# Patient Record
Sex: Male | Born: 1955
Health system: Southern US, Community
[De-identification: ages and names within clinical notes are randomized; demographics above are authoritative.]

## PROBLEM LIST (undated history)

## (undated) DIAGNOSIS — I10 Essential (primary) hypertension: Secondary | ICD-10-CM

## (undated) DIAGNOSIS — E785 Hyperlipidemia, unspecified: Secondary | ICD-10-CM

## (undated) DIAGNOSIS — I255 Ischemic cardiomyopathy: Secondary | ICD-10-CM

## (undated) DIAGNOSIS — J42 Unspecified chronic bronchitis: Secondary | ICD-10-CM

## (undated) DIAGNOSIS — G4733 Obstructive sleep apnea (adult) (pediatric): Secondary | ICD-10-CM

## (undated) DIAGNOSIS — I2510 Atherosclerotic heart disease of native coronary artery without angina pectoris: Secondary | ICD-10-CM

## (undated) DIAGNOSIS — G2581 Restless legs syndrome: Secondary | ICD-10-CM

## (undated) DIAGNOSIS — E119 Type 2 diabetes mellitus without complications: Secondary | ICD-10-CM

## (undated) DIAGNOSIS — I509 Heart failure, unspecified: Secondary | ICD-10-CM

## (undated) HISTORY — PX: TONSILLECTOMY AND ADENOIDECTOMY: SUR1326

## (undated) HISTORY — DX: Atherosclerotic heart disease of native coronary artery without angina pectoris: I25.10

## (undated) HISTORY — PX: OTHER SURGICAL HISTORY: SHX169

## (undated) HISTORY — DX: Obstructive sleep apnea (adult) (pediatric): G47.33

## (undated) HISTORY — PX: CYSTECTOMY: SUR359

## (undated) HISTORY — DX: Hyperlipidemia, unspecified: E78.5

## (undated) HISTORY — DX: Unspecified chronic bronchitis: J42

## (undated) HISTORY — DX: Ischemic cardiomyopathy: I25.5

## (undated) HISTORY — DX: Restless legs syndrome: G25.81

---

## 2005-11-14 ENCOUNTER — Inpatient Hospital Stay (HOSPITAL_COMMUNITY): Admission: EM | Admit: 2005-11-14 | Discharge: 2005-11-18 | Payer: Self-pay | Admitting: Emergency Medicine

## 2007-08-22 ENCOUNTER — Emergency Department (HOSPITAL_COMMUNITY): Admission: EM | Admit: 2007-08-22 | Discharge: 2007-08-22 | Payer: Self-pay | Admitting: Emergency Medicine

## 2011-01-03 NOTE — Cardiovascular Report (Signed)
NAMEWALFRED, BETTENDORF NO.:  [id number]   MEDICAL RECORD NO.:  [id number]          PATIENT TYPE:  INP   LOCATION:  2905                         FACILITY:  [facility]   PHYSICIAN:  Eduardo Osier. Sharyn Lull, M.D. DATE OF BIRTH:  11/04/1955   DATE OF PROCEDURE:  11/14/2005  DATE OF DISCHARGE:                              CARDIAC CATHETERIZATION   PROCEDURES:  1.  Left cardiac cath with selective left and right coronary angiography,      LV graphy via right groin using Judkins technique.  2.  Attempted PTCA to 100% occluded RCA.   INDICATIONS FOR PROCEDURE:  Mr. Costlow is a 55 year old white male with past  medical history significant for tobacco abuse, positive strong family  history of coronary artery disease.  He came to the ER complaining of  retrosternal chest tightness radiating to the throat grade 7/10 associated  with mild shortness of breath.  EKG done in the ER showed normal sinus  rhythm with ST-T wave abnormality in inferolateral leads and was noted to  have elevated CPK-MB and troponin I consistent with acute non-Q-wave  myocardial infarction.  Denies recent such episodes of chest pain but states  had similar chest pain in 2004 but did not seek any medical attention.  Due  to typical anginal chest pain, elevated CPK-MB and troponin I and ST-T wave  changes suggestive of non-Q-wave myocardial infarction, discussed with the  patient and his wife regarding emergency left cath, possible PTCA stenting;  its risks and benefits i.e. death, MI, stroke, need for emergency CABG, risk  of restenosis, local vascular complications, etc. and consented for the  procedure well.   PROCEDURE:  After obtaining informed consent, the patient was brought to the  cath lab and was placed on fluoroscopy table.  The right groin was prepped  and draped in usual fashion.  2% Xylocaine was used for local anesthesia in  the right groin.  With the help of thin-wall needle, a 7-French arterial  and  6-French venous sheaths were placed.  Both the sheaths were aspirated and  flushed.  Next, 6-French left Judkins catheter was advanced over the wire  under fluoroscopic guidance up to the ascending aorta.  Wire was pulled out,  the catheter was aspirated and connected to the manifold.  Catheter was  further advanced and engaged into left coronary ostium.  Multiple views of  the left system were taken.  Next, the catheter was disengaged and was  pulled out over the wire and was replaced with 6-French right Judkins  catheter which was advanced over the wire under fluoroscopic guidance up to  the ascending aorta.  Wire was pulled out, the catheter was aspirated and  connected to the manifold.  Catheter was further advanced and engaged into  right coronary ostium.  Multiple views of the right system were taken.  Next, the catheter was disengaged and was pulled out over the wire and was  replaced with 6-French pigtail catheter which was advanced over the wire  under fluoroscopic guidance to the ascending aorta.  Wire was pulled out,  the catheter was aspirated  and connected to the manifold.  Catheter was  further advanced across aortic valve into the LV.  LV pressures were  recorded.  Next, LV gram was done in 30 degree RAO position.  Post  angiographic pressures were recorded from LV and then pullback pressures  were recorded from the aorta.  There was no gradient across the aortic  valve.  The pigtail catheter was pulled out over the wire.  Sheaths were  aspirated and flushed.   FINDINGS:  LV showed inferobasal wall hypokinesia, EF of 50% approximately.  Left main was patent.  LAD has 20-30% proximal and 20-25% mid-sequential  stenosis.  Vessel is ectatic.  Diagonal one is small which is patent.  Left  circumflex has 40-50% proximal stenosis with large ectatic midportion.  OM-1  is very small.  OM-2 is large which is ectatic.  The proximal portion has 15-  20% proximal stenosis.  OM-3  is small which is patent.  RCA is 100% occluded  proximally which is filling by collaterals from the left system.  Distal RCA  also appears to be ectatic and filling from collaterals from RCA and from  septal branches from the LAD.   INTERVENTIONAL PROCEDURE:  Attempted to cross the totally occluded RCA using  multiple wires, i.e. 0.014 Hi-Torque floppy the choice PT, BMW support wire  and Crossit wire without success.  RCA lesion appears to be chronically  occluded and appears to be hard and fibrotic.  The patient received weight-  based heparin, Integrilin and Plavix during the procedure.  The patient  tolerated the procedure well.  There were no complications.  The patient was  transferred to recovery room in stable condition.           ______________________________  Eduardo Osier Sharyn Lull, M.D.     MNH/MEDQ  D:  11/14/2005  T:  11/16/2005  Job:  [id number]   cc:   Catheter Lab

## 2011-01-03 NOTE — Discharge Summary (Signed)
[name], [name] NO.:  [id number]   MEDICAL RECORD NO.:  [id number]          PATIENT TYPE:  INP   LOCATION:  2017                         FACILITY:  [facility]   PHYSICIAN:  Eduardo Osier. Sharyn Lull, M.D. DATE OF BIRTH:  01-Apr-1956   DATE OF ADMISSION:  11/14/2005  DATE OF DISCHARGE:  11/18/2005                                 DISCHARGE SUMMARY   ADMITTING DIAGNOSES:  1.  Acute non-Q-wave myocardial infarction.  2.  Mild congestive heart failure.  3.  Tobacco abuse.  4.  Elevated blood sugar, rule out diabetes mellitus.  5.  Positive family history of coronary artery disease.   DISCHARGE DIAGNOSES:  1.  Status post non-Q-wave myocardial infarction.  2.  Compensated congestive heart failure.  3.  Non-insulin-dependent diabetes mellitus, controlled by diet.  4.  Hypercholesterolemia.  5.  History ofinferior wall myocardial infarction.  6.  Resolving bronchitis.  7.  Tobacco abuse.  8.  Positive family history of coronary artery disease.   DISCHARGE HOME MEDICATIONS:  1.  Enteric-coated aspirin 325 mg 1 tablet daily.  2.  Plavix 75 mg 1 tablet daily with food.  3.  Toprol-XL 25 mg 1 tablet daily.  4.  Altace 2.5 mg 1 capsule daily.  5.  Lipitor 20 mg 1 tablet daily.  6.  Nitrostat 0.4 mg sublingual, use as directed.  7.  Avelox 400 mg 1 tablet daily for 5 more days.  8.  Chantix 0.5 mg 1 tablet twice daily for 4 days and then 1 mg twice      daily.   DIET:  Low-salt, low-cholesterol.  Patient has been advised to avoid  sweets__________  instructions have been given.  Follow up with me in 1  week. The patient will be referred to cardiac rehab as outpatient.   CONDITION AT DISCHARGE:  Stable.   BRIEF HISTORY AND HOSPITAL COURSE:  Mr. Blick is a 55 year old white male  with past medical history significant for tobacco abuse, strong family  history of coronary artery disease.  He came to the emergency room  complaining of retrosternal chest tightness radiating  to his throat, grade  7/10, associated with mild shortness of breath.  EKG done in the ER showed  normal sinus rhythm with ST-T wave changes in inferolateral leads and was  noted to have elevated CPK-MB and troponin I, consistent with acute non-Q-  wave MI.  Denies any recent such episodes of chest pain, but states he had  similar chest pain in 2004, but did not seek any medical attention.  Also  complains of cough with pleuritic chest pain.   PAST MEDICAL HISTORY:  As above.   PAST SURGICAL HISTORY:  He has spinal cyst resection and jaw cyst resection  approximately 12 years ago and gum surgery in the past; had testicular  surgery in the past and right foot surgery in the past.   ALLERGIES:  HE IS ALLERGIC TO CODEINE.   SOCIAL HISTORY:  He is married, has 1 child.  Smokes 1 pack per day for 30  years.  No history of alcohol abuse.  He  works as a Emergency planning/management officer.   FAMILY HISTORY:  Father is alive. He has coronary artery disease with  coronary artery bypass grafting.  One brother died of massive MI at the age  of 33.  Mother is alive; she has cancer.   PHYSICAL EXAMINATION:  GENERAL:  On exam, alert, awake, oriented x3 in no  acute distress.  VITAL SIGNS:  Blood pressure is 135/77, pulse was 99.  EYES:  Conjunctivae is pink.  NECK:  Supple.  No JVD.  LUNGS:  He has decreased breath sounds at the bases with occasional rales.  CARDIOVASCULAR EXAM:  S1 and S2 was normal.  There was a soft systolic  murmur and S3 gallop.  There was no pericardial or pleuritic rub.  EXTREMITIES:  There is no clubbing, cyanosis, or edema.   LABS:  His point-of-care CPK was 31.8.  CPK-MB was 31.8; repeat was 25.5.  Troponin I was 5.84 and 4.72.  His CK by lab was 326, MB of 29.9, relative  index 9.2.  Second CK was 306, MB of 26.1, relative index 8.5.  Third CK was  248, MB of 20.2, relative index 8.1.  Fourth CK was 257, MB 15.9, relative  index 6.2.  On April 1, CK is 108, MB 5.2, relative index 4.8.   Troponin I  was 8.12, 7.35, 8.44.  Today, troponin I is 1.87.  CK today is 53.  His  sodium was 140, potassium 3.9, chloride 109, bicarb 22, glucose of 118, BUN  6, creatinine 0.9.  Today his glucose is 97, BUN 9, creatinine 0.8,  potassium 3.7.  H&H is 14.7, hematocrit 42, white count is 12.5.   BRIEF HOSPITAL COURSE:  Patient was admitted, taken to the cath lab and  underwent left cardiac cath, selective left and right coronary angiography  and attempted PTCA to 100% occluded RCA which was felt to be chronically  occluded.  Patient tolerated the procedure well.  There were no  complications.  Patient did not have any further episodes of chest pain  during the hospital stay.  His groin is stable with no evidence of hematoma  or bruit.  Patient has been placed on cardiac rehab__________  patient has  been ambulating in the hallway without any problems.  Tobacco smoking  cessation consultation was obtained also, and the patient was started on  Chantix, which he has been tolerating well, and he will be discharged home  on the above medications and will be followed up in my office in 1 week.           ______________________________  Eduardo Osier. Sharyn Lull, M.D.     MNH/MEDQ  D:  11/18/2005  T:  11/19/2005  Job:  [id number]

## 2011-04-19 HISTORY — PX: CORONARY STENT PLACEMENT: SHX1402

## 2011-05-08 ENCOUNTER — Encounter: Payer: Self-pay | Admitting: Cardiology

## 2011-05-08 LAB — DIFFERENTIAL
Eosinophils Absolute: 0
Eosinophils Relative: 0
Lymphocytes Relative: 8 — ABNORMAL LOW
Lymphs Abs: 0.9
Neutrophils Relative %: 87 — ABNORMAL HIGH

## 2011-05-08 LAB — COMPREHENSIVE METABOLIC PANEL
ALT: 39
AST: 44 — ABNORMAL HIGH
Alkaline Phosphatase: 57
CO2: 25
Creatinine, Ser: 1.01
GFR calc Af Amer: >60
GFR calc non Af Amer: >60
Sodium: 134 — ABNORMAL LOW

## 2011-05-08 LAB — CBC
Hemoglobin: 16
MCHC: 34.2

## 2011-05-08 LAB — POCT CARDIAC MARKERS: CKMB, poc: <1 — ABNORMAL LOW

## 2011-05-08 LAB — URINALYSIS, ROUTINE W REFLEX MICROSCOPIC
Nitrite: NEGATIVE
pH: 6

## 2011-05-09 ENCOUNTER — Ambulatory Visit (INDEPENDENT_AMBULATORY_CARE_PROVIDER_SITE_OTHER): Payer: 59 | Admitting: Cardiology

## 2011-05-09 ENCOUNTER — Encounter: Payer: Self-pay | Admitting: Cardiology

## 2011-05-09 DIAGNOSIS — E785 Hyperlipidemia, unspecified: Secondary | ICD-10-CM

## 2011-05-09 DIAGNOSIS — I2510 Atherosclerotic heart disease of native coronary artery without angina pectoris: Secondary | ICD-10-CM

## 2011-05-09 DIAGNOSIS — R079 Chest pain, unspecified: Secondary | ICD-10-CM

## 2011-05-09 LAB — CBC WITH DIFFERENTIAL/PLATELET
HCT: 44.5 % (ref 39.0–52.0)
Hemoglobin: 15.6 g/dL (ref 13.0–17.0)
Lymphocytes Relative: 40 % (ref 12–46)
MCH: 32.2 pg (ref 26.0–34.0)
MCHC: 35.1 g/dL (ref 30.0–36.0)
MCV: 91.8 fL (ref 78.0–100.0)
Monocytes Absolute: 0.6 10*3/uL (ref 0.1–1.0)
Neutro Abs: 3.6 10*3/uL (ref 1.7–7.7)
Platelets: 144 10*3/uL — ABNORMAL LOW (ref 150–400)
WBC: 8.3 10*3/uL (ref 4.0–10.5)

## 2011-05-09 LAB — BASIC METABOLIC PANEL
BUN: 14 mg/dL (ref 6–23)
CO2: 19 mEq/L (ref 19–32)
Chloride: 105 mEq/L (ref 96–112)
Glucose, Bld: 87 mg/dL (ref 70–99)
Potassium: 3.9 mEq/L (ref 3.5–5.3)
Sodium: 140 mEq/L (ref 135–145)

## 2011-05-09 NOTE — Assessment & Plan Note (Signed)
I will defer to Dr. Lucila Maine.  I would suggest a goal LDL less than 70 and HDL greater than 50.

## 2011-05-09 NOTE — Assessment & Plan Note (Signed)
I had a long discussion with the patient and his wife. I would certainly expect him to have an abnormal stress perfusion study with a previously occluded right coronary. This makes interpretation of his stress perfusion study were difficult. He had known 40-50% circumflex disease 6 years ago. He is very strong risk factors including a strong family history. He presents with dyspnea on exertion which was the symptom prompting this evaluation and was new. This certainly could be an anginal equivalent. Given the abnormal stress test, known disease in the symptom cardiac catheterization is indicated. I reviewed at great length the risks and benefits of this procedure with the patient and his wife. I discussed with them at length the procedure which they now understand. They would like to proceed with this. He will continue to be aggressive risk factor modification.

## 2011-05-09 NOTE — Patient Instructions (Signed)
Your physician has requested that you have a cardiac catheterization. Cardiac catheterization is used to diagnose and/or treat various heart conditions. Doctors may recommend this procedure for a number of different reasons. The most common reason is to evaluate chest pain. Chest pain can be a symptom of coronary artery disease (CAD), and cardiac catheterization can show whether plaque is narrowing or blocking your heart's arteries. This procedure is also used to evaluate the valves, as well as measure the blood flow and oxygen levels in different parts of your heart. For further information please visit www.cardiosmart.org. Please follow instruction sheet, as given. Your physician recommends that you return for lab work in: today  

## 2011-05-09 NOTE — Progress Notes (Signed)
HPI The patient presents for evaluation of dyspnea. He has a history of coronary disease. He is new to this practice. He thinks his disease started in 2004 when he thinks he had his first heart attack but he did not seek medical care. In 2007 I was able to review hospital notes he was cared for by another practice in town. He was admitted with a myocardial infarction. Demonstrated 40-50% circumflex stenosis, 20% LAD stenosis and an occluded right coronary. This could not be opened. The patient was managed medically. He stopped smoking at that time. He actually has done relatively well. However, over the past year he has had increasing dyspnea with exertion. He'll get short of breath when he walks and carries something. The patient denies any new symptoms such as chest discomfort, neck or arm discomfort. There has been no new PND or orthopnea. There have been no reported palpitations, presyncope or syncope.  He did have a stress perfusion study. He was able to walk the treadmill but had ST segment this though I don't have these tracings. He was found to have inferior infarct with peri-infarct ischemia. EF was 46%.   Allergies  Allergen Reactions  . Lipitor (Atorvastatin Calcium)   . Niaspan (Niacin (Antihyperlipidemic))     Current Outpatient Prescriptions  Medication Sig Dispense Refill  . aspirin 81 MG tablet Take 81 mg by mouth daily.        . clopidogrel (PLAVIX) 75 MG tablet Take 75 mg by mouth daily.        Marland Kitchen ipratropium (ATROVENT HFA) 17 MCG/ACT inhaler Inhale 2 puffs into the lungs 4 (four) times daily.        . metoprolol tartrate (LOPRESSOR) 25 MG tablet Take 12.5 mg by mouth 2 (two) times daily.        . nitroGLYCERIN (NITROSTAT) 0.4 MG SL tablet Place 0.4 mg under the tongue as needed.        Marland Kitchen omega-3 acid ethyl esters (LOVAZA) 1 G capsule Take 2 g by mouth 2 (two) times daily.        . pantoprazole (PROTONIX) 40 MG tablet Take 40 mg by mouth daily.        . pramipexole (MIRAPEX) 1 MG  tablet Take 1 mg by mouth at bedtime.        . ramipril (ALTACE) 2.5 MG capsule Take 2.5 mg by mouth daily.        . simvastatin (ZOCOR) 20 MG tablet Take 20 mg by mouth as directed.          Past Medical History  Diagnosis Date  . ASCVD (arteriosclerotic cardiovascular disease)   . MI (myocardial infarction)     x2    Past Surgical History  Procedure Date  . Cardiac catheterization 2004  . Cardiac catheterization 2006    Total rt coronary occlusion, 20% block LAD    Family History  Problem Relation Age of Onset  . Lymphoma Mother   . Heart attack Father 2    History   Social History  . Marital Status: Married    Spouse Name: N/A    Number of Children: N/A  . Years of Education: N/A   Occupational History  . Not on file.   Social History Main Topics  . Smoking status: Former Games developer  . Smokeless tobacco: Not on file  . Alcohol Use: No  . Drug Use: No  . Sexually Active: Not on file   Other Topics Concern  . Not on file  Social History Narrative   Married with 1 grown daughter who is a Diplomatic Services operational officer for a school    ROS:  PHYSICAL EXAM BP 114/72  Pulse 60  Ht 5\' 10"  (1.778 m)  Wt 212 lb (96.163 kg)  BMI 30.42 kg/m2 GENERAL:  Well appearing HEENT:  Pupils equal round and reactive, fundi not visualized, oral mucosa unremarkable NECK:  No jugular venous distention, waveform within normal limits, carotid upstroke brisk and symmetric, no bruits, no thyromegaly LYMPHATICS:  No cervical, inguinal adenopathy LUNGS:  Clear to auscultation bilaterally BACK:  No CVA tenderness CHEST:  Unremarkable HEART:  PMI not displaced or sustained,S1 and S2 within normal limits, no S3, no S4, no clicks, no rubs, no murmurs ABD:  Flat, positive bowel sounds normal in frequency in pitch, no bruits, no rebound, no guarding, no midline pulsatile mass, no hepatomegaly, no splenomegaly EXT:  2 plus pulses throughout, no edema, no cyanosis no clubbing SKIN:  No rashes no  nodules NEURO:  Cranial nerves II through XII grossly intact, motor grossly intact throughout PSYCH:  Cognitively intact, oriented to person place and time   EKG:  Normal sinus rhythm, rate 53, old inferior infarct, lateral T wave inversion with old EKGs for comparison 03/27/11  ASSESSMENT AND PLAN

## 2011-05-12 ENCOUNTER — Telehealth: Payer: Self-pay | Admitting: Cardiovascular Disease

## 2011-05-12 NOTE — Telephone Encounter (Signed)
Pt wants to cancel cath please call

## 2011-05-12 NOTE — Telephone Encounter (Signed)
Spoke with pt wife, pt wants to cancel cath. No reason given. Cath lab made aware. [name]

## 2011-05-13 ENCOUNTER — Ambulatory Visit (HOSPITAL_COMMUNITY)
Admission: RE | Admit: 2011-05-13 | Discharge: 2011-05-13 | Disposition: A | Discharge status: Home / Self Care | Payer: 59 | Source: Ambulatory Visit | Attending: Emergency Medicine | Admitting: Emergency Medicine

## 2011-05-13 ENCOUNTER — Telehealth: Payer: Self-pay | Admitting: Cardiology

## 2011-05-13 ENCOUNTER — Emergency Department (HOSPITAL_COMMUNITY)
Admission: EM | Admit: 2011-05-13 | Discharge: 2011-05-13 | Disposition: A | Discharge status: Home / Self Care | Payer: 59 | Source: Home / Self Care | Attending: Emergency Medicine | Admitting: Emergency Medicine

## 2011-05-13 ENCOUNTER — Inpatient Hospital Stay (HOSPITAL_COMMUNITY)
Admission: AD | Admit: 2011-05-13 | Discharge: 2011-05-16 | DRG: 249 | Disposition: A | Discharge status: Home / Self Care | Payer: 59 | Source: Ambulatory Visit | Attending: Cardiovascular Disease | Admitting: Cardiovascular Disease

## 2011-05-13 DIAGNOSIS — R0789 Other chest pain: Secondary | ICD-10-CM | POA: Insufficient documentation

## 2011-05-13 DIAGNOSIS — I200 Unstable angina: Secondary | ICD-10-CM

## 2011-05-13 DIAGNOSIS — Z7902 Long term (current) use of antithrombotics/antiplatelets: Secondary | ICD-10-CM

## 2011-05-13 DIAGNOSIS — I2582 Chronic total occlusion of coronary artery: Secondary | ICD-10-CM | POA: Diagnosis present

## 2011-05-13 DIAGNOSIS — D696 Thrombocytopenia, unspecified: Secondary | ICD-10-CM | POA: Diagnosis present

## 2011-05-13 DIAGNOSIS — I2510 Atherosclerotic heart disease of native coronary artery without angina pectoris: Secondary | ICD-10-CM | POA: Insufficient documentation

## 2011-05-13 DIAGNOSIS — R112 Nausea with vomiting, unspecified: Secondary | ICD-10-CM | POA: Diagnosis present

## 2011-05-13 DIAGNOSIS — E7800 Pure hypercholesterolemia, unspecified: Secondary | ICD-10-CM | POA: Insufficient documentation

## 2011-05-13 DIAGNOSIS — R7309 Other abnormal glucose: Secondary | ICD-10-CM | POA: Diagnosis present

## 2011-05-13 DIAGNOSIS — I252 Old myocardial infarction: Secondary | ICD-10-CM

## 2011-05-13 DIAGNOSIS — J449 Chronic obstructive pulmonary disease, unspecified: Secondary | ICD-10-CM | POA: Diagnosis present

## 2011-05-13 DIAGNOSIS — Z7982 Long term (current) use of aspirin: Secondary | ICD-10-CM

## 2011-05-13 DIAGNOSIS — I498 Other specified cardiac arrhythmias: Secondary | ICD-10-CM | POA: Diagnosis present

## 2011-05-13 DIAGNOSIS — I517 Cardiomegaly: Secondary | ICD-10-CM | POA: Insufficient documentation

## 2011-05-13 DIAGNOSIS — I10 Essential (primary) hypertension: Secondary | ICD-10-CM | POA: Diagnosis present

## 2011-05-13 DIAGNOSIS — Z01812 Encounter for preprocedural laboratory examination: Secondary | ICD-10-CM

## 2011-05-13 DIAGNOSIS — Z79899 Other long term (current) drug therapy: Secondary | ICD-10-CM | POA: Insufficient documentation

## 2011-05-13 DIAGNOSIS — J4489 Other specified chronic obstructive pulmonary disease: Secondary | ICD-10-CM | POA: Diagnosis present

## 2011-05-13 LAB — COMPREHENSIVE METABOLIC PANEL
AST: 38 U/L — ABNORMAL HIGH (ref 0–37)
Albumin: 4 g/dL (ref 3.5–5.2)
Alkaline Phosphatase: 71 U/L (ref 39–117)
Chloride: 101 mEq/L (ref 96–112)
GFR calc non Af Amer: >60 mL/min (ref >60)
Total Bilirubin: 0.6 mg/dL (ref 0.3–1.2)
Total Protein: 7.3 g/dL (ref 6.0–8.3)

## 2011-05-13 LAB — CBC
HCT: 44.7 % (ref 39.0–52.0)
Hemoglobin: 15.7 g/dL (ref 13.0–17.0)
MCH: 31.8 pg (ref 26.0–34.0)
MCV: 90.5 fL (ref 78.0–100.0)
Platelets: 129 10*3/uL — ABNORMAL LOW (ref 150–400)
RBC: 4.94 MIL/uL (ref 4.22–5.81)
RDW: 13.1 % (ref 11.5–15.5)
WBC: 10.6 10*3/uL — ABNORMAL HIGH (ref 4.0–10.5)

## 2011-05-13 LAB — DIFFERENTIAL
Basophils Relative: 0 % (ref 0–1)
Monocytes Absolute: 0.7 10*3/uL (ref 0.1–1.0)

## 2011-05-13 LAB — CK TOTAL AND CKMB (NOT AT ARMC): Relative Index: 2.8 — ABNORMAL HIGH (ref 0.0–2.5)

## 2011-05-13 NOTE — Telephone Encounter (Signed)
Pt wife calling wanting to rs procedure for 6:30am on Thursday morning. Please return pt call to discuss further and/or confirm appt is scheduled.

## 2011-05-13 NOTE — Telephone Encounter (Signed)
Per Victorino Dike calling pt having cath on 9/27. Wife is asking should pt stop his ASA.

## 2011-05-13 NOTE — Telephone Encounter (Signed)
Patient and wife to  aware to continue ASA.

## 2011-05-13 NOTE — Telephone Encounter (Signed)
Patient's wife called yesterday and Cancell the Cardiac catheterization scheduled for Thursday 27 th at 6:30 Am. Today she called back to re-schedule procedure for the same day Thursday at 6:30 AM. I spoke with Selena Batten in the [facility] lab. Patient is back on scheduled, Same time. Patient and wife aware, he is to follow instructions given previously by nurse.

## 2011-05-14 LAB — BASIC METABOLIC PANEL
BUN: 9 mg/dL (ref 6–23)
Chloride: 102 mEq/L (ref 96–112)
Creatinine, Ser: 1.05 mg/dL (ref 0.50–1.35)
GFR calc Af Amer: >60 mL/min (ref >60)

## 2011-05-14 LAB — POCT I-STAT TROPONIN I: Troponin i, poc: 0.01 ng/mL (ref 0.00–0.08)

## 2011-05-14 LAB — CBC
HCT: 43.2 % (ref 39.0–52.0)
MCH: 31.9 pg (ref 26.0–34.0)
MCV: 90.8 fL (ref 78.0–100.0)
RDW: 13 % (ref 11.5–15.5)
WBC: 8.1 10*3/uL (ref 4.0–10.5)

## 2011-05-14 LAB — MRSA PCR SCREENING: MRSA by PCR: NEGATIVE

## 2011-05-14 LAB — LIPID PANEL
Total CHOL/HDL Ratio: 4.3 RATIO
VLDL: 23 mg/dL (ref 0–40)

## 2011-05-14 LAB — POCT I-STAT, CHEM 8
Calcium, Ion: 1.18 mmol/L (ref 1.12–1.32)
Chloride: 105 mEq/L (ref 96–112)
HCT: 49 % (ref 39.0–52.0)
TCO2: 19 mmol/L (ref 0–100)

## 2011-05-14 NOTE — Cardiovascular Report (Signed)
NAMEBRAINARD, HIGHFILL NO.:  [id number]  MEDICAL RECORD NO.:  [id number]  LOCATION:  XRAY                         FACILITY:  [facility]  PHYSICIAN:  Verne Carrow, MDDATE OF BIRTH:  1956/06/21  DATE OF PROCEDURE:  05/13/2011 DATE OF DISCHARGE:                           CARDIAC CATHETERIZATION   PRIMARY CARDIOLOGIST:  Rollene Rotunda, MD, [facility]  PROCEDURES PERFORMED: 1. Left heart catheterization 2. Selective coronary angiography. 3. Left ventricular angiogram.  OPERATOR:  Verne Carrow, MD  Arterial access site right radial artery.  INDICATIONS:  This is a 55 year old Caucasian male with a history of coronary artery disease, hypertension, hyperlipidemia who has had previous non-ST-elevation myocardial infarction.  His last admission in 2007 was for a non-ST-elevation myocardial infarction at which time he had a cardiac catheterization.  This cardiac catheterization showed a chronically occluded proximal right coronary artery with large aneurysmal segments throughout the left anterior descending artery and circumflex artery.  Intervention of the occluded right coronary artery was attempted by Dr. Sharyn Lull, however, attempts were unsuccessful.  The patient has been treated medically over the last 5 years.  He has recently been seen by Dr. Rollene Rotunda in the office.  He had a stress Myoview which showed inferior wall scar with mild peri-infarct ischemia. Overall, ejection fraction was 45%.  The patient is admitted to the hospital a day with an episode of severe substernal chest pressure.  His initial cardiac enzymes are negative.  The patient has had a recent viral gastroenteritis with diarrhea.  DETAILS OF PROCEDURE:  The patient was brought to the main cardiac catheterization laboratory after signing informed consent for the procedure.  An Freida Busman test was performed on the right wrist and was positive.  The right wrist was prepped and draped in  sterile fashion. Lidocaine 1% was used for local anesthesia.  A 5-French sheath was inserted into the right radial artery without difficulty.  Standard diagnostic catheters were used to perform selective coronary angiography.  A pigtail catheter was used to perform a left ventricular angiogram.  The patient tolerated the diagnostic portion of the procedure well.  There were no immediate complications.  The sheath was removed here in the cath lab and a Terumo hemostasis band was applied over the arteriotomy site.  HEMODYNAMIC FINDINGS:  Central aortic pressure 116/73.  Left ventricular pressure 118/9/13.  ANGIOGRAPHIC FINDINGS: 1. The left main coronary artery had no obstructive disease. 2. The left anterior descending was a large vessel that coursed to the     apex and gave off 2 diagonal branches.  The ostium of the left     anterior descending artery appeared to have a 40-50% stenosis.  The     proximal segment had several aneurysmal segments with     nonobstructive plaque.  The midvessel had a 40% stenosis.  The     distal vessel was patent and had mild plaque disease.  The first     diagonal was moderate size with an ostial 50% stenosis.  The second     diagonal was very small in caliber.  The left anterior descending     artery did wrap around the apex. 3. The circumflex artery had mild plaque  in its proximal segment.  The     midvessel had several aneurysmal segments.  The first obtuse     marginal branch of the circumflex artery had a large aneurysmal     segment in its proximal portion followed by a discrete 95% stenosis     which started just after the aneurysmal segment.  The distal     circumflex artery had diffuse plaque but no focally obstructive     lesions. 4. The right coronary artery has serial 99% lesions throughout its     proximal mid segment.  The mid vessel has 100% occlusion.  This is     known to be a chronically occluded vessel.  The distal vessel fills      from left-to-right collaterals. 5. Left ventricular angiogram was performed in the RAO projection and     showed mild left ventricular systolic dysfunction with ejection     fraction of 45%.  There is hypokinesis of the inferior wall.  IMPRESSION: 1. Triple-vessel coronary artery disease. 2. Chronically occluded right coronary artery (this is known since     catheterization in 2007). 3. Severe stenosis in the first obtuse marginal branch of the     circumflex artery involving a large aneurysmal segment. 4. Mild left ventricular systolic dysfunction.  RECOMMENDATIONS:  At this time I would recommend continued medical management with aspirin, Plavix, beta-blocker, statin and ACE inhibitor. I will review the films with my colleagues.  A percutaneous intervention of this obtuse marginal branch stenosis would be possible, however, the aneurysmal segment makes this intervention difficult.  Sizing a stent in this location will be very difficult.  Further plans to follow.     Verne Carrow, MD     CM/MEDQ  D:  05/13/2011  T:  05/13/2011  Job:  [id number]  cc:   Rollene Rotunda, MD, [facility]  Electronically Signed by Verne Carrow MD on 05/14/2011 01:49:41 PM

## 2011-05-15 ENCOUNTER — Encounter: Payer: Self-pay | Admitting: *Deleted

## 2011-05-15 LAB — BASIC METABOLIC PANEL
Calcium: 8.8 mg/dL (ref 8.4–10.5)
Creatinine, Ser: 1 mg/dL (ref 0.50–1.35)
GFR calc Af Amer: >60 mL/min (ref >60)

## 2011-05-15 LAB — CBC
MCH: 32.5 pg (ref 26.0–34.0)
MCV: 90.2 fL (ref 78.0–100.0)
Platelets: 117 10*3/uL — ABNORMAL LOW (ref 150–400)
RDW: 12.9 % (ref 11.5–15.5)

## 2011-05-15 LAB — POCT ACTIVATED CLOTTING TIME: Activated Clotting Time: 369 seconds

## 2011-05-15 NOTE — Cardiovascular Report (Signed)
NAMEMarland Ramos  [name], [name] NO.:  [id number]  MEDICAL RECORD NO.:  [id number]  LOCATION:  2501                         FACILITY:  [facility]  PHYSICIAN:  Verne Carrow, MDDATE OF BIRTH:  [birth year]  DATE OF PROCEDURE:  05/15/2011 DATE OF DISCHARGE:                           CARDIAC CATHETERIZATION   PRIMARY CARDIOLOGIST:  Rollene Rotunda, MD, [facility]  PRIMARY CARE PHYSICIAN:  Lucila Maine, MD  PROCEDURE PERFORMED:  Percutaneous transluminal coronary angioplasty with placement of a bare-metal stent in the first obtuse marginal branch of the circumflex artery.  OPERATOR:  Verne Carrow, MD  ARTERIAL ACCESS SITE:  Left radial artery.  INDICATIONS:  This is a 55 year old Caucasian male with a history of coronary artery disease who was admitted to the hospital after an episode of chest pain which was felt to be unstable angina.  Diagnostic catheterization was performed on May 13, 2011.  The patient was found to have a chronically occluded right coronary artery that filled from collateral vessels from the left system.  The left anterior descending artery had aneurysmal segments in the proximal midportion, but no focally obstructive lesions.  The proximal mid circumflex had severe aneurysmal segments leading into the obtuse marginal branch. This first obtuse marginal branch had an aneurysmal segment followed by a severe 95% stenosis.  I felt that this was most likely his culprit lesion.  Intervention was staged for today and they got multiple opinions on the best way to treat the stenosis.  Difficulty in this lesion was the fact that the stenosis was just after an aneurysmal segment.  There was a size mismatch between the aneurysmal segment in the distal vessel.  I had a long discussion with several members of my Cardiology Team as well as the patient and his family about appropriate treatment options.  We decided to pursue percutaneous intervention.   All risk of the intervention were explained to the patient and family including death, stroke, disruption of the aneurysmal segment in obtuse marginal branch leading to myocardial infarction and distal embolization.  The patient agreed to proceed.  PROCEDURE IN DETAIL:  The patient was brought to the main cardiac catheterization laboratory after signing informed consent for the procedure.  An Freida Busman test was performed on the left wrist and was positive.  The left wrist was prepped and draped in sterile fashion. Lidocaine 1% was used for local anesthesia.  A 6-French sheath was inserted into the left radial artery without difficulty.  Verapamil 3 mg was given through the sheath after insertion.  The patient was then given a bolus of Angiomax and a drip was started.  An XB 3.5 guiding catheter was used to selectively engage the left main artery.  When the ACT was greater than 200, we passed a cougar intracoronary wire down the length of the obtuse marginal branch beyond the aneurysmal segment and the severe stenosis.  A 2.5 x 12-mm balloon was used to predilate the lesion.  A 3.5 x 12-mm Integrity bare-metal stent was carefully positioned in the distal portion of the aneurysmal segment and across the severe stenosis.  This was deployed without difficulty.  A 4.0 x 8- mm noncompliant balloon was positioned in the proximal portion  of the stent and deployed at 14 atmospheres.  A 3.5 x 6-mm noncompliant balloon was positioned in the distal portion of the stented segment and deployed at 14 atmospheres.  The stenosis was taken from 95% to 0%.  There was an excellent angiographic result.  There was excellent flow down the vessel.  There were no immediate complications.  The sheath was removed from the left radial artery here in the cath lab and a Terumo hemostasis band was applied over the arteriotomy site.  The patient was taken to the recovery room in stable condition.  IMPRESSION:  Successful  percutaneous coronary intervention with placement of a bare-metal stent in the first obtuse marginal branch of the circumflex artery.  RECOMMENDATIONS:  The patient will be continued on aspirin 81 mg once daily, Effient 10 mg once daily, and Crestor.  We will not start a beta blocker given his bradycardia.  Of note, the patient was loaded with 60 mg of Effient here in the cath lab.  Plavix will be stopped.     Verne Carrow, MD     CM/MEDQ  D:  05/15/2011  T:  05/15/2011  Job:  [id number]  cc:   Rollene Rotunda, MD, [facility] Lucila Maine, MD  Electronically Signed by Verne Carrow MD on 05/15/2011 11:56:50 AM

## 2011-05-16 LAB — CBC
MCHC: 35.4 g/dL (ref 30.0–36.0)
RDW: 12.9 % (ref 11.5–15.5)

## 2011-05-16 LAB — BASIC METABOLIC PANEL
BUN: 8 mg/dL (ref 6–23)
GFR calc Af Amer: >60 mL/min (ref >60)
GFR calc non Af Amer: >60 mL/min (ref >60)
Potassium: 3.6 mEq/L (ref 3.5–5.1)
Sodium: 138 mEq/L (ref 135–145)

## 2011-05-16 NOTE — Discharge Summary (Addendum)
[name], [name] NO.:  [id number]  MEDICAL RECORD NO.:  [id number]  LOCATION:  2501                         FACILITY:  [facility]  PHYSICIAN:  Verne Carrow, MDDATE OF BIRTH:  [birth year]  DATE OF ADMISSION:  05/13/2011 DATE OF DISCHARGE:  05/16/2011                              DISCHARGE SUMMARY   DISCHARGE DIAGNOSES: 1. Unstable angina with catheterization noted below. 2. Coronary artery disease.     a.     Status post percutaneous transluminal coronary angioplasty      with placement of bare-metal stent to the first obtuse marginal      branch of the circumflex artery on May 15, 2011.     b.     History of myocardial infarction in 2004, did not seek      medical care at that time.     c.     Catheterization in 2007 revealing total occluded right      coronary artery with left-to-right collaterals with failed      angioplasty on that vessel. 3. Left ventricular dysfunction with ejection fraction of 45% by     catheterization this admission. 4. Hypertension. 5. Chronic obstructive pulmonary disease/asthma. 6. Hyperlipidemia. 7. Elevated glucose without history of diabetes mellitus, A1c is 6.3. 8. Thrombocytopenia with discharge platelet count of 119.  HOSPITAL COURSE:  [name] is a 55 year old gentleman with a history of coronary artery disease as outlined above who has had increasing dyspnea with exertion.  He had a stress test several weeks showing an EF of 46% with inferior infarcts and peri-infarct ischemia.  He was referred to Dr. Antoine Poche who recommended cardiac catheterization on May 09, 2011, but the patient refused at that time.  While at work and having some diarrhea, the patient developed severe chest pain in the center of his chest with radiation to his jaw, very similar with what he experienced with his prior MI.  First set of cardiac markers and EKG were unremarkable.  The patient's symptoms were felt concerning  for unstable angina.  It was also thought that he had some viral gastroenteritis for which supportive treatment was undertaken.  The patient underwent diagnostic catheterization on May 13, 2011, demonstrating triple-vessel CAD with highly occluded RCA as well as severe stenosis in the first OM branch of the circumflex involving a large aneurysmal segment.  Dr. Clifton James recommended continued medical management initially with aspirin, Plavix, beta-blocker, stating, and ACE.  He first wanted to review the films before intervening given that the aneurysmal segment of the vessel made the intervention difficult. The patient was ultimately brought back to the Cath Lab on the 27th and underwent  successful PTA/bare-metal stent placement to the first OM. The patient tolerated the procedure well.  He was noted to have bradycardia, so beta-blocker was held.  He was changed from Plavix to Effient with a 6-mg load in the Cath Lab.  The patient is doing well today without complaints.  Dr. Clifton James has seen and examined him and feels he is stable for discharge.  DISCHARGE LABS:  WBC 8.7, hemoglobin 15.1, hematocrit 42.6, and platelet count 119.  Sodium 138, potassium 3.6, chloride 105, CO2 of 24, glucose 117,  BUN 8, and creatinine 0.86.  A1c 6.3.  Total cholesterol 130, triglycerides 116, HDL 30, and LDL 77.  STUDIES: 1. Chest x-ray on May 13, 2011, showed stable chest x-ray with     mild cardiomegaly.  No active lung disease. 2. Cardiac catheterization on May 14, 2011, and May 15, 2011, please see full report for details as well as HPI for     summary.  DISCHARGE MEDICATIONS: 1. Effient 10 mg daily. 2. Crestor 5 mg on Tuesday, Thursday, and Saturday.  The patient has a     history of myalgias. 3. Lipitor. 4. Aspirin 81 mg every morning. 5. Atrovent 2 puffs inhaled q.6 h. p.r.n. shortness of breath. 6. Lovaza 1 g 2 capsules b.i.d. 7. Nitro sublingual 0.4 mg every 5  minutes as needed up to 3 doses for     chest pain. 8. Protonix 40 mg nightly. 9. Pramipexole 1 mg 1/2 tablet b.i.d. 10.Ramipril 2.5 nightly.  Please note his Effient was substituted for Plavix this admission and beta-blocker was not continued because of bradycardia.  DISPOSITION:  [name] will be discharged in stable condition to home. He may return to work on May 23, 2011.  He is not to lift anything over 5 pounds for 1 week, drive for 2 days, or participate in sexual activity for 1 week.  He should follow a low-sodium, heart-healthy diet and call or return if he notices any pain, swelling, bleeding, or pus from his cath site.  He will followup with Dr. Antoine Poche as an outpatient and our office will call him with this appointment.  He is also to follow up with his PCP regarding further monitoring of his blood sugars given his impaired glucose tolerance noted here in the hospital.  Duration of discharge encounter greater than 30 minutes including physical and PA time.     Ronie Spies, P.A.C.   ______________________________ Verne Carrow, MD    DD/MEDQ  D:  05/16/2011  T:  05/16/2011  Job:  [id number]  cc:   Rollene Rotunda, MD, [facility]  Electronically Signed by Verne Carrow MD on 05/16/2011 03:07:43 PM Electronically Signed by Ronie Spies  on 05/21/2011 03:47:42 PM

## 2011-05-23 ENCOUNTER — Encounter: Payer: Self-pay | Admitting: Cardiology

## 2011-05-24 DIAGNOSIS — G4733 Obstructive sleep apnea (adult) (pediatric): Secondary | ICD-10-CM

## 2011-05-24 HISTORY — DX: Obstructive sleep apnea (adult) (pediatric): G47.33

## 2011-05-28 ENCOUNTER — Telehealth: Payer: Self-pay | Admitting: Cardiology

## 2011-05-28 NOTE — Telephone Encounter (Signed)
Pt having dental cleaning, does he need an abx? appt tomorrow am, needs call today, cell [phone] uses  [facility] Levan [phone]

## 2011-05-29 NOTE — Telephone Encounter (Signed)
I spoke with Kennon Rounds and pt does not need antibiotic prior to dental cleaning. Pt had stent placed last month. Family is aware. Mylo Red RN

## 2011-06-01 NOTE — H&P (Signed)
[name], [name] NO.:  [id number]  MEDICAL RECORD NO.:  [id number]  LOCATION:  XRAY                         FACILITY:  [facility]  PHYSICIAN:  Bevelyn Buckles. Laterrance Nauta, MDDATE OF BIRTH:  1956-02-19  DATE OF ADMISSION:  05/13/2011 DATE OF DISCHARGE:                             HISTORY & PHYSICAL   PRIMARY CARE PHYSICIAN:  Lucila Maine, MD  CARDIOLOGIST:  Rollene Rotunda, MD, [facility]  REASON FOR ADMISSION:  Unstable angina.  [name] is a very pleasant 55 year old male with a history of hypertension, COPD, hyperlipidemia.  He also has a history of coronary artery disease.  Apparently, he had a heart attack in 2004, but did not seek medical care.  He underwent cardiac catheterization in 2007 by Dr. Sharyn Lull.  This showed an EF of 50%.  LAD had 20-30% proximal stenosis and 20-25% mid stenosis.  Left circumflex had a 40-50% proximal stenosis.  The RCA was totally occluded with left-to-right collaterals. Angioplasty was attempted, but the artery could not be opened.  He was started on Plavix.  He stopped smoking at that time.  He actually has done relatively well.  However, over the past year, he has had increasing dyspnea with exertion.  He had a stress test a few weeks ago, showed an EF of 46% with an inferior infarct and peri-infarct ischemia.  He went to see his primary care physician who then referred him to Dr. Antoine Poche.  Dr. Antoine Poche saw him on September 21 and recommended diagnostic catheterization.  He refused at that time. Today, he was at work and was having some diarrhea, then developed severe chest pain in the center part of his chest with radiation to his jaw, and this is very similar to what he experienced before with his myocardial infarction.  He came to the emergency room.  He was started on nitroglycerin with relief of his pain.  EKG and first set of cardiac markers have been negative.  He is now comfortable.  REVIEW OF SYSTEMS:  He denies any  fevers or chills.  He has had some abdominal discomfort and diarrhea.  Has also had some nausea, but no vomiting.  No bright red blood per rectum.  No melena.  No focal neurologic signs.  No claudication.  He does not smoke anymore.  He does note that he has trouble sleeping.  His wife says he snores very loudly. Remainder review of systems is negative except for HPI and problem list.  PROBLEM LIST: 1. Coronary artery disease as described above. 2. Hypertension. 3. COPD/asthma. 4. Hyperlipidemia.  CURRENT MEDICATIONS: 1. Aspirin 81 a day. 2. Plavix 75 a day. 3. Atrovent 2 puffs 4 times a day. 4. Metoprolol 12.5 b.i.d. 5. Nitroglycerin p.r.n. 6. Lovaza 2 grams twice a day. 7. Protonix 40 a day. 8. Mirapex 1 tablet a day. 9. Altace 2.5 a day. 10.Simvastatin 20 a day.  ALLERGIES:  LIPITOR and NIASPAN.  SOCIAL HISTORY:  He is married.  He works as a Teacher, early years/pre.  He has a history of tobacco, but no longer smokes.  Denies drug use.  FAMILY HISTORY:  Mother had lymphoma.  Father had myocardial infarction at age 48.  PHYSICAL EXAMINATION:  GENERAL:  He is in no acute distress. Respirations unlabored. VITAL SIGNS:  Blood pressure is 122/80, heart rate 60, satting 95% on 2 liters. HEENT:  Normal. NECK:  Thick and supple.  No obvious JVD.  Carotids are 2+ bilaterally without bruits.  There is no lymphadenopathy or thyromegaly. CARDIAC:  PMI is nondisplaced.  He is regular with distant heart sounds. No obvious murmurs. LUNGS:  Clear. ABDOMEN:  Soft, nontender, nondistended.  No hepatosplenomegaly.  No bruits.  No masses.  Good bowel sounds. EXTREMITIES:  Warm with no cyanosis, clubbing or edema.  Good pulses. No rash. NEURO:  Alert and oriented x3.  Cranial nerves II-XII are intact.  Moves all fours without difficulty.  Affect is pleasant.  EKG shows sinus rhythm with no ST-T wave abnormalities.  Chest x-ray is pending.  INR 0.95.  Troponin is normal.  CK shows a CK of  200, MB of to 5.6.  Sodium 134, potassium 3.8, glucose of 93, BUN 12, creatinine 0.91. White count 10.6, hemoglobin 15.7, platelets of 129.  ASSESSMENT: 1. Chest pain radiating to the jaw, concerning for unstable angina. 2. Coronary artery disease as described above with totally occluded     right coronary artery. 3. Hyperlipidemia. 4. Probable obstructive sleep apnea. 5. Probable viral gastroenteritis.  PLAN/DISCUSSION:  His chest pain is concerning for unstable angina, particularly in light of his previous coronary artery disease and progressive dyspnea.  We have discussed the risks and indications for cath and we will proceed with that today.  He will also need an outpatient sleep study to further evaluate for sleep apnea.  Otherwise, we will continue his current medications at this point.     Bevelyn Buckles. Reinaldo Helt, MD     DRB/MEDQ  D:  05/13/2011  T:  05/13/2011  Job:  [id number]  cc:   Rollene Rotunda, MD, [facility] Lucila Maine, MD  Electronically Signed by Arvilla Meres MD on 06/01/2011 02:46:48 PM

## 2011-06-05 ENCOUNTER — Encounter: Payer: Self-pay | Admitting: Cardiology

## 2011-06-05 ENCOUNTER — Ambulatory Visit (INDEPENDENT_AMBULATORY_CARE_PROVIDER_SITE_OTHER): Payer: 59 | Admitting: Cardiology

## 2011-06-05 DIAGNOSIS — R7309 Other abnormal glucose: Secondary | ICD-10-CM

## 2011-06-05 DIAGNOSIS — I2510 Atherosclerotic heart disease of native coronary artery without angina pectoris: Secondary | ICD-10-CM

## 2011-06-05 DIAGNOSIS — R739 Hyperglycemia, unspecified: Secondary | ICD-10-CM

## 2011-06-05 DIAGNOSIS — E785 Hyperlipidemia, unspecified: Secondary | ICD-10-CM

## 2011-06-05 DIAGNOSIS — G4730 Sleep apnea, unspecified: Secondary | ICD-10-CM

## 2011-06-05 DIAGNOSIS — G4733 Obstructive sleep apnea (adult) (pediatric): Secondary | ICD-10-CM | POA: Insufficient documentation

## 2011-06-05 NOTE — Progress Notes (Signed)
HPI The patient presents for followup of his known coronary disease. I saw him in the office and had been scheduled for catheterization. However, he presented prior to this with chest discomfort. He had cardiac catheterization which demonstrated a known occluded right coronary artery. However, he was also found to have high-grade stenosis of an obtuse marginal. He underwent drug-eluting stent placement. Since that time he has done quite well. In retrospect he was having some chest discomfort which is no longer getting. He denies any shortness of breath, PND or orthopnea. He is able to do lifting and carrying without bringing on any chest discomfort. Is not having any palpitations, presyncope or syncope. He has had no weight gain or edema.  Allergies  Allergen Reactions  . Lipitor (Atorvastatin Calcium)   . Niaspan (Niacin (Antihyperlipidemic))     Current Outpatient Prescriptions  Medication Sig Dispense Refill  . aspirin 81 MG tablet Take 81 mg by mouth daily.        . CRESTOR 5 MG tablet 1 TAB AT NIGHT ON TUES THURS AND SAT      . ipratropium (ATROVENT HFA) 17 MCG/ACT inhaler Inhale 2 puffs into the lungs 4 (four) times daily.        . nitroGLYCERIN (NITROSTAT) 0.4 MG SL tablet Place 0.4 mg under the tongue as needed.        Marland Kitchen omega-3 acid ethyl esters (LOVAZA) 1 G capsule Take 2 g by mouth 2 (two) times daily.        . pantoprazole (PROTONIX) 40 MG tablet Take 40 mg by mouth daily.        . pramipexole (MIRAPEX) 1 MG tablet Take 1 mg by mouth at bedtime.        . prasugrel (EFFIENT) 10 MG TABS Take by mouth.        . ramipril (ALTACE) 2.5 MG capsule Take 2.5 mg by mouth daily.          Past Medical History  Diagnosis Date  . ASCVD (arteriosclerotic cardiovascular disease)   . MI (myocardial infarction)     x2  . Hyperlipidemia     Past Surgical History  Procedure Date  . Cardiac catheterization 2004  . Cardiac catheterization 2006    Total rt coronary occlusion, 20% block LAD  .  Tonsillectomy and adenoidectomy   . Cystectomy     Spine and jaw    ROS:  As stated in the HPI and negative for all other systems.  PHYSICAL EXAM BP 120/75  Pulse 51  Ht 5\' 10"  (1.778 m)  Wt 210 lb (95.255 kg)  BMI 30.13 kg/m2 GENERAL:  Well appearing HEENT:  Pupils equal round and reactive, fundi not visualized, oral mucosa unremarkable NECK:  No jugular venous distention, waveform within normal limits, carotid upstroke brisk and symmetric, no bruits, no thyromegaly LYMPHATICS:  No cervical, inguinal adenopathy LUNGS:  Clear to auscultation bilaterally BACK:  No CVA tenderness CHEST:  Unremarkable HEART:  PMI not displaced or sustained,S1 and S2 within normal limits, no S3, no S4, no clicks, no rubs, no murmurs ABD:  Flat, positive bowel sounds normal in frequency in pitch, no bruits, no rebound, no guarding, no midline pulsatile mass, no hepatomegaly, no splenomegaly EXT:  2 plus pulses throughout, no edema, no cyanosis no clubbing SKIN:  No rashes no nodules NEURO:  Cranial nerves II through XII grossly intact, motor grossly intact throughout PSYCH:  Cognitively intact, oriented to person place and time  EKG:  Normal sinus rhythm, rate 51,  old inferior infarct, inferior T-wave inversion, early transition in lead V2, no change from previous  ASSESSMENT AND PLAN

## 2011-06-05 NOTE — Assessment & Plan Note (Signed)
His hemoglobin A1c was 6.3 did not spell. We talked about weight loss and diet. I will defer other followup to his primary provider.

## 2011-06-05 NOTE — Assessment & Plan Note (Signed)
He is due to have sleep study.

## 2011-06-05 NOTE — Assessment & Plan Note (Signed)
He is symptom free post PCI and he will continue the meds as listed.

## 2011-06-05 NOTE — Assessment & Plan Note (Signed)
He was at acceptable lipid levels in the hospital and he will continue the meds as listed.

## 2011-06-05 NOTE — Patient Instructions (Signed)
Follow up in 4 months with Dr Hochrein.  You will receive a letter in the mail 2 months before you are due.  Please call us when you receive this letter to schedule your follow up appointment.  The current medical regimen is effective;  continue present plan and medications.   

## 2011-06-13 ENCOUNTER — Other Ambulatory Visit: Payer: Self-pay | Admitting: Cardiology

## 2011-06-13 MED ORDER — PRASUGREL HCL 10 MG PO TABS: 10.0000 mg | ORAL_TABLET | Freq: Every day | ORAL | Status: DC

## 2011-06-25 ENCOUNTER — Encounter: Payer: Self-pay | Admitting: Pulmonary Disease

## 2011-06-25 ENCOUNTER — Ambulatory Visit (INDEPENDENT_AMBULATORY_CARE_PROVIDER_SITE_OTHER): Payer: 59 | Admitting: Pulmonary Disease

## 2011-06-25 VITALS — BP 130/90 | HR 67 | Temp 97.5°F | Ht 70.0 in | Wt 216.2 lb

## 2011-06-25 DIAGNOSIS — G2581 Restless legs syndrome: Secondary | ICD-10-CM

## 2011-06-25 DIAGNOSIS — G4730 Sleep apnea, unspecified: Secondary | ICD-10-CM

## 2011-06-25 HISTORY — DX: Restless legs syndrome: G25.81

## 2011-06-25 NOTE — Progress Notes (Deleted)
  Subjective:    Patient ID: [name], male    DOB: [birth year], 55 y.o.   MRN: [id number]  HPI    Review of Systems     Objective:   Physical Exam        Assessment & Plan:

## 2011-06-25 NOTE — Assessment & Plan Note (Signed)
Explained to him how sleep disruption from sleep apnea can contribute to restless legs.  Will need to re-assess once he is established on therapy for sleep apnea.

## 2011-06-25 NOTE — Progress Notes (Signed)
Chief Complaint  Patient presents with  . Advice Only    Pt had sleep study 05/2011 and was only advised he had sleep apnea. Pt states  he can go to sleep easy but has trouble staying asleep, snoring    History of Present Illness:  CC: [name] is a 55 y.o. male evaluated for obstructive sleep apnea.  He was in hospital recently for coronary stenting.  He was noted to have trouble with his breathing while a sleep.  This was confirmed with his wife who also noted that he snores and stops breathing while asleep.  As a result he had sleep test from May 24, 2011 at [facility] 59.7, SpO2 low 82%.  He was then referred for further evaluation.  He goes to bed at 9 pm.  He falls asleep quickly.  He wakes up after several hours, and then has trouble falling back to sleep.  He will eventually get out of bed between 6 and 11 am, depending on his work schedule.  He does not use anything to help him sleep or stay awake.  He feels tired in the morning, but denies headaches.  He has been using mirapex for years to help with his restless leg syndrome.  He can not sleep w/o using mirapex, and this has helped considerably.  The patient denies sleep walking, sleep talking, bruxism, or nightmares.  The patient denies sleep hallucinations, sleep paralysis, or cataplexy.  His Epworth score is 16 out of 24.  Past Medical History  Diagnosis Date  . ASCVD (arteriosclerotic cardiovascular disease)   . MI (myocardial infarction)     x2  . Hyperlipidemia   . OSA (obstructive sleep apnea) 05/24/11  . Restless leg syndrome 06/25/2011    Past Surgical History  Procedure Date  . Tonsillectomy and adenoidectomy   . Cystectomy     Spine and jaw  . Lesion removed from foot     right foot  . Coronary stent placement 04/2011    Current Outpatient Prescriptions on File Prior to Visit  Medication Sig Dispense Refill  . aspirin 81 MG tablet Take 81 mg by mouth daily.          . CRESTOR 5 MG tablet 1 TAB AT NIGHT ON TUES THURS AND SAT      . ipratropium (ATROVENT HFA) 17 MCG/ACT inhaler Inhale 2 puffs into the lungs 4 (four) times daily.        . nitroGLYCERIN (NITROSTAT) 0.4 MG SL tablet Place 0.4 mg under the tongue as needed.        Marland Kitchen omega-3 acid ethyl esters (LOVAZA) 1 G capsule Take 2 g by mouth 2 (two) times daily.        . pantoprazole (PROTONIX) 40 MG tablet Take 40 mg by mouth daily.        . pramipexole (MIRAPEX) 1 MG tablet Take 1 mg by mouth at bedtime.        . prasugrel (EFFIENT) 10 MG TABS Take 1 tablet (10 mg total) by mouth daily.  30 tablet  12  . ramipril (ALTACE) 2.5 MG capsule Take 2.5 mg by mouth daily.          Allergies  Allergen Reactions  . Lipitor (Atorvastatin Calcium)   . Niaspan (Niacin (Antihyperlipidemic))     family history includes Heart attack (age of onset:51) in his brother; Heart attack (age of onset:60) in his father; and Lymphoma in his mother.   reports that he  quit smoking about 5 years ago. His smoking use included Cigarettes. He has a 52.5 pack-year smoking history. He does not have any smokeless tobacco history on file. He reports that he does not drink alcohol or use illicit drugs.   Blood pressure 130/90, pulse 67, temperature 97.5 F (36.4 C), temperature source Oral, height 5\' 10"  (1.778 m), weight 216 lb 3.2 oz (98.068 kg), SpO2 93.00%.  Physical Exam:  General - Obese HEENT - PERRLA, EOMI, no sinus tenderness, no oral exudate, MP 3, no LAN, no thyromegaly Cardiac - s1s2 regular, no murmur Chest - no wheeze/rales/dullness Abdomen - soft, nontender Extremities - no e/c/c Neurologic - normal strength, CN intact Skin - no rashes Psychiatric - normal mood, behavior  Assessment/Plan:  OSA (obstructive sleep apnea) He has severe sleep apnea.  I have reviewed his sleep test results with the patient.  Explained how sleep apnea can affect the patient's health.  Driving precautions and importance of weight  loss were discussed.  Treatment options for sleep apnea were reviewed.  Will proceed with CPAP titration study in sleep lab.  Will call him with results and set up CPAP.  Explained techniques to adjust to using CPAP mask.  Restless leg syndrome Explained to him how sleep disruption from sleep apnea can contribute to restless legs.  Will need to re-assess once he is established on therapy for sleep apnea.     Outpatient Encounter Prescriptions as of 06/25/2011  Medication Sig Dispense Refill  . aspirin 81 MG tablet Take 81 mg by mouth daily.        . CRESTOR 5 MG tablet 1 TAB AT NIGHT ON TUES THURS AND SAT      . ipratropium (ATROVENT HFA) 17 MCG/ACT inhaler Inhale 2 puffs into the lungs 4 (four) times daily.        . nitroGLYCERIN (NITROSTAT) 0.4 MG SL tablet Place 0.4 mg under the tongue as needed.        Marland Kitchen omega-3 acid ethyl esters (LOVAZA) 1 G capsule Take 2 g by mouth 2 (two) times daily.        . pantoprazole (PROTONIX) 40 MG tablet Take 40 mg by mouth daily.        . pramipexole (MIRAPEX) 1 MG tablet Take 1 mg by mouth at bedtime.        . prasugrel (EFFIENT) 10 MG TABS Take 1 tablet (10 mg total) by mouth daily.  30 tablet  12  . ramipril (ALTACE) 2.5 MG capsule Take 2.5 mg by mouth daily.          [name] 06/25/2011, 4:27 PM

## 2011-06-25 NOTE — Patient Instructions (Signed)
Will arrange for sleep test Will call to schedule follow up after sleep test reviewed 

## 2011-06-25 NOTE — Assessment & Plan Note (Signed)
He has severe sleep apnea.  I have reviewed his sleep test results with the patient.  Explained how sleep apnea can affect the patient's health.  Driving precautions and importance of weight loss were discussed.  Treatment options for sleep apnea were reviewed.  Will proceed with CPAP titration study in sleep lab.  Will call him with results and set up CPAP.  Explained techniques to adjust to using CPAP mask.

## 2011-07-17 ENCOUNTER — Ambulatory Visit (HOSPITAL_BASED_OUTPATIENT_CLINIC_OR_DEPARTMENT_OTHER): Payer: 59 | Attending: Pulmonary Disease | Admitting: Radiology

## 2011-07-17 VITALS — Ht 70.0 in | Wt 216.0 lb

## 2011-07-17 DIAGNOSIS — G4730 Sleep apnea, unspecified: Secondary | ICD-10-CM

## 2011-07-17 DIAGNOSIS — G4733 Obstructive sleep apnea (adult) (pediatric): Secondary | ICD-10-CM

## 2011-07-30 ENCOUNTER — Telehealth: Payer: Self-pay | Admitting: Pulmonary Disease

## 2011-07-30 NOTE — Telephone Encounter (Signed)
Spoke with pt. He states that he would like to get get started on CPAP before the end of the year due to insurance purposes. He is scheduled for "sleep test" on 08/07/11. Will forward to VS so he is aware and can keep an eye out for these results. Thanks

## 2011-08-07 ENCOUNTER — Ambulatory Visit (HOSPITAL_BASED_OUTPATIENT_CLINIC_OR_DEPARTMENT_OTHER): Payer: 59 | Attending: Pulmonary Disease

## 2011-08-07 VITALS — Ht 70.0 in | Wt 216.0 lb

## 2011-08-07 DIAGNOSIS — Z79899 Other long term (current) drug therapy: Secondary | ICD-10-CM | POA: Insufficient documentation

## 2011-08-07 DIAGNOSIS — G4733 Obstructive sleep apnea (adult) (pediatric): Secondary | ICD-10-CM | POA: Insufficient documentation

## 2011-08-07 DIAGNOSIS — Z7982 Long term (current) use of aspirin: Secondary | ICD-10-CM | POA: Insufficient documentation

## 2011-08-07 DIAGNOSIS — G4737 Central sleep apnea in conditions classified elsewhere: Secondary | ICD-10-CM | POA: Insufficient documentation

## 2011-08-07 DIAGNOSIS — Z9989 Dependence on other enabling machines and devices: Secondary | ICD-10-CM

## 2011-08-08 ENCOUNTER — Telehealth: Payer: Self-pay | Admitting: Pulmonary Disease

## 2011-08-08 DIAGNOSIS — G4733 Obstructive sleep apnea (adult) (pediatric): Secondary | ICD-10-CM

## 2011-08-08 NOTE — Telephone Encounter (Signed)
Phone message signed in error.  Will forward to VS again.

## 2011-08-08 NOTE — Telephone Encounter (Signed)
Error.[name] ° °

## 2011-08-08 NOTE — Telephone Encounter (Signed)
Pt calling again in reference to previous message says it's imperative that he gets this matter straightened out before the end of December he can be reached at [phone].[name]

## 2011-08-08 NOTE — Telephone Encounter (Signed)
Spoke with the pt and he states he had sleep study last night and wants the results. He states he has told Dr. Craige Cotta that all of this testing and cpap set up needs to be done before the end of the year because he has already sent $6000 this year on this and is this is not completed but the end of the year then he is just going to have to not have a cpap. I advised I will send a message to Dr. Craige Cotta to make him aware. Please advise. Carron Curie, CMA

## 2011-08-11 NOTE — Telephone Encounter (Signed)
LMOMTCB x 1 

## 2011-08-11 NOTE — Telephone Encounter (Signed)
Please inform patient that I have reviewed his study from 08/07/11.  I have sent order to [facility] to start him with BPAP 19/15 cm H2O.  He will need to have ONO done with this set up, and I will call him with results of ONO.  Please schedule ROV 2 months after set up.

## 2011-08-11 NOTE — Procedures (Signed)
NAMEMarland Kitchen  GAUGE, WINSKI NO.:  [id number]  MEDICAL RECORD NO.:  [id number]          PATIENT TYPE:  OUT  LOCATION:  SLEEP CENTER                 FACILITY[facility]  [facility]  PHYSICIAN:  Coralyn Helling, MD        DATE OF BIRTH:  July 10, 1956  DATE OF STUDY:  08/11/2011                           NOCTURNAL POLYSOMNOGRAM  REFERRING PHYSICIAN:  Coralyn Helling, MD  FACILITY:  [facility].  REFERRING PHYSICIAN:  Coralyn Helling, MD  INDICATION FOR STUDY:  [name] is a 55 year old male who has history of coronary artery disease.  He had an overnight polysomnogram on July 17, 2011, and was found to have severe obstructive sleep apnea with an apnea-hypopnea index of 70.  He returned to the Sleep Lab for a titration study.  Height is 5 feet and 10 inches, weight is 216 pounds.  BMI is 31, neck size is 17 inches.  EPWORTH SLEEPINESS SCORE:  4.  MEDICATIONS:  Aspirin, ramipril, nitroglycerin, pantoprazole, Atrovent, Crestor, Lovaza, pramipexole.  SLEEP ARCHITECTURE:  Total recording time was 336 minutes.  Total sleep time was 335 minutes.  Sleep efficiency was 91%.  Sleep latency was 2.5 minutes.  REM latency was 70 minutes.  The study was notable for the lack of stage III sleep and he slept in both the supine and nonsupine positions.  RESPIRATORY DATA:  The average respiratory rate was 17.  The patient was started on CPAP of 5 and increased to 17 cm of water.  He was then transitioned to BiPAP starting at 19/15 increased to 21/17.  With BiPAP set at 19/15 cm of water, he had improvement in his sleep-disordered breathing.  At this pressure setting, he was observed in REM sleep and supine sleep.  He still had episodes of central apneas, which were more prominent with higher pressure settings.  OXYGEN DATA:  The baseline oxygenation was 97%.  The oxygen saturation nadir was 85%.  The study was conducted without the use of supplemental oxygen.  CARDIAC DATA:  The average  heart rate was 53 and the rhythm strip showed sinus rhythm with occasional PVCs.  MOVEMENT-PARASOMNIA:  The periodic limb movement index was 1.1.  IMPRESSIONS-RECOMMENDATIONS:  The patient had reasonable control of his obstructive sleep apnea with BiPAP of 19/15 cm of water.  However, he did have episodes of central sleep apnea associated with some oxygen desaturation, which appeared to be more prominent with higher pressure settings.  In addition to diet, excise, and weight reduction, I would recommend that the patient be tried on BiPAP at 19/15 cm of water.  He should then undergo an overnight oximetry.  Depending upon the results of the overnight oximetry, he may benefit from the addition of supplemental oxygen in conjunction with the use of BiPAP therapy.     Coralyn Helling, MD Diplomat, American Board of Sleep Medicine    VS/MEDQ  D:  08/11/2011 09:12:01  T:  08/11/2011 21:30:86  Job:  [id number]

## 2011-08-13 ENCOUNTER — Telehealth: Payer: Self-pay | Admitting: Pulmonary Disease

## 2011-08-13 NOTE — Telephone Encounter (Signed)
Pt is aware of this. I also made alida aware to fax this over today to [facility].

## 2011-08-13 NOTE — Telephone Encounter (Signed)
LMOMTCB x 1 

## 2011-08-13 NOTE — Telephone Encounter (Signed)
Duplicate msg. See note from 08/08/11.

## 2011-08-13 NOTE — Telephone Encounter (Signed)
I have placed order for auto CPAP range 10 to 20 cm H2O.  Please inform patient of this.

## 2011-08-13 NOTE — Telephone Encounter (Signed)
[name]) [phone] want to talk to a nurse about changing his setting on cpap machine to help him out with insurance. pt is there now.

## 2011-08-13 NOTE — Telephone Encounter (Signed)
Pt does not want to have BiPap dus to monthly rental cost of $141. Per jimmy at [facility], they can put pt on CPAP (100% coverage) at 19 with C-flex of 3 and this would be close to BiPap pressure of 19/15. Pls advise.

## 2011-08-14 DIAGNOSIS — G4737 Central sleep apnea in conditions classified elsewhere: Secondary | ICD-10-CM

## 2011-08-14 DIAGNOSIS — Z79899 Other long term (current) drug therapy: Secondary | ICD-10-CM

## 2011-08-14 DIAGNOSIS — G4733 Obstructive sleep apnea (adult) (pediatric): Secondary | ICD-10-CM

## 2012-02-18 ENCOUNTER — Other Ambulatory Visit: Payer: Self-pay | Admitting: Cardiology

## 2012-02-20 MED ORDER — ROSUVASTATIN CALCIUM 5 MG PO TABS: ORAL_TABLET | ORAL | Status: DC

## 2012-02-20 MED ORDER — RAMIPRIL 2.5 MG PO CAPS: 2.5000 mg | ORAL_CAPSULE | Freq: Every day | ORAL | Status: DC

## 2012-02-20 MED ORDER — PANTOPRAZOLE SODIUM 40 MG PO TBEC: 40.0000 mg | DELAYED_RELEASE_TABLET | Freq: Every day | ORAL | Status: DC

## 2012-02-20 MED ORDER — PRASUGREL HCL 10 MG PO TABS: 10.0000 mg | ORAL_TABLET | Freq: Every day | ORAL | Status: DC

## 2012-02-20 NOTE — Telephone Encounter (Signed)
Not sure if Dr, Antoine Poche will fill pramipexole will route to his nurse

## 2012-02-24 ENCOUNTER — Ambulatory Visit: Payer: 59 | Admitting: Cardiology

## 2012-02-26 ENCOUNTER — Other Ambulatory Visit: Payer: Self-pay | Admitting: Cardiology

## 2012-03-05 ENCOUNTER — Other Ambulatory Visit: Payer: Self-pay | Admitting: Cardiology

## 2012-03-05 MED ORDER — PANTOPRAZOLE SODIUM 40 MG PO TBEC: 40.0000 mg | DELAYED_RELEASE_TABLET | Freq: Every day | ORAL | Status: DC

## 2012-03-05 MED ORDER — PRAMIPEXOLE DIHYDROCHLORIDE 1 MG PO TABS: 1.0000 mg | ORAL_TABLET | Freq: Every day | ORAL | Status: DC

## 2012-03-05 MED ORDER — RAMIPRIL 2.5 MG PO CAPS: 2.5000 mg | ORAL_CAPSULE | Freq: Every day | ORAL | Status: DC

## 2012-04-05 ENCOUNTER — Encounter: Payer: Self-pay | Admitting: Cardiology

## 2012-04-05 ENCOUNTER — Ambulatory Visit (INDEPENDENT_AMBULATORY_CARE_PROVIDER_SITE_OTHER): Payer: 59 | Admitting: Cardiology

## 2012-04-05 VITALS — BP 144/70 | HR 72 | Ht 70.0 in | Wt 225.4 lb

## 2012-04-05 DIAGNOSIS — E7800 Pure hypercholesterolemia, unspecified: Secondary | ICD-10-CM

## 2012-04-05 DIAGNOSIS — I2510 Atherosclerotic heart disease of native coronary artery without angina pectoris: Secondary | ICD-10-CM

## 2012-04-05 MED ORDER — PRASUGREL HCL 10 MG PO TABS: 10.0000 mg | ORAL_TABLET | Freq: Every day | ORAL | Status: DC

## 2012-04-05 MED ORDER — ROSUVASTATIN CALCIUM 5 MG PO TABS: ORAL_TABLET | ORAL | Status: DC

## 2012-04-05 MED ORDER — PANTOPRAZOLE SODIUM 40 MG PO TBEC: 40.0000 mg | DELAYED_RELEASE_TABLET | Freq: Every day | ORAL | Status: DC

## 2012-04-05 MED ORDER — PRAMIPEXOLE DIHYDROCHLORIDE 1 MG PO TABS: 1.0000 mg | ORAL_TABLET | Freq: Every day | ORAL | Status: DC

## 2012-04-05 MED ORDER — RAMIPRIL 2.5 MG PO CAPS: 2.5000 mg | ORAL_CAPSULE | Freq: Every day | ORAL | Status: DC

## 2012-04-05 NOTE — Progress Notes (Signed)
HPI The patient presents for followup of his known coronary disease. Since I last saw him he has done well.  The patient denies any new symptoms such as chest discomfort, neck or arm discomfort. There has been no new shortness of breath, PND or orthopnea. There have been no reported palpitations, presyncope or syncope.  He has gained 9 lbs on vacation.  Since I last saw him he did have a sleep study and was diagnosed with severe sleep apnea. He's been wearing CPAP but doesn't really think this made him feel any different. He only sleeps 4 hours a day but he actually says he feels rested.  Allergies  Allergen Reactions  . Lipitor (Atorvastatin Calcium)   . Niaspan (Niacin Er)     Current Outpatient Prescriptions  Medication Sig Dispense Refill  . aspirin 81 MG tablet Take 81 mg by mouth daily.        Marland Kitchen ipratropium (ATROVENT HFA) 17 MCG/ACT inhaler Inhale 2 puffs into the lungs 4 (four) times daily.        . nitroGLYCERIN (NITROSTAT) 0.4 MG SL tablet Place 0.4 mg under the tongue as needed.        Marland Kitchen omega-3 acid ethyl esters (LOVAZA) 1 G capsule Take 2 g by mouth 2 (two) times daily.        . pantoprazole (PROTONIX) 40 MG tablet Take 1 tablet (40 mg total) by mouth daily.  90 tablet  3  . pramipexole (MIRAPEX) 1 MG tablet Take 1 tablet (1 mg total) by mouth at bedtime.  30 tablet  0  . prasugrel (EFFIENT) 10 MG TABS Take 1 tablet (10 mg total) by mouth daily.  90 tablet  3  . ramipril (ALTACE) 2.5 MG capsule Take 1 capsule (2.5 mg total) by mouth daily.  90 capsule  3  . rosuvastatin (CRESTOR) 5 MG tablet 1 TAB AT NIGHT ON TUES THURS AND SAT  90 tablet  3    Past Medical History  Diagnosis Date  . ASCVD (arteriosclerotic cardiovascular disease)   . MI (myocardial infarction)     x2  . Hyperlipidemia   . OSA (obstructive sleep apnea) 05/24/11  . Restless leg syndrome 06/25/2011    Past Surgical History  Procedure Date  . Tonsillectomy and adenoidectomy   . Cystectomy     Spine and  jaw  . Lesion removed from foot     right foot  . Coronary stent placement 04/2011    ROS:  As stated in the HPI and negative for all other systems.  PHYSICAL EXAM BP 144/70  Pulse 72  Ht 5\' 10"  (1.778 m)  Wt 225 lb 6.4 oz (102.241 kg)  BMI 32.34 kg/m2 GENERAL:  Well appearing HEENT:  Pupils equal round and reactive, fundi not visualized, oral mucosa unremarkable NECK:  No jugular venous distention, waveform within normal limits, carotid upstroke brisk and symmetric, no bruits, no thyromegaly LYMPHATICS:  No cervical, inguinal adenopathy LUNGS:  Clear to auscultation bilaterally BACK:  No CVA tenderness CHEST:  Unremarkable HEART:  PMI not displaced or sustained,S1 and S2 within normal limits, no S3, no S4, no clicks, no rubs, no murmurs ABD:  Flat, positive bowel sounds normal in frequency in pitch, no bruits, no rebound, no guarding, no midline pulsatile mass, no hepatomegaly, no splenomegaly EXT:  2 plus pulses throughout, no edema, no cyanosis no clubbing   EKG:  Normal sinus rhythm, rate 72, old inferior infarct, inferior T-wave inversion, early transition in lead V2, no change  from previous.  04/05/2012   ASSESSMENT AND PLAN  CAD (coronary artery disease) - The patient has no new sypmtoms.  No further cardiovascular testing is indicated.  We will continue with aggressive risk reduction and meds as listed.  Dyslipidemia -  He will come back for a fasting lipid profile.  The goal will be an LDL less than 100 and HDL greater than 40.  Sleep apnea -  He will continue with CPAP.  We also discussed weight loss  Hypertension -  His blood pressure is mildly elevated. We discussed weight loss and he will otherwise continue the meds as listed.

## 2012-04-05 NOTE — Patient Instructions (Addendum)
The current medical regimen is effective;  continue present plan and medications.  Follow up in 1 year with Dr Hochrein.  You will receive a letter in the mail 2 months before you are due.  Please call us when you receive this letter to schedule your follow up appointment.  

## 2012-11-23 ENCOUNTER — Emergency Department (HOSPITAL_COMMUNITY): Payer: 59

## 2012-11-23 ENCOUNTER — Encounter (HOSPITAL_COMMUNITY): Payer: Self-pay | Admitting: Emergency Medicine

## 2012-11-23 ENCOUNTER — Observation Stay (HOSPITAL_COMMUNITY)
Admission: EM | Admit: 2012-11-23 | Discharge: 2012-11-25 | Disposition: A | Discharge status: Home / Self Care | Payer: 59 | Attending: Cardiology | Admitting: Cardiology

## 2012-11-23 DIAGNOSIS — IMO0001 Reserved for inherently not codable concepts without codable children: Secondary | ICD-10-CM

## 2012-11-23 DIAGNOSIS — E119 Type 2 diabetes mellitus without complications: Secondary | ICD-10-CM | POA: Insufficient documentation

## 2012-11-23 DIAGNOSIS — I200 Unstable angina: Secondary | ICD-10-CM

## 2012-11-23 DIAGNOSIS — Z9861 Coronary angioplasty status: Secondary | ICD-10-CM | POA: Insufficient documentation

## 2012-11-23 DIAGNOSIS — I498 Other specified cardiac arrhythmias: Secondary | ICD-10-CM | POA: Insufficient documentation

## 2012-11-23 DIAGNOSIS — E785 Hyperlipidemia, unspecified: Secondary | ICD-10-CM

## 2012-11-23 DIAGNOSIS — G4733 Obstructive sleep apnea (adult) (pediatric): Secondary | ICD-10-CM | POA: Diagnosis present

## 2012-11-23 DIAGNOSIS — I10 Essential (primary) hypertension: Secondary | ICD-10-CM

## 2012-11-23 DIAGNOSIS — J42 Unspecified chronic bronchitis: Secondary | ICD-10-CM

## 2012-11-23 DIAGNOSIS — J449 Chronic obstructive pulmonary disease, unspecified: Secondary | ICD-10-CM | POA: Insufficient documentation

## 2012-11-23 DIAGNOSIS — R0602 Shortness of breath: Secondary | ICD-10-CM | POA: Insufficient documentation

## 2012-11-23 DIAGNOSIS — I2541 Coronary artery aneurysm: Secondary | ICD-10-CM | POA: Insufficient documentation

## 2012-11-23 DIAGNOSIS — I2510 Atherosclerotic heart disease of native coronary artery without angina pectoris: Secondary | ICD-10-CM

## 2012-11-23 DIAGNOSIS — J4489 Other specified chronic obstructive pulmonary disease: Secondary | ICD-10-CM | POA: Insufficient documentation

## 2012-11-23 DIAGNOSIS — I519 Heart disease, unspecified: Secondary | ICD-10-CM | POA: Insufficient documentation

## 2012-11-23 DIAGNOSIS — R079 Chest pain, unspecified: Principal | ICD-10-CM | POA: Insufficient documentation

## 2012-11-23 DIAGNOSIS — R001 Bradycardia, unspecified: Secondary | ICD-10-CM

## 2012-11-23 HISTORY — DX: Essential (primary) hypertension: I10

## 2012-11-23 HISTORY — DX: Type 2 diabetes mellitus without complications: E11.9

## 2012-11-23 LAB — CBC WITH DIFFERENTIAL/PLATELET
Eosinophils Absolute: 0.3 10*3/uL (ref 0.0–0.7)
Eosinophils Relative: 3 % (ref 0–5)
Hemoglobin: 16.2 g/dL (ref 13.0–17.0)
Lymphs Abs: 4.8 10*3/uL — ABNORMAL HIGH (ref 0.7–4.0)
MCH: 32.1 pg (ref 26.0–34.0)
MCV: 88.5 fL (ref 78.0–100.0)
Monocytes Relative: 7 % (ref 3–12)
RBC: 5.04 MIL/uL (ref 4.22–5.81)

## 2012-11-23 LAB — COMPREHENSIVE METABOLIC PANEL
Alkaline Phosphatase: 79 U/L (ref 39–117)
BUN: 14 mg/dL (ref 6–23)
Calcium: 9.9 mg/dL (ref 8.4–10.5)
Creatinine, Ser: 0.88 mg/dL (ref 0.50–1.35)
GFR calc Af Amer: >90 mL/min (ref >90)
Glucose, Bld: 104 mg/dL — ABNORMAL HIGH (ref 70–99)
Total Protein: 7.8 g/dL (ref 6.0–8.3)

## 2012-11-23 LAB — TROPONIN I: Troponin I: <0.3 ng/mL (ref <0.30)

## 2012-11-23 LAB — POCT I-STAT TROPONIN I: Troponin i, poc: 0.01 ng/mL (ref 0.00–0.08)

## 2012-11-23 LAB — PRO B NATRIURETIC PEPTIDE: Pro B Natriuretic peptide (BNP): 43.1 pg/mL (ref 0–125)

## 2012-11-23 MED ORDER — HEPARIN BOLUS VIA INFUSION
4000.0000 [IU] | Freq: Once | INTRAVENOUS | Status: AC
  Administered 2012-11-23: 4000 [IU] via INTRAVENOUS

## 2012-11-23 MED ORDER — ROSUVASTATIN CALCIUM 5 MG PO TABS
5.0000 mg | ORAL_TABLET | ORAL | Status: DC
  Administered 2012-11-24: 5 mg via ORAL
  Filled 2012-11-23: qty 1

## 2012-11-23 MED ORDER — ONDANSETRON HCL 4 MG/2ML IJ SOLN: 4.0000 mg | Freq: Four times a day (QID) | INTRAMUSCULAR | Status: DC | PRN

## 2012-11-23 MED ORDER — HEPARIN (PORCINE) IN NACL 100-0.45 UNIT/ML-% IJ SOLN
1300.0000 [IU]/h | INTRAMUSCULAR | Status: DC
  Administered 2012-11-23: 1300 [IU]/h via INTRAVENOUS
  Filled 2012-11-23 (×2): qty 250

## 2012-11-23 MED ORDER — ASPIRIN 81 MG PO CHEW: 81.0000 mg | CHEWABLE_TABLET | Freq: Every day | ORAL | Status: DC

## 2012-11-23 MED ORDER — ACETAMINOPHEN 325 MG PO TABS
650.0000 mg | ORAL_TABLET | ORAL | Status: DC | PRN
  Administered 2012-11-24 – 2012-11-25 (×2): 650 mg via ORAL
  Filled 2012-11-23 (×2): qty 2

## 2012-11-23 MED ORDER — ACETAMINOPHEN 325 MG PO TABS
650.0000 mg | ORAL_TABLET | Freq: Once | ORAL | Status: AC
  Administered 2012-11-23: 650 mg via ORAL
  Filled 2012-11-23: qty 2

## 2012-11-23 MED ORDER — NON FORMULARY: 5.0000 mg | Status: DC

## 2012-11-23 MED ORDER — IPRATROPIUM BROMIDE HFA 17 MCG/ACT IN AERS: 2.0000 | INHALATION_SPRAY | Freq: Every day | RESPIRATORY_TRACT | Status: DC | PRN

## 2012-11-23 MED ORDER — SODIUM CHLORIDE 0.9 % IV SOLN
INTRAVENOUS | Status: DC
  Administered 2012-11-24: 04:00:00 via INTRAVENOUS

## 2012-11-23 MED ORDER — ASPIRIN 81 MG PO TABS: 81.0000 mg | ORAL_TABLET | Freq: Every evening | ORAL | Status: DC

## 2012-11-23 MED ORDER — ASPIRIN 81 MG PO CHEW
324.0000 mg | CHEWABLE_TABLET | ORAL | Status: AC
  Administered 2012-11-24: 324 mg via ORAL
  Filled 2012-11-23: qty 4

## 2012-11-23 MED ORDER — ASPIRIN 81 MG PO CHEW
324.0000 mg | CHEWABLE_TABLET | Freq: Once | ORAL | Status: AC
  Administered 2012-11-23: 324 mg via ORAL
  Filled 2012-11-23: qty 4

## 2012-11-23 MED ORDER — MORPHINE SULFATE 2 MG/ML IJ SOLN: 1.0000 mg | INTRAMUSCULAR | Status: DC | PRN

## 2012-11-23 MED ORDER — PANTOPRAZOLE SODIUM 40 MG PO TBEC
40.0000 mg | DELAYED_RELEASE_TABLET | Freq: Every evening | ORAL | Status: DC
Start: 2012-11-24 — End: 2012-11-25
  Administered 2012-11-24: 40 mg via ORAL
  Filled 2012-11-23: qty 1

## 2012-11-23 MED ORDER — NITROGLYCERIN 2 % TD OINT
1.0000 [in_us] | TOPICAL_OINTMENT | Freq: Once | TRANSDERMAL | Status: AC
  Administered 2012-11-23: 1 [in_us] via TOPICAL
  Filled 2012-11-23: qty 1

## 2012-11-23 MED ORDER — PRASUGREL HCL 10 MG PO TABS
10.0000 mg | ORAL_TABLET | Freq: Every morning | ORAL | Status: DC
  Administered 2012-11-24 – 2012-11-25 (×2): 10 mg via ORAL
  Filled 2012-11-23 (×3): qty 1

## 2012-11-23 MED ORDER — NITROGLYCERIN 0.4 MG SL SUBL
0.4000 mg | SUBLINGUAL_TABLET | SUBLINGUAL | Status: DC | PRN
  Administered 2012-11-23 (×2): 0.4 mg via SUBLINGUAL
  Filled 2012-11-23: qty 25

## 2012-11-23 MED ORDER — PRAMIPEXOLE DIHYDROCHLORIDE 0.25 MG PO TABS
0.5000 mg | ORAL_TABLET | Freq: Two times a day (BID) | ORAL | Status: DC
  Filled 2012-11-23 (×4): qty 2

## 2012-11-23 MED ORDER — METOPROLOL TARTRATE 25 MG PO TABS
25.0000 mg | ORAL_TABLET | Freq: Two times a day (BID) | ORAL | Status: DC
  Administered 2012-11-23 – 2012-11-25 (×3): 25 mg via ORAL
  Filled 2012-11-23 (×5): qty 1

## 2012-11-23 MED ORDER — RAMIPRIL 2.5 MG PO CAPS
2.5000 mg | ORAL_CAPSULE | Freq: Every evening | ORAL | Status: DC
  Administered 2012-11-24: 2.5 mg via ORAL
  Filled 2012-11-23 (×2): qty 1

## 2012-11-23 NOTE — ED Notes (Signed)
Per Cardiologist, remove nitro paste.

## 2012-11-23 NOTE — ED Provider Notes (Signed)
History     CSN: [id number]  Arrival date & time 11/23/12  1758   First MD Initiated Contact with Patient 11/23/12 1820      Chief Complaint  Patient presents with  . Shortness of Breath    (Consider location/radiation/quality/duration/timing/severity/associated sxs/prior treatment) HPI Pt with history of CAD s/p stent reports about a week of progressively worsening dyspnea with exertion, orthopnea and chest 'tightness', but denies chest pain. No nausea, diaphoresis or arm pain. No cough or fever. No leg swelling. He denies any recent stress test or heart cath.   Past Medical History  Diagnosis Date  . ASCVD (arteriosclerotic cardiovascular disease)     2012.  LAD 40 - 50%, D1 50%, OM1 95% treated with BMS, RCA chronic total occlusion  . MI (myocardial infarction)     x2  . Hyperlipidemia   . OSA (obstructive sleep apnea) 05/24/11  . Restless leg syndrome 06/25/2011    Past Surgical History  Procedure Laterality Date  . Tonsillectomy and adenoidectomy    . Cystectomy      Spine and jaw  . Lesion removed from foot      right foot  . Coronary stent placement  04/2011    Family History  Problem Relation Age of Onset  . Lymphoma Mother   . Heart attack Father 11  . Heart attack Brother 51    History  Substance Use Topics  . Smoking status: Former Smoker -- 1.50 packs/day for 35 years    Types: Cigarettes    Quit date: 08/18/2005  . Smokeless tobacco: Not on file  . Alcohol Use: No      Review of Systems All other systems reviewed and are negative except as noted in HPI.   Allergies  Lipitor and Niaspan  Home Medications   Current Outpatient Rx  Name  Route  Sig  Dispense  Refill  . aspirin 81 MG tablet   Oral   Take 81 mg by mouth every evening.          Marland Kitchen ipratropium (ATROVENT HFA) 17 MCG/ACT inhaler   Inhalation   Inhale 2 puffs into the lungs daily as needed for wheezing (uses in the evening if needed).          . nitroGLYCERIN (NITROSTAT)  0.4 MG SL tablet   Sublingual   Place 0.4 mg under the tongue every 5 (five) minutes as needed for chest pain.          . pantoprazole (PROTONIX) 40 MG tablet   Oral   Take 40 mg by mouth every evening.         . pramipexole (MIRAPEX) 1 MG tablet   Oral   Take 0.5 mg by mouth 2 (two) times daily.         . prasugrel (EFFIENT) 10 MG TABS   Oral   Take 10 mg by mouth every morning.         . ramipril (ALTACE) 2.5 MG capsule   Oral   Take 2.5 mg by mouth every evening.         . rosuvastatin (CRESTOR) 5 MG tablet   Oral   Take 5 mg by mouth 3 (three) times a week. Takes at night on tue, thu, sat           BP 124/71  Pulse 84  Temp(Src) 98 F (36.7 C) (Oral)  Resp 16  SpO2 100%  Physical Exam  Nursing note and vitals reviewed. Constitutional: He is oriented to  person, place, and time. He appears well-developed and well-nourished.  HENT:  Head: Normocephalic and atraumatic.  Eyes: EOM are normal. Pupils are equal, round, and reactive to light.  Neck: Normal range of motion. Neck supple.  Cardiovascular: Normal rate, normal heart sounds and intact distal pulses.   Pulmonary/Chest: Effort normal and breath sounds normal.  Abdominal: Bowel sounds are normal. He exhibits no distension. There is no tenderness.  Musculoskeletal: Normal range of motion. He exhibits no edema and no tenderness.  Neurological: He is alert and oriented to person, place, and time. He has normal strength. No cranial nerve deficit or sensory deficit.  Skin: Skin is warm and dry. No rash noted.  Psychiatric: He has a normal mood and affect.    ED Course  Procedures (including critical care time)  Labs Reviewed  CBC WITH DIFFERENTIAL - Abnormal; Notable for the following:    WBC 10.9 (*)    MCHC 36.3 (*)    Lymphs Abs 4.8 (*)    All other components within normal limits  COMPREHENSIVE METABOLIC PANEL - Abnormal; Notable for the following:    Glucose, Bld 104 (*)    All other  components within normal limits  PRO B NATRIURETIC PEPTIDE  POCT I-STAT TROPONIN I   Dg Chest 2 View  11/23/2012  *RADIOLOGY REPORT*  Clinical Data: Shortness of breath, coronary disease  CHEST - 2 VIEW  Comparison: 05/13/2011  Findings: Cardiomegaly evident with diffuse interstitial prominence, new since the prior study which could represent early developing edema.  No large effusion or pneumothorax.  No focal pneumonia, collapse or consolidation.  IMPRESSION: Cardiomegaly with increased interstitial prominence versus mild early edema   Original Report Authenticated By: [name], M.D.      1. Unstable angina       MDM   Date: 11/23/2012  Rate: 71  Rhythm: normal sinus rhythm  QRS Axis: normal  Intervals: normal  ST/T Wave abnormalities: nonspecific T wave changes  Conduction Disutrbances:none  Narrative Interpretation:   Old EKG Reviewed: unchanged  Pt reports improved chest tightness with NTG. Nitropaste ordered. EKG and Trop neg. Suspect this is new angina. Heparin drip started. Discussed with Dr. Daleen Squibb, covering for [place] who will come evaluate the patient.   CRITICAL CARE Performed by: Pollyann Savoy   Total critical care time: 30  Critical care time was exclusive of separately billable procedures and treating other patients.   Critical care was necessary to treat or prevent imminent or life-threatening deterioration.  Critical care was time spent personally by me on the following activities: development of treatment plan with patient and/or surrogate as well as nursing, discussions with consultants, evaluation of patient's response to treatment, examination of patient, obtaining history from patient or surrogate, ordering and performing treatments and interventions, ordering and review of laboratory studies, ordering and review of radiographic studies, pulse oximetry and re-evaluation of patient's condition.       Lucian Baswell B. Bernette Mayers, MD 11/23/12 7829

## 2012-11-23 NOTE — Consult Note (Signed)
ANTICOAGULATION CONSULT NOTE - Initial Consult  Pharmacy Consult for Heparin Indication: Chest pain  Allergies: Allergies  Allergen Reactions  . Lipitor (Atorvastatin Calcium)   . Niaspan (Niacin Er)     Height/Weight: Weight: 219 lb 9 oz (99.593 kg) Dosing weight 94 kg  Vital Signs: BP 132/85  Pulse 65  Temp(Src) 98 F (36.7 C) (Oral)  Resp 18  Wt 219 lb 9 oz (99.593 kg)  BMI 31.5 kg/m2  SpO2 94%  Labs:  Recent Labs  11/23/12 1808  HGB 16.2  HCT 44.6  PLT 173  CREATININE 0.88   Medical / Surgical History: Past Medical History  Diagnosis Date  . ASCVD (arteriosclerotic cardiovascular disease)     a. MI 2004, did not seek care at that time. b. s/p PTCA/BMS to OM1 04/2011 (known totally occluded RCA with L-R collaterals and failed angioplasty on that vessel).  . MI (myocardial infarction)     x2  . Hyperlipidemia   . OSA (obstructive sleep apnea) 05/24/11  . Restless leg syndrome 06/25/2011  . LV dysfunction     EF 45% by cath 2012  . HTN (hypertension)   . COPD (chronic obstructive pulmonary disease)     /asthma  . Hyperglycemia    Past Surgical History  Procedure Laterality Date  . Tonsillectomy and adenoidectomy    . Cystectomy      Spine and jaw  . Lesion removed from foot      right foot  . Coronary stent placement  04/2011    Medications:  No current facility-administered medications on file prior to encounter.   Current Outpatient Prescriptions on File Prior to Encounter  Medication Sig Dispense Refill  . aspirin 81 MG tablet Take 81 mg by mouth every evening.       Marland Kitchen ipratropium (ATROVENT HFA) 17 MCG/ACT inhaler Inhale 2 puffs into the lungs daily as needed for wheezing (uses in the evening if needed).       . nitroGLYCERIN (NITROSTAT) 0.4 MG SL tablet Place 0.4 mg under the tongue every 5 (five) minutes as needed for chest pain.         Scheduled:  . [COMPLETED] aspirin  324 mg Oral Once  . [COMPLETED] nitroGLYCERIN  1 inch Topical Once     Assessment:  57 y.o.male with history of CAD and LV dysfunction admitted for work up of chest pain.  Pharmacy consulted to dose Heparin for chest pain  Baseline CBC wnl.  Scheduled cardiac cath in AM  Goal of Therapy:   Heparin level 0.3-0.7 units/ml   Plan:   Heparin 4000 unit load, then  Heparin infusion at 1300 units/hr.  Follow up 6 hour heparin level, CBC, then daily while on Heparin.  Follow up after cath.  Asra Gambrel, Elisha Headland,  Pharm.D.. 11/23/2012,  8:23 PM

## 2012-11-23 NOTE — ED Notes (Addendum)
Cardiologist at bedside.  

## 2012-11-23 NOTE — H&P (Signed)
History and Physical  Patient ID: [name] MRN: [id number], DOB: 06/08/1956 Date of Encounter: 11/23/2012, 7:43 PM Primary Physician: Lucila Maine, MD Primary Cardiologist: Hochrein  Chief Complaint: chest pain  HPI: [name] is a 57 y/o M with history of CAD s/p BMS to OM1 2012 (known occluded RCA failed angioplasty in the past), LV dysfunction EF 45% in 2012, HTN, COPD, OSA who presented to [facility] with complaints of exertional chest pain. For the past week he's had dyspnea on exertion. However, today he began having chest pressure with exertion. This is consistent with his prior angina. He has been compliant with meds. In the ER, EKG nonacute. CXR shows cardiomegaly with increased interstitial prominence versus mild early edema. Labs significant for WBC 10.9, glu 104, troponin neg x 1.   Past Medical History  Diagnosis Date  . ASCVD (arteriosclerotic cardiovascular disease)     a. MI 2004, did not seek care at that time. b. s/p PTCA/BMS to OM1 04/2011 (known totally occluded RCA with L-R collaterals and failed angioplasty on that vessel).  . MI (myocardial infarction)     x2  . Hyperlipidemia   . OSA (obstructive sleep apnea) 05/24/11  . Restless leg syndrome 06/25/2011  . LV dysfunction     EF 45% by cath 2012  . HTN (hypertension)   . COPD (chronic obstructive pulmonary disease)     /asthma  . Hyperglycemia      Most Recent Cardiac Studies: See cath report 04/2011   Surgical History:  Past Surgical History  Procedure Laterality Date  . Tonsillectomy and adenoidectomy    . Cystectomy      Spine and jaw  . Lesion removed from foot      right foot  . Coronary stent placement  04/2011     Home Meds: Prior to Admission medications   Medication Sig Start Date End Date Taking? Authorizing Provider  aspirin 81 MG tablet Take 81 mg by mouth every evening.    Yes Historical Provider, MD  ipratropium (ATROVENT HFA) 17 MCG/ACT inhaler Inhale 2 puffs into the  lungs daily as needed for wheezing (uses in the evening if needed).    Yes Historical Provider, MD  nitroGLYCERIN (NITROSTAT) 0.4 MG SL tablet Place 0.4 mg under the tongue every 5 (five) minutes as needed for chest pain.    Yes Historical Provider, MD  pantoprazole (PROTONIX) 40 MG tablet Take 40 mg by mouth every evening.   Yes Historical Provider, MD  pramipexole (MIRAPEX) 1 MG tablet Take 0.5 mg by mouth 2 (two) times daily.   Yes Historical Provider, MD  prasugrel (EFFIENT) 10 MG TABS Take 10 mg by mouth every morning.   Yes Historical Provider, MD  ramipril (ALTACE) 2.5 MG capsule Take 2.5 mg by mouth every evening.   Yes Historical Provider, MD  rosuvastatin (CRESTOR) 5 MG tablet Take 5 mg by mouth 3 (three) times a week. Takes at night on tue, thu, sat   Yes Historical Provider, MD    Allergies:  Allergies  Allergen Reactions  . Lipitor (Atorvastatin Calcium)   . Niaspan (Niacin Er)     History   Social History  . Marital Status: Married    Spouse Name: N/A    Number of Children: 1  . Years of Education: N/A   Occupational History  . [employer]     Social History Main Topics  . Smoking status: Former Smoker -- 1.50 packs/day for 35 years  Types: Cigarettes    Quit date: 08/18/2005  . Smokeless tobacco: Not on file  . Alcohol Use: No  . Drug Use: No  . Sexually Active: Not on file   Other Topics Concern  . Not on file   Social History Narrative   Married with 1 grown daughter who is a Diplomatic Services operational officer for a school           Family History  Problem Relation Age of Onset  . Lymphoma Mother   . Heart attack Father 63  . Heart attack Brother 51    Review of Systems: General: negative for chills, fever, night sweats. General weight gain but no abrupt change recently.  Cardiovascular: see above. No palpitations Dermatological: negative for rash Respiratory: negative for cough or wheezing Urologic: negative for hematuria Abdominal: negative for nausea,  vomiting, diarrhea, bright red blood per rectum, melena, or hematemesis Neurologic: negative for visual changes, syncope, or dizziness All other systems reviewed and are otherwise negative except as noted above.  Labs:   Lab Results  Component Value Date   WBC 10.9* 11/23/2012   HGB 16.2 11/23/2012   HCT 44.6 11/23/2012   MCV 88.5 11/23/2012   PLT 173 11/23/2012    Recent Labs Lab 11/23/12 1808  NA 138  K 3.9  CL 103  CO2 23  BUN 14  CREATININE 0.88  CALCIUM 9.9  PROT 7.8  BILITOT 0.3  ALKPHOS 79  ALT 49  AST 37  GLUCOSE 104*   Troponin neg x 1 Lab Results  Component Value Date   CHOL 130 05/14/2011   HDL 30* 05/14/2011   LDLCALC 77 05/14/2011   TRIG 116 05/14/2011    Radiology/Studies:  Dg Chest 2 View 11/23/2012  *RADIOLOGY REPORT*  Clinical Data: Shortness of breath, coronary disease  CHEST - 2 VIEW  Comparison: 05/13/2011  Findings: Cardiomegaly evident with diffuse interstitial prominence, new since the prior study which could represent early developing edema.  No large effusion or pneumothorax.  No focal pneumonia, collapse or consolidation.  IMPRESSION: Cardiomegaly with increased interstitial prominence versus mild early edema   Original Report Authenticated By: Judie Petit. Shick, M.D.     EKG: NSR 71 nonspecific ST-T changes unchanged from prior   Physical Exam: Blood pressure 112/68, pulse 80, temperature 98 F (36.7 C), temperature source Oral, resp. rate 21, SpO2 92.00%. General: Well developed, well nourished, in no acute distress, ruddy complexion. Head: Normocephalic, atraumatic, sclera non-icteric, no xanthomas, nares are without discharge.  Neck: Negative for carotid bruits. JVD not elevated. Lungs: Clear bilaterally to auscultation without wheezes, rales, or rhonchi. Breathing is unlabored. Heart: RRR with S1 S2. No murmurs, rubs, or gallops appreciated. Abdomen: Soft, non-tender, mild distention but soft with normoactive bowel sounds. No hepatomegaly. No  rebound/guarding. No obvious abdominal masses. Msk:  Strength and tone appear normal for age. Extremities: No clubbing or cyanosis. No edema.  Distal pedal pulses are 2+ and equal bilaterally. Neuro: Alert and oriented X 3. No focal deficit. No facial asymmetry. Moves all extremities spontaneously. Psych:  Responds to questions appropriately with a normal affect.    ASSESSMENT AND PLAN:   1. Chest pain worrisome for [place] with history of CAD 2. LV dysfunction EF 45% 3. HTN 4. OSA  Symptoms worrisome for [place]. Plan admit, cycle enzymes, IV heparin. Will remove NTG paste given h/o headache with nitro. Plan cath in AM. Continue home meds except add low dose beta blocker. Follow BPs. Pt agreeable to plan.  Signed, Ronie Spies PA-C 11/23/2012,  7:43 PM     I have taken a history, reviewed medications, allergies, PMH, SH, FH, and reviewed ROS and examined the patient.  I agree with the assessment and plan. I have recommended cath. Indications, benefit and potential outcomes discussed. Agrees to proceed.  Kiora Hallberg C. Daleen Squibb, MD, [facility] Pager:  [phone]

## 2012-11-23 NOTE — ED Notes (Signed)
Pt was using the urinal and started having 8/10 CP. 1 Nitro given. Pt reports pain is decreasing.

## 2012-11-23 NOTE — ED Provider Notes (Signed)
Date: 11/23/2012  Rate: 71  Rhythm: normal sinus rhythm  QRS Axis: normal  Intervals: normal  ST/T Wave abnormalities: normal  Conduction Disutrbances: none  Narrative Interpretation:   Old EKG Reviewed: No significant changes noted     Lyanne Co, MD 11/23/12 2030

## 2012-11-23 NOTE — ED Notes (Signed)
Onset 3 days ago shortness of breath worsening overtime. History of MI and cardiac stents. Denies chest pain. States having a dry cough recently.

## 2012-11-23 NOTE — ED Notes (Signed)
Pt complaining of headache from nitroglycerin. RN paged on call MD, per MD give tylenol.

## 2012-11-23 NOTE — Progress Notes (Signed)
Patient refused CPAP for tonight. York Spaniel he would be okay without it. Explained if he changed his mind he could just call and we could get him a CPAP anytime his length of stay. RN aware.

## 2012-11-24 ENCOUNTER — Encounter (HOSPITAL_COMMUNITY)
Admission: EM | Disposition: A | Discharge status: Home / Self Care | Payer: Self-pay | Source: Home / Self Care | Attending: Emergency Medicine

## 2012-11-24 DIAGNOSIS — I517 Cardiomegaly: Secondary | ICD-10-CM

## 2012-11-24 DIAGNOSIS — I2510 Atherosclerotic heart disease of native coronary artery without angina pectoris: Secondary | ICD-10-CM

## 2012-11-24 HISTORY — PX: LEFT HEART CATHETERIZATION WITH CORONARY ANGIOGRAM: SHX5451

## 2012-11-24 HISTORY — PX: CARDIAC CATHETERIZATION: SHX172

## 2012-11-24 LAB — COMPREHENSIVE METABOLIC PANEL
ALT: 43 U/L (ref 0–53)
Albumin: 3.5 g/dL (ref 3.5–5.2)
Alkaline Phosphatase: 74 U/L (ref 39–117)
BUN: 13 mg/dL (ref 6–23)
Calcium: 8.9 mg/dL (ref 8.4–10.5)
GFR calc Af Amer: >90 mL/min (ref >90)
Potassium: 3.8 mEq/L (ref 3.5–5.1)
Sodium: 142 mEq/L (ref 135–145)
Total Protein: 7 g/dL (ref 6.0–8.3)

## 2012-11-24 LAB — CBC
Hemoglobin: 15.3 g/dL (ref 13.0–17.0)
Platelets: 163 10*3/uL (ref 150–400)
RBC: 4.75 MIL/uL (ref 4.22–5.81)

## 2012-11-24 LAB — LIPID PANEL
Cholesterol: 149 mg/dL (ref 0–200)
HDL: 31 mg/dL — ABNORMAL LOW (ref >39)
Total CHOL/HDL Ratio: 4.8 RATIO

## 2012-11-24 LAB — HEMOGLOBIN A1C: Hgb A1c MFr Bld: 7.6 % — ABNORMAL HIGH (ref <5.7)

## 2012-11-24 LAB — PROTIME-INR: INR: 1.01 (ref 0.00–1.49)

## 2012-11-24 LAB — HEPARIN LEVEL (UNFRACTIONATED): Heparin Unfractionated: 0.35 IU/mL (ref 0.30–0.70)

## 2012-11-24 LAB — TROPONIN I: Troponin I: <0.3 ng/mL (ref <0.30)

## 2012-11-24 SURGERY — LEFT HEART CATHETERIZATION WITH CORONARY ANGIOGRAM
Anesthesia: LOCAL

## 2012-11-24 MED ORDER — ISOSORBIDE MONONITRATE ER 30 MG PO TB24
30.0000 mg | ORAL_TABLET | Freq: Every day | ORAL | Status: DC
  Administered 2012-11-24 – 2012-11-25 (×2): 30 mg via ORAL
  Filled 2012-11-24 (×3): qty 1

## 2012-11-24 MED ORDER — SODIUM CHLORIDE 0.9 % IV SOLN
INTRAVENOUS | Status: AC
Start: 2012-11-24 — End: 2012-11-24

## 2012-11-24 MED ORDER — LIDOCAINE HCL (PF) 1 % IJ SOLN
INTRAMUSCULAR | Status: AC
  Filled 2012-11-24: qty 30

## 2012-11-24 MED ORDER — PRAMIPEXOLE DIHYDROCHLORIDE 0.25 MG PO TABS
0.5000 mg | ORAL_TABLET | Freq: Two times a day (BID) | ORAL | Status: DC
  Administered 2012-11-24 – 2012-11-25 (×3): 0.5 mg via ORAL
  Filled 2012-11-24 (×5): qty 2

## 2012-11-24 MED ORDER — HEPARIN SODIUM (PORCINE) 1000 UNIT/ML IJ SOLN
INTRAMUSCULAR | Status: AC
  Filled 2012-11-24: qty 1

## 2012-11-24 MED ORDER — VERAPAMIL HCL 2.5 MG/ML IV SOLN
INTRAVENOUS | Status: AC
  Filled 2012-11-24: qty 2

## 2012-11-24 MED ORDER — FENTANYL CITRATE 0.05 MG/ML IJ SOLN
INTRAMUSCULAR | Status: AC
  Filled 2012-11-24: qty 2

## 2012-11-24 MED ORDER — HEPARIN (PORCINE) IN NACL 2-0.9 UNIT/ML-% IJ SOLN
INTRAMUSCULAR | Status: AC
  Filled 2012-11-24: qty 1000

## 2012-11-24 MED ORDER — MIDAZOLAM HCL 2 MG/2ML IJ SOLN
INTRAMUSCULAR | Status: AC
  Filled 2012-11-24: qty 2

## 2012-11-24 NOTE — Progress Notes (Signed)
Pt's HR decreased intermittently to the high 30's. Pt asymptomatic, asleep, BP 100/72. Pt had refused CPAP earlier in the night and pt had apneic episodes that coincided with decreased HR. Pt encouraged to wear CPAP and RT informed. Will continue to monitor.

## 2012-11-24 NOTE — Progress Notes (Signed)
Echocardiogram 2D Echocardiogram has been performed.  [name] 11/24/2012, 4:12 PM

## 2012-11-24 NOTE — CV Procedure (Signed)
Cardiac Catheterization Operative Report  ALBERTUS CHIARELLI [id number] 4/9/20149:18 AM Lucila Maine, MD  Procedure Performed:  1. Left Heart Catheterization 2. Selective Coronary Angiography 3. Left ventricular angiogram  Operator: Verne Carrow, MD  Arterial access site:  Right radial artery.   Indication:  57 yo male with history of CAD with occluded RCA, severe aneurysms in the LAD and Circumflex with bare metal stent in aneurysmal segment of OM1 in 2012 who is readmitted with dyspnea. Cardiac markers negative.                                    Procedure Details: The risks, benefits, complications, treatment options, and expected outcomes were discussed with the patient. The patient and/or family concurred with the proposed plan, giving informed consent. The patient was brought to the cath lab after IV hydration was begun and oral premedication was given. The patient was further sedated with Versed and Fentanyl. The right wrist was assessed with an Allens test which was positive. The right wrist was prepped and draped in a sterile fashion. 1% lidocaine was used for local anesthesia. Using the modified Seldinger access technique, a 5 French sheath was placed in the right radial artery. 3 mg Verapamil was given through the sheath. 5000 units IV heparin was given. Standard diagnostic catheters were used to perform selective coronary angiography. A pigtail catheter was used to perform a left ventricular angiogram. The sheath was removed from the right radial artery and a Terumo hemostasis band was applied at the arteriotomy site on the right wrist.    There were no immediate complications. The patient was taken to the recovery area in stable condition.   Hemodynamic Findings: Central aortic pressure: 116/66 Left ventricular pressure: 124/13/18  Angiographic Findings:  Left main:  No obstructive disease noted.   Left Anterior Descending: Large caliber vessel that courses to  the apex and gives off 2 diagonal branches. The ostium of the left anterior descending artery appeared to have a 40-50% stenosis. The proximal segment had several aneurysmal segments with nonobstructive plaque. The midvessel had a 40% stenosis. The distal vessel was patent and had mild plaque disease. The first diagonal was moderate size with an ostial 60-70% stenosis (1.34mm vessel). The second diagonal was very small in caliber. The left anterior descending artery did wrap around the apex.   Circumflex Artery: Large caliber vessel with large aneurysmal segments in the proximal and mid vessel extending into the obtuse marginal branch. There is a proximal 40% stenosis. 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 There is a patent stent extending from the aneurysmal segment in the OM branch. The stent is widely patent with no restenosis. The distal circumflex artery has diffuse plaque but no focally obstructive  lesions.   Right Coronary Artery: Large, dominant vessel with serial 99% lesions throughout its proximal mid segment. The mid vessel has 100% occlusion. This is known to be a chronically occluded vessel. The distal vessel fills from left-to-right collaterals.   Left ventricular angiogram: Moderate left ventricular systolic dysfunction with ejection fraction of 40%. There is hypokinesis of the inferior wall.  Impression: 1. Severe double vessel CAD-stable 2. Moderate LV systolic dysfunction  Recommendations: Will continue medical management. Will arrange echo today to exclude valvular disease and get another estimate for LVEF. Consider adding long acting nitrates.        Complications:  None. The patient  tolerated the procedure well.

## 2012-11-24 NOTE — Consult Note (Signed)
ANTICOAGULATION CONSULT NOTE  Pharmacy Consult for Heparin Indication: Chest pain  Allergies: Allergies  Allergen Reactions  . Lipitor (Atorvastatin Calcium)   . Niaspan (Niacin Er)     Height/Weight: Height: 5\' 10"  (177.8 cm) Weight: 219 lb 6.4 oz (99.519 kg) IBW/kg (Calculated) : 73 Dosing weight 94 kg  Vital Signs: BP 115/69  Pulse 61  Temp(Src) 97.6 F (36.4 C) (Oral)  Resp 16  Ht 5\' 10"  (1.778 m)  Wt 219 lb 6.4 oz (99.519 kg)  BMI 31.48 kg/m2  SpO2 96%  Labs:  Recent Labs  11/23/12 1808 11/24/12 0505  HGB 16.2 15.3  HCT 44.6 42.5  PLT 173 163  HEPARINUNFRC  --  0.35  CREATININE 0.88  --     Assessment: 57 y.o.male with chest pain for heparin  Goal of Therapy:  Heparin level 0.3-0.7 units/ml   Plan:  Continue Heparin at current rate F/U after cath  Geannie Risen, PharmD, BCPS 11/24/2012,  5:50 AM

## 2012-11-24 NOTE — Progress Notes (Signed)
SUBJECTIVE:  No discomfort since admission.     PHYSICAL EXAM Filed Vitals:   11/23/12 2300 11/23/12 2345 11/24/12 0259 11/24/12 0500  BP: 115/69   122/74  Pulse:  60 61 56  Temp: 97.6 F (36.4 C)   97.3 F (36.3 C)  TempSrc:      Resp: 16  16 18   Height: 5\' 10"  (1.778 m)     Weight: 219 lb 6.4 oz (99.519 kg)     SpO2: 96%  96% 92%   General:  No distress Lungs:  Clear Heart:  RRR Abdomen:  Positive bowel sounds, no rebound no guarding Extremities:  No edema  LABS: Lab Results  Component Value Date   CKTOTAL 200 05/13/2011   CKMB 5.6* 05/13/2011   TROPONINI <0.30 11/24/2012   Results for orders placed during the hospital encounter of 11/23/12 (from the past 24 hour(s))  CBC WITH DIFFERENTIAL     Status: Abnormal   Collection Time    11/23/12  6:08 PM      Result Value Range   WBC 10.9 (*) 4.0 - 10.5 K/uL   RBC 5.04  4.22 - 5.81 MIL/uL   Hemoglobin 16.2  13.0 - 17.0 g/dL   HCT 16.1  09.6 - 04.5 %   MCV 88.5  78.0 - 100.0 fL   MCH 32.1  26.0 - 34.0 pg   MCHC 36.3 (*) 30.0 - 36.0 g/dL   RDW 40.9  81.1 - 91.4 %   Platelets 173  150 - 400 K/uL   Neutrophils Relative 45  43 - 77 %   Neutro Abs 4.9  1.7 - 7.7 K/uL   Lymphocytes Relative 44  12 - 46 %   Lymphs Abs 4.8 (*) 0.7 - 4.0 K/uL   Monocytes Relative 7  3 - 12 %   Monocytes Absolute 0.8  0.1 - 1.0 K/uL   Eosinophils Relative 3  0 - 5 %   Eosinophils Absolute 0.3  0.0 - 0.7 K/uL   Basophils Relative 1  0 - 1 %   Basophils Absolute 0.1  0.0 - 0.1 K/uL  COMPREHENSIVE METABOLIC PANEL     Status: Abnormal   Collection Time    11/23/12  6:08 PM      Result Value Range   Sodium 138  135 - 145 mEq/L   Potassium 3.9  3.5 - 5.1 mEq/L   Chloride 103  96 - 112 mEq/L   CO2 23  19 - 32 mEq/L   Glucose, Bld 104 (*) 70 - 99 mg/dL   BUN 14  6 - 23 mg/dL   Creatinine, Ser 7.82  0.50 - 1.35 mg/dL   Calcium 9.9  8.4 - 95.6 mg/dL   Total Protein 7.8  6.0 - 8.3 g/dL   Albumin 4.1  3.5 - 5.2 g/dL   AST 37  0 - 37 U/L   ALT 49  0 - 53 U/L   Alkaline Phosphatase 79  39 - 117 U/L   Total Bilirubin 0.3  0.3 - 1.2 mg/dL   GFR calc non Af Amer >90  >90 mL/min   GFR calc Af Amer >90  >90 mL/min  POCT I-STAT TROPONIN I     Status: None   Collection Time    11/23/12  6:30 PM      Result Value Range   Troponin i, poc 0.01  0.00 - 0.08 ng/mL   Comment 3           PRO B  NATRIURETIC PEPTIDE     Status: None   Collection Time    11/23/12  6:55 PM      Result Value Range   Pro B Natriuretic peptide (BNP) 43.1  0 - 125 pg/mL  TROPONIN I     Status: None   Collection Time    11/23/12 11:14 PM      Result Value Range   Troponin I <0.30  <0.30 ng/mL  HEPARIN LEVEL (UNFRACTIONATED)     Status: None   Collection Time    11/24/12  5:05 AM      Result Value Range   Heparin Unfractionated 0.35  0.30 - 0.70 IU/mL  CBC     Status: Abnormal   Collection Time    11/24/12  5:05 AM      Result Value Range   WBC 11.0 (*) 4.0 - 10.5 K/uL   RBC 4.75  4.22 - 5.81 MIL/uL   Hemoglobin 15.3  13.0 - 17.0 g/dL   HCT 16.1  09.6 - 04.5 %   MCV 89.5  78.0 - 100.0 fL   MCH 32.2  26.0 - 34.0 pg   MCHC 36.0  30.0 - 36.0 g/dL   RDW 40.9  81.1 - 91.4 %   Platelets 163  150 - 400 K/uL  TROPONIN I     Status: None   Collection Time    11/24/12  5:05 AM      Result Value Range   Troponin I <0.30  <0.30 ng/mL  COMPREHENSIVE METABOLIC PANEL     Status: Abnormal   Collection Time    11/24/12  5:05 AM      Result Value Range   Sodium 142  135 - 145 mEq/L   Potassium 3.8  3.5 - 5.1 mEq/L   Chloride 108  96 - 112 mEq/L   CO2 23  19 - 32 mEq/L   Glucose, Bld 101 (*) 70 - 99 mg/dL   BUN 13  6 - 23 mg/dL   Creatinine, Ser 7.82  0.50 - 1.35 mg/dL   Calcium 8.9  8.4 - 95.6 mg/dL   Total Protein 7.0  6.0 - 8.3 g/dL   Albumin 3.5  3.5 - 5.2 g/dL   AST 32  0 - 37 U/L   ALT 43  0 - 53 U/L   Alkaline Phosphatase 74  39 - 117 U/L   Total Bilirubin 0.3  0.3 - 1.2 mg/dL   GFR calc non Af Amer >90  >90 mL/min   GFR calc Af Amer >90  >90  mL/min  LIPID PANEL     Status: Abnormal   Collection Time    11/24/12  5:05 AM      Result Value Range   Cholesterol 149  0 - 200 mg/dL   Triglycerides 213  <086 mg/dL   HDL 31 (*) >57 mg/dL   Total CHOL/HDL Ratio 4.8     VLDL 28  0 - 40 mg/dL   LDL Cholesterol 90  0 - 99 mg/dL  PROTIME-INR     Status: None   Collection Time    11/24/12  5:43 AM      Result Value Range   Prothrombin Time 13.2  11.6 - 15.2 seconds   INR 1.01  0.00 - 1.49   No intake or output data in the 24 hours ending 11/24/12 8469  ASSESSMENT AND PLAN:  Chest pain:  Ruled out.  However, given his high pretest probability.  The plan today is for  cath.   If cath OK he might need further work up for dyspnea with PFTs.    Bradycardia:  While asleep and associated with sleep apnea.  Continue low dose of beta blocker now although he has not tolerated this at home.   HTN:  HR OK     Rollene Rotunda 11/24/2012 7:22 AM

## 2012-11-24 NOTE — H&P (View-Only) (Signed)
 SUBJECTIVE:  No discomfort since admission.     PHYSICAL EXAM Filed Vitals:   11/23/12 2300 11/23/12 2345 11/24/12 0259 11/24/12 0500  BP: 115/69   122/74  Pulse:  60 61 56  Temp: 97.6 F (36.4 C)   97.3 F (36.3 C)  TempSrc:      Resp: 16  16 18  Height: 5' 10" (1.778 m)     Weight: 219 lb 6.4 oz (99.519 kg)     SpO2: 96%  96% 92%   General:  No distress Lungs:  Clear Heart:  RRR Abdomen:  Positive bowel sounds, no rebound no guarding Extremities:  No edema  LABS: Lab Results  Component Value Date   CKTOTAL 200 05/13/2011   CKMB 5.6* 05/13/2011   TROPONINI <0.30 11/24/2012   Results for orders placed during the hospital encounter of 11/23/12 (from the past 24 hour(s))  CBC WITH DIFFERENTIAL     Status: Abnormal   Collection Time    11/23/12  6:08 PM      Result Value Range   WBC 10.9 (*) 4.0 - 10.5 K/uL   RBC 5.04  4.22 - 5.81 MIL/uL   Hemoglobin 16.2  13.0 - 17.0 g/dL   HCT 44.6  39.0 - 52.0 %   MCV 88.5  78.0 - 100.0 fL   MCH 32.1  26.0 - 34.0 pg   MCHC 36.3 (*) 30.0 - 36.0 g/dL   RDW 13.1  11.5 - 15.5 %   Platelets 173  150 - 400 K/uL   Neutrophils Relative 45  43 - 77 %   Neutro Abs 4.9  1.7 - 7.7 K/uL   Lymphocytes Relative 44  12 - 46 %   Lymphs Abs 4.8 (*) 0.7 - 4.0 K/uL   Monocytes Relative 7  3 - 12 %   Monocytes Absolute 0.8  0.1 - 1.0 K/uL   Eosinophils Relative 3  0 - 5 %   Eosinophils Absolute 0.3  0.0 - 0.7 K/uL   Basophils Relative 1  0 - 1 %   Basophils Absolute 0.1  0.0 - 0.1 K/uL  COMPREHENSIVE METABOLIC PANEL     Status: Abnormal   Collection Time    11/23/12  6:08 PM      Result Value Range   Sodium 138  135 - 145 mEq/L   Potassium 3.9  3.5 - 5.1 mEq/L   Chloride 103  96 - 112 mEq/L   CO2 23  19 - 32 mEq/L   Glucose, Bld 104 (*) 70 - 99 mg/dL   BUN 14  6 - 23 mg/dL   Creatinine, Ser 0.88  0.50 - 1.35 mg/dL   Calcium 9.9  8.4 - 10.5 mg/dL   Total Protein 7.8  6.0 - 8.3 g/dL   Albumin 4.1  3.5 - 5.2 g/dL   AST 37  0 - 37 U/L   ALT 49  0 - 53 U/L   Alkaline Phosphatase 79  39 - 117 U/L   Total Bilirubin 0.3  0.3 - 1.2 mg/dL   GFR calc non Af Amer >90  >90 mL/min   GFR calc Af Amer >90  >90 mL/min  POCT I-STAT TROPONIN I     Status: None   Collection Time    11/23/12  6:30 PM      Result Value Range   Troponin i, poc 0.01  0.00 - 0.08 ng/mL   Comment 3           PRO B   NATRIURETIC PEPTIDE     Status: None   Collection Time    11/23/12  6:55 PM      Result Value Range   Pro B Natriuretic peptide (BNP) 43.1  0 - 125 pg/mL  TROPONIN I     Status: None   Collection Time    11/23/12 11:14 PM      Result Value Range   Troponin I <0.30  <0.30 ng/mL  HEPARIN LEVEL (UNFRACTIONATED)     Status: None   Collection Time    11/24/12  5:05 AM      Result Value Range   Heparin Unfractionated 0.35  0.30 - 0.70 IU/mL  CBC     Status: Abnormal   Collection Time    11/24/12  5:05 AM      Result Value Range   WBC 11.0 (*) 4.0 - 10.5 K/uL   RBC 4.75  4.22 - 5.81 MIL/uL   Hemoglobin 15.3  13.0 - 17.0 g/dL   HCT 42.5  39.0 - 52.0 %   MCV 89.5  78.0 - 100.0 fL   MCH 32.2  26.0 - 34.0 pg   MCHC 36.0  30.0 - 36.0 g/dL   RDW 13.2  11.5 - 15.5 %   Platelets 163  150 - 400 K/uL  TROPONIN I     Status: None   Collection Time    11/24/12  5:05 AM      Result Value Range   Troponin I <0.30  <0.30 ng/mL  COMPREHENSIVE METABOLIC PANEL     Status: Abnormal   Collection Time    11/24/12  5:05 AM      Result Value Range   Sodium 142  135 - 145 mEq/L   Potassium 3.8  3.5 - 5.1 mEq/L   Chloride 108  96 - 112 mEq/L   CO2 23  19 - 32 mEq/L   Glucose, Bld 101 (*) 70 - 99 mg/dL   BUN 13  6 - 23 mg/dL   Creatinine, Ser 0.93  0.50 - 1.35 mg/dL   Calcium 8.9  8.4 - 10.5 mg/dL   Total Protein 7.0  6.0 - 8.3 g/dL   Albumin 3.5  3.5 - 5.2 g/dL   AST 32  0 - 37 U/L   ALT 43  0 - 53 U/L   Alkaline Phosphatase 74  39 - 117 U/L   Total Bilirubin 0.3  0.3 - 1.2 mg/dL   GFR calc non Af Amer >90  >90 mL/min   GFR calc Af Amer >90  >90  mL/min  LIPID PANEL     Status: Abnormal   Collection Time    11/24/12  5:05 AM      Result Value Range   Cholesterol 149  0 - 200 mg/dL   Triglycerides 139  <150 mg/dL   HDL 31 (*) >39 mg/dL   Total CHOL/HDL Ratio 4.8     VLDL 28  0 - 40 mg/dL   LDL Cholesterol 90  0 - 99 mg/dL  PROTIME-INR     Status: None   Collection Time    11/24/12  5:43 AM      Result Value Range   Prothrombin Time 13.2  11.6 - 15.2 seconds   INR 1.01  0.00 - 1.49   No intake or output data in the 24 hours ending 11/24/12 0722  ASSESSMENT AND PLAN:  Chest pain:  Ruled out.  However, given his high pretest probability.  The plan today is for   cath.   If cath OK he might need further work up for dyspnea with PFTs.    Bradycardia:  While asleep and associated with sleep apnea.  Continue low dose of beta blocker now although he has not tolerated this at home.   HTN:  HR OK     Karene Bracken 11/24/2012 7:22 AM   

## 2012-11-24 NOTE — Interval H&P Note (Signed)
History and Physical Interval Note:  11/24/2012 8:58 AM  [name]  has presented today for surgery, with the diagnosis of cp  The various methods of treatment have been discussed with the patient and family. After consideration of risks, benefits and other options for treatment, the patient has consented to  Procedure(s): LEFT HEART CATHETERIZATION WITH CORONARY ANGIOGRAM (N/A) as a surgical intervention .  The patient's history has been reviewed, patient examined, no change in status, stable for surgery.  I have reviewed the patient's chart and labs.  Questions were answered to the patient's satisfaction.     [name]

## 2012-11-24 NOTE — Progress Notes (Signed)
Utilization review completed.  

## 2012-11-25 ENCOUNTER — Encounter (HOSPITAL_COMMUNITY): Payer: Self-pay | Admitting: Physician Assistant

## 2012-11-25 DIAGNOSIS — IMO0001 Reserved for inherently not codable concepts without codable children: Secondary | ICD-10-CM

## 2012-11-25 DIAGNOSIS — I2510 Atherosclerotic heart disease of native coronary artery without angina pectoris: Secondary | ICD-10-CM

## 2012-11-25 DIAGNOSIS — R001 Bradycardia, unspecified: Secondary | ICD-10-CM

## 2012-11-25 DIAGNOSIS — I10 Essential (primary) hypertension: Secondary | ICD-10-CM

## 2012-11-25 DIAGNOSIS — J42 Unspecified chronic bronchitis: Secondary | ICD-10-CM

## 2012-11-25 LAB — CBC
Hemoglobin: 14.8 g/dL (ref 13.0–17.0)
MCH: 31 pg (ref 26.0–34.0)
MCV: 88.9 fL (ref 78.0–100.0)
RBC: 4.78 MIL/uL (ref 4.22–5.81)

## 2012-11-25 MED ORDER — ISOSORBIDE MONONITRATE ER 30 MG PO TB24: 30.0000 mg | ORAL_TABLET | Freq: Every day | ORAL | Status: DC

## 2012-11-25 MED ORDER — LEVALBUTEROL TARTRATE 45 MCG/ACT IN AERO: 1.0000 | INHALATION_SPRAY | RESPIRATORY_TRACT | Status: DC | PRN

## 2012-11-25 MED ORDER — METFORMIN HCL 500 MG PO TABS: 500.0000 mg | ORAL_TABLET | Freq: Two times a day (BID) | ORAL | Status: DC

## 2012-11-25 NOTE — Discharge Summary (Signed)
Discharge Summary   Patient ID: [name],  MRN: [id number], DOB/AGE: 1956-06-24 57 y.o.  Admit date: 11/23/2012 Discharge date: 11/25/2012  Primary Physician: Lucila Maine, MD Primary Cardiologist: J. Telissa Palmisano, MD  Discharge Diagnoses Principal Problem:   Exertional chest pain Active Problems:   CAD (coronary artery disease)   COPD (chronic obstructive pulmonary disease)   Diabetes mellitus, new onset   Dyslipidemia   OSA (obstructive sleep apnea)   Essential hypertension   Bradycardia   Allergies Allergies  Allergen Reactions  . Lipitor (Atorvastatin Calcium)   . Niaspan (Niacin Er)     Diagnostic Studies/Procedures  PA/LATERAL CHEST X-RAY - 11/23/12  IMPRESSION:  Cardiomegaly with increased interstitial prominence versus mild early edema.  TRANSTHORACIC ECHOCARDIOGRAM - 11/24/12  - Left ventricle: The cavity size was mildly dilated. Wall thickness was increased in a pattern of mild LVH. Systolic function was normal. The estimated ejection fraction was in the range of 50% to 55%. - Left atrium: The atrium was mildly to moderately dilated.  CARDIAC CATHETERIZATION - 11/24/12  Central aortic pressure: 116/66  Left ventricular pressure: 124/13/18  Angiographic Findings:  Left main: No obstructive disease noted.  Left Anterior Descending: Large caliber vessel that courses to the apex and gives off 2 diagonal branches. The ostium of the left anterior descending artery appeared to have a 40-50% stenosis. The proximal segment had several aneurysmal segments with nonobstructive plaque. The midvessel had a 40% stenosis. The distal vessel was patent and had mild plaque disease. The first diagonal was moderate size with an ostial 60-70% stenosis (1.3mm vessel). The second diagonal was very small in caliber. The left anterior descending artery did wrap around the apex.  Circumflex Artery: Large caliber vessel with large aneurysmal segments in the proximal and mid vessel  extending into the obtuse marginal branch. There is a proximal 40% stenosis. 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 There is a patent stent extending from the aneurysmal segment in the OM branch. The stent is widely patent with no restenosis. The distal circumflex artery has diffuse plaque but no focally obstructive  lesions.  Right Coronary Artery: Large, dominant vessel with serial 99% lesions throughout its proximal mid segment. The mid vessel has 100% occlusion. This is known to be a chronically occluded vessel. The distal vessel fills from left-to-right collaterals.  Left ventricular angiogram: Moderate left ventricular systolic dysfunction with ejection fraction of 40%. There is hypokinesis of the inferior wall.  Impression:  1. Severe double vessel CAD-stable  2. Moderate LV systolic dysfunction  History of Present Illness  [name] is a 57 y.o. male who is admitted to [facility] on 11/23/12 with the above problem list.  He has a history of CAD status post bare-metal stent to OM and 2012 (known occluded RCA with failed angioplasty in the past), prior LV dysfunction (EF 45% in 2012), hypertension, COPD and sleep apnea.  He presented to the [facility] complaining of exertional chest pain and dyspnea on exertion times one week consistent with his prior angina. He had been compliant with all home medications.  In the ED, EKG revealed no evidence of ischemia. Chest x-ray as above revealed cardiomegaly with increased interstitial prominence versus mild early edema. ProBNP return within normal limits. He did have a mild leukocytosis at 10.9. And initial troponin returned negative. Given his cardiac history and symptoms concerning for unstable angina, the plan was made to admit the patient and proceed with diagnostic cardiac catheterization.  Hospital Course   He was started on IV heparin. Nitro paste was administered initially but was later removed due to  headache. Cardiac biomarkers were cycled overnight and returned within normal limits x3. Lipid profile revealed LDL 90, HDL 31, triglycerides 139 and total cholesterol 149. Hemoglobin A1c did return elevated at 7.6%.  He was informed, consented and prepped for cardiac catheterization which is outlined in full above. This revealed 40-50% ostial LAD, 40% mid LAD, 60-70% ostial D1, 40% proximal LCx, patent OM branch stent, 99% serial proximal-mid RCA lesions, 100% mid RCA occlusion (chronic with left to right collateralization); EF 40%, inferior wall hypokinesis.Patient's underlying coronary lesions were found to be stable and continue medical management was recommended. A long-acting nitrate was added.   A 2D echocardiogram was performed which revealed EF 55-60%, mild LVH, mild LV dilatation, mild-mod LA dilatation. He ambulated in the hallway without incident. He was noted to be bradycardic overnight which was associated with a sleep apnea. He was evaluated by Dr. Antoine Poche this morning and was deemed stable for discharge. His exertional dyspnea was felt to be due to his underlying COPD. Recommendation was made to discharge the patient on Xopenex inhaler and to discontinue BB. He will resume the medication regimen outlined below. He will be started on metformin to begin tomorrow for glycemic control. He has been advised to followup with his primary care provider for further management. He will followup in the office in 2-4 weeks. He is also been advised to followup with Dr. Craige Cotta for COPD management. This information, including post cath instructions and activity restrictions, has been clearly outlined in the discharge AVS.   Discharge Vitals:  Blood pressure 127/68, pulse 59, temperature 97.6 F (36.4 C), temperature source Oral, resp. rate 18, height 5\' 10"  (1.778 m), weight 101.424 kg (223 lb 9.6 oz), SpO2 97.00%.   Weight change: 1.831 kg (4 lb 0.6 oz)  Labs: Recent Labs     11/24/12  0505   11/25/12  0500  WBC  11.0*  10.0  HGB  15.3  14.8  HCT  42.5  42.5  MCV  89.5  88.9  PLT  163  162    Recent Labs Lab 11/23/12 1808 11/24/12 0505  NA 138 142  K 3.9 3.8  CL 103 108  CO2 23 23  BUN 14 13  CREATININE 0.88 0.93  CALCIUM 9.9 8.9  PROT 7.8 7.0  BILITOT 0.3 0.3  ALKPHOS 79 74  ALT 49 43  AST 37 32  GLUCOSE 104* 101*   Recent Labs     11/24/12  0505  HGBA1C  7.6*   Recent Labs     11/23/12  2314  11/24/12  0505  11/24/12  1108  TROPONINI  <0.30  <0.30  <0.30   Recent Labs     11/24/12  0505  CHOL  149  HDL  31*  LDLCALC  90  TRIG  139  CHOLHDL  4.8   Disposition:  Discharge Orders   Future Appointments Provider Department Dept Phone   12/13/2012 10:00 AM Rosalio Macadamia, NP  [facility] [place]) [phone]   Future Orders Complete By Expires     Diet - low sodium heart healthy  As directed     Increase activity slowly  As directed           Follow-up Information   Follow up with SOOD,VINEET, MD. Schedule an appointment as soon as possible for a visit in 2 weeks. (For follow-up of  COPD. )    Contact information:   [address] [place] [address] [phone]       Follow up with Norma Fredrickson, NP On 12/13/2012. (At 10:00 AM for post-hospital follow-up. )    Contact information:   [address] [place] [address] [phone]       Discharge Medications:    Medication List    TAKE these medications       aspirin 81 MG tablet  Take 81 mg by mouth every evening.     ipratropium 17 MCG/ACT inhaler  Commonly known as:  ATROVENT HFA  Inhale 2 puffs into the lungs daily as needed for wheezing (uses in the evening if needed).     isosorbide mononitrate 30 MG 24 hr tablet  Commonly known as:  IMDUR  Take 1 tablet (30 mg total) by mouth daily.     levalbuterol 45 MCG/ACT inhaler  Commonly known as:  XOPENEX HFA  Inhale 1-2 puffs into the lungs every 4 (four) hours as needed for  wheezing.     metFORMIN 500 MG tablet  Commonly known as:  GLUCOPHAGE  Take 1 tablet (500 mg total) by mouth 2 (two) times daily with a meal.  Start taking on:  11/26/2012     nitroGLYCERIN 0.4 MG SL tablet  Commonly known as:  NITROSTAT  Place 0.4 mg under the tongue every 5 (five) minutes as needed for chest pain.     pantoprazole 40 MG tablet  Commonly known as:  PROTONIX  Take 40 mg by mouth every evening.     pramipexole 1 MG tablet  Commonly known as:  MIRAPEX  Take 0.5 mg by mouth 2 (two) times daily.     prasugrel 10 MG Tabs  Commonly known as:  EFFIENT  Take 10 mg by mouth every morning.     ramipril 2.5 MG capsule  Commonly known as:  ALTACE  Take 2.5 mg by mouth every evening.     rosuvastatin 5 MG tablet  Commonly known as:  CRESTOR  Take 5 mg by mouth 3 (three) times a week. Takes at night on tue, thu, sat        Outstanding Labs/Studies: None  Duration of Discharge Encounter: Greater than 30 minutes including physician time.  Signed, R. Hurman Horn, PA-C 11/25/2012, 3:29 PM

## 2012-11-25 NOTE — Progress Notes (Signed)
Pt discharged to home per MD order. Pt received and reviewed all discharge instructions and medication information including follow-up appointments and prescriptions. Pt verbalized understanding. Pt alert and oriented at discharge with no complaints of pain. Pt ambulated to private vehicle via wheelchair per pt request. Ramos, Steve Colborn Elizabeth  

## 2012-11-25 NOTE — Progress Notes (Signed)
    SUBJECTIVE:  Walked in the hallway.  No acute symptoms.  Cath with results as recorded elsewhere.    PHYSICAL EXAM Filed Vitals:   11/24/12 1400 11/24/12 2137 11/25/12 0500 11/25/12 1011  BP: 103/58 120/69 131/68 127/68  Pulse: 58 64 62 59  Temp: 98 F (36.7 C) 98.1 F (36.7 C) 97.6 F (36.4 C)   TempSrc: Oral     Resp: 18 18 18    Height:      Weight:   223 lb 9.6 oz (101.424 kg)   SpO2: 95% 93% 97%    General:  No distress Lungs:  Clear Heart:  RRR Abdomen:  Positive bowel sounds, no rebound no guarding Extremities:  No edema, right wrist OK.    LABS:  Results for orders placed during the hospital encounter of 11/23/12 (from the past 24 hour(s))  CBC     Status: None   Collection Time    11/25/12  5:00 AM      Result Value Range   WBC 10.0  4.0 - 10.5 K/uL   RBC 4.78  4.22 - 5.81 MIL/uL   Hemoglobin 14.8  13.0 - 17.0 g/dL   HCT 16.1  09.6 - 04.5 %   MCV 88.9  78.0 - 100.0 fL   MCH 31.0  26.0 - 34.0 pg   MCHC 34.8  30.0 - 36.0 g/dL   RDW 40.9  81.1 - 91.4 %   Platelets 162  150 - 400 K/uL    Intake/Output Summary (Last 24 hours) at 11/25/12 1246 Last data filed at 11/25/12 0855  Gross per 24 hour  Intake    660 ml  Output    850 ml  Net   -190 ml    ASSESSMENT AND PLAN:  Chest pain/dypsnea:  Cath with nonobstructive disease and a patent sent.  No further cardiac work up.  The EF was 55% by echo.  BNP was normal.  Dyspnea not appearing to be cardiac.  He needs to see Dr. Craige Cotta next available to evaluate possible pulmonary etiology.  Discharge on Xopenex prn.  Bradycardia:  While asleep and associated with sleep apnea.  Discontinue beta blocker at discharge.   HTN:  Wilburn Cornelia 11/25/2012 12:46 PM

## 2012-11-29 ENCOUNTER — Telehealth: Payer: Self-pay | Admitting: *Deleted

## 2012-11-29 NOTE — Telephone Encounter (Signed)
TCM Patient:  F/u appt on 12/13/12 at 10:00am with Steve Fredrickson, NP

## 2012-11-30 ENCOUNTER — Telehealth: Payer: Self-pay | Admitting: Cardiology

## 2012-11-30 ENCOUNTER — Telehealth: Payer: Self-pay | Admitting: Pulmonary Disease

## 2012-11-30 NOTE — Telephone Encounter (Signed)
Left message yesterday, 11/29/2012 and today for pt to call back.

## 2012-11-30 NOTE — Telephone Encounter (Signed)
If he is having acute problems, then he can be seen by Rubye Oaks for acute visit to address immediate symptom management, and then follow up with me for further assessment on 12/28/12.

## 2012-11-30 NOTE — Telephone Encounter (Signed)
Follow-up Information  Follow up with SOOD,VINEET, MD. Schedule an appointment as soon as possible for a visit in 2 weeks. (For follow-up of COPD. )  ---  Dr. Craige Cotta you have only seen pt for sleep. Last seen 2012. No other physicians have any openings for consults either. Please advise thanks

## 2012-11-30 NOTE — Telephone Encounter (Signed)
*  TCM*; Spoke with patient's wife who has concerns about Isosorbide giving patient severe headaches.  Patient's wife instructed that he can take Isosorbide 1/2 tab for the next 3 days to see if he can tolerate. Wife verbalized understanding and will call us Friday if patient cannot tolerate.  Instructed  to take Tylenol for headaches.  Wife also had questions about patient's blood sugar; states she was not given instructions on checking his blood sugar when he was D/C'ed from hospital.  States she wants to buy a glucometer so that she can better manage his levels.  Gave wife normal range for blood glucose and to give patient orange juice if he becomes symptomatic for hypoglycemia.  Aware of patient's appointment with Norma Fredrickson, NP on 4/28 @ 1000.

## 2012-11-30 NOTE — Telephone Encounter (Signed)
New Prob     Has some questions regarding METFORMIN and ISOSORBIDE (giving him migraines). In addition, believes he has a sinus infection-coughing up yellow stuff. Would like to speak to nurse.

## 2012-12-01 NOTE — Telephone Encounter (Signed)
Double-checked with TP, this is fine to schedule LMOM TCB x1 to schedule acute visit for pt VS still has no openings for next week and TP is off today and tomorrow

## 2012-12-01 NOTE — Telephone Encounter (Signed)
Set pt an appt w/ TP on 12/06/12 @ 9:30 AM.  Pt verbalized understanding & states nothing further needed at this time.  [name]

## 2012-12-01 NOTE — Telephone Encounter (Signed)
Called pt's home # - [email or web address]  Called his cell #.  He answered the phone but did not have great service.  I could not hear him and phone kept breaking up.  Call was then disconnected. I called cell # back and [email or web address]

## 2012-12-06 ENCOUNTER — Encounter: Payer: Self-pay | Admitting: Adult Health

## 2012-12-06 ENCOUNTER — Ambulatory Visit (INDEPENDENT_AMBULATORY_CARE_PROVIDER_SITE_OTHER): Payer: 59 | Admitting: Adult Health

## 2012-12-06 VITALS — BP 128/82 | HR 64 | Temp 96.9°F | Ht 70.0 in | Wt 217.6 lb

## 2012-12-06 DIAGNOSIS — G4733 Obstructive sleep apnea (adult) (pediatric): Secondary | ICD-10-CM

## 2012-12-06 DIAGNOSIS — J449 Chronic obstructive pulmonary disease, unspecified: Secondary | ICD-10-CM

## 2012-12-06 NOTE — Assessment & Plan Note (Addendum)
Continue on BIPAP  Obtain BIPAP download. -once obtain look to see if ONO +/- titration study  Wt loss encouraged.  follow up as planned with Dr. Craige Cotta  In 3 weeks

## 2012-12-06 NOTE — Progress Notes (Signed)
Subjective:    Patient ID: [name], male    DOB: 16-Oct-1955, 57 y.o.   MRN: [id number]  HPI 57 yo male with known hx of OSA on CPAP  Former smoker , quit 2007 (1-1.5 PPD x 60yrs )  Hx of CAD s/p PCI /stent and LV dysfunction (recent echo 2014- mild LVH/EF 50% , RA mild -mod dilated  followed by Dr. Antoine Poche   12/06/2012 Post Hospital follow up  Pt was recent admitted for exertional chest pain. He has known CAD w/ prev stent in 2012 (known occluded RCA w/ failed angioplasty in past). He was ruled out for MI . Underwent cath w/ coronary lesions found to be stable . Imdur was added.   He has a known hx of OSA on BIPAP (last press-19/15). Wears most nights. Has not been seen since 06/2011 with Dr. Craige Cotta  .  Wears BIPAP  most nights .  Says he has a hx of smoking quit 2007 from hx of 30 yr 1.5 PPD . 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 Mows own grass.  Does get winded easily . Prior to admit severe DOE w/ associated chest pain.  Has noticed a decline over last several months with activity tolerance.  Has mild cough on /off , no wheezing .  +orthopnea / requiring 2 pillows - chronic  No leg swelling.  BNP not elevated during recent admit  No extensive travel . No unusual hobbies .  Not previously on amiodarone .  CXR recently showed CM w/ increased interstitial prominence ? Edema .  Ambulatory walk without significant desats - down 91% on RA  Pt referred from cardiology to evaluate for possible COPD    SH:  Married, 1 child-adult  Former smoker quit 2007 , 1.5 PPD x 30 yrs  No etoh or drugs  Work Conservation officer, historic buildings.      Past Medical History  Diagnosis Date  . ASCVD (arteriosclerotic cardiovascular disease)   . MI (myocardial infarction)     Review of Systems Constitutional:   No  weight loss, night sweats,  Fevers, chills, +fatigue  HEENT:   No headaches,  Difficulty swallowing,   Tooth/dental problems, or  Sore throat,                No sneezing, itching, ear ache, nasal congestion, post nasal drip,   CV:  No chest pain,   +Orthopnea, PND,  NO swelling in lower extremities, anasarca, dizziness, palpitations, syncope.   GI  No heartburn, indigestion, abdominal pain, nausea, vomiting, diarrhea, change in bowel habits, loss of appetite, bloody stools.   Resp:  +shortness of breath with exertion   No excess mucus, no productive cough,  No non-productive cough,  No coughing up of blood.  No change in color of mucus.  No wheezing.  No chest wall deformity  Skin: no rash or lesions.  GU: no dysuria, change in color of urine, no urgency or frequency.  No flank pain, no hematuria   MS:  No joint pain or swelling.  No decreased range of motion.  No back pain.  Psych:  No change in mood or affect. No depression or anxiety.  No memory loss.          Objective:   Physical Exam GEN: A/Ox3; pleasant , NAD, obese   HEENT:  [place]/AT,  EACs-clear, TMs-wnl, NOSE-clear, THROAT-clear, no lesions, no postnasal drip or  exudate noted.   NECK:  Supple w/ fair ROM; no JVD; normal carotid impulses w/o bruits; no thyromegaly or nodules palpated; no lymphadenopathy.  RESP  Clear  P & A; w/o, wheezes/ rales/ or rhonchi.no accessory muscle use, no dullness to percussion  CARD:  RRR, no m/r/g  , no peripheral edema, pulses intact, no cyanosis or clubbing.  GI:   Soft & nt; nml bowel sounds; no organomegaly or masses detected.  Musco: Warm bil, no deformities or joint swelling noted.   Neuro: alert, no focal deficits noted.    Skin: Warm, no lesions or rashes         Assessment & Plan:

## 2012-12-06 NOTE — Progress Notes (Signed)
Reviewed and agree with assessment/plan. 

## 2012-12-06 NOTE — Assessment & Plan Note (Addendum)
?   COPD w/ hx of smoking .  Will obtain PFT and decide on inhalers once this is completed.  follow up 1 week with PFT w/ Dr. Craige Cotta  Or [place]

## 2012-12-06 NOTE — Patient Instructions (Addendum)
Continue on BPAP At bedtime   Weight loss is important  Follow up in 1  weeks with PFT with Dr. Craige Cotta  Or [place]  Please contact office for sooner follow up if symptoms do not improve or worsen or seek emergency care

## 2012-12-13 ENCOUNTER — Ambulatory Visit (INDEPENDENT_AMBULATORY_CARE_PROVIDER_SITE_OTHER): Payer: 59 | Admitting: Nurse Practitioner

## 2012-12-13 ENCOUNTER — Ambulatory Visit (INDEPENDENT_AMBULATORY_CARE_PROVIDER_SITE_OTHER): Payer: 59 | Admitting: Adult Health

## 2012-12-13 ENCOUNTER — Encounter: Payer: Self-pay | Admitting: Adult Health

## 2012-12-13 ENCOUNTER — Ambulatory Visit (INDEPENDENT_AMBULATORY_CARE_PROVIDER_SITE_OTHER): Payer: 59 | Admitting: Pulmonary Disease

## 2012-12-13 ENCOUNTER — Encounter: Payer: Self-pay | Admitting: Nurse Practitioner

## 2012-12-13 VITALS — BP 122/82 | HR 60 | Temp 96.8°F | Ht 67.0 in | Wt 224.0 lb

## 2012-12-13 VITALS — BP 140/68 | HR 52 | Ht 70.0 in | Wt 224.0 lb

## 2012-12-13 DIAGNOSIS — G4733 Obstructive sleep apnea (adult) (pediatric): Secondary | ICD-10-CM

## 2012-12-13 DIAGNOSIS — J449 Chronic obstructive pulmonary disease, unspecified: Secondary | ICD-10-CM

## 2012-12-13 DIAGNOSIS — I2510 Atherosclerotic heart disease of native coronary artery without angina pectoris: Secondary | ICD-10-CM

## 2012-12-13 DIAGNOSIS — J4489 Other specified chronic obstructive pulmonary disease: Secondary | ICD-10-CM

## 2012-12-13 NOTE — Addendum Note (Signed)
Addended by: Boone Master E on: 12/13/2012 03:56 PM   Modules accepted: Orders

## 2012-12-13 NOTE — Assessment & Plan Note (Signed)
Continue on CPAP  CPAP Download requested  Wt loss encouraged Follow up 1 month and As needed

## 2012-12-13 NOTE — Progress Notes (Signed)
[name] Date of Birth: 1955-08-22 Medical Record #[id number]  History of Present Illness: [name] is seen back today for a post hospital visit. Seen for Dr. Antoine Poche. He has known CAD with BMS to OM in 2012 with known occluded RCA with failed angioplasty in the past, prior LV dysfunction (EF 45% in 2012), HT, COPD and sleep apnea  Recently admitted with chest pain. Underwent cardiac cath with results as noted below. Managed medically. Placed on long acting nitrate therapy. Echo has shown EF at 55 to 60% but was 40% by cath. Beta blocker was stopped due to bradycardia and Xopenex was added for his exertional dyspnea.   He comes back today. He is here with his wife. Doing ok. Feels better. No more chest pain. Breathing has improved somewhat. Sees pulmonary later today. BP at home is very good. Not able to tolerate the Imdur due to significant headache - tried cutting in half, taking at night, etc but to no avail. Has stopped it altogether and was feeling better anyway. Back walking 2 miles per day. No problems with his cath site - right wrist.   Current Outpatient Prescriptions on File Prior to Visit  Medication Sig Dispense Refill  . aspirin 81 MG tablet Take 81 mg by mouth every evening.       Marland Kitchen ipratropium (ATROVENT HFA) 17 MCG/ACT inhaler Inhale 2 puffs into the lungs daily as needed for wheezing (uses in the evening if needed).       Marland Kitchen levalbuterol (XOPENEX HFA) 45 MCG/ACT inhaler Inhale 1-2 puffs into the lungs every 4 (four) hours as needed for wheezing.  1 Inhaler  12  . metFORMIN (GLUCOPHAGE) 500 MG tablet Take 1 tablet (500 mg total) by mouth 2 (two) times daily with a meal.  60 tablet  3  . nitroGLYCERIN (NITROSTAT) 0.4 MG SL tablet Place 0.4 mg under the tongue every 5 (five) minutes as needed for chest pain.       . pantoprazole (PROTONIX) 40 MG tablet Take 40 mg by mouth every evening.      . pramipexole (MIRAPEX) 1 MG tablet Take 0.5 mg by mouth 2 (two) times daily.        . prasugrel (EFFIENT) 10 MG TABS Take 10 mg by mouth every morning.      . ramipril (ALTACE) 2.5 MG capsule Take 2.5 mg by mouth every evening.      . rosuvastatin (CRESTOR) 5 MG tablet Take 5 mg by mouth 3 (three) times a week. Takes at night on tue, thu, sat       No current facility-administered medications on file prior to visit.    Allergies  Allergen Reactions  . Lipitor (Atorvastatin Calcium)   . Niaspan (Niacin Er)     Past Medical History  Diagnosis Date  . ASCVD (arteriosclerotic cardiovascular disease)     a. MI 2004, did not seek care at that time. b. s/p PTCA/BMS to OM1 04/2011 (known totally occluded RCA with L-R collaterals and failed angioplasty on that vessel).  . MI (myocardial infarction)     x2  . Hyperlipidemia   . OSA (obstructive sleep apnea) 05/24/11  . Restless leg syndrome 06/25/2011  . LV dysfunction     EF 45% by cath 2012  . HTN (hypertension)   . COPD (chronic obstructive pulmonary disease)     /asthma  . Type 2 diabetes mellitus     Hgb A1C 7.6% 11/24/12    Past Surgical History  Procedure  Laterality Date  . Tonsillectomy and adenoidectomy    . Cystectomy      Spine and jaw  . Lesion removed from foot      right foot  . Coronary stent placement  04/2011  . Cardiac catheterization  11/24/2012    40-50% ostial LAD, 40% mid LAD, 60-70% ostial D1, 40% proximal LCx, patent OM branch stent, 99% serial proximal-mid RCA lesions, 100% mid RCA occlusion (chronic with left to right collateralization); EF 40%, inferior wall hypokinesis.    History  Smoking status  . Former Smoker -- 1.50 packs/day for 35 years  . Types: Cigarettes  . Quit date: 08/18/2005  Smokeless tobacco  . Never Used    History  Alcohol Use No    Family History  Problem Relation Age of Onset  . Lymphoma Mother   . Heart attack Father 29  . Heart attack Brother 51    Review of Systems: The review of systems is per the HPI.  All other systems were reviewed and are  negative.  Physical Exam: BP 140/68  Pulse 52  Ht 5\' 10"  (1.778 m)  Wt 224 lb (101.606 kg)  BMI 32.14 kg/m2 Patient is very pleasant and in no acute distress. Skin is warm and dry. Color is normal.  HEENT is unremarkable. Normocephalic/atraumatic. PERRL. Sclera are nonicteric. Neck is supple. No masses. No JVD. Lungs are clear. Cardiac exam shows a regular rate and rhythm. Abdomen is soft. Extremities are without edema. Gait and ROM are intact. No gross neurologic deficits noted.  LABORATORY DATA:  Lab Results  Component Value Date   WBC 10.0 11/25/2012   HGB 14.8 11/25/2012   HCT 42.5 11/25/2012   PLT 162 11/25/2012   GLUCOSE 101* 11/24/2012   CHOL 149 11/24/2012   TRIG 139 11/24/2012   HDL 31* 11/24/2012   LDLCALC 90 11/24/2012   ALT 43 11/24/2012   AST 32 11/24/2012   NA 142 11/24/2012   K 3.8 11/24/2012   CL 108 11/24/2012   CREATININE 0.93 11/24/2012   BUN 13 11/24/2012   CO2 23 11/24/2012   INR 1.01 11/24/2012   HGBA1C 7.6* 11/24/2012   Angiographic Findings:   Left main: No obstructive disease noted.  Left Anterior Descending: Large caliber vessel that courses to the apex and gives off 2 diagonal branches. The ostium of the left anterior descending artery appeared to have a 40-50% stenosis. The proximal segment had several aneurysmal segments with nonobstructive plaque. The midvessel had a 40% stenosis. The distal vessel was patent and had mild plaque disease. The first diagonal was moderate size with an ostial 60-70% stenosis (1.69mm vessel). The second diagonal was very small in caliber. The left anterior descending artery did wrap around the apex.  Circumflex Artery: Large caliber vessel with large aneurysmal segments in the proximal and mid vessel extending into the obtuse marginal branch. There is a proximal 40% stenosis. 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 There is a patent stent extending from the aneurysmal segment in the OM branch. The stent is widely patent with no  restenosis. The distal circumflex artery has diffuse plaque but no focally obstructive  lesions.  Right Coronary Artery: Large, dominant vessel with serial 99% lesions throughout its proximal mid segment. The mid vessel has 100% occlusion. This is known to be a chronically occluded vessel. The distal vessel fills from left-to-right collaterals.   Left ventricular angiogram: Moderate left ventricular systolic dysfunction with ejection fraction of 40%. There is  hypokinesis of the inferior wall.   Impression:  1. Severe double vessel CAD-stable  2. Moderate LV systolic dysfunction  Recommendations: Will continue medical management. Will arrange echo today to exclude valvular disease and get another estimate for LVEF. Consider adding long acting nitrates.  Complications: None. The patient tolerated the procedure well.   Echo Study Conclusions  - Left ventricle: The cavity size was mildly dilated. Wall thickness was increased in a pattern of mild LVH. Systolic function was normal. The estimated ejection fraction was in the range of 50% to 55%. - Left atrium: The atrium was mildly to moderately dilated.   Assessment / Plan:  1. Chest pain - known CAD with recent cath - managed medically - intolerant to Imdur due to significant headaches - he is improved clinically.   2. Dyspnea - probably multifactorial - COPD, weight, etc. EF was 40% by cath and normal by echo. Not on beta blocker due to bradycardia. On low dose ACE -with good BP at home. Sees pulmonary later today.   3. DM - needs to see PCP in the next few weeks with a list of blood sugars.   Will get him back to see Dr. Antoine Poche in July.   Patient is agreeable to this plan and will call if any problems develop in the interim.   Rosalio Macadamia, RN, ANP-C [facility] [address] [place], [place]  [address]

## 2012-12-13 NOTE — Assessment & Plan Note (Signed)
NO significant obstructive lung disease noted.  Preserved lung function despite smoking hx  DLCO  Is reduced.- no desats w/ walking.  Cont on CPAP At bedtime  , once Download done.  Consider ONO on CPAP to make sure no desats.  (as he was suppose to be on BIPAP in past but insurance wound not cover. )

## 2012-12-13 NOTE — Progress Notes (Signed)
Subjective:    Patient ID: [name], male    DOB: 12-06-1955, 57 y.o.   MRN: [id number]  HPI  57 yo male with known hx of OSA on CPAP  Former smoker , quit 2007 (1-1.5 PPD x 11yrs )  Hx of CAD s/p PCI /stent and LV dysfunction (recent echo 2014- mild LVH/EF 50% , RA mild -mod dilated  followed by Dr. Antoine Poche   12/06/2012 Post Hospital follow up  Pt was recent admitted for exertional chest pain. He has known CAD w/ prev stent in 2012 (known occluded RCA w/ failed angioplasty in past). He was ruled out for MI . Underwent cath w/ coronary lesions found to be stable . Imdur was added.   He has a known hx of OSA on BIPAP (last press-19/15). Unable to afford BIPAP d/t rental cost. Changed to CPAP w/ 100% coverage by his insurance [employer])   Wears most nights. Has not been seen since 06/2011 with Dr. Craige Cotta  .  Wears CPAP  most nights .  Says he has a hx of smoking quit 2007 from hx of 30 yr 1.5 PPD . 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 Mows own grass.  Does get winded easily . Prior to admit severe DOE w/ associated chest pain.  Has noticed a decline over last several months with activity tolerance.  Has mild cough on /off , no wheezing .  +orthopnea / requiring 2 pillows - chronic  No leg swelling.  BNP not elevated during recent admit  No extensive travel . No unusual hobbies .  Not previously on amiodarone .  CXR recently showed CM w/ increased interstitial prominence ? Edema .  Ambulatory walk without significant desats - down 91% on RA  Pt referred from cardiology to evaluate for possible COPD    12/13/2012 Follow up  Pt returns for 1 week follow up and PFT . PFT 12/13/2012 >Fev1  2.74 L (84%) , no change w/ BD, ratio 76 , DLCO 55%  We discussed his PFT results .   Says he is starting to feel some better w/ less dyspnea/DOE. He has restarted walking and activity tolerance is improving.   Wearing CPAP most  nights . Download not sent for CPAP as of yet. We discussed weight loss.        SH:  Married, 1 child-adult  Former smoker quit 2007 , 1.5 PPD x 30 yrs  No etoh or drugs  Work Conservation officer, historic buildings.      Past Medical History  Diagnosis Date  . ASCVD (arteriosclerotic cardiovascular disease)   . MI (myocardial infarction)     Review of Systems  Constitutional:   No  weight loss, night sweats,  Fevers, chills, +fatigue  HEENT:   No headaches,  Difficulty swallowing,  Tooth/dental problems, or  Sore throat,                No sneezing, itching, ear ache, nasal congestion, post nasal drip,   CV:  No chest pain,   Decreased Orthopnea, PND,  NO swelling in lower extremities, anasarca, dizziness, palpitations, syncope.   GI  No heartburn, indigestion, abdominal pain, nausea, vomiting, diarrhea, change in bowel habits, loss of appetite, bloody stools.   Resp:  +shortness of breath with exertion -decreased   No excess mucus, no productive cough,  No non-productive cough,  No coughing up of blood.  No change  in color of mucus.  No wheezing.  No chest wall deformity  Skin: no rash or lesions.  GU: no dysuria, change in color of urine, no urgency or frequency.  No flank pain, no hematuria   MS:  No joint pain or swelling.  No decreased range of motion.  No back pain.  Psych:  No change in mood or affect. No depression or anxiety.  No memory loss.          Objective:   Physical Exam  GEN: A/Ox3; pleasant , NAD, obese   HEENT:  /AT,  EACs-clear, TMs-wnl, NOSE-clear, THROAT-clear, no lesions, no postnasal drip or exudate noted.   NECK:  Supple w/ fair ROM; no JVD; normal carotid impulses w/o bruits; no thyromegaly or nodules palpated; no lymphadenopathy.  RESP  Clear  P & A; w/o, wheezes/ rales/ or rhonchi.no accessory muscle use, no dullness to percussion  CARD:  RRR, no m/r/g  , no peripheral edema, pulses intact, no cyanosis or clubbing.  GI:    Soft & nt; nml bowel sounds; no organomegaly or masses detected.  Musco: Warm bil, no deformities or joint swelling noted.   Neuro: alert, no focal deficits noted.    Skin: Warm, no lesions or rashes         Assessment & Plan:

## 2012-12-13 NOTE — Patient Instructions (Addendum)
Continue on BPAP At bedtime   Weight loss is important  Follow up in 3  weeks with Dr. Craige Cotta  As planned and. As needed   Please contact office for sooner follow up if symptoms do not improve or worsen or seek emergency care

## 2012-12-13 NOTE — Progress Notes (Signed)
PFT done today. 

## 2012-12-13 NOTE — Patient Instructions (Addendum)
Stay on your current medicines except for the Isosorbide  Dr. Antoine Poche will see you in August  Regular exercise is encouraged  Call the [facility] office at [phone] if you have any questions, problems or concerns.

## 2012-12-14 NOTE — Progress Notes (Signed)
Reviewed and agree with assessment/plan. 

## 2012-12-22 ENCOUNTER — Telehealth: Payer: Self-pay | Admitting: Pulmonary Disease

## 2012-12-22 NOTE — Telephone Encounter (Signed)
Auto CPAP 11/04/12 to 12/13/12 >> Used on 21 of 30 nights with average 4 hrs 48 min.  Average AHI 23 with mean CPAP 11 cm H2O and 90th percentile CPAP 14 cm H2O.  Will have my nurse inform pt that sleep apnea number better with CPAP compared to sleep study from 2012, but still elevated.  Will discuss in more detail at [facility] on 12/28/12.

## 2012-12-23 NOTE — Telephone Encounter (Signed)
Pt's wife Bonita Quin returned call.  She can be reached @ [phone]. Leanora Ivanoff

## 2012-12-23 NOTE — Telephone Encounter (Signed)
[id number] x1 

## 2012-12-24 NOTE — Telephone Encounter (Signed)
[id number] x1 

## 2012-12-24 NOTE — Telephone Encounter (Signed)
Spouse is aware of results. Nothing further was needed

## 2012-12-28 ENCOUNTER — Encounter: Payer: Self-pay | Admitting: Pulmonary Disease

## 2012-12-28 ENCOUNTER — Ambulatory Visit (INDEPENDENT_AMBULATORY_CARE_PROVIDER_SITE_OTHER): Payer: 59 | Admitting: Pulmonary Disease

## 2012-12-28 VITALS — BP 130/74 | HR 60 | Ht 69.5 in | Wt 223.4 lb

## 2012-12-28 DIAGNOSIS — G4733 Obstructive sleep apnea (adult) (pediatric): Secondary | ICD-10-CM

## 2012-12-28 DIAGNOSIS — J449 Chronic obstructive pulmonary disease, unspecified: Secondary | ICD-10-CM

## 2012-12-28 NOTE — Assessment & Plan Note (Signed)
He has hx of smoking, but recent PFT was not consistent with COPD.  He did have isolated diffusion defect, and this is likely related to obesity.

## 2012-12-28 NOTE — Patient Instructions (Signed)
Will arrange for change to CPAP setting Will call with report from CPAP machine after pressure change Follow up in 6 months

## 2012-12-28 NOTE — Assessment & Plan Note (Signed)
He is compliant with therapy and reports benefit.  Recent download showed continued elevation in AHI.  Will change his setting to auto CPAP range 10 to 20 cm H2O >> if AHI remains elevated, then he will likely need to transition to BiPAP.

## 2012-12-28 NOTE — Progress Notes (Signed)
Chief Complaint  Patient presents with  . COPD    Pt states his breathing has been doing fine. Pt c/o cough w/ occasional white phlem. Denies any wheezing, no chets tx. thinks cough is d/t allergies.   . Sleep Apnea    Pt states he uses CPAP machine everynight x 4 hrs a night. Pt denies any problems w/ mask/machine.     History of Present Illness: [name] is a 57 y.o. male former smoker with chronic bronchitis, and OSA.  He has been using CPAP.  This helps.  He still feels tired during the day.  He was previously tested with BiPAP, but his insurance would not pay for this.  His breathing is okay.  He gets occasional cough >> more so with pollen.  He uses his inhalers when he walks up hills. He exercises by walking for about 1.5 miles several times per week.  TESTS: Sleep study 05/24/11 at [facility] 59.7, SpO2 low 82%. BPAP titration 08/07/11 >> BPAP 19/15 cm H2O, +R/+S; Increased centrals with higher pressures Auto CPAP 11/04/12 to 12/13/12 >> Used on 21 of 30 nights with average 4 hrs 48 min. Average AHI 23 with mean CPAP 11 cm H2O and 90th percentile CPAP 14 cm H2O. PFT 12/13/2012 >Fev1  2.74 L (84%) , no change w/ BD, ratio 76 , DLCO 55%  [name]  has a past medical history of ASCVD (arteriosclerotic cardiovascular disease); MI (myocardial infarction); Hyperlipidemia; OSA (obstructive sleep apnea) (05/24/11); Restless leg syndrome (06/25/2011); LV dysfunction; HTN (hypertension); Type 2 diabetes mellitus; and Chronic bronchitis.  [name]  has past surgical history that includes Tonsillectomy and adenoidectomy; Cystectomy; lesion removed from foot; Coronary stent placement (04/2011); and Cardiac catheterization (11/24/2012).  Prior to Admission medications   Medication Sig Start Date End Date Taking? Authorizing Provider  aspirin 81 MG tablet Take 81 mg by mouth every evening.    Yes Historical Provider, MD  cetirizine (ZYRTEC) 10 MG tablet Take 10 mg by  mouth daily.   Yes Historical Provider, MD  levalbuterol [facility] HFA) 45 MCG/ACT inhaler Inhale 1-2 puffs into the lungs every 4 (four) hours as needed for wheezing. 11/25/12  Yes Roger A Arguello, PA-C  metFORMIN (GLUCOPHAGE) 500 MG tablet Take 1 tablet (500 mg total) by mouth 2 (two) times daily with a meal. 11/26/12  Yes Roger A Arguello, PA-C  nitroGLYCERIN (NITROSTAT) 0.4 MG SL tablet Place 0.4 mg under the tongue every 5 (five) minutes as needed for chest pain.    Yes Historical Provider, MD  pantoprazole (PROTONIX) 40 MG tablet Take 40 mg by mouth every evening.   Yes Historical Provider, MD  pramipexole (MIRAPEX) 1 MG tablet Take 0.5 mg by mouth 2 (two) times daily.   Yes Historical Provider, MD  prasugrel (EFFIENT) 10 MG TABS Take 10 mg by mouth every morning.   Yes Historical Provider, MD  ramipril (ALTACE) 2.5 MG capsule Take 2.5 mg by mouth every evening.   Yes Historical Provider, MD  rosuvastatin (CRESTOR) 5 MG tablet Take 5 mg by mouth 3 (three) times a week. Takes at night on tue, thu, sat   Yes Historical Provider, MD    Allergies  Allergen Reactions  . Lipitor (Atorvastatin Calcium)   . Niaspan (Niacin Er)      Physical Exam:  General - No distress ENT - No sinus tenderness, no oral exudate, no LAN Cardiac - s1s2 regular, no murmur Chest - No wheeze/rales/dullness Back - No focal tenderness  Abd - Soft, non-tender Ext - No edema Neuro - Normal strength Skin - No rashes Psych - normal mood, and behavior   Assessment/Plan:  Chesley Mires, MD [facility] Pager:  [phone]

## 2013-01-11 ENCOUNTER — Encounter: Payer: Self-pay | Admitting: Cardiology

## 2013-03-11 ENCOUNTER — Ambulatory Visit: Payer: 59 | Admitting: Cardiology

## 2013-03-28 ENCOUNTER — Telehealth: Payer: Self-pay | Admitting: *Deleted

## 2013-03-28 MED ORDER — PRASUGREL HCL 10 MG PO TABS: 10.0000 mg | ORAL_TABLET | Freq: Every morning | ORAL | Status: DC

## 2013-03-28 MED ORDER — RAMIPRIL 2.5 MG PO CAPS: 2.5000 mg | ORAL_CAPSULE | Freq: Every evening | ORAL | Status: DC

## 2013-03-28 MED ORDER — ROSUVASTATIN CALCIUM 5 MG PO TABS: 5.0000 mg | ORAL_TABLET | ORAL | Status: DC

## 2013-03-28 NOTE — Telephone Encounter (Signed)
Pt would like Dr. Antoine Poche to fill Mirapex and Protonix will route this to his Nurse to verfiy if its ok to fill sine its not a cardiac med

## 2013-03-29 ENCOUNTER — Other Ambulatory Visit: Payer: Self-pay

## 2013-03-29 MED ORDER — ROSUVASTATIN CALCIUM 5 MG PO TABS: 5.0000 mg | ORAL_TABLET | ORAL | Status: DC

## 2013-03-29 MED ORDER — PRASUGREL HCL 10 MG PO TABS: 10.0000 mg | ORAL_TABLET | Freq: Every morning | ORAL | Status: DC

## 2013-03-29 MED ORDER — RAMIPRIL 2.5 MG PO CAPS: 2.5000 mg | ORAL_CAPSULE | Freq: Every evening | ORAL | Status: DC

## 2013-03-29 MED ORDER — PRAMIPEXOLE DIHYDROCHLORIDE 1 MG PO TABS: 0.5000 mg | ORAL_TABLET | Freq: Two times a day (BID) | ORAL | Status: DC

## 2013-03-29 MED ORDER — PANTOPRAZOLE SODIUM 40 MG PO TBEC: 40.0000 mg | DELAYED_RELEASE_TABLET | Freq: Every evening | ORAL | Status: DC

## 2013-03-29 NOTE — Telephone Encounter (Signed)
Please have pt have Dr Lucila Maine (pt's PCP) fill non cardiac meds

## 2013-03-30 NOTE — Telephone Encounter (Signed)
Pt aware to contact pcp  

## 2013-04-22 ENCOUNTER — Ambulatory Visit: Payer: 59 | Admitting: Cardiology

## 2013-06-03 ENCOUNTER — Ambulatory Visit (INDEPENDENT_AMBULATORY_CARE_PROVIDER_SITE_OTHER): Payer: 59 | Admitting: Cardiology

## 2013-06-03 VITALS — BP 120/76 | HR 59 | Ht 70.0 in | Wt 232.0 lb

## 2013-06-03 DIAGNOSIS — I2510 Atherosclerotic heart disease of native coronary artery without angina pectoris: Secondary | ICD-10-CM

## 2013-06-03 NOTE — Patient Instructions (Addendum)
Please stop your Effient. Continue all other medications as listed  Follow up in 1 year with Dr Antoine Poche.  You will receive a letter in the mail 2 months before you are due.  Please call us when you receive this letter to schedule your follow up appointment.

## 2013-06-03 NOTE — Progress Notes (Signed)
HPI The patient presents for followup of his known coronary disease. Since he was last seen in this clinic he has had none of the symptoms that he had prior to a PCI of his obtuse marginal. He walks 2 miles daily. The patient denies any new symptoms such as chest discomfort, neck or arm discomfort. There has been no new shortness of breath, PND or orthopnea. There have been no reported palpitations, presyncope or syncope.   Unfortunately he takes 2 much and has gained weight.  Allergies  Allergen Reactions  . Lipitor [Atorvastatin Calcium]   . Niaspan [Niacin Er]     Current Outpatient Prescriptions  Medication Sig Dispense Refill  . aspirin 81 MG tablet Take 81 mg by mouth every evening.       . levalbuterol (XOPENEX HFA) 45 MCG/ACT inhaler Inhale 1-2 puffs into the lungs every 4 (four) hours as needed for wheezing.  1 Inhaler  12  . metFORMIN (GLUCOPHAGE) 500 MG tablet Take 1 tablet (500 mg total) by mouth 2 (two) times daily with a meal.  60 tablet  3  . nitroGLYCERIN (NITROSTAT) 0.4 MG SL tablet Place 0.4 mg under the tongue every 5 (five) minutes as needed for chest pain.       . pantoprazole (PROTONIX) 40 MG tablet Take 1 tablet (40 mg total) by mouth every evening.  30 tablet  0  . pramipexole (MIRAPEX) 1 MG tablet Take 0.5 tablets (0.5 mg total) by mouth 2 (two) times daily.  30 tablet  0  . prasugrel (EFFIENT) 10 MG TABS tablet Take 1 tablet (10 mg total) by mouth every morning.  90 tablet  3  . ramipril (ALTACE) 2.5 MG capsule Take 1 capsule (2.5 mg total) by mouth every evening.  90 capsule  3  . rosuvastatin (CRESTOR) 5 MG tablet Take 1 tablet (5 mg total) by mouth 3 (three) times a week. Takes at night on tue, thu, sat  144 tablet  3   No current facility-administered medications for this visit.    Past Medical History  Diagnosis Date  . ASCVD (arteriosclerotic cardiovascular disease)     a. MI 2004, did not seek care at that time. b. s/p PTCA/BMS to OM1 04/2011 (known  totally occluded RCA with L-R collaterals and failed angioplasty on that vessel).  . MI (myocardial infarction)     x2  . Hyperlipidemia   . OSA (obstructive sleep apnea) 05/24/11  . Restless leg syndrome 06/25/2011  . LV dysfunction     EF 45% by cath 2012  . HTN (hypertension)   . Type 2 diabetes mellitus     Hgb A1C 7.6% 11/24/12  . Chronic bronchitis     Past Surgical History  Procedure Laterality Date  . Tonsillectomy and adenoidectomy    . Cystectomy      Spine and jaw  . Lesion removed from foot      right foot  . Coronary stent placement  04/2011  . Cardiac catheterization  11/24/2012    40-50% ostial LAD, 40% mid LAD, 60-70% ostial D1, 40% proximal LCx, patent OM branch stent, 99% serial proximal-mid RCA lesions, 100% mid RCA occlusion (chronic with left to right collateralization); EF 40%, inferior wall hypokinesis.    ROS:  As stated in the HPI and negative for all other systems.  PHYSICAL EXAM BP 120/76  Pulse 59  Ht 5\' 10"  (1.778 m)  Wt 232 lb (105.235 kg)  BMI 33.29 kg/m2 GENERAL:  Well appearing NECK:  No jugular venous distention, waveform within normal limits, carotid upstroke brisk and symmetric, no bruits, no thyromegaly LUNGS:  Clear to auscultation bilaterally BACK:  No CVA tenderness CHEST:  Unremarkable HEART:  PMI not displaced or sustained,S1 and S2 within normal limits, no S3, no S4, no clicks, no rubs, no murmurs ABD:  Flat, positive bowel sounds normal in frequency in pitch, no bruits, no rebound, no guarding, no midline pulsatile mass, no hepatomegaly, no splenomegaly EXT:  2 plus pulses throughout, no edema, no cyanosis no clubbing   EKG:  Normal sinus rhythm, rate 59, old inferior infarct, inferior T-wave inversion,  Unchanged from previous. PVCs.  06/03/2013   ASSESSMENT AND PLAN  CAD (coronary artery disease) - The patient has no new sypmtoms.  No further cardiovascular testing is indicated.  We will continue with aggressive risk  reduction and meds as listed.   He can stop his Effient  Dyslipidemia -  He will continue the meds as listed.  He was given instructions to get a fasting lipid profile.   Sleep apnea -  He will continue with CPAP.    Hypertension -  His blood pressure is mildly elevated. We discussed weight loss and he will otherwise continue the meds as listed.   Obesity - We had a long discussion about diet and weight loss. He was given specific instructions.

## 2013-06-04 ENCOUNTER — Encounter: Payer: Self-pay | Admitting: Cardiology

## 2014-01-31 ENCOUNTER — Ambulatory Visit: Payer: Self-pay | Admitting: Podiatrist

## 2014-03-25 ENCOUNTER — Other Ambulatory Visit: Payer: Self-pay | Admitting: Cardiology

## 2014-07-27 ENCOUNTER — Encounter (HOSPITAL_COMMUNITY): Payer: Self-pay | Admitting: Cardiovascular Disease

## 2015-04-12 ENCOUNTER — Encounter: Payer: Self-pay | Admitting: Cardiology

## 2015-04-12 ENCOUNTER — Ambulatory Visit (INDEPENDENT_AMBULATORY_CARE_PROVIDER_SITE_OTHER): Payer: 59 | Admitting: Cardiology

## 2015-04-12 VITALS — BP 122/76 | HR 58 | Ht 70.0 in | Wt 205.0 lb

## 2015-04-12 DIAGNOSIS — I2510 Atherosclerotic heart disease of native coronary artery without angina pectoris: Secondary | ICD-10-CM | POA: Diagnosis not present

## 2015-04-12 DIAGNOSIS — I10 Essential (primary) hypertension: Secondary | ICD-10-CM

## 2015-04-12 DIAGNOSIS — R072 Precordial pain: Secondary | ICD-10-CM

## 2015-04-12 NOTE — Progress Notes (Signed)
HPI The patient presents for followup of his known coronary disease. Since I last saw him he has reported some discomfort. This is in his posterior neck and goes up behind his ears. It has been sporadic. One episode was associated with some diaphoresis. He's had occasional nitroglycerin when this has happened and it seems to improve his symptoms. However, he's able to do activities such as pushing a lawnmower which she's done recently and this does not cause any symptoms. He does not get the same kind of jaw discomfort that he was having previously with his coronary disease. He's not getting any new shortness of breath, PND or orthopnea. He's not getting any new weight gain or edema.   Allergies  Allergen Reactions  . Lipitor [Atorvastatin Calcium]   . Niaspan [Niacin Er]     Current Outpatient Prescriptions  Medication Sig Dispense Refill  . aspirin 81 MG tablet Take 81 mg by mouth every evening.     . levalbuterol (XOPENEX HFA) 45 MCG/ACT inhaler Inhale 1-2 puffs into the lungs every 4 (four) hours as needed for wheezing. 1 Inhaler 12  . metFORMIN (GLUCOPHAGE) 500 MG tablet Take 1 tablet (500 mg total) by mouth 2 (two) times daily with a meal. (Patient taking differently: Take 1,000 mg by mouth 2 (two) times daily with a meal. ) 60 tablet 3  . nitroGLYCERIN (NITROSTAT) 0.4 MG SL tablet Place 0.4 mg under the tongue every 5 (five) minutes as needed for chest pain.     . pantoprazole (PROTONIX) 40 MG tablet Take 1 tablet (40 mg total) by mouth every evening. 30 tablet 0  . pramipexole (MIRAPEX) 1 MG tablet Take 0.5 tablets (0.5 mg total) by mouth 2 (two) times daily. (Patient taking differently: Take 1 mg by mouth 2 (two) times daily. ) 30 tablet 0  . ramipril (ALTACE) 2.5 MG capsule Take 1 capsule (2.5 mg  total) by mouth every  evening. 30 capsule 6   No current facility-administered medications for this visit.    Past Medical History  Diagnosis Date  . ASCVD (arteriosclerotic  cardiovascular disease)     a. MI 2004, did not seek care at that time. b. s/p PTCA/BMS to OM1 04/2011 (known totally occluded RCA with L-R collaterals and failed angioplasty on that vessel).  . MI (myocardial infarction)     x2  . Hyperlipidemia   . OSA (obstructive sleep apnea) 05/24/11  . Restless leg syndrome 06/25/2011  . LV dysfunction     EF 45% by cath 2012  . HTN (hypertension)   . Type 2 diabetes mellitus     Hgb A1C 7.6% 11/24/12  . Chronic bronchitis     Past Surgical History  Procedure Laterality Date  . Tonsillectomy and adenoidectomy    . Cystectomy      Spine and jaw  . Lesion removed from foot      right foot  . Coronary stent placement  04/2011  . Cardiac catheterization  11/24/2012    40-50% ostial LAD, 40% mid LAD, 60-70% ostial D1, 40% proximal LCx, patent OM branch stent, 99% serial proximal-mid RCA lesions, 100% mid RCA occlusion (chronic with left to right collateralization); EF 40%, inferior wall hypokinesis.  . Left heart catheterization with coronary angiogram N/A 11/24/2012    Procedure: LEFT HEART CATHETERIZATION WITH CORONARY ANGIOGRAM;  Surgeon: Kathleene Hazel, MD;  Location: [facility] CATH LAB;  Service: Cardiovascular;  Laterality: N/A;    ROS:  As stated in the HPI and negative for  all other systems.  PHYSICAL EXAM BP 122/76 mmHg  Pulse 58  Ht  (1.778 m)  Wt 205 lb (92.987 kg)  BMI 29.41 kg/m2 GENERAL:  Well appearing NECK:  No jugular venous distention, waveform within normal limits, carotid upstroke brisk and symmetric, no bruits, no thyromegaly LUNGS:  Clear to auscultation bilaterally BACK:  No CVA tenderness CHEST:  Unremarkable HEART:  PMI not displaced or sustained,S1 and S2 within normal limits, no S3, no S4, no clicks, no rubs, no murmurs ABD:  Flat, positive bowel sounds normal in frequency in pitch, no bruits, no rebound, no guarding, no midline pulsatile mass, no hepatomegaly, no splenomegaly EXT:  2 plus pulses throughout, no  edema, no cyanosis no clubbing   EKG:  Normal sinus rhythm, rate 58, old inferior infarct, inferior T-wave inversion,  Unchanged from previous. .  04/12/2015   ASSESSMENT AND PLAN  CAD (coronary artery disease) - He is having some neck discomfort that is somewhat atypical in his previous. However, given his past history and ongoing risk factors he needs to be screened. He has known occlusive coronary disease. I need the added sensitivity of imaging to further risk stratify. He will have an exercise Myoview.   Dyslipidemia -  He will continue the meds as listed.  He was given instructions to get a fasting lipid profile.   Sleep apnea -  He continues with CPAP.    Hypertension -  His blood pressure is at target.  He will continue the meds as listed.

## 2015-04-12 NOTE — Patient Instructions (Signed)
Your physician has requested that you have en exercise stress myoview. For further information please visit [email or web address]. Please follow instruction sheet, as given.   

## 2015-04-17 ENCOUNTER — Telehealth (HOSPITAL_COMMUNITY): Payer: Self-pay | Admitting: Radiology

## 2015-04-17 NOTE — Telephone Encounter (Signed)
Encounter complete. 

## 2015-04-18 ENCOUNTER — Telehealth (HOSPITAL_COMMUNITY): Payer: Self-pay | Admitting: Radiology

## 2015-04-18 NOTE — Telephone Encounter (Signed)
Encounter complete. 

## 2015-04-19 ENCOUNTER — Ambulatory Visit (HOSPITAL_COMMUNITY)
Admission: RE | Admit: 2015-04-19 | Discharge: 2015-04-19 | Disposition: A | Discharge status: Home / Self Care | Payer: 59 | Source: Ambulatory Visit | Attending: Cardiology | Admitting: Cardiology

## 2015-04-19 DIAGNOSIS — I10 Essential (primary) hypertension: Secondary | ICD-10-CM | POA: Diagnosis not present

## 2015-04-19 DIAGNOSIS — R9439 Abnormal result of other cardiovascular function study: Secondary | ICD-10-CM | POA: Insufficient documentation

## 2015-04-19 DIAGNOSIS — I2510 Atherosclerotic heart disease of native coronary artery without angina pectoris: Secondary | ICD-10-CM

## 2015-04-19 DIAGNOSIS — Z8249 Family history of ischemic heart disease and other diseases of the circulatory system: Secondary | ICD-10-CM | POA: Diagnosis not present

## 2015-04-19 DIAGNOSIS — F17200 Nicotine dependence, unspecified, uncomplicated: Secondary | ICD-10-CM | POA: Insufficient documentation

## 2015-04-19 DIAGNOSIS — E119 Type 2 diabetes mellitus without complications: Secondary | ICD-10-CM | POA: Insufficient documentation

## 2015-04-19 DIAGNOSIS — R072 Precordial pain: Secondary | ICD-10-CM | POA: Insufficient documentation

## 2015-04-19 LAB — MYOCARDIAL PERFUSION IMAGING
CHL CUP NUCLEAR SDS: 4
CHL CUP RESTING HR STRESS: 60 {beats}/min
CHL RATE OF PERCEIVED EXERTION: 16
CSEPED: 10 min
CSEPEW: 12.5 METS
CSEPHR: 99 %
CSEPPHR: 160 {beats}/min
Exercise duration (sec): 30 s
LV dias vol: 130 mL
LVSYSVOL: 94 mL
MPHR: 161 {beats}/min
SRS: 6
SSS: 10
TID: 0.95

## 2015-04-19 MED ORDER — TECHNETIUM TC 99M SESTAMIBI GENERIC - CARDIOLITE
10.6000 | Freq: Once | INTRAVENOUS | Status: AC | PRN
  Administered 2015-04-19: 11 via INTRAVENOUS

## 2015-04-19 MED ORDER — TECHNETIUM TC 99M SESTAMIBI GENERIC - CARDIOLITE
32.0000 | Freq: Once | INTRAVENOUS | Status: AC | PRN
  Administered 2015-04-19: 32 via INTRAVENOUS

## 2015-04-20 ENCOUNTER — Other Ambulatory Visit: Payer: Self-pay | Admitting: Cardiology

## 2015-04-20 MED ORDER — NITROGLYCERIN 0.4 MG SL SUBL: 0.4000 mg | SUBLINGUAL_TABLET | SUBLINGUAL | Status: DC | PRN

## 2015-04-24 ENCOUNTER — Telehealth: Payer: Self-pay | Admitting: *Deleted

## 2015-04-24 DIAGNOSIS — R072 Precordial pain: Secondary | ICD-10-CM

## 2015-04-24 DIAGNOSIS — I2510 Atherosclerotic heart disease of native coronary artery without angina pectoris: Secondary | ICD-10-CM

## 2015-04-24 NOTE — Telephone Encounter (Signed)
-----   Message from Rollene Rotunda, MD sent at 04/19/2015  6:01 PM EDT ----- EF is lower than previous but I need an echo to further evaluate.  He does have an evidence of his previous known obstructive RCA stenosis.  I will need to see him back in clinic after the echo to discuss his symptoms and the need for further testing.

## 2015-05-02 ENCOUNTER — Telehealth: Payer: Self-pay | Admitting: Cardiology

## 2015-05-02 ENCOUNTER — Other Ambulatory Visit: Payer: Self-pay

## 2015-05-02 ENCOUNTER — Ambulatory Visit (HOSPITAL_COMMUNITY): Payer: 59 | Attending: Cardiology

## 2015-05-02 DIAGNOSIS — I2510 Atherosclerotic heart disease of native coronary artery without angina pectoris: Secondary | ICD-10-CM | POA: Diagnosis not present

## 2015-05-02 DIAGNOSIS — I10 Essential (primary) hypertension: Secondary | ICD-10-CM | POA: Diagnosis not present

## 2015-05-02 DIAGNOSIS — I517 Cardiomegaly: Secondary | ICD-10-CM | POA: Insufficient documentation

## 2015-05-02 DIAGNOSIS — E119 Type 2 diabetes mellitus without complications: Secondary | ICD-10-CM | POA: Diagnosis not present

## 2015-05-02 DIAGNOSIS — I340 Nonrheumatic mitral (valve) insufficiency: Secondary | ICD-10-CM | POA: Insufficient documentation

## 2015-05-02 DIAGNOSIS — E785 Hyperlipidemia, unspecified: Secondary | ICD-10-CM | POA: Diagnosis not present

## 2015-05-02 DIAGNOSIS — G4733 Obstructive sleep apnea (adult) (pediatric): Secondary | ICD-10-CM | POA: Diagnosis not present

## 2015-05-02 DIAGNOSIS — R072 Precordial pain: Secondary | ICD-10-CM | POA: Diagnosis not present

## 2015-05-02 DIAGNOSIS — I38 Endocarditis, valve unspecified: Secondary | ICD-10-CM | POA: Insufficient documentation

## 2015-05-02 NOTE — Telephone Encounter (Signed)
Pt's wife called inquiring about what they would have to pay today for pt's echo.  Informed her nothing due at time of service that they would just be responsible for their coinsurance after they are billed.  Also gave CPT 93306 for pt to check with insurance what their cost will be.

## 2015-05-09 ENCOUNTER — Telehealth: Payer: Self-pay | Admitting: Cardiology

## 2015-05-09 NOTE — Telephone Encounter (Signed)
Pt would like his echo test results from 05-02-15 and still no results from his stress from 04-12-15 please.If not at [facility] leave a message for him to call back.

## 2015-05-09 NOTE — Telephone Encounter (Signed)
Spoke with pt wife letting her know that Dr Antoine Poche have not reviewed Echo because he was out of office and that i already went over the stress test result with [name], reviewed it again with her letting her know because of that result Dr Antoine Poche needed a more in dept study and that's the reason he ordered the Echo

## 2015-05-14 ENCOUNTER — Other Ambulatory Visit: Payer: Self-pay | Admitting: Cardiology

## 2015-05-21 ENCOUNTER — Telehealth: Payer: Self-pay | Admitting: Cardiology

## 2015-05-21 NOTE — Telephone Encounter (Signed)
Spoke with pt letting him/wife know that message was send to Dr Antoine Poche to reviewed Echo

## 2015-05-21 NOTE — Telephone Encounter (Signed)
Pt calling about echo post-stress test, needs to know results as well as recommendations. Routed to Dr. Antoine Poche to advise.

## 2015-05-21 NOTE — Telephone Encounter (Signed)
[name] is calling because [name] has a Stress test and a Echo and they have not heard again about the results of either tests. States that they called on 05/09/15 about getting  the results. Please call  Thanks

## 2015-05-25 ENCOUNTER — Encounter: Payer: Self-pay | Admitting: Cardiovascular Disease

## 2015-05-25 ENCOUNTER — Ambulatory Visit (INDEPENDENT_AMBULATORY_CARE_PROVIDER_SITE_OTHER): Payer: 59 | Admitting: Cardiology

## 2015-05-25 ENCOUNTER — Other Ambulatory Visit: Payer: Self-pay | Admitting: *Deleted

## 2015-05-25 ENCOUNTER — Encounter: Payer: Self-pay | Admitting: Cardiology

## 2015-05-25 VITALS — BP 130/80 | HR 64 | Ht 70.0 in | Wt 209.3 lb

## 2015-05-25 DIAGNOSIS — R9439 Abnormal result of other cardiovascular function study: Secondary | ICD-10-CM

## 2015-05-25 DIAGNOSIS — Z01818 Encounter for other preprocedural examination: Secondary | ICD-10-CM | POA: Diagnosis not present

## 2015-05-25 DIAGNOSIS — R931 Abnormal findings on diagnostic imaging of heart and coronary circulation: Secondary | ICD-10-CM | POA: Diagnosis not present

## 2015-05-25 DIAGNOSIS — D689 Coagulation defect, unspecified: Secondary | ICD-10-CM | POA: Diagnosis not present

## 2015-05-25 LAB — BASIC METABOLIC PANEL
BUN: 17 mg/dL (ref 7–25)
CALCIUM: 9.4 mg/dL (ref 8.6–10.3)
CO2: 22 mmol/L (ref 20–31)
Chloride: 105 mmol/L (ref 98–110)
Creat: 0.79 mg/dL (ref 0.70–1.25)
GLUCOSE: 113 mg/dL — AB (ref 65–99)
Potassium: 4.2 mmol/L (ref 3.5–5.3)
SODIUM: 140 mmol/L (ref 135–146)

## 2015-05-25 LAB — CBC
HCT: 44.6 % (ref 39.0–52.0)
HEMOGLOBIN: 15.6 g/dL (ref 13.0–17.0)
MCH: 31.9 pg (ref 26.0–34.0)
MCHC: 35 g/dL (ref 30.0–36.0)
MCV: 91.2 fL (ref 78.0–100.0)
MPV: 12.1 fL (ref 8.6–12.4)
Platelets: 148 10*3/uL — ABNORMAL LOW (ref 150–400)
RBC: 4.89 MIL/uL (ref 4.22–5.81)
RDW: 14.1 % (ref 11.5–15.5)
WBC: 7.2 10*3/uL (ref 4.0–10.5)

## 2015-05-25 LAB — APTT: aPTT: 31 seconds (ref 24–37)

## 2015-05-25 LAB — PROTIME-INR
INR: 0.92 (ref <1.50)
PROTHROMBIN TIME: 12.5 s (ref 11.6–15.2)

## 2015-05-25 NOTE — Patient Instructions (Signed)
SCHEDULE WITH DR Clifton James- Monday or THURS. LEFT HEART- RIGHT RADIAL- Your physician has requested that you have a cardiac catheterization. Cardiac catheterization is used to diagnose and/or treat various heart conditions. Doctors may recommend this procedure for a number of different reasons. The most common reason is to evaluate chest pain. Chest pain can be a symptom of coronary artery disease (CAD), and cardiac catheterization can show whether plaque is narrowing or blocking your heart's arteries. This procedure is also used to evaluate the valves, as well as measure the blood flow and oxygen levels in different parts of your heart. For further information please visit [email or web address] Please follow instruction sheet, as given.  DO LABS TODAY- BMP ,CBC, PT,PTT   Your physician recommends that you schedule a follow-up appointment follow appointment after cardiac cath.

## 2015-05-25 NOTE — Progress Notes (Signed)
HPI The patient presents for followup of his known coronary disease. At the last visit he was reporting  some discomfort. This was in his posterior neck and goes up behind his ears. It was sporadic. One episode was associated with some diaphoresis. He's had occasional nitroglycerin when this has happened and it seems to improve his symptoms.  I sent him for a stress perfusion study which suggested if his ejection fraction which had been 45-50% was now down to about 30%. There was a medium defect in the basal inferior needed. Location consistent with his previous known occluded right coronary artery. There was a medium defect in the mid anterior apical anterior and apical inferior location as well. An echocardiogram confirmed the EF was down to 30%. He's not had any further symptoms.  However, he's able to do activities such as pushing a lawnmower which she's done recently and this does not cause any symptoms. He does not get the same kind of jaw discomfort that he was having previously with his coronary disease. He's not getting any new shortness of breath, PND or orthopnea. He's not getting any new weight gain or edema.    Allergies  Allergen Reactions  . Lipitor [Atorvastatin Calcium]   . Niaspan [Niacin Er]     Current Outpatient Prescriptions  Medication Sig Dispense Refill  . aspirin 81 MG tablet Take 81 mg by mouth every evening.     . levalbuterol (XOPENEX HFA) 45 MCG/ACT inhaler Inhale 1-2 puffs into the lungs every 4 (four) hours as needed for wheezing. 1 Inhaler 12  . metFORMIN (GLUCOPHAGE) 1000 MG tablet Take 1,000 mg by mouth daily.    . nitroGLYCERIN (NITROSTAT) 0.4 MG SL tablet Place 1 tablet (0.4 mg total) under the tongue every 5 (five) minutes as needed for chest pain. 30 tablet 1  . pantoprazole (PROTONIX) 40 MG tablet Take 1 tablet (40 mg total) by mouth every evening. 30 tablet 0  . pramipexole (MIRAPEX) 1 MG tablet Take 1 mg by mouth 2 (two) times daily.    . ramipril  (ALTACE) 2.5 MG capsule Take 1 capsule (2.5 mg  total) by mouth every  evening. 30 capsule 6   No current facility-administered medications for this visit.    Past Medical History  Diagnosis Date  . ASCVD (arteriosclerotic cardiovascular disease)     a. MI 2004, did not seek care at that time. b. s/p PTCA/BMS to OM1 04/2011 (known totally occluded RCA with L-R collaterals and failed angioplasty on that vessel).  . MI (myocardial infarction) (HCC)     x2  . Hyperlipidemia   . OSA (obstructive sleep apnea) 05/24/11  . Restless leg syndrome 06/25/2011  . LV dysfunction     EF 45% by cath 2012  . HTN (hypertension)   . Type 2 diabetes mellitus (HCC)   . Chronic bronchitis [facility])     Past Surgical History  Procedure Laterality Date  . Tonsillectomy and adenoidectomy    . Cystectomy      Spine and jaw  . Lesion removed from foot      right foot  . Coronary stent placement  04/2011  . Cardiac catheterization  11/24/2012    40-50% ostial LAD, 40% mid LAD, 60-70% ostial D1, 40% proximal LCx, patent OM branch stent, 99% serial proximal-mid RCA lesions, 100% mid RCA occlusion (chronic with left to right collateralization); EF 40%, inferior wall hypokinesis.  . Left heart catheterization with coronary angiogram N/A 11/24/2012    Procedure: LEFT HEART  CATHETERIZATION WITH CORONARY ANGIOGRAM;  Surgeon: Kathleene Hazel, MD;  Location: [facility] CATH LAB;  Service: Cardiovascular;  Laterality: N/A;    ROS:  As stated in the HPI and negative for all other systems.  PHYSICAL EXAM BP 130/80 mmHg  Pulse 64  Ht 5\' 10"  (1.778 m)  Wt 209 lb 4.8 oz (94.938 kg)  BMI 30.03 kg/m2 GENERAL:  Well appearing HEENT:  PERRL, fundi visualized.   NECK:  No jugular venous distention, waveform within normal limits, carotid upstroke brisk and symmetric, no bruits, no thyromegaly LUNGS:  Clear to auscultation bilaterally BACK:  No CVA tenderness CHEST:  Unremarkable HEART:  PMI not displaced or sustained,S1 and  S2 within normal limits, no S3, no S4, no clicks, no rubs, no murmurs ABD:  Flat, positive bowel sounds normal in frequency in pitch, no bruits, no rebound, no guarding, no midline pulsatile mass, no hepatomegaly, no splenomegaly EXT:  2 plus pulses throughout, no edema, no cyanosis no clubbing NEURO:  Nonfocal   ASSESSMENT AND PLAN  CAD (coronary artery disease) - Given his high risk findings on Lexiscan Myoview and the EF lower than previous, cath is indicated.  The patient understands that risks included but are not limited to stroke (1 in 1000), death (1 in 1000), kidney failure [usually temporary] (1 in 500), bleeding (1 in 200), allergic reaction [possibly serious] (1 in 200).  The patient understands and agrees to proceed.   Dyslipidemia -  He will continue the meds as listed.  He was given instructions to get a fasting lipid profile.   Sleep apnea -  He continues with CPAP.    Hypertension -  His blood pressure is at target.  He will continue the meds as listed.

## 2015-05-27 ENCOUNTER — Encounter: Payer: Self-pay | Admitting: Cardiology

## 2015-05-31 ENCOUNTER — Ambulatory Visit (HOSPITAL_COMMUNITY)
Admission: RE | Admit: 2015-05-31 | Discharge: 2015-05-31 | Disposition: A | Discharge status: Home / Self Care | Payer: 59 | Source: Ambulatory Visit | Attending: Cardiovascular Disease | Admitting: Cardiovascular Disease

## 2015-05-31 ENCOUNTER — Encounter (HOSPITAL_COMMUNITY): Payer: Self-pay | Admitting: Cardiovascular Disease

## 2015-05-31 ENCOUNTER — Encounter (HOSPITAL_COMMUNITY)
Admission: RE | Disposition: A | Discharge status: Home / Self Care | Payer: Self-pay | Source: Ambulatory Visit | Attending: Cardiovascular Disease

## 2015-05-31 DIAGNOSIS — Z7984 Long term (current) use of oral hypoglycemic drugs: Secondary | ICD-10-CM | POA: Insufficient documentation

## 2015-05-31 DIAGNOSIS — I10 Essential (primary) hypertension: Secondary | ICD-10-CM | POA: Insufficient documentation

## 2015-05-31 DIAGNOSIS — Z7982 Long term (current) use of aspirin: Secondary | ICD-10-CM | POA: Insufficient documentation

## 2015-05-31 DIAGNOSIS — I252 Old myocardial infarction: Secondary | ICD-10-CM | POA: Diagnosis not present

## 2015-05-31 DIAGNOSIS — I2510 Atherosclerotic heart disease of native coronary artery without angina pectoris: Secondary | ICD-10-CM | POA: Diagnosis not present

## 2015-05-31 DIAGNOSIS — I2582 Chronic total occlusion of coronary artery: Secondary | ICD-10-CM | POA: Diagnosis not present

## 2015-05-31 DIAGNOSIS — G2581 Restless legs syndrome: Secondary | ICD-10-CM | POA: Diagnosis not present

## 2015-05-31 DIAGNOSIS — J42 Unspecified chronic bronchitis: Secondary | ICD-10-CM | POA: Insufficient documentation

## 2015-05-31 DIAGNOSIS — I255 Ischemic cardiomyopathy: Secondary | ICD-10-CM | POA: Insufficient documentation

## 2015-05-31 DIAGNOSIS — G4733 Obstructive sleep apnea (adult) (pediatric): Secondary | ICD-10-CM | POA: Insufficient documentation

## 2015-05-31 DIAGNOSIS — Z955 Presence of coronary angioplasty implant and graft: Secondary | ICD-10-CM | POA: Insufficient documentation

## 2015-05-31 DIAGNOSIS — E119 Type 2 diabetes mellitus without complications: Secondary | ICD-10-CM | POA: Insufficient documentation

## 2015-05-31 DIAGNOSIS — E785 Hyperlipidemia, unspecified: Secondary | ICD-10-CM | POA: Insufficient documentation

## 2015-05-31 DIAGNOSIS — Z01818 Encounter for other preprocedural examination: Secondary | ICD-10-CM

## 2015-05-31 HISTORY — PX: CARDIAC CATHETERIZATION: SHX172

## 2015-05-31 LAB — GLUCOSE, CAPILLARY: Glucose-Capillary: 152 mg/dL — ABNORMAL HIGH (ref 65–99)

## 2015-05-31 SURGERY — LEFT HEART CATH AND CORONARY ANGIOGRAPHY
Anesthesia: LOCAL

## 2015-05-31 MED ORDER — SODIUM CHLORIDE 0.9 % IV SOLN: 250.0000 mL | INTRAVENOUS | Status: DC | PRN

## 2015-05-31 MED ORDER — HEPARIN SODIUM (PORCINE) 1000 UNIT/ML IJ SOLN
INTRAMUSCULAR | Status: AC
  Filled 2015-05-31: qty 1

## 2015-05-31 MED ORDER — NITROGLYCERIN 1 MG/10 ML FOR IR/CATH LAB
INTRA_ARTERIAL | Status: AC
  Filled 2015-05-31: qty 10

## 2015-05-31 MED ORDER — HEPARIN (PORCINE) IN NACL 2-0.9 UNIT/ML-% IJ SOLN
INTRAMUSCULAR | Status: DC | PRN
  Administered 2015-05-31: 08:00:00 via INTRA_ARTERIAL

## 2015-05-31 MED ORDER — HEPARIN SODIUM (PORCINE) 1000 UNIT/ML IJ SOLN
INTRAMUSCULAR | Status: DC | PRN
  Administered 2015-05-31: 5000 [IU] via INTRAVENOUS

## 2015-05-31 MED ORDER — SODIUM CHLORIDE 0.9 % IJ SOLN: 3.0000 mL | INTRAMUSCULAR | Status: DC | PRN

## 2015-05-31 MED ORDER — SODIUM CHLORIDE 0.9 % IV SOLN: INTRAVENOUS | Status: AC

## 2015-05-31 MED ORDER — FENTANYL CITRATE (PF) 100 MCG/2ML IJ SOLN
INTRAMUSCULAR | Status: AC
  Filled 2015-05-31: qty 4

## 2015-05-31 MED ORDER — SODIUM CHLORIDE 0.9 % IJ SOLN: 3.0000 mL | Freq: Two times a day (BID) | INTRAMUSCULAR | Status: DC

## 2015-05-31 MED ORDER — MIDAZOLAM HCL 2 MG/2ML IJ SOLN
INTRAMUSCULAR | Status: DC | PRN
Start: 2015-05-31 — End: 2015-05-31
  Administered 2015-05-31: 2 mg via INTRAVENOUS

## 2015-05-31 MED ORDER — ASPIRIN 81 MG PO CHEW
81.0000 mg | CHEWABLE_TABLET | ORAL | Status: AC
  Administered 2015-05-31: 81 mg via ORAL

## 2015-05-31 MED ORDER — LIDOCAINE HCL (PF) 1 % IJ SOLN
INTRAMUSCULAR | Status: AC
  Filled 2015-05-31: qty 30

## 2015-05-31 MED ORDER — VERAPAMIL HCL 2.5 MG/ML IV SOLN
INTRAVENOUS | Status: AC
  Filled 2015-05-31: qty 2

## 2015-05-31 MED ORDER — HEPARIN (PORCINE) IN NACL 2-0.9 UNIT/ML-% IJ SOLN
INTRAMUSCULAR | Status: AC
  Filled 2015-05-31: qty 1500

## 2015-05-31 MED ORDER — FENTANYL CITRATE (PF) 100 MCG/2ML IJ SOLN
INTRAMUSCULAR | Status: DC | PRN
  Administered 2015-05-31: 50 ug via INTRAVENOUS

## 2015-05-31 MED ORDER — MIDAZOLAM HCL 2 MG/2ML IJ SOLN
INTRAMUSCULAR | Status: AC
  Filled 2015-05-31: qty 4

## 2015-05-31 MED ORDER — ASPIRIN 81 MG PO CHEW
CHEWABLE_TABLET | ORAL | Status: AC
  Administered 2015-05-31: 81 mg via ORAL
  Filled 2015-05-31: qty 1

## 2015-05-31 MED ORDER — SODIUM CHLORIDE 0.9 % IV SOLN
INTRAVENOUS | Status: DC
  Administered 2015-05-31: 06:00:00 via INTRAVENOUS

## 2015-05-31 SURGICAL SUPPLY — 10 items
CATH INFINITI 5 FR JL3.5 (CATHETERS) ×2 IMPLANT
CATH INFINITI 5FR ANG PIGTAIL (CATHETERS) ×2 IMPLANT
CATH INFINITI JR4 5F (CATHETERS) ×2 IMPLANT
DEVICE RAD COMP TR BAND LRG (VASCULAR PRODUCTS) ×2 IMPLANT
GLIDESHEATH SLEND SS 6F .021 (SHEATH) ×2 IMPLANT
KIT HEART LEFT (KITS) ×2 IMPLANT
PACK CARDIAC CATHETERIZATION (CUSTOM PROCEDURE TRAY) ×2 IMPLANT
TRANSDUCER W/STOPCOCK (MISCELLANEOUS) ×2 IMPLANT
TUBING CIL FLEX 10 FLL-RA (TUBING) ×2 IMPLANT
WIRE SAFE-T 1.5MM-J .035X260CM (WIRE) ×2 IMPLANT

## 2015-05-31 NOTE — Interval H&P Note (Signed)
History and Physical Interval Note:  05/31/2015 7:25 AM  [name]  has presented today for cardiac cath with the diagnosis of CAD, cardiomyopathy with recent worsening of LV function. Cath to exclude progression of CAD. The various methods of treatment have been discussed with the patient and family. After consideration of risks, benefits and other options for treatment, the patient has consented to  Procedure(s): Left Heart Cath and Coronary Angiography (N/A) as a surgical intervention .  The patient's history has been reviewed, patient examined, no change in status, stable for surgery.  I have reviewed the patient's chart and labs.  Questions were answered to the patient's satisfaction.    Cath Lab Visit (complete for each Cath Lab visit)  Clinical Evaluation Leading to the Procedure:   ACS: No.  Non-ACS:    Anginal Classification: CCS I  Anti-ischemic medical therapy: No Therapy  Non-Invasive Test Results: High-risk stress test findings: cardiac mortality >3%/year  Prior CABG: No previous CABG        [name]

## 2015-05-31 NOTE — Discharge Instructions (Signed)
Radial Site Care °Refer to this sheet in the next few weeks. These instructions provide you with information about caring for yourself after your procedure. Your health care provider may also give you more specific instructions. Your treatment has been planned according to current medical practices, but problems sometimes occur. Call your health care provider if you have any problems or questions after your procedure. °WHAT TO EXPECT AFTER THE PROCEDURE °After your procedure, it is typical to have the following: °· Bruising at the radial site that usually fades within 1-2 weeks. °· Blood collecting in the tissue (hematoma) that may be painful to the touch. It should usually decrease in size and tenderness within 1-2 weeks. °HOME CARE INSTRUCTIONS °· Take medicines only as directed by your health care provider. °· You may shower 24-48 hours after the procedure or as directed by your health care provider. Remove the bandage (dressing) and gently wash the site with plain soap and water. Pat the area dry with a clean towel. Do not rub the site, because this may cause bleeding. °· Do not take baths, swim, or use a hot tub until your health care provider approves. °· Check your insertion site every day for redness, swelling, or drainage. °· Do not apply powder or lotion to the site. °· Do not flex or bend the affected arm for 24 hours or as directed by your health care provider. °· Do not push or pull heavy objects with the affected arm for 24 hours or as directed by your health care provider. °· Do not lift over 10 lb (4.5 kg) for 5 days after your procedure or as directed by your health care provider. °· Ask your health care provider when it is okay to: °¨ Return to work or school. °¨ Resume usual physical activities or sports. °¨ Resume sexual activity. °· Do not drive home if you are discharged the same day as the procedure. Have someone else drive you. °· You may drive 24 hours after the procedure unless otherwise  instructed by your health care provider. °· Do not operate machinery or power tools for 24 hours after the procedure. °· If your procedure was done as an outpatient procedure, which means that you went home the same day as your procedure, a responsible adult should be with you for the first 24 hours after you arrive home. °· Keep all follow-up visits as directed by your health care provider. This is important. °SEEK MEDICAL CARE IF: °· You have a fever. °· You have chills. °· You have increased bleeding from the radial site. Hold pressure on the site and call 911. °SEEK IMMEDIATE MEDICAL CARE IF: °· You have unusual pain at the radial site. °· You have redness, warmth, or swelling at the radial site. °· You have drainage (other than a small amount of blood on the dressing) from the radial site. °· The radial site is bleeding, and the bleeding does not stop after 30 minutes of holding steady pressure on the site. °· Your arm or hand becomes pale, cool, tingly, or numb. °  °This information is not intended to replace advice given to you by your health care provider. Make sure you discuss any questions you have with your health care provider. °  °Document Released: 09/06/2010 Document Revised: 08/25/2014 Document Reviewed: 02/20/2014 °Elsevier Interactive Patient Education ©2016 [employer] ° °

## 2015-05-31 NOTE — H&P (View-Only) (Signed)
HPI The patient presents for followup of his known coronary disease. At the last visit he was reporting  some discomfort. This was in his posterior neck and goes up behind his ears. It was sporadic. One episode was associated with some diaphoresis. He's had occasional nitroglycerin when this has happened and it seems to improve his symptoms.  I sent him for a stress perfusion study which suggested if his ejection fraction which had been 45-50% was now down to about 30%. There was a medium defect in the basal inferior needed. Location consistent with his previous known occluded right coronary artery. There was a medium defect in the mid anterior apical anterior and apical inferior location as well. An echocardiogram confirmed the EF was down to 30%. He's not had any further symptoms.  However, he's able to do activities such as pushing a lawnmower which she's done recently and this does not cause any symptoms. He does not get the same kind of jaw discomfort that he was having previously with his coronary disease. He's not getting any new shortness of breath, PND or orthopnea. He's not getting any new weight gain or edema.    Allergies  Allergen Reactions  . Lipitor [Atorvastatin Calcium]   . Niaspan [Niacin Er]     Current Outpatient Prescriptions  Medication Sig Dispense Refill  . aspirin 81 MG tablet Take 81 mg by mouth every evening.     . levalbuterol (XOPENEX HFA) 45 MCG/ACT inhaler Inhale 1-2 puffs into the lungs every 4 (four) hours as needed for wheezing. 1 Inhaler 12  . metFORMIN (GLUCOPHAGE) 1000 MG tablet Take 1,000 mg by mouth daily.    . nitroGLYCERIN (NITROSTAT) 0.4 MG SL tablet Place 1 tablet (0.4 mg total) under the tongue every 5 (five) minutes as needed for chest pain. 30 tablet 1  . pantoprazole (PROTONIX) 40 MG tablet Take 1 tablet (40 mg total) by mouth every evening. 30 tablet 0  . pramipexole (MIRAPEX) 1 MG tablet Take 1 mg by mouth 2 (two) times daily.    . ramipril  (ALTACE) 2.5 MG capsule Take 1 capsule (2.5 mg  total) by mouth every  evening. 30 capsule 6   No current facility-administered medications for this visit.    Past Medical History  Diagnosis Date  . ASCVD (arteriosclerotic cardiovascular disease)     a. MI 2004, did not seek care at that time. b. s/p PTCA/BMS to OM1 04/2011 (known totally occluded RCA with L-R collaterals and failed angioplasty on that vessel).  . MI (myocardial infarction) (HCC)     x2  . Hyperlipidemia   . OSA (obstructive sleep apnea) 05/24/11  . Restless leg syndrome 06/25/2011  . LV dysfunction     EF 45% by cath 2012  . HTN (hypertension)   . Type 2 diabetes mellitus (HCC)   . Chronic bronchitis [facility])     Past Surgical History  Procedure Laterality Date  . Tonsillectomy and adenoidectomy    . Cystectomy      Spine and jaw  . Lesion removed from foot      right foot  . Coronary stent placement  04/2011  . Cardiac catheterization  11/24/2012    40-50% ostial LAD, 40% mid LAD, 60-70% ostial D1, 40% proximal LCx, patent OM branch stent, 99% serial proximal-mid RCA lesions, 100% mid RCA occlusion (chronic with left to right collateralization); EF 40%, inferior wall hypokinesis.  . Left heart catheterization with coronary angiogram N/A 11/24/2012    Procedure: LEFT HEART  CATHETERIZATION WITH CORONARY ANGIOGRAM;  Surgeon: Kathleene Hazel, MD;  Location: [facility] CATH LAB;  Service: Cardiovascular;  Laterality: N/A;    ROS:  As stated in the HPI and negative for all other systems.  PHYSICAL EXAM BP 130/80 mmHg  Pulse 64  Ht 5\' 10"  (1.778 m)  Wt 209 lb 4.8 oz (94.938 kg)  BMI 30.03 kg/m2 GENERAL:  Well appearing HEENT:  PERRL, fundi visualized.   NECK:  No jugular venous distention, waveform within normal limits, carotid upstroke brisk and symmetric, no bruits, no thyromegaly LUNGS:  Clear to auscultation bilaterally BACK:  No CVA tenderness CHEST:  Unremarkable HEART:  PMI not displaced or sustained,S1 and  S2 within normal limits, no S3, no S4, no clicks, no rubs, no murmurs ABD:  Flat, positive bowel sounds normal in frequency in pitch, no bruits, no rebound, no guarding, no midline pulsatile mass, no hepatomegaly, no splenomegaly EXT:  2 plus pulses throughout, no edema, no cyanosis no clubbing NEURO:  Nonfocal   ASSESSMENT AND PLAN  CAD (coronary artery disease) - Given his high risk findings on Lexiscan Myoview and the EF lower than previous, cath is indicated.  The patient understands that risks included but are not limited to stroke (1 in 1000), death (1 in 1000), kidney failure [usually temporary] (1 in 500), bleeding (1 in 200), allergic reaction [possibly serious] (1 in 200).  The patient understands and agrees to proceed.   Dyslipidemia -  He will continue the meds as listed.  He was given instructions to get a fasting lipid profile.   Sleep apnea -  He continues with CPAP.    Hypertension -  His blood pressure is at target.  He will continue the meds as listed.

## 2015-06-01 MED FILL — Lidocaine HCl Local Preservative Free (PF) Inj 1%: INTRAMUSCULAR | Qty: 30 | Status: AC

## 2016-09-25 DIAGNOSIS — E119 Type 2 diabetes mellitus without complications: Secondary | ICD-10-CM | POA: Diagnosis not present

## 2016-09-25 DIAGNOSIS — E785 Hyperlipidemia, unspecified: Secondary | ICD-10-CM | POA: Diagnosis not present

## 2016-09-25 DIAGNOSIS — I10 Essential (primary) hypertension: Secondary | ICD-10-CM | POA: Diagnosis not present

## 2017-01-01 DIAGNOSIS — K219 Gastro-esophageal reflux disease without esophagitis: Secondary | ICD-10-CM | POA: Diagnosis not present

## 2017-01-01 DIAGNOSIS — I10 Essential (primary) hypertension: Secondary | ICD-10-CM | POA: Diagnosis not present

## 2017-01-01 DIAGNOSIS — I252 Old myocardial infarction: Secondary | ICD-10-CM | POA: Diagnosis not present

## 2017-01-01 DIAGNOSIS — E119 Type 2 diabetes mellitus without complications: Secondary | ICD-10-CM | POA: Diagnosis not present

## 2017-01-01 DIAGNOSIS — E785 Hyperlipidemia, unspecified: Secondary | ICD-10-CM | POA: Diagnosis not present

## 2017-01-01 DIAGNOSIS — I2510 Atherosclerotic heart disease of native coronary artery without angina pectoris: Secondary | ICD-10-CM | POA: Diagnosis not present

## 2017-08-20 DIAGNOSIS — J019 Acute sinusitis, unspecified: Secondary | ICD-10-CM | POA: Diagnosis not present

## 2017-10-13 DIAGNOSIS — R1084 Generalized abdominal pain: Secondary | ICD-10-CM | POA: Diagnosis not present

## 2017-10-15 DIAGNOSIS — R197 Diarrhea, unspecified: Secondary | ICD-10-CM | POA: Diagnosis not present

## 2017-10-15 DIAGNOSIS — A0472 Enterocolitis due to Clostridium difficile, not specified as recurrent: Secondary | ICD-10-CM | POA: Diagnosis not present

## 2017-10-21 DIAGNOSIS — I10 Essential (primary) hypertension: Secondary | ICD-10-CM | POA: Diagnosis not present

## 2017-10-21 DIAGNOSIS — I2510 Atherosclerotic heart disease of native coronary artery without angina pectoris: Secondary | ICD-10-CM | POA: Diagnosis not present

## 2017-10-21 DIAGNOSIS — E119 Type 2 diabetes mellitus without complications: Secondary | ICD-10-CM | POA: Diagnosis not present

## 2017-10-21 DIAGNOSIS — E785 Hyperlipidemia, unspecified: Secondary | ICD-10-CM | POA: Diagnosis not present

## 2017-12-24 ENCOUNTER — Inpatient Hospital Stay (HOSPITAL_COMMUNITY): Payer: 59

## 2017-12-24 ENCOUNTER — Inpatient Hospital Stay (HOSPITAL_COMMUNITY)
Admission: EM | Admit: 2017-12-24 | Discharge: 2017-12-26 | DRG: 246 | Disposition: A | Discharge status: Home / Self Care | Payer: 59 | Attending: Cardiology | Admitting: Cardiology

## 2017-12-24 ENCOUNTER — Emergency Department (HOSPITAL_COMMUNITY): Payer: 59

## 2017-12-24 ENCOUNTER — Encounter (HOSPITAL_COMMUNITY): Payer: Self-pay | Admitting: Emergency Medicine

## 2017-12-24 ENCOUNTER — Other Ambulatory Visit: Payer: Self-pay

## 2017-12-24 DIAGNOSIS — Z888 Allergy status to other drugs, medicaments and biological substances status: Secondary | ICD-10-CM | POA: Diagnosis not present

## 2017-12-24 DIAGNOSIS — E785 Hyperlipidemia, unspecified: Secondary | ICD-10-CM | POA: Diagnosis present

## 2017-12-24 DIAGNOSIS — I2582 Chronic total occlusion of coronary artery: Secondary | ICD-10-CM | POA: Diagnosis present

## 2017-12-24 DIAGNOSIS — I25110 Atherosclerotic heart disease of native coronary artery with unstable angina pectoris: Secondary | ICD-10-CM | POA: Diagnosis not present

## 2017-12-24 DIAGNOSIS — Z7982 Long term (current) use of aspirin: Secondary | ICD-10-CM

## 2017-12-24 DIAGNOSIS — I252 Old myocardial infarction: Secondary | ICD-10-CM

## 2017-12-24 DIAGNOSIS — G4733 Obstructive sleep apnea (adult) (pediatric): Secondary | ICD-10-CM | POA: Diagnosis present

## 2017-12-24 DIAGNOSIS — D72829 Elevated white blood cell count, unspecified: Secondary | ICD-10-CM | POA: Diagnosis present

## 2017-12-24 DIAGNOSIS — I110 Hypertensive heart disease with heart failure: Secondary | ICD-10-CM | POA: Diagnosis present

## 2017-12-24 DIAGNOSIS — R079 Chest pain, unspecified: Secondary | ICD-10-CM | POA: Diagnosis not present

## 2017-12-24 DIAGNOSIS — R5383 Other fatigue: Secondary | ICD-10-CM | POA: Diagnosis not present

## 2017-12-24 DIAGNOSIS — Z8249 Family history of ischemic heart disease and other diseases of the circulatory system: Secondary | ICD-10-CM | POA: Diagnosis not present

## 2017-12-24 DIAGNOSIS — E1142 Type 2 diabetes mellitus with diabetic polyneuropathy: Secondary | ICD-10-CM | POA: Diagnosis present

## 2017-12-24 DIAGNOSIS — Z6831 Body mass index (BMI) 31.0-31.9, adult: Secondary | ICD-10-CM | POA: Diagnosis not present

## 2017-12-24 DIAGNOSIS — Z955 Presence of coronary angioplasty implant and graft: Secondary | ICD-10-CM

## 2017-12-24 DIAGNOSIS — I255 Ischemic cardiomyopathy: Secondary | ICD-10-CM | POA: Diagnosis present

## 2017-12-24 DIAGNOSIS — I10 Essential (primary) hypertension: Secondary | ICD-10-CM | POA: Diagnosis present

## 2017-12-24 DIAGNOSIS — E669 Obesity, unspecified: Secondary | ICD-10-CM | POA: Diagnosis present

## 2017-12-24 DIAGNOSIS — Z79899 Other long term (current) drug therapy: Secondary | ICD-10-CM | POA: Diagnosis not present

## 2017-12-24 DIAGNOSIS — G2581 Restless legs syndrome: Secondary | ICD-10-CM | POA: Diagnosis not present

## 2017-12-24 DIAGNOSIS — Z87891 Personal history of nicotine dependence: Secondary | ICD-10-CM | POA: Diagnosis not present

## 2017-12-24 DIAGNOSIS — I200 Unstable angina: Secondary | ICD-10-CM | POA: Diagnosis not present

## 2017-12-24 DIAGNOSIS — J42 Unspecified chronic bronchitis: Secondary | ICD-10-CM | POA: Diagnosis present

## 2017-12-24 DIAGNOSIS — I2510 Atherosclerotic heart disease of native coronary artery without angina pectoris: Secondary | ICD-10-CM | POA: Diagnosis present

## 2017-12-24 DIAGNOSIS — I5023 Acute on chronic systolic (congestive) heart failure: Secondary | ICD-10-CM | POA: Diagnosis not present

## 2017-12-24 DIAGNOSIS — Z9861 Coronary angioplasty status: Secondary | ICD-10-CM

## 2017-12-24 DIAGNOSIS — R0789 Other chest pain: Secondary | ICD-10-CM | POA: Diagnosis not present

## 2017-12-24 DIAGNOSIS — R0602 Shortness of breath: Secondary | ICD-10-CM | POA: Diagnosis not present

## 2017-12-24 LAB — ECHOCARDIOGRAM COMPLETE
HEIGHTINCHES: 69 in
Weight: 3360 oz

## 2017-12-24 LAB — URINALYSIS, ROUTINE W REFLEX MICROSCOPIC
BACTERIA UA: NONE SEEN
BILIRUBIN URINE: NEGATIVE
Glucose, UA: >=500 mg/dL — AB
Hgb urine dipstick: NEGATIVE
KETONES UR: NEGATIVE mg/dL
Leukocytes, UA: NEGATIVE
NITRITE: NEGATIVE
PH: 5 (ref 5.0–8.0)
Protein, ur: NEGATIVE mg/dL
SPECIFIC GRAVITY, URINE: 1.011 (ref 1.005–1.030)

## 2017-12-24 LAB — HEPATIC FUNCTION PANEL
ALBUMIN: 3.8 g/dL (ref 3.5–5.0)
ALT: 46 U/L (ref 17–63)
AST: 32 U/L (ref 15–41)
Alkaline Phosphatase: 75 U/L (ref 38–126)
Bilirubin, Direct: 0.1 mg/dL (ref 0.1–0.5)
Indirect Bilirubin: 0.8 mg/dL (ref 0.3–0.9)
Total Bilirubin: 0.9 mg/dL (ref 0.3–1.2)
Total Protein: 7.2 g/dL (ref 6.5–8.1)

## 2017-12-24 LAB — CBC
HCT: 52.9 % — ABNORMAL HIGH (ref 39.0–52.0)
Hemoglobin: 18 g/dL — ABNORMAL HIGH (ref 13.0–17.0)
MCH: 31.6 pg (ref 26.0–34.0)
MCHC: 34 g/dL (ref 30.0–36.0)
MCV: 92.8 fL (ref 78.0–100.0)
PLATELETS: 134 10*3/uL — AB (ref 150–400)
RBC: 5.7 MIL/uL (ref 4.22–5.81)
RDW: 14.1 % (ref 11.5–15.5)
WBC: 14.6 10*3/uL — AB (ref 4.0–10.5)

## 2017-12-24 LAB — HEMOGLOBIN A1C
HEMOGLOBIN A1C: 7.5 % — AB (ref 4.8–5.6)
MEAN PLASMA GLUCOSE: 168.55 mg/dL

## 2017-12-24 LAB — BASIC METABOLIC PANEL
Anion gap: 12 (ref 5–15)
BUN: 20 mg/dL (ref 6–20)
CHLORIDE: 105 mmol/L (ref 101–111)
CO2: 20 mmol/L — ABNORMAL LOW (ref 22–32)
CREATININE: 1 mg/dL (ref 0.61–1.24)
Calcium: 9.4 mg/dL (ref 8.9–10.3)
GFR calc Af Amer: >60 mL/min (ref >60)
Glucose, Bld: 171 mg/dL — ABNORMAL HIGH (ref 65–99)
Potassium: 4.3 mmol/L (ref 3.5–5.1)
SODIUM: 137 mmol/L (ref 135–145)

## 2017-12-24 LAB — CBG MONITORING, ED
GLUCOSE-CAPILLARY: 157 mg/dL — AB (ref 65–99)
Glucose-Capillary: 141 mg/dL — ABNORMAL HIGH (ref 65–99)

## 2017-12-24 LAB — BRAIN NATRIURETIC PEPTIDE
B Natriuretic Peptide: 250.2 pg/mL — ABNORMAL HIGH (ref 0.0–100.0)
B Natriuretic Peptide: 26.5 pg/mL (ref 0.0–100.0)

## 2017-12-24 LAB — MRSA PCR SCREENING: MRSA by PCR: NEGATIVE

## 2017-12-24 LAB — HEPARIN LEVEL (UNFRACTIONATED): HEPARIN UNFRACTIONATED: 0.3 [IU]/mL (ref 0.30–0.70)

## 2017-12-24 LAB — T4, FREE: Free T4: 0.82 ng/dL (ref 0.82–1.77)

## 2017-12-24 LAB — TROPONIN I: Troponin I: <0.03 ng/mL (ref <0.03)

## 2017-12-24 LAB — I-STAT TROPONIN, ED: Troponin i, poc: 0.02 ng/mL (ref 0.00–0.08)

## 2017-12-24 LAB — GLUCOSE, CAPILLARY: GLUCOSE-CAPILLARY: 157 mg/dL — AB (ref 65–99)

## 2017-12-24 LAB — MAGNESIUM: MAGNESIUM: 1.9 mg/dL (ref 1.7–2.4)

## 2017-12-24 LAB — TSH: TSH: 1.956 u[IU]/mL (ref 0.350–4.500)

## 2017-12-24 MED ORDER — INSULIN ASPART 100 UNIT/ML ~~LOC~~ SOLN: 0.0000 [IU] | Freq: Every day | SUBCUTANEOUS | Status: DC

## 2017-12-24 MED ORDER — MAGNESIUM GLUCONATE 500 MG PO TABS
1000.0000 mg | ORAL_TABLET | Freq: Every day | ORAL | Status: DC
  Administered 2017-12-25 – 2017-12-26 (×2): 1000 mg via ORAL
  Filled 2017-12-24 (×3): qty 2

## 2017-12-24 MED ORDER — NITROGLYCERIN 2 % TD OINT
1.0000 [in_us] | TOPICAL_OINTMENT | Freq: Four times a day (QID) | TRANSDERMAL | Status: DC
  Administered 2017-12-24 – 2017-12-25 (×3): 1 [in_us] via TOPICAL
  Filled 2017-12-24: qty 30
  Filled 2017-12-24: qty 1

## 2017-12-24 MED ORDER — ACETAMINOPHEN 325 MG PO TABS
650.0000 mg | ORAL_TABLET | ORAL | Status: DC | PRN
  Administered 2017-12-24 – 2017-12-25 (×2): 650 mg via ORAL
  Filled 2017-12-24 (×2): qty 2

## 2017-12-24 MED ORDER — RAMIPRIL 2.5 MG PO CAPS
2.5000 mg | ORAL_CAPSULE | Freq: Every day | ORAL | Status: DC
  Filled 2017-12-24 (×2): qty 1

## 2017-12-24 MED ORDER — ONDANSETRON HCL 4 MG/2ML IJ SOLN
4.0000 mg | Freq: Once | INTRAMUSCULAR | Status: AC
  Administered 2017-12-24: 4 mg via INTRAVENOUS
  Filled 2017-12-24: qty 2

## 2017-12-24 MED ORDER — PRAMIPEXOLE DIHYDROCHLORIDE 0.25 MG PO TABS
0.5000 mg | ORAL_TABLET | Freq: Four times a day (QID) | ORAL | Status: DC
  Administered 2017-12-24 – 2017-12-26 (×7): 0.5 mg via ORAL
  Filled 2017-12-24 (×11): qty 2

## 2017-12-24 MED ORDER — SODIUM CHLORIDE 0.9% FLUSH
3.0000 mL | Freq: Two times a day (BID) | INTRAVENOUS | Status: DC
  Administered 2017-12-24: 3 mL via INTRAVENOUS

## 2017-12-24 MED ORDER — ASPIRIN 81 MG PO TABS: 81.0000 mg | ORAL_TABLET | Freq: Every evening | ORAL | Status: DC

## 2017-12-24 MED ORDER — SODIUM CHLORIDE 0.9 % IV BOLUS
500.0000 mL | Freq: Once | INTRAVENOUS | Status: AC
  Administered 2017-12-24: 500 mL via INTRAVENOUS

## 2017-12-24 MED ORDER — ALPRAZOLAM 0.25 MG PO TABS: 0.2500 mg | ORAL_TABLET | Freq: Two times a day (BID) | ORAL | Status: DC | PRN

## 2017-12-24 MED ORDER — PRAMIPEXOLE DIHYDROCHLORIDE 0.25 MG PO TABS
0.5000 mg | ORAL_TABLET | Freq: Four times a day (QID) | ORAL | Status: DC
  Filled 2017-12-24 (×3): qty 2

## 2017-12-24 MED ORDER — PANTOPRAZOLE SODIUM 40 MG PO TBEC
40.0000 mg | DELAYED_RELEASE_TABLET | Freq: Every evening | ORAL | Status: DC
  Administered 2017-12-24 – 2017-12-25 (×2): 40 mg via ORAL
  Filled 2017-12-24 (×2): qty 1

## 2017-12-24 MED ORDER — SODIUM CHLORIDE 0.9 % WEIGHT BASED INFUSION
3.0000 mL/kg/h | INTRAVENOUS | Status: DC
  Administered 2017-12-25: 3 mL/kg/h via INTRAVENOUS

## 2017-12-24 MED ORDER — ACETAMINOPHEN 325 MG PO TABS
650.0000 mg | ORAL_TABLET | Freq: Once | ORAL | Status: AC
  Administered 2017-12-24: 650 mg via ORAL
  Filled 2017-12-24: qty 2

## 2017-12-24 MED ORDER — LEVALBUTEROL TARTRATE 45 MCG/ACT IN AERO: 1.0000 | INHALATION_SPRAY | RESPIRATORY_TRACT | Status: DC | PRN

## 2017-12-24 MED ORDER — INSULIN ASPART 100 UNIT/ML ~~LOC~~ SOLN
0.0000 [IU] | Freq: Three times a day (TID) | SUBCUTANEOUS | Status: DC
  Administered 2017-12-24: 1 [IU] via SUBCUTANEOUS
  Administered 2017-12-24: 2 [IU] via SUBCUTANEOUS
  Administered 2017-12-26: 1 [IU] via SUBCUTANEOUS
  Filled 2017-12-24 (×2): qty 1

## 2017-12-24 MED ORDER — ZOLPIDEM TARTRATE 5 MG PO TABS: 5.0000 mg | ORAL_TABLET | Freq: Every evening | ORAL | Status: DC | PRN

## 2017-12-24 MED ORDER — SODIUM CHLORIDE 0.9 % IV SOLN: 250.0000 mL | INTRAVENOUS | Status: DC | PRN

## 2017-12-24 MED ORDER — ONDANSETRON HCL 4 MG/2ML IJ SOLN
4.0000 mg | Freq: Four times a day (QID) | INTRAMUSCULAR | Status: DC | PRN
  Administered 2017-12-25: 4 mg via INTRAVENOUS

## 2017-12-24 MED ORDER — ASPIRIN EC 81 MG PO TBEC
81.0000 mg | DELAYED_RELEASE_TABLET | Freq: Every day | ORAL | Status: DC
  Administered 2017-12-26: 10:00:00 81 mg via ORAL
  Filled 2017-12-24 (×2): qty 1

## 2017-12-24 MED ORDER — NITROGLYCERIN 0.4 MG SL SUBL: 0.4000 mg | SUBLINGUAL_TABLET | SUBLINGUAL | Status: DC | PRN

## 2017-12-24 MED ORDER — SODIUM CHLORIDE 0.9% FLUSH: 3.0000 mL | INTRAVENOUS | Status: DC | PRN

## 2017-12-24 MED ORDER — SODIUM CHLORIDE 0.9 % WEIGHT BASED INFUSION
1.0000 mL/kg/h | INTRAVENOUS | Status: DC
  Administered 2017-12-25: 1 mL/kg/h via INTRAVENOUS

## 2017-12-24 MED ORDER — HEPARIN BOLUS VIA INFUSION
4000.0000 [IU] | Freq: Once | INTRAVENOUS | Status: AC
  Administered 2017-12-24: 4000 [IU] via INTRAVENOUS
  Filled 2017-12-24: qty 4000

## 2017-12-24 MED ORDER — ASPIRIN 300 MG RE SUPP: 300.0000 mg | RECTAL | Status: DC

## 2017-12-24 MED ORDER — ASPIRIN 81 MG PO CHEW: 324.0000 mg | CHEWABLE_TABLET | ORAL | Status: DC

## 2017-12-24 MED ORDER — ALBUTEROL SULFATE (2.5 MG/3ML) 0.083% IN NEBU: 2.5000 mg | INHALATION_SOLUTION | RESPIRATORY_TRACT | Status: DC | PRN

## 2017-12-24 MED ORDER — ASPIRIN 81 MG PO CHEW
81.0000 mg | CHEWABLE_TABLET | ORAL | Status: AC
  Administered 2017-12-25: 81 mg via ORAL
  Filled 2017-12-24: qty 1

## 2017-12-24 MED ORDER — FUROSEMIDE 10 MG/ML IJ SOLN
20.0000 mg | Freq: Once | INTRAMUSCULAR | Status: AC
  Administered 2017-12-24: 20 mg via INTRAVENOUS
  Filled 2017-12-24: qty 2

## 2017-12-24 MED ORDER — SODIUM CHLORIDE 0.9 % IV SOLN
INTRAVENOUS | Status: DC
  Administered 2017-12-24: 12:00:00 via INTRAVENOUS

## 2017-12-24 MED ORDER — RAMIPRIL 2.5 MG PO CAPS
2.5000 mg | ORAL_CAPSULE | Freq: Every day | ORAL | Status: DC
  Administered 2017-12-24 – 2017-12-26 (×3): 2.5 mg via ORAL
  Filled 2017-12-24 (×3): qty 1

## 2017-12-24 MED ORDER — HEPARIN (PORCINE) IN NACL 100-0.45 UNIT/ML-% IJ SOLN
1450.0000 [IU]/h | INTRAMUSCULAR | Status: DC
  Administered 2017-12-24: 1200 [IU]/h via INTRAVENOUS
  Filled 2017-12-24 (×2): qty 250

## 2017-12-24 MED ORDER — NITROGLYCERIN 0.4 MG SL SUBL
0.4000 mg | SUBLINGUAL_TABLET | SUBLINGUAL | Status: DC | PRN
Start: 2017-12-24 — End: 2017-12-26
  Administered 2017-12-24: 0.4 mg via SUBLINGUAL
  Filled 2017-12-24: qty 1

## 2017-12-24 MED ORDER — CARVEDILOL 3.125 MG PO TABS
3.1250 mg | ORAL_TABLET | Freq: Two times a day (BID) | ORAL | Status: DC
  Filled 2017-12-24 (×3): qty 1

## 2017-12-24 MED ORDER — CARVEDILOL 3.125 MG PO TABS
3.1250 mg | ORAL_TABLET | Freq: Two times a day (BID) | ORAL | Status: DC
  Administered 2017-12-24 – 2017-12-26 (×3): 3.125 mg via ORAL
  Filled 2017-12-24 (×4): qty 1

## 2017-12-24 NOTE — ED Notes (Signed)
Patient transported to echo ?

## 2017-12-24 NOTE — ED Triage Notes (Addendum)
Patient complains of tightness in shoulders, soreness radiating into jaw, and nausea. Reports he has had three heart attacks and this feels very similar to those. Pain started when he woke this morning at approximately 0100. Patient self-administered 324mg  aspirin at 0300 this morning.  Reports he has nitroglycerin tablets but does not take them because they give him a severe headache.

## 2017-12-24 NOTE — Progress Notes (Signed)
  Echocardiogram 2D Echocardiogram has been performed.  [name] 12/24/2017, 2:31 PM

## 2017-12-24 NOTE — ED Provider Notes (Addendum)
[facility] EMERGENCY DEPARTMENT Provider Note   CSN: [id number] Arrival date & time: 12/24/17  6295     History   Chief Complaint Chief Complaint  Patient presents with  . Chest Pain    HPI JUSTIS CLOSSER is a 62 y.o. male.  HPI   62 year old male presenting with chest pain.  Patient has stent to the RCA in 2014.  Patient had a repeat cath done in 2016 showing diffuse CAD with EF of 30%.  Followed by Dr. Antoine Poche as an outpatient.  Patient presenting today with pain in bilateral shoulders rating up to the jaw.  Increasing shortness of breath walking short distances.  Pain started and will come from sleep around 1 AM he had acute nausea and then pain started in bilateral shoulders radiating to jaw.  Patient did not take nitro because of already headache.  No long car rides, prolonged immobility or long plane rides.  No swelling in lower extremities.  Past Medical History:  Diagnosis Date  . ASCVD (arteriosclerotic cardiovascular disease)    a. MI 2004, did not seek care at that time. b. s/p PTCA/BMS to OM1 04/2011 (known totally occluded RCA with L-R collaterals and failed angioplasty on that vessel).  . Chronic bronchitis (HCC)   . HTN (hypertension)   . Hyperlipidemia   . LV dysfunction    EF 45% by cath 2012  . MI (myocardial infarction) (HCC)    x2  . OSA (obstructive sleep apnea) 05/24/11  . Restless leg syndrome 06/25/2011  . Type 2 diabetes mellitus [facility])     Patient Active Problem List   Diagnosis Date Noted  . Cardiomyopathy, ischemic   . Essential hypertension 11/25/2012  . Chronic bronchitis (HCC) 11/25/2012  . Exertional chest pain 11/25/2012  . Diabetes mellitus, new onset (HCC) 11/25/2012  . Bradycardia 11/25/2012  . Restless leg syndrome 06/25/2011  . Hyperglycemia 06/05/2011  . Dyslipidemia 06/05/2011  . OSA (obstructive sleep apnea) 06/05/2011  . CAD (coronary artery disease) 05/09/2011  . Hyperlipidemia     Past Surgical  History:  Procedure Laterality Date  . CARDIAC CATHETERIZATION  11/24/2012   40-50% ostial LAD, 40% mid LAD, 60-70% ostial D1, 40% proximal LCx, patent OM branch stent, 99% serial proximal-mid RCA lesions, 100% mid RCA occlusion (chronic with left to right collateralization); EF 40%, inferior wall hypokinesis.  Marland Kitchen CARDIAC CATHETERIZATION N/A 05/31/2015   Procedure: Left Heart Cath and Coronary Angiography;  Surgeon: Kathleene Hazel, MD;  Location: [facility] INVASIVE CV LAB;  Service: Cardiovascular;  Laterality: N/A;  . CORONARY STENT PLACEMENT  04/2011  . CYSTECTOMY     Spine and jaw  . LEFT HEART CATHETERIZATION WITH CORONARY ANGIOGRAM N/A 11/24/2012   Procedure: LEFT HEART CATHETERIZATION WITH CORONARY ANGIOGRAM;  Surgeon: Kathleene Hazel, MD;  Location: [facility] CATH LAB;  Service: Cardiovascular;  Laterality: N/A;  . lesion removed from foot     right foot  . TONSILLECTOMY AND ADENOIDECTOMY          Home Medications    Prior to Admission medications   Medication Sig Start Date End Date Taking? Authorizing Provider  aspirin 81 MG tablet Take 81 mg by mouth every evening.     [provider]  levalbuterol Pauline Aus HFA) 45 MCG/ACT inhaler Inhale 1-2 puffs into the lungs every 4 (four) hours as needed for wheezing. 11/25/12   Arguello, Roger A, PA-C  nitroGLYCERIN (NITROSTAT) 0.4 MG SL tablet Place 1 tablet (0.4 mg total) under the tongue every 5 (  five) minutes as needed for chest pain. 04/20/15   Rollene Rotunda, MD  pantoprazole (PROTONIX) 40 MG tablet Take 1 tablet (40 mg total) by mouth every evening. 03/29/13   Rollene Rotunda, MD  pramipexole (MIRAPEX) 1 MG tablet Take 0.5 mg by mouth 4 (four) times daily.     [provider]  ramipril (ALTACE) 2.5 MG capsule Take 1 capsule (2.5 mg  total) by mouth every  evening.    Rollene Rotunda, MD    Family History Family History  Problem Relation Age of Onset  . Lymphoma Mother   . Heart attack Father 67  . Heart attack  Brother 63    Social History Social History   Tobacco Use  . Smoking status: Former Smoker    Packs/day: 1.50    Years: 35.00    Pack years: 52.50    Types: Cigarettes    Last attempt to quit: 08/18/2005    Years since quitting: 12.3  . Smokeless tobacco: Never Used  Substance Use Topics  . Alcohol use: No  . Drug use: No     Allergies   Lipitor [atorvastatin calcium] and Niaspan [niacin er]   Review of Systems Review of Systems  Constitutional: Positive for fatigue. Negative for activity change and fever.  Respiratory: Positive for chest tightness and shortness of breath.   Cardiovascular: Positive for chest pain.  Gastrointestinal: Negative for abdominal pain.  All other systems reviewed and are negative.    Physical Exam Updated Vital Signs BP (!) 126/95   Pulse 82   Temp (!) 97.5 F (36.4 C) (Oral)   Resp 19   Ht 5\' 9"  (1.753 m)   Wt 95.3 kg (210 lb)   SpO2 90%   BMI 31.01 kg/m   Physical Exam  Constitutional: He is oriented to person, place, and time. He appears well-nourished.  HENT:  Head: Normocephalic.  Eyes: Conjunctivae are normal.  Cardiovascular: Normal rate, intact distal pulses and normal pulses.  Pulmonary/Chest: Effort normal and breath sounds normal. No accessory muscle usage. No respiratory distress.  Abdominal: Soft. Bowel sounds are normal. There is no tenderness.  Mild truncal obesity.  Musculoskeletal:       Right lower leg: He exhibits no edema.       Left lower leg: He exhibits no edema.  Neurological: He is oriented to person, place, and time.  Skin: Skin is warm and dry. He is not diaphoretic.  Psychiatric: He has a normal mood and affect. His behavior is normal.  Nursing note and vitals reviewed.    ED Treatments / Results  Labs (all labs ordered are listed, but only abnormal results are displayed) Labs Reviewed  BASIC METABOLIC PANEL - Abnormal; Notable for the following components:      Result Value   CO2 20 (*)     Glucose, Bld 171 (*)    All other components within normal limits  CBC - Abnormal; Notable for the following components:   WBC 14.6 (*)    Hemoglobin 18.0 (*)    HCT 52.9 (*)    Platelets 134 (*)    All other components within normal limits  I-STAT TROPONIN, ED    EKG EKG Interpretation  Date/Time:  Thursday Dec 24 2017 07:44:31 EDT Ventricular Rate:  87 PR Interval:  168 QRS Duration: 118 QT Interval:  388 QTC Calculation: 466 R Axis:   -172 Text Interpretation:  similar to prior EKGs  Confirmed by Bary Castilla ([id number]) on 12/24/2017 8:26:12 AM   Radiology  No results found.  Procedures Procedures (including critical care time)  CRITICAL CARE Performed by: Arlana Hove Total critical care time: 65 minutes Critical care time was exclusive of separately billable procedures and treating other patients. Critical care was necessary to treat or prevent imminent or life-threatening deterioration. Critical care was time spent personally by me on the following activities: development of treatment plan with patient and/or surrogate as well as nursing, discussions with consultants, evaluation of patient's response to treatment, examination of patient, obtaining history from patient or surrogate, ordering and performing treatments and interventions, ordering and review of laboratory studies, ordering and review of radiographic studies, pulse oximetry and re-evaluation of patient's condition.   Medications Ordered in ED Medications - No data to display   Initial Impression / Assessment and Plan / ED Course  I have reviewed the triage vital signs and the nursing notes.  Pertinent labs & imaging results that were available during my care of the patient were reviewed by me and considered in my medical decision making (see chart for details).     62 year old male presenting with chest pain.  Patient has stent to the RCA in 2014.  Patient had a repeat cath done in 2016  showing diffuse CAD with EF of 30%.  Followed by Dr. Antoine Poche as an outpatient.  Patient presenting today with pain in bilateral shoulders rating up to the jaw.  Increasing shortness of breath walking short distances.  Pain started and will come from sleep around 1 AM he had acute nausea and then pain started in bilateral shoulders radiating to jaw.  Patient did not take nitro because of already headache.  No long car rides, prolonged immobility or long plane rides.  No swelling in lower extremities.  8:42 AM Patient here with active chest pain equivalents including shoulder pain while radiating to the jaw.  Patient has known diffuse coronary artery disease with stent to RCA.  Given high risk, will discuss with cardiology.  We will give Lasix given edema seen on x-ray.    Final Clinical Impressions(s) / ED Diagnoses   Final diagnoses:  None    ED Discharge Orders    None       Abelino Derrick, MD 12/25/17 1425    Ondra Deboard, Cindee Salt, MD 01/05/18 1008

## 2017-12-24 NOTE — ED Notes (Signed)
Heart healthy diet ordered - primary nurse and pt made aware of same

## 2017-12-24 NOTE — H&P (Signed)
Cardiology Consultation:   Patient ID: [name]; [id number]; [birth year]   Admit date: 12/24/2017 Date of Consult: 12/24/2017  Primary Care Provider: Abigail Miyamoto, MD Primary Cardiologist: Dr Antoine Poche   Patient Profile:   [name] is a 62 y.o. male with a hx of coronary artery disease, ischemic cardiomyopathy, diabetes mellitus, hypertension, hyperlipidemia who is being seen today for the evaluation of UA.   History of Present Illness:   [name] has a history of coronary artery disease with prior myocardial infarction.  Echocardiogram September 2016 showed ejection fraction 30%, grade 1 diastolic dysfunction, mild to moderate RV dysfunction and mild right atrial enlargement.  Last cardiac catheterization in October 2016 showed an occluded right coronary artery, patent stent in the first obtuse marginal, 30% circumflex, 30% second marginal, 40% proximal LAD, 60% first diagonal and 30% LAD.  Note the right filled via left-to-right collaterals.  Medical therapy recommended.  Patient has not followed up since that time.  Over the past 3 to 4 weeks he notices increased dyspnea on exertion.  There is no orthopnea, PND or pedal edema.  No exertional chest pain.  At 1 AM he awoke with bilateral shoulder tightness followed by jaw tightness.  No chest pain.  There was some dyspnea and nausea.  Symptoms are similar but less severe compared to prior myocardial infarction pain.  He presented to the emergency room at approximately 7 AM and his symptoms have now resolved.  Cardiology asked to evaluate.  Past Medical History:  Diagnosis Date  . ASCVD (arteriosclerotic cardiovascular disease)    a. MI 2004, did not seek care at that time. b. s/p PTCA/BMS to OM1 04/2011 (known totally occluded RCA with L-R collaterals and failed angioplasty on that vessel).  . Chronic bronchitis (HCC)   . HTN (hypertension)   . Hyperlipidemia   . LV dysfunction    EF 45% by cath 2012  . MI (myocardial  infarction) (HCC)    x2  . OSA (obstructive sleep apnea) 05/24/11  . Restless leg syndrome 06/25/2011  . Type 2 diabetes mellitus (HCC)     Past Surgical History:  Procedure Laterality Date  . CARDIAC CATHETERIZATION  11/24/2012   40-50% ostial LAD, 40% mid LAD, 60-70% ostial D1, 40% proximal LCx, patent OM branch stent, 99% serial proximal-mid RCA lesions, 100% mid RCA occlusion (chronic with left to right collateralization); EF 40%, inferior wall hypokinesis.  Marland Kitchen CARDIAC CATHETERIZATION N/A 05/31/2015   Procedure: Left Heart Cath and Coronary Angiography;  Surgeon: Kathleene Hazel, MD;  Location: [facility] INVASIVE CV LAB;  Service: Cardiovascular;  Laterality: N/A;  . CORONARY STENT PLACEMENT  04/2011  . CYSTECTOMY     Spine and jaw  . LEFT HEART CATHETERIZATION WITH CORONARY ANGIOGRAM N/A 11/24/2012   Procedure: LEFT HEART CATHETERIZATION WITH CORONARY ANGIOGRAM;  Surgeon: Kathleene Hazel, MD;  Location: [facility] CATH LAB;  Service: Cardiovascular;  Laterality: N/A;  . lesion removed from foot     right foot  . TONSILLECTOMY AND ADENOIDECTOMY       Inpatient Medications: Scheduled Meds:  Continuous Infusions:  PRN Meds: nitroGLYCERIN  Allergies:    Allergies  Allergen Reactions  . Lipitor [Atorvastatin Calcium]   . Niaspan Odette Fraction Er]     Social History:   Social History   Socioeconomic History  . Marital status: Married    Spouse name: Not on file  . Number of children: 1  . Years of education: Not on file  . Highest education level: Not  on file  Occupational History  . Occupation: Wellsite geologist   Social Needs  . Financial resource strain: Not on file  . Food insecurity:    Worry: Not on file    Inability: Not on file  . Transportation needs:    Medical: Not on file    Non-medical: Not on file  Tobacco Use  . Smoking status: Former Smoker    Packs/day: 1.50    Years: 35.00    Pack years: 52.50    Types: Cigarettes    Last attempt to quit: 08/18/2005     Years since quitting: 12.3  . Smokeless tobacco: Never Used  Substance and Sexual Activity  . Alcohol use: No  . Drug use: No  . Sexual activity: Not Currently  Lifestyle  . Physical activity:    Days per week: Not on file    Minutes per session: Not on file  . Stress: Not on file  Relationships  . Social connections:    Talks on phone: Not on file    Gets together: Not on file    Attends religious service: Not on file    Active member of club or organization: Not on file    Attends meetings of clubs or organizations: Not on file    Relationship status: Not on file  . Intimate partner violence:    Fear of current or ex partner: Not on file    Emotionally abused: Not on file    Physically abused: Not on file    Forced sexual activity: Not on file  Other Topics Concern  . Not on file  Social History Narrative   Married with 1 grown daughter who is a Diplomatic Services operational officer for a school          Family History:    Family History  Problem Relation Age of Onset  . Lymphoma Mother   . Heart attack Father 47  . Heart attack Brother 51     ROS:  Please see the history of present illness.  Mild dyspnea on exertion and peripheral neuropathy with restless leg syndrome. All other ROS reviewed and negative.     Physical Exam/Data:   Vitals:   12/24/17 0900 12/24/17 0921 12/24/17 0945 12/24/17 1000  BP: (!) 125/92 136/85 112/73 125/66  Pulse: 77 79 78 80  Resp:   15 16  Temp:      TempSrc:      SpO2: 90% 90% 95% 93%  Weight:      Height:       No intake or output data in the 24 hours ending 12/24/17 1105 Filed Weights   12/24/17 0746  Weight: 210 lb (95.3 kg)   Body mass index is 31.01 kg/m.  General:  Well nourished, obese in no acute distress HEENT: normal Lymph: no adenopathy Neck: no JVD Endocrine:  No thryomegaly Vascular: No carotid bruits; FA pulses 2+ bilaterally without bruits  Cardiac:  normal S1, S2; RRR; no murmur  Lungs:  clear to auscultation bilaterally, no  wheezing, rhonchi or rales  Abd: soft, nontender, no hepatomegaly  Ext: no edema Musculoskeletal:  No deformities, BUE and BLE strength normal and equal Skin: warm and dry  Neuro:  CNs 2-12 intact, no focal abnormalities noted Psych:  Normal affect   EKG:  The EKG was personally reviewed and demonstrates: Sinus rhythm with PVCs.  Probable limb lead reversal.  Inferior infarct.   Laboratory Data:  Chemistry Recent Labs  Lab 12/24/17 0746  NA 137  K 4.3  CL 105  CO2 20*  GLUCOSE 171*  BUN 20  CREATININE 1.00  CALCIUM 9.4  GFRNONAA >60  GFRAA >60  ANIONGAP 12    Hematology Recent Labs  Lab 12/24/17 0746  WBC 14.6*  RBC 5.70  HGB 18.0*  HCT 52.9*  MCV 92.8  MCH 31.6  MCHC 34.0  RDW 14.1  PLT 134*    Recent Labs  Lab 12/24/17 0755  TROPIPOC 0.02    Radiology/Studies:  Dg Chest 2 View  Result Date: 12/24/2017 CLINICAL DATA:  Shortness of breath for 3 weeks, awoke this morning with nausea, sensation of shoulder heaviness, slight jaw pain, history of coronary disease post MI x 2, hypertension, diabetes mellitus, former smoker EXAM: CHEST - 2 VIEW COMPARISON:  11/23/2012 FINDINGS: Enlargement of cardiac silhouette. Mediastinal contours and pulmonary vascularity normal. Slightly prominent superior mediastinal soft tissues at the thoracic inlet appears stable. Diffuse interstitial prominence in the mid to lower lungs bilaterally slightly increased since previous exam, suspect mild pulmonary edema. No pleural effusion or pneumothorax. Bones unremarkable. IMPRESSION: Enlargement of cardiac silhouette with increased interstitial markings in the mid to lower lungs increased since previous exam, question mild pulmonary edema. Electronically Signed   By: Ulyses Southward M.D.   On: 12/24/2017 08:44    Assessment and Plan:   1. Unstable angina-patient presents with symptoms concerning for unstable angina.  He developed bilateral shoulder tightness and jaw tightness this morning which  awoke him from sleep.  Symptoms now resolved.  Plan to treat with aspirin, heparin and add low-dose beta-blockade.  He is intolerant to statins.  I feel definitive evaluation is warranted.  Plan cardiac catheterization.  The risks and benefits including myocardial infarction, CVA and death discussed and he agrees to proceed.  Hold Glucophage for 48 hours following procedure. 2. Ischemic cardiomyopathy-last assessment of LV function showed ejection fraction 30%.  Plan to continue ACE inhibitor.  Add carvedilol 3.125 mg twice daily.  Titrate medications as tolerated by pulse and blood pressure.  Repeat echocardiogram.  Once medications fully titrated as an outpatient would repeat echocardiogram and if ejection fraction less than 35% would need to consider ICD. 3. Hypertension-follow blood pressure with medication adjustments as outlined above.  Advance medications based on follow-up readings. 4. History of hyperlipidemia-patient is intolerant to statins.  Check lipids.  If LDL greater than 70 would consider referral to lipid clinic for consideration of Repatha. 5. Elevated white blood cell count-we will plan follow-up CBC tomorrow morning. 6. Diabetes mellitus-follow CBGs.  Hold Glucophage following cardiac catheterization for 48 hours.   For questions or updates, please contact [facility] Please consult [email or web address] for contact info under Cardiology/STEMI.   Signed, Olga Millers, MD  12/24/2017 11:05 AM

## 2017-12-24 NOTE — Progress Notes (Signed)
ANTICOAGULATION CONSULT NOTE - Initial Consult  Pharmacy Consult for Heparin Indication: chest pain/ACS  Allergies  Allergen Reactions  . Lipitor [Atorvastatin Calcium]   . Niaspan [Niacin Er]    Patient Measurements: Height: 5\' 9"  (175.3 cm) Weight: 210 lb (95.3 kg) IBW/kg (Calculated) : 70.7 Heparin Dosing Weight: 90.4 kg  Vital Signs: Temp: 97.5 F (36.4 C) (05/09 0745) Temp Source: Oral (05/09 0745) BP: 125/66 (05/09 1000) Pulse Rate: 80 (05/09 1000)  Labs: Recent Labs    12/24/17 0746  HGB 18.0*  HCT 52.9*  PLT 134*  CREATININE 1.00   Estimated Creatinine Clearance: 87.2 mL/min (by C-G formula based on SCr of 1 mg/dL).  Assessment: 34 yoM presenting with unstable angina. Pharmacy consulted to dose IV heparin for r/o ACS. No AC PTA. Hgb 18, pltc 134. No bleeding noted.  Goal of Therapy:  Heparin level 0.3-0.7 units/ml Monitor platelets by anticoagulation protocol: Yes   Plan:  Give heparin 4000 units bolus x 1 Start heparin infusion at 1200 units/hr Check heparin level in 6 hours Daily heparin level and CBC Monitor for s/sx of bleeding  Mackynzie Woolford N. Zigmund Daniel, PharmD PGY1 Pharmacy Resident Pager: [phone] 12/24/2017,11:43 AM

## 2017-12-24 NOTE — Progress Notes (Signed)
ANTICOAGULATION CONSULT NOTE  Pharmacy Consult:  Heparin Indication: chest pain/ACS  Allergies  Allergen Reactions  . Lipitor [Atorvastatin Calcium]   . Niaspan [Niacin Er]    Patient Measurements: Height: 5\' 9"  (175.3 cm) Weight: 210 lb (95.3 kg) IBW/kg (Calculated) : 70.7 Heparin Dosing Weight: 90 kg  Vital Signs: Temp: 97.6 F (36.4 C) (05/09 1903) Temp Source: Oral (05/09 1903) BP: 105/75 (05/09 1903) Pulse Rate: 86 (05/09 1903)  Labs: Recent Labs    12/24/17 0746 12/24/17 1224 12/24/17 2019  HGB 18.0*  --   --   HCT 52.9*  --   --   PLT 134*  --   --   HEPARINUNFRC  --   --  0.30  CREATININE 1.00  --   --   TROPONINI  --  <0.03  --    Estimated Creatinine Clearance: 87.2 mL/min (by C-G formula based on SCr of 1 mg/dL).   Assessment: 34 YOM presented with unstable angina and Pharmacy consulted to manage IV heparin.  Heparin level is therapeutic and toward the low end of normal.  No bleeding reported.   Goal of Therapy:  Heparin level 0.3-0.7 units/ml Monitor platelets by anticoagulation protocol: Yes    Plan:  Increase heparin gtt to 1300 units/hr F/U AM labs   Edward Guthmiller D. Laney Potash, PharmD, BCPS, BCCCP Pager:  [phone] 12/24/2017, 8:52 PM

## 2017-12-25 ENCOUNTER — Encounter (HOSPITAL_COMMUNITY): Payer: Self-pay | Admitting: Cardiology

## 2017-12-25 ENCOUNTER — Inpatient Hospital Stay (HOSPITAL_COMMUNITY)
Admission: EM | Disposition: A | Discharge status: Home / Self Care | Payer: Self-pay | Source: Home / Self Care | Attending: Cardiology

## 2017-12-25 DIAGNOSIS — I5023 Acute on chronic systolic (congestive) heart failure: Secondary | ICD-10-CM | POA: Diagnosis present

## 2017-12-25 DIAGNOSIS — I25110 Atherosclerotic heart disease of native coronary artery with unstable angina pectoris: Principal | ICD-10-CM

## 2017-12-25 HISTORY — PX: CORONARY STENT INTERVENTION: CATH118234

## 2017-12-25 HISTORY — PX: LEFT HEART CATH AND CORONARY ANGIOGRAPHY: CATH118249

## 2017-12-25 LAB — POCT ACTIVATED CLOTTING TIME: Activated Clotting Time: 494 seconds

## 2017-12-25 LAB — BASIC METABOLIC PANEL
Anion gap: 10 (ref 5–15)
BUN: 20 mg/dL (ref 6–20)
CO2: 22 mmol/L (ref 22–32)
CREATININE: 1.01 mg/dL (ref 0.61–1.24)
Calcium: 8.5 mg/dL — ABNORMAL LOW (ref 8.9–10.3)
Chloride: 104 mmol/L (ref 101–111)
GFR calc Af Amer: >60 mL/min (ref >60)
GLUCOSE: 162 mg/dL — AB (ref 65–99)
Potassium: 3.8 mmol/L (ref 3.5–5.1)
SODIUM: 136 mmol/L (ref 135–145)

## 2017-12-25 LAB — CBC
HCT: 48.5 % (ref 39.0–52.0)
Hemoglobin: 16.3 g/dL (ref 13.0–17.0)
MCH: 31.3 pg (ref 26.0–34.0)
MCHC: 33.6 g/dL (ref 30.0–36.0)
MCV: 93.3 fL (ref 78.0–100.0)
PLATELETS: 122 10*3/uL — AB (ref 150–400)
RBC: 5.2 MIL/uL (ref 4.22–5.81)
RDW: 13.9 % (ref 11.5–15.5)
WBC: 8 10*3/uL (ref 4.0–10.5)

## 2017-12-25 LAB — GLUCOSE, CAPILLARY
Glucose-Capillary: 157 mg/dL — ABNORMAL HIGH (ref 65–99)
Glucose-Capillary: 126 mg/dL — ABNORMAL HIGH (ref 65–99)
Glucose-Capillary: 116 mg/dL — ABNORMAL HIGH (ref 65–99)
Glucose-Capillary: 153 mg/dL — ABNORMAL HIGH (ref 65–99)

## 2017-12-25 LAB — PROTIME-INR
INR: 1.03
PROTHROMBIN TIME: 13.4 s (ref 11.4–15.2)

## 2017-12-25 LAB — TROPONIN I: Troponin I: <0.03 ng/mL (ref <0.03)

## 2017-12-25 LAB — HIV ANTIBODY (ROUTINE TESTING W REFLEX): HIV Screen 4th Generation wRfx: NONREACTIVE

## 2017-12-25 LAB — LIPID PANEL
CHOLESTEROL: 127 mg/dL (ref 0–200)
HDL: 33 mg/dL — ABNORMAL LOW (ref >40)
LDL Cholesterol: 68 mg/dL (ref 0–99)
Total CHOL/HDL Ratio: 3.8 RATIO
Triglycerides: 128 mg/dL (ref <150)
VLDL: 26 mg/dL (ref 0–40)

## 2017-12-25 LAB — C DIFFICILE QUICK SCREEN W PCR REFLEX
C Diff antigen: NEGATIVE
C Diff interpretation: NOT DETECTED
C Diff toxin: NEGATIVE

## 2017-12-25 LAB — HEPARIN LEVEL (UNFRACTIONATED): Heparin Unfractionated: 0.26 IU/mL — ABNORMAL LOW (ref 0.30–0.70)

## 2017-12-25 SURGERY — LEFT HEART CATH AND CORONARY ANGIOGRAPHY
Anesthesia: LOCAL

## 2017-12-25 MED ORDER — TICAGRELOR 90 MG PO TABS
90.0000 mg | ORAL_TABLET | Freq: Two times a day (BID) | ORAL | Status: DC
  Administered 2017-12-25 – 2017-12-26 (×2): 90 mg via ORAL
  Filled 2017-12-25 (×2): qty 1

## 2017-12-25 MED ORDER — HEPARIN SODIUM (PORCINE) 1000 UNIT/ML IJ SOLN
INTRAMUSCULAR | Status: DC | PRN
  Administered 2017-12-25: 5000 [IU] via INTRAVENOUS
  Administered 2017-12-25: 4000 [IU] via INTRAVENOUS

## 2017-12-25 MED ORDER — IOPAMIDOL (ISOVUE-370) INJECTION 76%
INTRAVENOUS | Status: AC
  Filled 2017-12-25: qty 100

## 2017-12-25 MED ORDER — LOPERAMIDE HCL 2 MG PO CAPS: 2.0000 mg | ORAL_CAPSULE | ORAL | Status: DC | PRN

## 2017-12-25 MED ORDER — FENTANYL CITRATE (PF) 100 MCG/2ML IJ SOLN
INTRAMUSCULAR | Status: DC | PRN
  Administered 2017-12-25: 25 ug via INTRAVENOUS

## 2017-12-25 MED ORDER — LIDOCAINE HCL (PF) 1 % IJ SOLN
INTRAMUSCULAR | Status: DC | PRN
  Administered 2017-12-25: 2 mL

## 2017-12-25 MED ORDER — TICAGRELOR 90 MG PO TABS
ORAL_TABLET | ORAL | Status: DC | PRN
  Administered 2017-12-25: 180 mg via ORAL

## 2017-12-25 MED ORDER — HEPARIN (PORCINE) IN NACL 1000-0.9 UT/500ML-% IV SOLN
INTRAVENOUS | Status: AC
  Filled 2017-12-25: qty 1000

## 2017-12-25 MED ORDER — VERAPAMIL HCL 2.5 MG/ML IV SOLN
INTRAVENOUS | Status: AC
  Filled 2017-12-25: qty 2

## 2017-12-25 MED ORDER — ANGIOPLASTY BOOK
Freq: Once | Status: AC
  Administered 2017-12-25: 1
  Filled 2017-12-25: qty 1

## 2017-12-25 MED ORDER — LIDOCAINE HCL (PF) 1 % IJ SOLN
INTRAMUSCULAR | Status: AC
  Filled 2017-12-25: qty 30

## 2017-12-25 MED ORDER — SODIUM CHLORIDE 0.9 % IV SOLN: 250.0000 mL | INTRAVENOUS | Status: DC | PRN

## 2017-12-25 MED ORDER — LOPERAMIDE HCL 2 MG PO CAPS
4.0000 mg | ORAL_CAPSULE | Freq: Once | ORAL | Status: AC
  Administered 2017-12-25: 4 mg via ORAL
  Filled 2017-12-25: qty 2

## 2017-12-25 MED ORDER — SODIUM CHLORIDE 0.9% FLUSH
3.0000 mL | Freq: Two times a day (BID) | INTRAVENOUS | Status: DC
  Administered 2017-12-25: 18:00:00 3 mL via INTRAVENOUS

## 2017-12-25 MED ORDER — IOPAMIDOL (ISOVUE-370) INJECTION 76%
INTRAVENOUS | Status: AC
  Filled 2017-12-25: qty 50

## 2017-12-25 MED ORDER — FUROSEMIDE 10 MG/ML IJ SOLN
20.0000 mg | Freq: Once | INTRAMUSCULAR | Status: AC
  Administered 2017-12-25: 15:00:00 20 mg via INTRAVENOUS
  Filled 2017-12-25: qty 2

## 2017-12-25 MED ORDER — HEPARIN (PORCINE) IN NACL 2-0.9 UNITS/ML
INTRAMUSCULAR | Status: AC | PRN
  Administered 2017-12-25 (×2): 500 mL

## 2017-12-25 MED ORDER — SODIUM CHLORIDE 0.9% FLUSH: 3.0000 mL | INTRAVENOUS | Status: DC | PRN

## 2017-12-25 MED ORDER — HYDROCODONE-ACETAMINOPHEN 7.5-325 MG PO TABS
1.0000 | ORAL_TABLET | Freq: Four times a day (QID) | ORAL | Status: DC | PRN
  Administered 2017-12-25: 10:00:00 1 via ORAL
  Filled 2017-12-25: qty 1

## 2017-12-25 MED ORDER — SODIUM CHLORIDE 0.9 % WEIGHT BASED INFUSION
1.0000 mL/kg/h | INTRAVENOUS | Status: AC
  Administered 2017-12-25: 1 mL/kg/h via INTRAVENOUS

## 2017-12-25 MED ORDER — TICAGRELOR 90 MG PO TABS
ORAL_TABLET | ORAL | Status: AC
  Filled 2017-12-25: qty 2

## 2017-12-25 MED ORDER — IOPAMIDOL (ISOVUE-370) INJECTION 76%
INTRAVENOUS | Status: DC | PRN
  Administered 2017-12-25: 120 mL via INTRA_ARTERIAL

## 2017-12-25 MED ORDER — HEPARIN SODIUM (PORCINE) 1000 UNIT/ML IJ SOLN
INTRAMUSCULAR | Status: AC
  Filled 2017-12-25: qty 1

## 2017-12-25 MED ORDER — VERAPAMIL HCL 2.5 MG/ML IV SOLN
INTRAVENOUS | Status: DC | PRN
  Administered 2017-12-25: 10 mL via INTRA_ARTERIAL

## 2017-12-25 MED ORDER — FENTANYL CITRATE (PF) 100 MCG/2ML IJ SOLN
INTRAMUSCULAR | Status: AC
  Filled 2017-12-25: qty 2

## 2017-12-25 SURGICAL SUPPLY — 20 items
BALLN SAPPHIRE 2.5X12 (BALLOONS) ×2
BALLN ~~LOC~~ EMERGE MR 3.75X8 (BALLOONS) ×2
BALLOON SAPPHIRE 2.5X12 (BALLOONS) ×1 IMPLANT
BALLOON ~~LOC~~ EMERGE MR 3.75X8 (BALLOONS) ×1 IMPLANT
CATH INFINITI 5 FR JL3.5 (CATHETERS) ×2 IMPLANT
CATH INFINITI 5FR ANG PIGTAIL (CATHETERS) ×2 IMPLANT
CATH INFINITI JR4 5F (CATHETERS) ×2 IMPLANT
CATH LAUNCHER 6FR EBU3.5 (CATHETERS) ×2 IMPLANT
DEVICE RAD COMP TR BAND LRG (VASCULAR PRODUCTS) ×2 IMPLANT
GLIDESHEATH SLEND SS 6F .021 (SHEATH) ×2 IMPLANT
GUIDEWIRE INQWIRE 1.5J.035X260 (WIRE) ×1 IMPLANT
INQWIRE 1.5J .035X260CM (WIRE) ×2
KIT ENCORE 26 ADVANTAGE (KITS) ×2 IMPLANT
KIT HEART LEFT (KITS) ×2 IMPLANT
KIT HEMO VALVE WATCHDOG (MISCELLANEOUS) ×2 IMPLANT
PACK CARDIAC CATHETERIZATION (CUSTOM PROCEDURE TRAY) ×2 IMPLANT
STENT SYNERGY DES 3.5X12 (Permanent Stent) ×2 IMPLANT
TRANSDUCER W/STOPCOCK (MISCELLANEOUS) ×2 IMPLANT
TUBING CIL FLEX 10 FLL-RA (TUBING) ×2 IMPLANT
WIRE ASAHI PROWATER 180CM (WIRE) ×2 IMPLANT

## 2017-12-25 NOTE — Progress Notes (Signed)
Patient noted to have history of OSA.  Does not use CPAP.  Observed to have very frequent episodes of apnea today.  Discussed with patient and wife the importance of treating this condition.  Gave several information packets relevant to the subject.

## 2017-12-25 NOTE — Progress Notes (Signed)
BRILINTA 90 MG BID  COVER- YES  CO-PAY- $ 150.00 FOR 90 DAY SUPPLY/ MUST USE M/O & Q/L          TWO PILL PER DAY  TIER 3 DRUG  PRIOR APPROVAL- NO  NO DEDUCTIBLE   PREFERRED PHARMACY : YES- [facility] M/[facility]

## 2017-12-25 NOTE — Care Management Note (Signed)
Case Management Note  Patient Details  Name: DANARI OLUND MRN: [id number] Date of Birth: [birth year]  Subjective/Objective:  From home with wife pta indep, will be on brilinta, NCM spoke with wife gave her the information about co pay 150.00 for mail order 3 month supply.  RN will give her the 30 day savings card for brilinta. Wife states they will be able to pay the 150 for 3 month supply with no problem.                   Action/Plan: DC home when ready.  Expected Discharge Date:                  Expected Discharge Plan:  Home/Self Care  In-House Referral:     Discharge planning Services  CM Consult  Post Acute Care Choice:    Choice offered to:     DME Arranged:    DME Agency:     HH Arranged:    HH Agency:     Status of Service:  Completed, signed off  If discussed at [employer] of Stay Meetings, dates discussed:    Additional Comments:  Leone Haven, RN 12/25/2017, 11:12 AM

## 2017-12-25 NOTE — Progress Notes (Signed)
TR BAND REMOVAL  LOCATION:  right radial  DEFLATED PER PROTOCOL:  Yes.    TIME BAND OFF / DRESSING APPLIED:   1230   SITE UPON ARRIVAL:   Level 0  SITE AFTER BAND REMOVAL:  Level 0  CIRCULATION SENSATION AND MOVEMENT:  Within Normal Limits  Yes.    COMMENTS:    

## 2017-12-25 NOTE — Interval H&P Note (Signed)
History and Physical Interval Note:  12/25/2017 7:27 AM  Lanny Cramp  has presented today for surgery, with the diagnosis of cp  The various methods of treatment have been discussed with the patient and family. After consideration of risks, benefits and other options for treatment, the patient has consented to  Procedure(s): LEFT HEART CATH AND CORONARY ANGIOGRAPHY (N/A) as a surgical intervention .  The patient's history has been reviewed, patient examined, no change in status, stable for surgery.  I have reviewed the patient's chart and labs.  Questions were answered to the patient's satisfaction.   Cath Lab Visit (complete for each Cath Lab visit)  Clinical Evaluation Leading to the Procedure:   ACS: Yes.    Non-ACS:    Anginal Classification: CCS III  Anti-ischemic medical therapy: No Therapy  Non-Invasive Test Results: No non-invasive testing performed  Prior CABG: No previous CABG        Theron Arista [facility] 12/25/2017 7:27 AM

## 2017-12-26 DIAGNOSIS — I5023 Acute on chronic systolic (congestive) heart failure: Secondary | ICD-10-CM

## 2017-12-26 DIAGNOSIS — I2510 Atherosclerotic heart disease of native coronary artery without angina pectoris: Secondary | ICD-10-CM

## 2017-12-26 LAB — BASIC METABOLIC PANEL
ANION GAP: 7 (ref 5–15)
BUN: 14 mg/dL (ref 6–20)
CO2: 25 mmol/L (ref 22–32)
Calcium: 8.6 mg/dL — ABNORMAL LOW (ref 8.9–10.3)
Chloride: 105 mmol/L (ref 101–111)
Creatinine, Ser: 1.08 mg/dL (ref 0.61–1.24)
GFR calc Af Amer: >60 mL/min (ref >60)
Glucose, Bld: 151 mg/dL — ABNORMAL HIGH (ref 65–99)
POTASSIUM: 3.7 mmol/L (ref 3.5–5.1)
SODIUM: 137 mmol/L (ref 135–145)

## 2017-12-26 LAB — GLUCOSE, CAPILLARY: Glucose-Capillary: 139 mg/dL — ABNORMAL HIGH (ref 65–99)

## 2017-12-26 LAB — CBC
HEMATOCRIT: 45.9 % (ref 39.0–52.0)
HEMOGLOBIN: 15.6 g/dL (ref 13.0–17.0)
MCH: 31.5 pg (ref 26.0–34.0)
MCHC: 34 g/dL (ref 30.0–36.0)
MCV: 92.5 fL (ref 78.0–100.0)
Platelets: 120 10*3/uL — ABNORMAL LOW (ref 150–400)
RBC: 4.96 MIL/uL (ref 4.22–5.81)
RDW: 14.1 % (ref 11.5–15.5)
WBC: 8.5 10*3/uL (ref 4.0–10.5)

## 2017-12-26 MED ORDER — SPIRONOLACTONE 12.5 MG HALF TABLET
12.5000 mg | ORAL_TABLET | Freq: Every day | ORAL | Status: DC
  Administered 2017-12-26: 10:00:00 12.5 mg via ORAL
  Filled 2017-12-26: qty 1

## 2017-12-26 MED ORDER — TICAGRELOR 90 MG PO TABS: 90.0000 mg | ORAL_TABLET | Freq: Two times a day (BID) | ORAL | 3 refills | Status: DC

## 2017-12-26 MED ORDER — SPIRONOLACTONE 25 MG PO TABS: 12.5000 mg | ORAL_TABLET | Freq: Every day | ORAL | 3 refills | Status: DC

## 2017-12-26 MED ORDER — CARVEDILOL 3.125 MG PO TABS: 3.1250 mg | ORAL_TABLET | Freq: Two times a day (BID) | ORAL | 11 refills | Status: DC

## 2017-12-26 NOTE — Discharge Summary (Signed)
Discharge Summary    Patient ID: [name],  MRN: [id number], DOB/AGE: 02/27/1956 62 y.o.  Admit date: 12/24/2017 Discharge date: 12/26/2017  Primary Care Provider: Abigail Miyamoto Primary Cardiologist: Dr. Antoine Poche   Discharge Diagnoses    Active Problems:   CAD (coronary artery disease)   Hyperlipidemia   Essential hypertension   Unstable angina (HCC)   Acute on chronic systolic CHF (congestive heart failure) (HCC)   ICM  Allergies Allergies  Allergen Reactions  . Lipitor [Atorvastatin Calcium]   . Niaspan [Niacin Er]     Diagnostic Studies/Procedures    CORONARY STENT INTERVENTION 12/25/17  LEFT HEART CATH AND CORONARY ANGIOGRAPHY  Conclusion     Prox RCA lesion is 99% stenosed.  Prox RCA to Mid RCA lesion is 90% stenosed.  Mid RCA lesion is 100% stenosed.  Prox LAD lesion is 40% stenosed.  Ost 1st Diag lesion is 90% stenosed.  Prox LAD to Mid LAD lesion is 10% stenosed.  Previously placed Ost 1st Mrg to 1st Mrg stent (unknown type) is widely patent.  Ost Cx to Prox Cx lesion is 90% stenosed.  Post intervention, there is a 0% residual stenosis.  A drug-eluting stent was successfully placed using a STENT SYNERGY DES 3.5X12.  Non-stenotic Prox Cx to Mid Cx lesion.  LV end diastolic pressure is normal.  1. 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 Normal LVEDP 3. Successful stenting of the proximal LCx with DES  Plan: DAPT for one year. Anticipate DC in am. Optimize CHF therapy.   Echo 12/24/17 Study Conclusions  - Left ventricle: The cavity size was mildly dilated. Systolic function was severely reduced. The estimated ejection fraction was in the range of 20% to 25%. Although no diagnostic regional wall motion abnormality was identified, this possibility  cannot be completely excluded on the basis of this study. - Aortic valve: Valve area (VTI): 2.37 cm^2. Valve area (Vmax): 2.26 cm^2. Valve area (Vmean): 2.34 cm^2. - Mitral valve: There was mild regurgitation. - Left atrium: The atrium was mildly dilated. - Right ventricle: The cavity size was mildly dilated. Wall thickness was normal.    History of Present Illness     Steve Steyer Reederis a 62 y.o.malewith a hx of coronary artery disease, ischemic cardiomyopathy, diabetes mellitus, hypertension, hyperlipidemia admitted with  UA.  Echocardiogram September 2016 showed ejection fraction 30%, grade 1 diastolic dysfunction, mild to moderate RV dysfunction and mild right atrial enlargement.  Last cardiac catheterization in October 2016 showed an occluded right coronary artery, patent stent in the first obtuse marginal, 30% circumflex, 30% second marginal, 40% proximal LAD, 60% first diagonal and 30% LAD.  Note the right filled via left-to-right collaterals.  Medical therapy recommended.  Patient has not followed up since that time.  Over the past 3 to 4 weeks he has had noticed increased dyspnea on exertion.  There is no orthopnea, PND or pedal edema.  No exertional chest pain.  At 1 AM on 5/9 he awoke with bilateral shoulder tightness followed by jaw tightness.  No chest pain.  There was some dyspnea and nausea.  Symptoms are similar but less severe compared to prior myocardial infarction pain.  He presented to the emergency room at approximately 7 AM and his symptoms was resolved.   Treated with IV heparin and admitted  for cath.   Hospital Course     Consultants: None  1.  Unstable angina - Troponin negative. Cath showed 99% pCx s/p PTCA and DES. 100% RCA CTO. Continue ASA, Brillinta, statin and BB. Ambulated well. No recurrent angina.   2. ICM/Acute systolic CHF - Echo showed LVEF of 20-25% (down from prior). Normal LVEDP. No recurrent dyspnea after IV lasix 20mg  x 1. Endorse excess  salt intake. Advised cut back. Continue BB and ACE. Add low dose Spironolactone.   3. HLD - 12/25/2017: Cholesterol 127; HDL 33; LDL Cholesterol 68; Triglycerides 128; VLDL 26  - Continue statin   4. HTN - BP stable.   The patient has been seen by Dr. Eden Emms  today and deemed ready for discharge home. All follow-up appointments have been scheduled. Discharge medications are listed below.    Discharge Vitals Blood pressure 128/83, pulse 73, temperature 97.7 F (36.5 C), resp. rate 13, height 5\' 9"  (1.753 m), weight 207 lb 3.7 oz (94 kg), SpO2 95 %.  Filed Weights   12/24/17 0746 12/25/17 0441 12/26/17 0111  Weight: 210 lb (95.3 kg) 208 lb 5.4 oz (94.5 kg) 207 lb 3.7 oz (94 kg)    Labs & Radiologic Studies     CBC Recent Labs    12/25/17 0308 12/26/17 0330  WBC 8.0 8.5  HGB 16.3 15.6  HCT 48.5 45.9  MCV 93.3 92.5  PLT 122* 120*   Basic Metabolic Panel Recent Labs    16/10/96 1224 12/25/17 0308 12/26/17 0330  NA  --  136 137  K  --  3.8 3.7  CL  --  104 105  CO2  --  22 25  GLUCOSE  --  162* 151*  BUN  --  20 14  CREATININE  --  1.01 1.08  CALCIUM  --  8.5* 8.6*  MG 1.9  --   --    Liver Function Tests Recent Labs    12/24/17 1224  AST 32  ALT 46  ALKPHOS 75  BILITOT 0.9  PROT 7.2  ALBUMIN 3.8   No results for input(s): LIPASE, AMYLASE in the last 72 hours. Cardiac Enzymes Recent Labs    12/24/17 1224 12/24/17 2019 12/25/17 0023  TROPONINI <0.03 <0.03 <0.03   Hemoglobin A1C Recent Labs    12/24/17 1224  HGBA1C 7.5*   Fasting Lipid Panel Recent Labs    12/25/17 0308  CHOL 127  HDL 33*  LDLCALC 68  TRIG 045  CHOLHDL 3.8   Thyroid Function Tests Recent Labs    12/24/17 1224  TSH 1.956    Dg Chest 2 View  Result Date: 12/24/2017 CLINICAL DATA:  Shortness of breath for 3 weeks, awoke this morning with nausea, sensation of shoulder heaviness, slight jaw pain, history of coronary disease post MI x 2, hypertension, diabetes mellitus,  former smoker EXAM: CHEST - 2 VIEW COMPARISON:  11/23/2012 FINDINGS: Enlargement of cardiac silhouette. Mediastinal contours and pulmonary vascularity normal. Slightly prominent superior mediastinal soft tissues at the thoracic inlet appears stable. Diffuse interstitial prominence in the mid to lower lungs bilaterally slightly increased since previous exam, suspect mild pulmonary edema. No pleural effusion or pneumothorax. Bones unremarkable. IMPRESSION: Enlargement of cardiac silhouette with increased interstitial markings in the mid to lower lungs increased since previous exam, question mild pulmonary edema. Electronically Signed   By: Ulyses Southward M.D.   On: 12/24/2017 08:44    Disposition   Pt is being discharged home today in good condition.  Follow-up Plans &  Appointments    Follow-up Information    Steve Bunting, MD Follow up.   Specialty:  Cardiology Why:  office will call with time and date in 2 days. Please call office if you did not heard by Tuesday afternoon  Contact information: [address] [place] [address] [phone]          Discharge Instructions    AMB Referral to Cardiac Rehabilitation - Phase II   Complete by:  As directed    Diagnosis:  Coronary Stents   Diet - low sodium heart healthy   Complete by:  As directed    Discharge instructions   Complete by:  As directed    No driving for 72 hours. No lifting over 5 lbs for 1 week. No sexual activity for 1 week. You may return to work on 12/31/17. Keep procedure site clean & dry. If you notice increased pain, swelling, bleeding or pus, call/return!  You may shower, but no soaking baths/hot tubs/pools for 1 week.   Hold Metformin, resume on Monday 12/28/17.   *Weigh yourself on the same scale at same time of day and keep a log. *Report weight gain of > 3 lbs in 1 day or 5 lbs over the course of a week and/or symptoms of excess fluid (shortness of breath, difficulty lying flat, swelling, poor  appetite, abdominal fullness/bloating, etc) to your doctor immediately. *Avoid foods that are high in sodium (processed, pre-packaged/canned goods, fast foods, etc). *Please attend all scheduled and reccommended follow up appointments   Increase activity slowly   Complete by:  As directed       Discharge Medications   Allergies as of 12/26/2017      Reactions   Lipitor [atorvastatin Calcium]    Niaspan [niacin Er]       Medication List    TAKE these medications   albuterol 108 (90 Base) MCG/ACT inhaler Commonly known as:  PROVENTIL HFA;VENTOLIN HFA Inhale 1-2 puffs into the lungs every 6 (six) hours as needed for wheezing or shortness of breath.   aspirin 81 MG tablet Take 81 mg by mouth every evening.   carvedilol 3.125 MG tablet Commonly known as:  COREG Take 1 tablet (3.125 mg total) by mouth 2 (two) times daily with a meal.   CINNAMON PO Take 1,000 mg by mouth daily.   Co Q 10 100 MG Caps Take 100 mg by mouth daily.   JARDIANCE 10 MG Tabs tablet Generic drug:  empagliflozin Take 10 mg by mouth daily.   levalbuterol 45 MCG/ACT inhaler Commonly known as:  XOPENEX HFA Inhale 1-2 puffs into the lungs every 4 (four) hours as needed for wheezing.   magnesium gluconate 500 MG tablet Commonly known as:  MAGONATE Take 1,000 mg by mouth daily.   metFORMIN 1000 MG tablet Commonly known as:  GLUCOPHAGE Take 1,000 mg by mouth 2 (two) times daily.   nitroGLYCERIN 0.4 MG SL tablet Commonly known as:  NITROSTAT Place 1 tablet (0.4 mg total) under the tongue every 5 (five) minutes as needed for chest pain.   pantoprazole 40 MG tablet Commonly known as:  PROTONIX Take 1 tablet (40 mg total) by mouth every evening.   pramipexole 1 MG tablet Commonly known as:  MIRAPEX Take 0.5 mg by mouth 4 (four) times daily.   ramipril 2.5 MG capsule Commonly known as:  ALTACE Take 1 capsule (2.5 mg  total) by mouth every  evening.   spironolactone 25 MG tablet Commonly known  as:  ALDACTONE Take 0.5 tablets (12.5 mg total) by mouth daily.   ticagrelor 90 MG Tabs tablet Commonly known as:  BRILINTA Take 1 tablet (90 mg total) by mouth 2 (two) times daily.       Outstanding Labs/Studies   BMET in 2 weeks during follow up.   Duration of Discharge Encounter   Greater than 30 minutes including physician time.  Signed, Lilya Smitherman PA-C 12/26/2017, 8:40 AM

## 2017-12-26 NOTE — Progress Notes (Signed)
CARDIAC REHAB PHASE I   PRE:  Rate/Rhythm: Sinus 82  BP:    Sitting: 129/75     SaO2: 95% Room Air  MODE:  Ambulation: 600 ft   POST:  Rate/Rhythem: 86  BP:    Sitting: 129/75     SaO2: 96% Room Air [phone] Patient ambulated independently without complaints of chest pain. Patient reported feeling "great!" Reviewed heart healthy low sodium diabetic diet with patient and wife. Reviewed heart failure booklet exercise instructions. Patient has stent card. Referral placed for phase 2 cardiac rehab. Mr Ewoldt says he does not think he will be interested as he will be busy with his garden. Discussed importance of taking Brillinta and use of sublingual nitroglycerin and when to call 911.   Thayer Headings RN BSN

## 2017-12-26 NOTE — Progress Notes (Addendum)
Progress Note  Patient Name: [name] Date of Encounter: 12/26/2017  Primary Cardiologist: Rollene Rotunda, MD   Subjective   Feeling well. No chest pain, sob or palpitations.  Dyspnea resolved with IV lasix 20 x1 yesterday. Eats lots of salt. Advised cut back.   Inpatient Medications    Scheduled Meds: . aspirin EC  81 mg Oral Daily  . carvedilol  3.125 mg Oral BID WC  . insulin aspart  0-5 Units Subcutaneous QHS  . insulin aspart  0-9 Units Subcutaneous TID WC  . magnesium gluconate  1,000 mg Oral Daily  . nitroGLYCERIN  1 inch Topical Q6H  . pantoprazole  40 mg Oral QPM  . pramipexole  0.5 mg Oral QID  . ramipril  2.5 mg Oral Daily  . sodium chloride flush  3 mL Intravenous Q12H  . ticagrelor  90 mg Oral BID   Continuous Infusions: . sodium chloride 10 mL/hr at 12/24/17 1226  . sodium chloride     PRN Meds: sodium chloride, acetaminophen, albuterol, ALPRAZolam, HYDROcodone-acetaminophen, loperamide, nitroGLYCERIN, ondansetron (ZOFRAN) IV, sodium chloride flush, zolpidem   Vital Signs    Vitals:   12/25/17 1554 12/25/17 2000 12/25/17 2135 12/26/17 0111  BP: 124/74  120/67 121/61  Pulse: 75  71 76  Resp: 19 10 (!) 22 (!) 21  Temp: 98 F (36.7 C)  (!) 97.4 F (36.3 C) 98.3 F (36.8 C)  TempSrc: Oral  Oral Oral  SpO2: 95%  94% 93%  Weight:    207 lb 3.7 oz (94 kg)  Height:        Intake/Output Summary (Last 24 hours) at 12/26/2017 0742 Last data filed at 12/26/2017 0601 Gross per 24 hour  Intake 1218 ml  Output -  Net 1218 ml   Filed Weights   12/24/17 0746 12/25/17 0441 12/26/17 0111  Weight: 210 lb (95.3 kg) 208 lb 5.4 oz (94.5 kg) 207 lb 3.7 oz (94 kg)    Telemetry    SR at rate of 70s Personally Reviewed  ECG    SR with diffuse TWI - Personally Reviewed  Physical Exam   GEN: No acute distress.   Neck: No JVD Cardiac: RRR, no murmurs, rubs, or gallops. R radial cath site stable Respiratory: Clear to auscultation bilaterally. GI:  Soft, nontender, non-distended  MS: No edema; No deformity. Neuro:  Nonfocal  Psych: Normal affect   Labs    Chemistry Recent Labs  Lab 12/24/17 0746 12/24/17 1224 12/25/17 0308 12/26/17 0330  NA 137  --  136 137  K 4.3  --  3.8 3.7  CL 105  --  104 105  CO2 20*  --  22 25  GLUCOSE 171*  --  162* 151*  BUN 20  --  20 14  CREATININE 1.00  --  1.01 1.08  CALCIUM 9.4  --  8.5* 8.6*  PROT  --  7.2  --   --   ALBUMIN  --  3.8  --   --   AST  --  32  --   --   ALT  --  46  --   --   ALKPHOS  --  75  --   --   BILITOT  --  0.9  --   --   GFRNONAA >60  --  >60 >60  GFRAA >60  --  >60 >60  ANIONGAP 12  --  10 7     Hematology Recent Labs  Lab 12/24/17 0746 12/25/17  0308 12/26/17 0330  WBC 14.6* 8.0 8.5  RBC 5.70 5.20 4.96  HGB 18.0* 16.3 15.6  HCT 52.9* 48.5 45.9  MCV 92.8 93.3 92.5  MCH 31.6 31.3 31.5  MCHC 34.0 33.6 34.0  RDW 14.1 13.9 14.1  PLT 134* 122* 120*    Cardiac Enzymes Recent Labs  Lab 12/24/17 1224 12/24/17 2019 12/25/17 0023  TROPONINI <0.03 <0.03 <0.03    Recent Labs  Lab 12/24/17 0755  TROPIPOC 0.02     BNP Recent Labs  Lab 12/24/17 0746 12/24/17 1224  BNP 250.2* 26.5     DDimer No results for input(s): DDIMER in the last 168 hours.   Radiology    Dg Chest 2 View  Result Date: 12/24/2017 CLINICAL DATA:  Shortness of breath for 3 weeks, awoke this morning with nausea, sensation of shoulder heaviness, slight jaw pain, history of coronary disease post MI x 2, hypertension, diabetes mellitus, former smoker EXAM: CHEST - 2 VIEW COMPARISON:  11/23/2012 FINDINGS: Enlargement of cardiac silhouette. Mediastinal contours and pulmonary vascularity normal. Slightly prominent superior mediastinal soft tissues at the thoracic inlet appears stable. Diffuse interstitial prominence in the mid to lower lungs bilaterally slightly increased since previous exam, suspect mild pulmonary edema. No pleural effusion or pneumothorax. Bones unremarkable.  IMPRESSION: Enlargement of cardiac silhouette with increased interstitial markings in the mid to lower lungs increased since previous exam, question mild pulmonary edema. Electronically Signed   By: Ulyses Southward M.D.   On: 12/24/2017 08:44    Cardiac Studies   CORONARY STENT INTERVENTION 12/25/17  LEFT HEART CATH AND CORONARY ANGIOGRAPHY  Conclusion     Prox RCA lesion is 99% stenosed.  Prox RCA to Mid RCA lesion is 90% stenosed.  Mid RCA lesion is 100% stenosed.  Prox LAD lesion is 40% stenosed.  Ost 1st Diag lesion is 90% stenosed.  Prox LAD to Mid LAD lesion is 10% stenosed.  Previously placed Ost 1st Mrg to 1st Mrg stent (unknown type) is widely patent.  Ost Cx to Prox Cx lesion is 90% stenosed.  Post intervention, there is a 0% residual stenosis.  A drug-eluting stent was successfully placed using a STENT SYNERGY DES 3.5X12.  Non-stenotic Prox Cx to Mid Cx lesion.  LV end diastolic pressure is normal.   1. 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 Normal LVEDP 3. Successful stenting of the proximal LCx with DES  Plan: DAPT for one year. Anticipate DC in am. Optimize CHF therapy.   Echo 12/24/17 Study Conclusions  - Left ventricle: The cavity size was mildly dilated. Systolic   function was severely reduced. The estimated ejection fraction   was in the range of 20% to 25%. Although no diagnostic regional   wall motion abnormality was identified, this possibility cannot   be completely excluded on the basis of this study. - Aortic valve: Valve area (VTI): 2.37 cm^2. Valve area (Vmax):   2.26 cm^2. Valve area (Vmean): 2.34 cm^2. - Mitral valve: There was mild regurgitation. - Left atrium: The atrium was mildly dilated. - Right ventricle: The cavity size was mildly dilated. Wall   thickness was  normal.  Patient Profile     [name] is a 62 y.o. male with a hx of coronary artery disease,  ischemic cardiomyopathy, diabetes mellitus, hypertension, hyperlipidemia  admitted with  UA.   Assessment & Plan    1. Unstable angina - Troponin negative. Cath showed 99% pCx s/p PTCA and DES. 100% RCA CTO. Continue ASA, Brillinta, statin and BB. Ambulate/  2. ICM/Acute systolic CHF - Echo showed LVEF of 20-25% (down from prior). Normal LVEDP. He felt better cath IV lasix 20mg  x 1. Endorse excess salt intake. Advised cut back. Continue BB and ACE. Add low dose Spironolactone and PRN lasix.   3. HLD - 12/25/2017: Cholesterol 127; HDL 33; LDL Cholesterol 68; Triglycerides 128; VLDL 26  - Continue statin   4. HTN - BP stable.   For questions or updates, please contact [facility] Please consult [email or web address] for contact info under Cardiology/STEMI.      SignedManson Passey, PA  12/26/2017, 7:42 AM    Patient examined chart reviewed R radial A euvolemic with clear lungs and no murmur or LE edema Would add low dose aldactone daily f/u BMET 2 weeks no need for lasix at this point if on aldactone And EDP 18 at cath  [facility]

## 2017-12-28 ENCOUNTER — Telehealth: Payer: Self-pay | Admitting: *Deleted

## 2017-12-28 NOTE — Telephone Encounter (Signed)
Patient contacted regarding discharge from [facility] on 12/26/17.  Patient understands to follow up with provider Corine Shelter, PA on 12/29/17 at 3:30 at [facility]. Patient understands discharge instructions? yes Patient understands medications and regiment? yes Patient understands to bring all medications to this visit? yes  Patient has been instructed to write down a list of his questions and bring them with him to his appointment tomorrow.

## 2017-12-29 ENCOUNTER — Encounter: Payer: Self-pay | Admitting: Cardiology

## 2017-12-29 ENCOUNTER — Telehealth (HOSPITAL_COMMUNITY): Payer: Self-pay

## 2017-12-29 ENCOUNTER — Ambulatory Visit: Payer: 59 | Admitting: Cardiology

## 2017-12-29 VITALS — BP 150/82 | HR 68 | Ht 69.0 in | Wt 208.6 lb

## 2017-12-29 DIAGNOSIS — Z9861 Coronary angioplasty status: Secondary | ICD-10-CM | POA: Diagnosis not present

## 2017-12-29 DIAGNOSIS — E119 Type 2 diabetes mellitus without complications: Secondary | ICD-10-CM

## 2017-12-29 DIAGNOSIS — G4733 Obstructive sleep apnea (adult) (pediatric): Secondary | ICD-10-CM | POA: Diagnosis not present

## 2017-12-29 DIAGNOSIS — I255 Ischemic cardiomyopathy: Secondary | ICD-10-CM | POA: Diagnosis not present

## 2017-12-29 DIAGNOSIS — I2510 Atherosclerotic heart disease of native coronary artery without angina pectoris: Secondary | ICD-10-CM

## 2017-12-29 DIAGNOSIS — I200 Unstable angina: Secondary | ICD-10-CM

## 2017-12-29 DIAGNOSIS — E785 Hyperlipidemia, unspecified: Secondary | ICD-10-CM | POA: Diagnosis not present

## 2017-12-29 DIAGNOSIS — I10 Essential (primary) hypertension: Secondary | ICD-10-CM | POA: Diagnosis not present

## 2017-12-29 MED FILL — Heparin Sod (Porcine)-NaCl IV Soln 1000 Unit/500ML-0.9%: INTRAVENOUS | Qty: 1000 | Status: AC

## 2017-12-29 NOTE — Telephone Encounter (Signed)
Attempted to call patient to see if he is interested in the Cardiac Rehab Program - lm on vm °

## 2017-12-29 NOTE — Assessment & Plan Note (Signed)
Repeat B/P 142/82

## 2017-12-29 NOTE — Assessment & Plan Note (Signed)
Statin intolerant- not interested in alternative Rx

## 2017-12-29 NOTE — Patient Instructions (Signed)
Medication Instructions:   RESTART ALDACTONE 12.5 MG ONCE DAILY  RESTART BRILINTA AND TAKE WITH CAFFEINE  Follow-Up:  Your physician recommends that you schedule a follow-up appointment in: 3 WEEKS WITH Corine Shelter PA  Your physician recommends that you schedule a follow-up appointment in: 3 MONTHS WITH DR [facility]    If you need a refill on your cardiac medications before your next appointment, please call your pharmacy.

## 2017-12-29 NOTE — Assessment & Plan Note (Signed)
H/O PCI-OM1 Sept 2014 Cath 9/14 and 10/16- medical Rx UA May 2019- cath showed patent OM stent, new pFCX disease tgreated with DES, residual small Dx1, CTO of RCA with L-R collaterals, and 40%pLAD

## 2017-12-29 NOTE — Progress Notes (Signed)
12/29/2017 [name]   11/23/1955  [id number]  Primary Physician System, Pcp Not In Primary Cardiologist: Dr Antoine Poche  HPI:  62 y/o male with a history of CAD, s/p prior OM1 PCI with DES in 2012. He was cathed in 2014 and 2016- medical Rx. He recently presented to [facility] with [place] and was restudied. This revealed a patent OM1 stent, CTO of the RCA with L-R collaterals from the CFX, 90% CFX (treated with PCI-DES), 40% pLAD and 90% small Dx1. His EF was 20-25% by echo. He is in the office today for follow up, his wife accompanied him. Unfortunately since discharge he says he has been unable to take the medications we prescribed- Coreg 3.125 mg BID, Brilinta 90 mg BID and Aldactone 12.5 mg daily. He stopped all of them yesterday. He says he became SOB and nauseated after taking them.    Current Outpatient Medications  Medication Sig Dispense Refill  . albuterol (PROVENTIL HFA;VENTOLIN HFA) 108 (90 Base) MCG/ACT inhaler Inhale 1-2 puffs into the lungs every 6 (six) hours as needed for wheezing or shortness of breath.    Marland Kitchen aspirin 81 MG tablet Take 81 mg by mouth every evening.     Marland Kitchen CINNAMON PO Take 1,000 mg by mouth daily.    . Coenzyme Q10 (CO Q 10) 100 MG CAPS Take 100 mg by mouth daily.    Marland Kitchen JARDIANCE 10 MG TABS tablet Take 10 mg by mouth daily.    . magnesium gluconate (MAGONATE) 500 MG tablet Take 1,000 mg by mouth daily.    . metFORMIN (GLUCOPHAGE) 1000 MG tablet Take 1,000 mg by mouth 2 (two) times daily.    . nitroGLYCERIN (NITROSTAT) 0.4 MG SL tablet Place 1 tablet (0.4 mg total) under the tongue every 5 (five) minutes as needed for chest pain. 30 tablet 1  . pantoprazole (PROTONIX) 40 MG tablet Take 1 tablet (40 mg total) by mouth every evening. 30 tablet 0  . pramipexole (MIRAPEX) 1 MG tablet Take 0.5 mg by mouth 4 (four) times daily.     . ramipril (ALTACE) 2.5 MG capsule Take 1 capsule (2.5 mg  total) by mouth every  evening. 30 capsule 6  . carvedilol (COREG) 3.125 MG tablet  Take 1 tablet (3.125 mg total) by mouth 2 (two) times daily with a meal. (Patient not taking: Reported on 12/29/2017) 60 tablet 11  . levalbuterol (XOPENEX HFA) 45 MCG/ACT inhaler Inhale 1-2 puffs into the lungs every 4 (four) hours as needed for wheezing. (Patient not taking: Reported on 12/29/2017) 1 Inhaler 12  . spironolactone (ALDACTONE) 25 MG tablet Take 0.5 tablets (12.5 mg total) by mouth daily. (Patient not taking: Reported on 12/29/2017) 30 tablet 3  . ticagrelor (BRILINTA) 90 MG TABS tablet Take 1 tablet (90 mg total) by mouth 2 (two) times daily. (Patient not taking: Reported on 12/29/2017) 180 tablet 3   No current facility-administered medications for this visit.     Allergies  Allergen Reactions  . Codeine Other (See Comments)    Lightheaded, vision issues  . Lipitor [Atorvastatin Calcium]   . Niaspan Odette Fraction Er]     Past Medical History:  Diagnosis Date  . ASCVD (arteriosclerotic cardiovascular disease)    a. MI 2004, did not seek care at that time. b. s/p PTCA/BMS to OM1 04/2011 (known totally occluded RCA with L-R collaterals and failed angioplasty on that vessel).  . Chronic bronchitis (HCC)   . HTN (hypertension)   . Hyperlipidemia   . LV  dysfunction    EF 45% by cath 2012  . MI (myocardial infarction) (HCC)    x2  . OSA (obstructive sleep apnea) 05/24/11  . Restless leg syndrome 06/25/2011  . Type 2 diabetes mellitus (HCC)     Social History   Socioeconomic History  . Marital status: Married    Spouse name: Not on file  . Number of children: 1  . Years of education: Not on file  . Highest education level: Not on file  Occupational History  . Occupation: Wellsite geologist   Social Needs  . Financial resource strain: Not on file  . Food insecurity:    Worry: Not on file    Inability: Not on file  . Transportation needs:    Medical: Not on file    Non-medical: Not on file  Tobacco Use  . Smoking status: Former Smoker    Packs/day: 1.50    Years: 35.00     Pack years: 52.50    Types: Cigarettes    Last attempt to quit: 08/18/2005    Years since quitting: 12.3  . Smokeless tobacco: Never Used  Substance and Sexual Activity  . Alcohol use: No  . Drug use: No  . Sexual activity: Not Currently  Lifestyle  . Physical activity:    Days per week: Not on file    Minutes per session: Not on file  . Stress: Not on file  Relationships  . Social connections:    Talks on phone: Not on file    Gets together: Not on file    Attends religious service: Not on file    Active member of club or organization: Not on file    Attends meetings of clubs or organizations: Not on file    Relationship status: Not on file  . Intimate partner violence:    Fear of current or ex partner: Not on file    Emotionally abused: Not on file    Physically abused: Not on file    Forced sexual activity: Not on file  Other Topics Concern  . Not on file  Social History Narrative   Married with 1 grown daughter who is a Diplomatic Services operational officer for a school           Family History  Problem Relation Age of Onset  . Lymphoma Mother   . Heart attack Father 33  . Heart attack Brother 51     Review of Systems: General: negative for chills, fever, night sweats or weight changes.  Cardiovascular: negative for chest pain, dyspnea on exertion, edema, orthopnea, palpitations, paroxysmal nocturnal dyspnea or shortness of breath Dermatological: negative for rash Respiratory: negative for cough or wheezing Urologic: negative for hematuria Abdominal: negative for nausea, vomiting, diarrhea, bright red blood per rectum, melena, or hematemesis Neurologic: negative for visual changes, syncope, or dizziness All other systems reviewed and are otherwise negative except as noted above.    Blood pressure (!) 150/82, pulse 68, height 5\' 9"  (1.753 m), weight 208 lb 9.6 oz (94.6 kg).  General appearance: alert, cooperative and no distress Neck: no carotid bruit and no JVD Lungs: clear to  auscultation bilaterally Heart: regular rate and rhythm Extremities: no edema Skin: Skin color, texture, turgor normal. No rashes or lesions Neurologic: Grossly normal  EKG NSR, 68, TWI AVF- old  ASSESSMENT AND PLAN:   Unstable angina (HCC) Here for a TOC follow up  CAD S/P percutaneous coronary angioplasty H/O PCI-OM1 Sept 2014 Cath 9/14 and 10/16- medical Rx UA May 2019- cath  showed patent OM stent, new pFCX disease tgreated with DES, residual small Dx1, CTO of RCA with L-R collaterals, and 40%pLAD  Cardiomyopathy, ischemic EF 20-25% 12/24/17 echo  Essential hypertension Repeat B/P 142/82  Dyslipidemia Statin intolerant- not interested in alternative Rx at this time- (His LDL was 68 on 12/25/17).   Non-insulin dependent type 2 diabetes mellitus (HCC) Diagnosed 2014- on Glucophage   PLAN  I convinced them to go back on the Brilinta, taken with caffeine. I also suggested he take his Aldactone. He'll call back in 48 hours. If he is doing well I think we should put him back on Coreg 3.125 mg BID.  I'll see him back in 3 weeks and up titrate his medications if possible, Ultimately I will try to get him on Entresto but so far he hasn't done so well with medical Rx.  I might also ask him to consider red yeast rice for his lipids. He needs an echo in 3 months then a f/u with Dr Antoine Poche.   Corine Shelter PA-C 12/29/2017 3:54 PM

## 2017-12-29 NOTE — Telephone Encounter (Signed)
Patients insurance is active and benefits verified through [facility] - $30.00 co-pay, no deductible, out of pocket amount of $6,000/$515.35 has been met, no co-insurance, and no pre-authorization is required. Passport/reference [phone]  Will contact patient to see if he is interested in the Cardiac Rehab Program. If interested, patient will be contacted for scheduling upon review by the RN Navigator.

## 2017-12-29 NOTE — Assessment & Plan Note (Signed)
EF 20-25% 12/24/17 echo

## 2017-12-29 NOTE — Assessment & Plan Note (Signed)
Diagnosed 2014- on Glucophage

## 2017-12-29 NOTE — Assessment & Plan Note (Signed)
Here for a TOC follow up

## 2017-12-30 ENCOUNTER — Other Ambulatory Visit (INDEPENDENT_AMBULATORY_CARE_PROVIDER_SITE_OTHER): Payer: 59

## 2017-12-30 DIAGNOSIS — I2510 Atherosclerotic heart disease of native coronary artery without angina pectoris: Secondary | ICD-10-CM | POA: Diagnosis not present

## 2017-12-30 DIAGNOSIS — I200 Unstable angina: Secondary | ICD-10-CM | POA: Diagnosis not present

## 2017-12-30 DIAGNOSIS — Z9861 Coronary angioplasty status: Secondary | ICD-10-CM | POA: Diagnosis not present

## 2017-12-31 ENCOUNTER — Telehealth: Payer: Self-pay | Admitting: Cardiology

## 2017-12-31 NOTE — Telephone Encounter (Signed)
New Message   Pt's wife is calling to give bp readings: 5/15: AM 161/82 hr 70 pulse 98, 4PM 13/68 hr 77   5/16: AM 124/70 hr 84  3PM 124/79 hr 85

## 2017-12-31 NOTE — Telephone Encounter (Signed)
SPOKE TO WIFE SHE STATES SHE WAS TO CALL IN READING  TO SEE IF  TO CONTINUE WITH CARVEDILOL 3.125 MG TWICE A DAY.   PER  OFFICE NOTE , IF BLOD PRESSURE WERE OKAY TO RESTART .   RN RESTART CARVEDILOL 3.125 MG TWICE A DAY  AND KEEP LOG UNTIL APPOINTMENT WITH EXTENDER- KILROY. 01/21/18     WILL SEND TO Corine Shelter PA FOR ANY FURTHER INSTRUCTIONS IF NEEDED.

## 2018-01-20 ENCOUNTER — Telehealth: Payer: Self-pay | Admitting: Cardiology

## 2018-01-20 NOTE — Telephone Encounter (Signed)
I spoke with patient. He still says he is SOB and attributes this to the Brilinta. When he presented 12/24/17 he says he wasn't taking taking Plavix or several of his other medications. I'll review with Dr Antoine Poche but I susoect the best course is to change him over to Plavix. Pt to see me tomorrow in the office.  Corine Shelter PA-C 01/20/2018 8:36 AM

## 2018-01-21 ENCOUNTER — Encounter: Payer: Self-pay | Admitting: Cardiology

## 2018-01-21 ENCOUNTER — Ambulatory Visit: Payer: 59 | Admitting: Cardiology

## 2018-01-21 VITALS — BP 138/84 | HR 71 | Ht 70.0 in | Wt 206.0 lb

## 2018-01-21 DIAGNOSIS — I10 Essential (primary) hypertension: Secondary | ICD-10-CM | POA: Diagnosis not present

## 2018-01-21 DIAGNOSIS — I2510 Atherosclerotic heart disease of native coronary artery without angina pectoris: Secondary | ICD-10-CM | POA: Diagnosis not present

## 2018-01-21 DIAGNOSIS — Z9861 Coronary angioplasty status: Secondary | ICD-10-CM | POA: Diagnosis not present

## 2018-01-21 DIAGNOSIS — E119 Type 2 diabetes mellitus without complications: Secondary | ICD-10-CM | POA: Diagnosis not present

## 2018-01-21 DIAGNOSIS — R0600 Dyspnea, unspecified: Secondary | ICD-10-CM

## 2018-01-21 DIAGNOSIS — E785 Hyperlipidemia, unspecified: Secondary | ICD-10-CM | POA: Diagnosis not present

## 2018-01-21 DIAGNOSIS — I255 Ischemic cardiomyopathy: Secondary | ICD-10-CM | POA: Diagnosis not present

## 2018-01-21 DIAGNOSIS — R0602 Shortness of breath: Secondary | ICD-10-CM | POA: Diagnosis not present

## 2018-01-21 MED ORDER — CLOPIDOGREL BISULFATE 75 MG PO TABS: ORAL_TABLET | ORAL | 0 refills | Status: DC

## 2018-01-21 MED ORDER — SACUBITRIL-VALSARTAN 24-26 MG PO TABS: 1.0000 | ORAL_TABLET | Freq: Two times a day (BID) | ORAL | 0 refills | Status: DC

## 2018-01-21 NOTE — Patient Instructions (Addendum)
Medication Instructions:  START Plavix Take 4 tabs(300mg ) tomorrow (Friday) And Starting Saturday take 1 tablet (75mg ) once a day  DISCONTINUE Brilinta AFTER TONIGHT START Entresto 24/26mg  Take 1 tablet twice a day-Samples given DISCONTINUE Ramipril   Labwork: Your physician recommends that you return for lab work in: TODAY-BMET, CBC, BMP  Testing/Procedures: NONE   Follow-Up: Your physician recommends that you schedule a follow-up appointment in: 2 Wks with Corine Shelter  Any Other Special Instructions Will Be Listed Below (If Applicable). If you need a refill on your cardiac medications before your next appointment, please call your pharmacy.

## 2018-01-21 NOTE — Addendum Note (Signed)
Addended by: Kandice Robinsons T on: 01/21/2018 02:09 PM   Modules accepted: Orders

## 2018-01-21 NOTE — Progress Notes (Addendum)
01/21/2018 [name]   [birth year]  [id number]  Primary Physician System, Pcp Not In Primary Cardiologist: Dr Antoine Poche  HPI:  62 y/o male seen today in f/u from his 12/30/17 OV. He continues to complain of SOB. He says it's worse when he bends over then stands up. Today he was trying use a pump sprayer and became quite SOB. He denies LE edema or orthopnea. He says he knows what if feels like to be volume overloaded and he doesn't think that's what it is, he attributes his symptoms to Brilinta. His weight, B/P, and HR have remained stable, his wife brought in a log book. He was also concerned about his disability, he is still waiting on his short term to kick in. He works as a Insurance account manager. At this time he cannot return to work.    Current Outpatient Medications  Medication Sig Dispense Refill  . albuterol (PROVENTIL HFA;VENTOLIN HFA) 108 (90 Base) MCG/ACT inhaler Inhale 1-2 puffs into the lungs every 6 (six) hours as needed for wheezing or shortness of breath.    Marland Kitchen aspirin 81 MG tablet Take 81 mg by mouth every evening.     . carvedilol (COREG) 3.125 MG tablet Take 1 tablet (3.125 mg total) by mouth 2 (two) times daily with a meal. 60 tablet 11  . CINNAMON PO Take 1,000 mg by mouth daily.    . Coenzyme Q10 (CO Q 10) 100 MG CAPS Take 100 mg by mouth daily.    Marland Kitchen JARDIANCE 10 MG TABS tablet Take 10 mg by mouth daily.    . magnesium gluconate (MAGONATE) 500 MG tablet Take 1,000 mg by mouth daily.    . metFORMIN (GLUCOPHAGE) 1000 MG tablet Take 1,000 mg by mouth 2 (two) times daily.    . nitroGLYCERIN (NITROSTAT) 0.4 MG SL tablet Place 1 tablet (0.4 mg total) under the tongue every 5 (five) minutes as needed for chest pain. 30 tablet 1  . pantoprazole (PROTONIX) 40 MG tablet Take 1 tablet (40 mg total) by mouth every evening. 30 tablet 0  . pramipexole (MIRAPEX) 1 MG tablet Take 0.5 mg by mouth 4 (four) times daily.     . ramipril (ALTACE) 2.5 MG capsule Take 1 capsule (2.5  mg  total) by mouth every  evening. 30 capsule 6  . spironolactone (ALDACTONE) 25 MG tablet Take 0.5 tablets (12.5 mg total) by mouth daily. 30 tablet 3  . ticagrelor (BRILINTA) 90 MG TABS tablet Take 1 tablet (90 mg total) by mouth 2 (two) times daily. 180 tablet 3  . levalbuterol (XOPENEX HFA) 45 MCG/ACT inhaler Inhale 1-2 puffs into the lungs every 4 (four) hours as needed for wheezing. (Patient not taking: Reported on 12/29/2017) 1 Inhaler 12   No current facility-administered medications for this visit.     Allergies  Allergen Reactions  . Codeine Other (See Comments)    Lightheaded, vision issues  . Lipitor [Atorvastatin Calcium]   . Niaspan Odette Fraction Er]     Past Medical History:  Diagnosis Date  . ASCVD (arteriosclerotic cardiovascular disease)    a. MI 2004, did not seek care at that time. b. s/p PTCA/BMS to OM1 04/2011 (known totally occluded RCA with L-R collaterals and failed angioplasty on that vessel).  . Chronic bronchitis (HCC)   . HTN (hypertension)   . Hyperlipidemia   . LV dysfunction    EF 45% by cath 2012  . MI (myocardial infarction) (HCC)    x2  . OSA (obstructive  sleep apnea) 05/24/11  . Restless leg syndrome 06/25/2011  . Type 2 diabetes mellitus (HCC)     Social History   Socioeconomic History  . Marital status: Married    Spouse name: Not on file  . Number of children: 1  . Years of education: Not on file  . Highest education level: Not on file  Occupational History  . Occupation: Wellsite geologist   Social Needs  . Financial resource strain: Not on file  . Food insecurity:    Worry: Not on file    Inability: Not on file  . Transportation needs:    Medical: Not on file    Non-medical: Not on file  Tobacco Use  . Smoking status: Former Smoker    Packs/day: 1.50    Years: 35.00    Pack years: 52.50    Types: Cigarettes    Last attempt to quit: 08/18/2005    Years since quitting: 12.4  . Smokeless tobacco: Never Used  Substance and Sexual  Activity  . Alcohol use: No  . Drug use: No  . Sexual activity: Not Currently  Lifestyle  . Physical activity:    Days per week: Not on file    Minutes per session: Not on file  . Stress: Not on file  Relationships  . Social connections:    Talks on phone: Not on file    Gets together: Not on file    Attends religious service: Not on file    Active member of club or organization: Not on file    Attends meetings of clubs or organizations: Not on file    Relationship status: Not on file  . Intimate partner violence:    Fear of current or ex partner: Not on file    Emotionally abused: Not on file    Physically abused: Not on file    Forced sexual activity: Not on file  Other Topics Concern  . Not on file  Social History Narrative   Married with 1 grown daughter who is a Diplomatic Services operational officer for a school           Family History  Problem Relation Age of Onset  . Lymphoma Mother   . Heart attack Father 21  . Heart attack Brother 51     Review of Systems: General: negative for chills, fever, night sweats or weight changes.  Cardiovascular: negative for chest pain, dyspnea on exertion, edema, orthopnea, palpitations, paroxysmal nocturnal dyspnea or shortness of breath Dermatological: negative for rash Respiratory: negative for cough or wheezing Urologic: negative for hematuria Abdominal: negative for nausea, vomiting, diarrhea, bright red blood per rectum, melena, or hematemesis Neurologic: negative for visual changes, syncope, or dizziness All other systems reviewed and are otherwise negative except as noted above.    Blood pressure 138/84, pulse 71, height 5\' 10"  (1.778 m), weight 206 lb (93.4 kg).  General appearance: alert, cooperative and no distress Neck: no carotid bruit and no JVD Lungs: fine crackles at bases Heart: regular rate and rhythm Extremities: extremities normal, atraumatic, no cyanosis or edema Skin: Skin color, texture, turgor normal. No rashes or  lesions Neurologic: Grossly normal   ASSESSMENT AND PLAN:    Dyspnea- I'm assuming this is secondary to Brilinta. He may have a component of CHF or Low out put as well. His BNP was 26 on May 9th and he does not appear to have CHF on exam..  Recheck labs-CBC, BMP, BNP.   CAD S/P percutaneous coronary angioplasty H/O PCI-OM1 Sept 2014 Cath  9/14 and 10/16- medical Rx UA May 2019- cath showed patent OM stent, new pFCX disease tgreated with DES, residual small Dx1, CTO of RCA with L-R collaterals, and 40%pLAD  Cardiomyopathy, ischemic EF 20-25% 12/24/17 echo  Essential hypertension His B/P had been low. I will add Entresto today with plans to increase in two weeks.   Dyslipidemia Statin intolerant- not interested in alternative Rx at this time- (His LDL was 68 on 12/25/17).   Non-insulin dependent type 2 diabetes mellitus (HCC) Diagnosed 2014- on Glucophage   PLAN  Stop Brilinta after this PM dose. Start Plavix 300 mg tomorrow, then 75 mg daily. Check labs- CBC, BNP, BMP. Stop Ramipril and start Entresto low dose in 48 hours with plans to increase this in two weeks.   Corine Shelter PA-C 01/21/2018 1:45 PM

## 2018-01-22 ENCOUNTER — Encounter: Payer: Self-pay | Admitting: Cardiology

## 2018-01-22 ENCOUNTER — Telehealth (HOSPITAL_COMMUNITY): Payer: Self-pay

## 2018-01-22 DIAGNOSIS — D696 Thrombocytopenia, unspecified: Secondary | ICD-10-CM | POA: Insufficient documentation

## 2018-01-22 LAB — BASIC METABOLIC PANEL
BUN/Creatinine Ratio: 19 (ref 10–24)
BUN: 16 mg/dL (ref 8–27)
CO2: 19 mmol/L — ABNORMAL LOW (ref 20–29)
Calcium: 9.5 mg/dL (ref 8.6–10.2)
Chloride: 102 mmol/L (ref 96–106)
Creatinine, Ser: 0.86 mg/dL (ref 0.76–1.27)
GFR calc Af Amer: 107 mL/min/{1.73_m2} (ref >59)
GFR calc non Af Amer: 93 mL/min/{1.73_m2} (ref >59)
Glucose: 124 mg/dL — ABNORMAL HIGH (ref 65–99)
Potassium: 4.1 mmol/L (ref 3.5–5.2)
Sodium: 138 mmol/L (ref 134–144)

## 2018-01-22 LAB — CBC
Hematocrit: 47.4 % (ref 37.5–51.0)
Hemoglobin: 16.2 g/dL (ref 13.0–17.7)
MCH: 31.2 pg (ref 26.6–33.0)
MCHC: 34.2 g/dL (ref 31.5–35.7)
MCV: 91 fL (ref 79–97)
Platelets: 121 10*3/uL — ABNORMAL LOW (ref 150–450)
RBC: 5.19 x10E6/uL (ref 4.14–5.80)
RDW: 15 % (ref 12.3–15.4)
WBC: 8.5 10*3/uL (ref 3.4–10.8)

## 2018-01-22 LAB — PRO B NATRIURETIC PEPTIDE: NT-Pro BNP: 39 pg/mL (ref 0–210)

## 2018-01-22 NOTE — Telephone Encounter (Signed)
Attempted to call patient in regards to Cardiac Rehab - lm on vm °

## 2018-02-03 ENCOUNTER — Telehealth (HOSPITAL_COMMUNITY): Payer: Self-pay

## 2018-02-03 NOTE — Telephone Encounter (Signed)
Called pt to see if he would be interetsed in participate in the Cardiac Rehab Program, pt decline. States he has a lot of things to do around the house.  Closed referral.

## 2018-02-05 ENCOUNTER — Ambulatory Visit (INDEPENDENT_AMBULATORY_CARE_PROVIDER_SITE_OTHER): Payer: 59 | Admitting: Cardiology

## 2018-02-05 ENCOUNTER — Encounter: Payer: Self-pay | Admitting: Cardiology

## 2018-02-05 VITALS — BP 112/68 | HR 59 | Ht 70.0 in | Wt 209.0 lb

## 2018-02-05 DIAGNOSIS — E785 Hyperlipidemia, unspecified: Secondary | ICD-10-CM

## 2018-02-05 DIAGNOSIS — I10 Essential (primary) hypertension: Secondary | ICD-10-CM

## 2018-02-05 DIAGNOSIS — I2510 Atherosclerotic heart disease of native coronary artery without angina pectoris: Secondary | ICD-10-CM | POA: Diagnosis not present

## 2018-02-05 DIAGNOSIS — Z9861 Coronary angioplasty status: Secondary | ICD-10-CM | POA: Diagnosis not present

## 2018-02-05 DIAGNOSIS — I255 Ischemic cardiomyopathy: Secondary | ICD-10-CM

## 2018-02-05 DIAGNOSIS — E119 Type 2 diabetes mellitus without complications: Secondary | ICD-10-CM | POA: Diagnosis not present

## 2018-02-05 MED ORDER — SACUBITRIL-VALSARTAN 24-26 MG PO TABS: 1.0000 | ORAL_TABLET | Freq: Two times a day (BID) | ORAL | 0 refills | Status: DC

## 2018-02-05 NOTE — Patient Instructions (Addendum)
Medication Instructions: Your physician recommends that you continue on your current medications as directed. Please refer to the Current Medication list given to you today.  If you need a refill on your cardiac medications before your next appointment, please call your pharmacy.    Procedures/Testing: Your physician has requested that you have an echocardiogram in August before your appointment on August 14th with Dr.Hochrein. Echocardiography is a painless test that uses sound waves to create images of your heart. It provides your doctor with information about the size and shape of your heart and how well your heart's chambers and valves are working. This procedure takes approximately one hour. There are no restrictions for this procedure. This will take place at [address], [address].   Follow-Up: Your physician wants you to keep your appointment with Dr. Antoine Poche in August after your echo.   Thank you for choosing Heart[facility] at [facility]!!

## 2018-02-05 NOTE — Progress Notes (Signed)
02/05/2018 [name]   1955-11-29  [id number]  Primary Physician System, Pcp Not In Primary Cardiologist: Dr Antoine Poche  HPI: 62 y/o male with a history of CAD, s/p prior OM1 PCI with DES in 2012. He was cathed in 2014 and 2016- medical Rx. He presented to [facility] 12/24/17 with [place] and was restudied. This revealed a patent OM1 stent, CTO of the RCA with L-R collaterals from the CFX, 90% CFX (treated with PCI-DES), 40% pLAD and 90% small Dx1. His EF was 20-25% by echo. He was seen in the office 12/29/17 for follow up and complained of dyspnea. His B/P has been low limiting medical Rx. The pt felt so bad he stopped all his medication before that visit. When I saw him 5/14 I convinced him to go back on Brilinta and Aldactone, and 48 hours later he resumed Coreg 3/125 mg BID.  I saw him in follow up 01/21/18 and at that time we stopped his Brilinta and started him on Plavix with a loading dose. Labs then showed no anemia, renal insufficiency or CHF by BNP. On 01/21/18 his Ramipril 2.5 mg was stopped and he was placed on low dose Entresto. I'm seeing back in follow up today. His dyspnea improved off Brilinta. He denies any orthopnea or LE edema. He has not had syncope or pre syncope. His initial B/P today was solid- 112/68- but repeat by me at the end of the visit showed his B/P to be borderline low- 100/60 in both arms.    Current Outpatient Medications  Medication Sig Dispense Refill  . albuterol (PROVENTIL HFA;VENTOLIN HFA) 108 (90 Base) MCG/ACT inhaler Inhale 1-2 puffs into the lungs every 6 (six) hours as needed for wheezing or shortness of breath.    Marland Kitchen aspirin 81 MG tablet Take 81 mg by mouth every evening.     . carvedilol (COREG) 3.125 MG tablet Take 1 tablet (3.125 mg total) by mouth 2 (two) times daily with a meal. 60 tablet 11  . CINNAMON PO Take 1,000 mg by mouth daily.    . clopidogrel (PLAVIX) 75 MG tablet Take 4 tabs for the first dose then take 1 tab once a day 34 tablet 0  . Coenzyme Q10  (CO Q 10) 100 MG CAPS Take 100 mg by mouth daily.    Marland Kitchen JARDIANCE 10 MG TABS tablet Take 10 mg by mouth daily.    . magnesium gluconate (MAGONATE) 500 MG tablet Take 1,000 mg by mouth daily.    . metFORMIN (GLUCOPHAGE) 1000 MG tablet Take 1,000 mg by mouth 2 (two) times daily.    . nitroGLYCERIN (NITROSTAT) 0.4 MG SL tablet Place 1 tablet (0.4 mg total) under the tongue every 5 (five) minutes as needed for chest pain. 30 tablet 1  . pantoprazole (PROTONIX) 40 MG tablet Take 1 tablet (40 mg total) by mouth every evening. 30 tablet 0  . pramipexole (MIRAPEX) 1 MG tablet Take 0.5 mg by mouth 4 (four) times daily.     . sacubitril-valsartan (ENTRESTO) 24-26 MG Take 1 tablet by mouth 2 (two) times daily. 60 tablet 0  . spironolactone (ALDACTONE) 25 MG tablet Take 0.5 tablets (12.5 mg total) by mouth daily. 30 tablet 3   No current facility-administered medications for this visit.     Allergies  Allergen Reactions  . Brilinta [Ticagrelor] Other (See Comments)    Intolerable dyspnea-changed to Plavix  . Codeine Other (See Comments)    Lightheaded, vision issues  . Lipitor [Atorvastatin Calcium]   .  Niaspan Odette Fraction Er]     Past Medical History:  Diagnosis Date  . ASCVD (arteriosclerotic cardiovascular disease)    a. MI 2004, did not seek care at that time. b. s/p PTCA/BMS to OM1 04/2011 (known totally occluded RCA with L-R collaterals and failed angioplasty on that vessel).  . Chronic bronchitis (HCC)   . HTN (hypertension)   . Hyperlipidemia   . LV dysfunction    EF 45% by cath 2012  . MI (myocardial infarction) (HCC)    x2  . OSA (obstructive sleep apnea) 05/24/11  . Restless leg syndrome 06/25/2011  . Type 2 diabetes mellitus (HCC)     Social History   Socioeconomic History  . Marital status: Married    Spouse name: Not on file  . Number of children: 1  . Years of education: Not on file  . Highest education level: Not on file  Occupational History  . Occupation: Medical illustrator   Social Needs  . Financial resource strain: Not on file  . Food insecurity:    Worry: Not on file    Inability: Not on file  . Transportation needs:    Medical: Not on file    Non-medical: Not on file  Tobacco Use  . Smoking status: Former Smoker    Packs/day: 1.50    Years: 35.00    Pack years: 52.50    Types: Cigarettes    Last attempt to quit: 08/18/2005    Years since quitting: 12.4  . Smokeless tobacco: Never Used  Substance and Sexual Activity  . Alcohol use: No  . Drug use: No  . Sexual activity: Not Currently  Lifestyle  . Physical activity:    Days per week: Not on file    Minutes per session: Not on file  . Stress: Not on file  Relationships  . Social connections:    Talks on phone: Not on file    Gets together: Not on file    Attends religious service: Not on file    Active member of club or organization: Not on file    Attends meetings of clubs or organizations: Not on file    Relationship status: Not on file  . Intimate partner violence:    Fear of current or ex partner: Not on file    Emotionally abused: Not on file    Physically abused: Not on file    Forced sexual activity: Not on file  Other Topics Concern  . Not on file  Social History Narrative   Married with 1 grown daughter who is a Diplomatic Services operational officer for a school           Family History  Problem Relation Age of Onset  . Lymphoma Mother   . Heart attack Father 34  . Heart attack Brother 51     Review of Systems: General: negative for chills, fever, night sweats or weight changes.  Cardiovascular: negative for chest pain, dyspnea on exertion, edema, orthopnea, palpitations, paroxysmal nocturnal dyspnea or shortness of breath Dermatological: negative for rash Respiratory: negative for cough or wheezing Urologic: negative for hematuria Abdominal: negative for nausea, vomiting, diarrhea, bright red blood per rectum, melena, or hematemesis Neurologic: negative for visual changes, syncope,  or dizziness All other systems reviewed and are otherwise negative except as noted above.    Blood pressure 112/68, pulse (!) 59, height 5\' 10"  (1.778 m), weight 209 lb (94.8 kg).  General appearance: alert, cooperative and no distress Neck: no carotid bruit and no JVD Lungs:  clear to auscultation bilaterally Heart: regular rate and rhythm Extremities: no edema Skin: Skin color, texture, turgor normal. No rashes or lesions Neurologic: Grossly normal  EKG NSR, -59- inferior Qs  ASSESSMENT AND PLAN:   Dyspnea- Secondary to Brilinta- his symptoms have improved since he was switched to Plavix.   CAD S/P percutaneous coronary angioplasty H/O PCI-OM1 Sept 2014 Cath 9/14 and 10/16- medical Rx UA May 2019- cath showed patent OM stent, new pFCX disease tgreated with DES, residual small Dx1, CTO of RCA with L-R collaterals, and 40%pLAD  Cardiomyopathy, ischemic EF 20-25% 12/24/17 echo   Essential hypertension His B/P has been low.  I added Entresto LOV with plans to increase in two weeks, but repeat B/P by me- 100/62 so I did not change this.    Dyslipidemia Statin intolerant- not interested in alternative Rxat this time- (His LDL was 68 on 12/25/17).  Non-insulin dependent type 2 diabetes mellitus (HCC) Diagnosed 2014- on Glucophage   PLAN  Same Rx- check an echo in Aug and f/u with Dr Antoine Poche after. Consider adding Jardiance at some point. Of note the pt tells me he has retired.   Corine Shelter PA-C 02/05/2018 2:54 PM

## 2018-02-10 ENCOUNTER — Telehealth: Payer: Self-pay

## 2018-02-10 NOTE — Telephone Encounter (Signed)
Spoke with patients wife, per dpr, to inform her that I gave pharmacy discount code over phone so patient could get free 30 day supply of Entresto. She voiced understanding and stated she makes his pill tray and had not taken it to the pharmacy as of yet. I informed her that we recieved a prior authorization for the med but I was not going to complete it incase Dr Antoine Poche wanted to increase the med and that I would place a 14 day sample upfront for patient. She voiced understanding and stated that Franky Macho said he was not going to increase the Entresto b/c the patients blood pressure was low. Per luke wait until patient is seen by Dr Antoine Poche before doing the pa incase he does increase the medication.

## 2018-02-10 NOTE — Telephone Encounter (Deleted)
Wife informed that

## 2018-02-15 ENCOUNTER — Other Ambulatory Visit: Payer: Self-pay | Admitting: Cardiology

## 2018-02-15 NOTE — Telephone Encounter (Signed)
Rx(s) sent to pharmacy electronically.  

## 2018-02-17 ENCOUNTER — Telehealth: Payer: Self-pay | Admitting: Cardiology

## 2018-02-17 NOTE — Telephone Encounter (Signed)
Returned call to patient who is calling to f/up on status of [employer] who needs his short term disability paperwork completed. Advised would route to medical records pool for assistance

## 2018-02-17 NOTE — Telephone Encounter (Signed)
New Message    Patient is calling because he was told that [facility] faxed a form over for the provider to sign. He says they wanted him to call to see if the form has been received. Please call to discuss.

## 2018-02-23 DIAGNOSIS — I10 Essential (primary) hypertension: Secondary | ICD-10-CM | POA: Diagnosis not present

## 2018-02-23 DIAGNOSIS — E119 Type 2 diabetes mellitus without complications: Secondary | ICD-10-CM | POA: Diagnosis not present

## 2018-02-23 DIAGNOSIS — E785 Hyperlipidemia, unspecified: Secondary | ICD-10-CM | POA: Diagnosis not present

## 2018-03-02 NOTE — Telephone Encounter (Signed)
Returned call to patient's wife. She states that Dr. Antoine Poche said that patient was permanently disabled. She states patient has short-term and long-term disability thru his work and this paperwork is to help him obtain permanent disability.  The paperwork should be from [employer]. Wife is unsure if it was mailed or faxed but was sent within the last week.   Routed to [employer]

## 2018-03-02 NOTE — Telephone Encounter (Signed)
Follow up   Pt's wife is calling, states that some more paper work is being sent for the doctor to sign because they need more information. Please call

## 2018-03-15 ENCOUNTER — Ambulatory Visit (HOSPITAL_COMMUNITY): Payer: 59 | Attending: Internal Medicine

## 2018-03-15 ENCOUNTER — Other Ambulatory Visit: Payer: Self-pay

## 2018-03-15 ENCOUNTER — Other Ambulatory Visit: Payer: Self-pay | Admitting: Cardiology

## 2018-03-15 DIAGNOSIS — I509 Heart failure, unspecified: Secondary | ICD-10-CM | POA: Insufficient documentation

## 2018-03-15 DIAGNOSIS — I255 Ischemic cardiomyopathy: Secondary | ICD-10-CM

## 2018-03-15 DIAGNOSIS — E119 Type 2 diabetes mellitus without complications: Secondary | ICD-10-CM | POA: Insufficient documentation

## 2018-03-15 DIAGNOSIS — Z87891 Personal history of nicotine dependence: Secondary | ICD-10-CM | POA: Diagnosis not present

## 2018-03-15 DIAGNOSIS — I110 Hypertensive heart disease with heart failure: Secondary | ICD-10-CM | POA: Diagnosis not present

## 2018-03-15 DIAGNOSIS — M7661 Achilles tendinitis, right leg: Secondary | ICD-10-CM | POA: Diagnosis not present

## 2018-03-15 DIAGNOSIS — R001 Bradycardia, unspecified: Secondary | ICD-10-CM | POA: Insufficient documentation

## 2018-03-15 DIAGNOSIS — G4733 Obstructive sleep apnea (adult) (pediatric): Secondary | ICD-10-CM | POA: Insufficient documentation

## 2018-03-15 DIAGNOSIS — E785 Hyperlipidemia, unspecified: Secondary | ICD-10-CM | POA: Insufficient documentation

## 2018-03-15 DIAGNOSIS — I2510 Atherosclerotic heart disease of native coronary artery without angina pectoris: Secondary | ICD-10-CM | POA: Diagnosis not present

## 2018-03-15 NOTE — Telephone Encounter (Signed)
Rx(s) sent to pharmacy electronically.  

## 2018-03-30 NOTE — Progress Notes (Signed)
Cardiology Office Note   Date:  03/31/2018   ID:  [name], DOB [birth year], MRN [id number]  PCP:  [facility]  Cardiologist:   Rollene Rotunda, MD   Chief Complaint  Patient presents with  . Coronary Artery Disease      History of Present Illness: [name] is a 62 y.o. male who presents for follow up of CAD.  He is s/p prior OM1 PCI with DES in 2012. He was cathed in 2014 and 2016 and was treated with medical Rx. He presented to [facility] 12/24/17 with [place] and was restudied. This revealed a patent OM1 stent, CTO of the RCA with L-R collaterals from the CFX, 90% CFX (treated with PCI-DES), 40% pLAD and 90% small Dx1. His EF was 20-25% by echo.   He has been seen for med titration which has been difficult with his low BP.   Follow up EF has been low but improved at 30%.    He continues to have dyspnea with moderate exertion but is not having any resting shortness of breath, PND or orthopnea.  Is not describing palpitations, presyncope or syncope although he has some orthostatic changes.  He is very careful about changing positions.  We have been unable to titrate meds because of his low blood pressure.  He eats too much salt and we talked about this at length today.  Past Medical History:  Diagnosis Date  . ASCVD (arteriosclerotic cardiovascular disease)    a. MI 2004, did not seek care at that time. b. s/p PTCA/BMS to OM1 04/2011 (known totally occluded RCA with L-R collaterals and failed angioplasty on that vessel). c.)  Cath 2019 chronically occluded right coronary artery with collateralization.  90% circumflex stenosis treated with a DES.  Marland Kitchen Chronic bronchitis (HCC)   . HTN (hypertension)   . Hyperlipidemia   . Ischemic cardiomyopathy    EF 30%.    . OSA (obstructive sleep apnea) 05/24/11  . Restless leg syndrome 06/25/2011  . Type 2 diabetes mellitus (HCC)     Past Surgical History:  Procedure Laterality Date  . CARDIAC CATHETERIZATION  11/24/2012   40-50% ostial LAD, 40% mid LAD, 60-70% ostial D1, 40% proximal LCx, patent OM branch stent, 99% serial proximal-mid RCA lesions, 100% mid RCA occlusion (chronic with left to right collateralization); EF 40%, inferior wall hypokinesis.  Marland Kitchen CARDIAC CATHETERIZATION N/A 05/31/2015   Procedure: Left Heart Cath and Coronary Angiography;  Surgeon: Kathleene Hazel, MD;  Location: [facility] INVASIVE CV LAB;  Service: Cardiovascular;  Laterality: N/A;  . CORONARY STENT INTERVENTION N/A 12/25/2017   Procedure: CORONARY STENT INTERVENTION;  Surgeon: Swaziland, Peter M, MD;  Location: [facility] INVASIVE CV LAB;  Service: Cardiovascular;  Laterality: N/A;  . CORONARY STENT PLACEMENT  04/2011  . CYSTECTOMY     Spine and jaw  . LEFT HEART CATH AND CORONARY ANGIOGRAPHY N/A 12/25/2017   Procedure: LEFT HEART CATH AND CORONARY ANGIOGRAPHY;  Surgeon: Swaziland, Peter M, MD;  Location: [facility] INVASIVE CV LAB;  Service: Cardiovascular;  Laterality: N/A;  . LEFT HEART CATHETERIZATION WITH CORONARY ANGIOGRAM N/A 11/24/2012   Procedure: LEFT HEART CATHETERIZATION WITH CORONARY ANGIOGRAM;  Surgeon: Kathleene Hazel, MD;  Location: [facility] CATH LAB;  Service: Cardiovascular;  Laterality: N/A;  . lesion removed from foot     right foot  . TONSILLECTOMY AND ADENOIDECTOMY       Current Outpatient Medications  Medication Sig Dispense Refill  . albuterol (PROVENTIL HFA;VENTOLIN HFA) 108 (90  Base) MCG/ACT inhaler Inhale 1-2 puffs into the lungs every 6 (six) hours as needed for wheezing or shortness of breath.    Marland Kitchen aspirin 81 MG tablet Take 81 mg by mouth every evening.     . carvedilol (COREG) 3.125 MG tablet Take 1 tablet (3.125 mg total) by mouth 2 (two) times daily with a meal. 90 tablet 3  . CINNAMON PO Take 1,000 mg by mouth daily.    . clopidogrel (PLAVIX) 75 MG tablet Take 1 tablet (75 mg total) by mouth daily. 90 tablet 3  . Coenzyme Q10 (CO Q 10) 100 MG CAPS Take 100 mg by mouth daily.    Marland Kitchen JARDIANCE 10 MG TABS tablet Take 10 mg by  mouth daily.    . magnesium gluconate (MAGONATE) 500 MG tablet Take 1,000 mg by mouth daily.    . metFORMIN (GLUCOPHAGE) 1000 MG tablet Take 1,000 mg by mouth 2 (two) times daily.    . nitroGLYCERIN (NITROSTAT) 0.4 MG SL tablet Place 1 tablet (0.4 mg total) under the tongue every 5 (five) minutes as needed for chest pain. 30 tablet 1  . pantoprazole (PROTONIX) 40 MG tablet Take 1 tablet (40 mg total) by mouth every evening. 30 tablet 0  . pramipexole (MIRAPEX) 1 MG tablet Take 0.5 mg by mouth 4 (four) times daily.     . sacubitril-valsartan (ENTRESTO) 24-26 MG Take 1 tablet by mouth 2 (two) times daily. 180 tablet 3  . spironolactone (ALDACTONE) 25 MG tablet Take 0.5 tablets (12.5 mg total) by mouth daily. 45 tablet 3  . VOLTAREN 1 % GEL APP 2 GRAMS EXT AA 3 TO 4 XD  1   No current facility-administered medications for this visit.     Allergies:   Brilinta [ticagrelor]; Codeine; Lipitor [atorvastatin calcium]; and Niaspan [niacin er]    ROS:  Please see the history of present illness.   Otherwise, review of systems are positive for none.   All other systems are reviewed and negative.    PHYSICAL EXAM: VS:  BP 119/78   Pulse 64   Ht 5\' 10"  (1.778 m)   Wt 210 lb 6.4 oz (95.4 kg)   BMI 30.19 kg/m  , BMI Body mass index is 30.19 kg/m. GENERAL:  Well appearing NECK:  No jugular venous distention, waveform within normal limits, carotid upstroke brisk and symmetric, no bruits, no thyromegaly LUNGS:  Clear to auscultation bilaterally BACK:  No CVA tenderness CHEST:  Unremarkable HEART:  PMI not displaced or sustained,S1 and S2 within normal limits, no S3, no S4, no clicks, no rubs, no murmurs ABD:  Flat, positive bowel sounds normal in frequency in pitch, no bruits, no rebound, no guarding, no midline pulsatile mass, no hepatomegaly, no splenomegaly EXT:  2 plus pulses throughout, no edema, no cyanosis no clubbing    EKG:  EKG is not ordered today.    Recent Labs: 12/24/2017: ALT 46;  B Natriuretic Peptide 26.5; Magnesium 1.9; TSH 1.956 01/21/2018: BUN 16; Creatinine, Ser 0.86; Hemoglobin 16.2; NT-Pro BNP 39; Platelets 121; Potassium 4.1; Sodium 138    Lipid Panel    Component Value Date/Time   CHOL 127 12/25/2017 0308   TRIG 128 12/25/2017 0308   HDL 33 (L) 12/25/2017 0308   CHOLHDL 3.8 12/25/2017 0308   VLDL 26 12/25/2017 0308   LDLCALC 68 12/25/2017 0308      Wt Readings from Last 3 Encounters:  03/31/18 210 lb 6.4 oz (95.4 kg)  02/05/18 209 lb (94.8 kg)  01/21/18  206 lb (93.4 kg)      Other studies Reviewed: Additional studies/ records that were reviewed today include: Labs. Review of the above records demonstrates:  Please see elsewhere in the note.     ASSESSMENT AND PLAN:  Dyspnea- He was switched to Plavix.   His breathing is as described above.  He is not having any resting symptoms.  I am unable to titrate meds.  No change in therapy is indicated.   CAD S/P percutaneous coronary angioplasty He has no ongoing chest discomfort.  He will continue with risk reduction.  Cardiomyopathy, ischemic EF 20-25% 12/24/17 echo now up to 30%.  I am unable to titrate meds further.  He would consider an ICD.  I will refer him for electrophysiology evaluation.  Essential hypertension His B/P has been low.   I am unable to titrate meds.  No change in therapy.e this.   Dyslipidemia Statin intolerant- not interested in alternative Rxat this time.     Non-insulin dependent type 2 diabetes mellitus (HCC) A1C was 7.5.  No change in therapy.   Current medicines are reviewed at length with the patient today.  The patient does not have concerns regarding medicines.  The following changes have been made:  no change  Labs/ tests ordered today include: None  Orders Placed This Encounter  Procedures  . Ambulatory referral to Cardiac Electrophysiology     Disposition:   FU with me after EP evaluation.      Signed, Rollene Rotunda, MD  03/31/2018 3:04 PM     [facility]

## 2018-03-31 ENCOUNTER — Ambulatory Visit: Payer: 59 | Admitting: Cardiology

## 2018-03-31 ENCOUNTER — Encounter: Payer: Self-pay | Admitting: Cardiology

## 2018-03-31 VITALS — BP 119/78 | HR 64 | Ht 70.0 in | Wt 210.4 lb

## 2018-03-31 DIAGNOSIS — I2510 Atherosclerotic heart disease of native coronary artery without angina pectoris: Secondary | ICD-10-CM | POA: Diagnosis not present

## 2018-03-31 DIAGNOSIS — E118 Type 2 diabetes mellitus with unspecified complications: Secondary | ICD-10-CM | POA: Diagnosis not present

## 2018-03-31 DIAGNOSIS — I255 Ischemic cardiomyopathy: Secondary | ICD-10-CM | POA: Diagnosis not present

## 2018-03-31 MED ORDER — CARVEDILOL 3.125 MG PO TABS: 3.1250 mg | ORAL_TABLET | Freq: Two times a day (BID) | ORAL | 3 refills | Status: DC

## 2018-03-31 MED ORDER — SPIRONOLACTONE 25 MG PO TABS: 12.5000 mg | ORAL_TABLET | Freq: Every day | ORAL | 3 refills | Status: DC

## 2018-03-31 MED ORDER — SACUBITRIL-VALSARTAN 24-26 MG PO TABS: 1.0000 | ORAL_TABLET | Freq: Two times a day (BID) | ORAL | 3 refills | Status: DC

## 2018-03-31 MED ORDER — SACUBITRIL-VALSARTAN 24-26 MG PO TABS: 1.0000 | ORAL_TABLET | Freq: Two times a day (BID) | ORAL | 11 refills | Status: DC

## 2018-03-31 MED ORDER — CLOPIDOGREL BISULFATE 75 MG PO TABS: 75.0000 mg | ORAL_TABLET | Freq: Every day | ORAL | 3 refills | Status: DC

## 2018-03-31 NOTE — Patient Instructions (Signed)
Medication Instructions:  Continue Current medications  If you need a refill on your cardiac medications before your next appointment, please call your pharmacy.  Labwork: None Ordered   Testing/Procedures: None Ordered   Follow-Up: You have been referred to Dr Elberta Fortis     Thank you for choosing [facility] at [facility]!!

## 2018-04-08 ENCOUNTER — Telehealth: Payer: Self-pay | Admitting: Cardiology

## 2018-04-08 NOTE — Telephone Encounter (Signed)
New message  Patient's wife states that need prior authorization for sacubitril-valsartan (ENTRESTO) 24-26 MG.  Please advise.

## 2018-04-08 NOTE — Telephone Encounter (Signed)
Routed to primary to initiate PA

## 2018-04-09 NOTE — Telephone Encounter (Signed)
Sherryll Burger was approved until 04/10/2019

## 2018-04-09 NOTE — Telephone Encounter (Signed)
Prior Auth request send into covermymed will await responds

## 2018-04-15 ENCOUNTER — Telehealth: Payer: Self-pay | Admitting: Cardiology

## 2018-04-15 NOTE — Telephone Encounter (Signed)
Spoke with pt's wife.  She sts that the pt's mail order pharmacy script for [facility] 24/26mg  will not be delivered until next week Tues.   The pt received new start samples that will be completed tomorrow. The pt will be without the medication for 3 days and is not able to afford purchasing it out of pocket at his local pharmacy. Pt wife ask if we could provide samples to cover the pt until his mail order script is received. Adv her that I will check our sample supply and call her back.  Pt wife voiced appreciation.   Pt wife aware that the samples of Sherryll Burger will be left at the front desk for pick up. She voiced appreciation for the assistance.

## 2018-04-15 NOTE — Telephone Encounter (Signed)
New Message:    Patient calling the office for samples of medication:   1.  What medication and dosage are you requesting samples for?  sacubitril-valsartan (ENTRESTO) 24-26 MG  2.  Are you currently out of this medication? No   The medication will not ready until the end of next week

## 2018-04-26 ENCOUNTER — Encounter: Payer: Self-pay | Admitting: Cardiology

## 2018-04-27 ENCOUNTER — Ambulatory Visit: Payer: 59 | Admitting: Cardiology

## 2018-04-27 ENCOUNTER — Encounter: Payer: Self-pay | Admitting: Cardiology

## 2018-04-27 VITALS — BP 130/80 | HR 66 | Ht 70.0 in | Wt 211.0 lb

## 2018-04-27 DIAGNOSIS — I10 Essential (primary) hypertension: Secondary | ICD-10-CM

## 2018-04-27 DIAGNOSIS — I255 Ischemic cardiomyopathy: Secondary | ICD-10-CM

## 2018-04-27 DIAGNOSIS — I2510 Atherosclerotic heart disease of native coronary artery without angina pectoris: Secondary | ICD-10-CM | POA: Diagnosis not present

## 2018-04-27 NOTE — Patient Instructions (Signed)
Medication Instructions:  Your physician recommends that you continue on your current medications as directed. Please refer to the Current Medication list given to you today.     * If you need a refill on your cardiac medications before your next appointment, please call your pharmacy. *  Labwork: None ordered  Testing/Procedures: Your physician has recommended that you have a defibrillator inserted. An implantable cardioverter defibrillator (ICD) is a small device that is placed in your chest or, in rare cases, your abdomen. This device uses electrical pulses or shocks to help control life-threatening, irregular heartbeats that could lead the heart to suddenly stop beating (sudden cardiac arrest). Leads are attached to the ICD that goes into your heart. This is done in the hospital and usually requires an overnight stay. Please follow the instructions below, located under the special instructions section.  The following dates are available (these are subject to change): 11/18, 11/29, 12/2  Follow-Up: To be determined once procedure is scheduled  * Please note that any paperwork needing to be filled out by the provider will need to be addressed at the front desk prior to seeing the provider.  Please note that any FMLA, disability or other documents regarding health condition is subject to a $25.00 charge that must be received prior to completion of paperwork in the form of a money order or check. *  Thank you for choosing [facility]!!   Dory Horn, RN [phone]   Any Other Special Instructions Will Be Listed Below (If Applicable).     Implantable Device Instructions  You are scheduled for:                  _____ Implantable Cardioverter Defibrillator  on  ____  with Dr. Elberta Fortis.  1.   Please arrive at the [facility], Entrance "A"  at [facility] at  _____ on the day of your procedure. (The address is [address])  2. Do not eat or drink after  midnight the night before your procedure.  3.   Complete pre procedure  lab work on _______.  The lab at [facility] is open from 8:00 AM to 4:30 PM.  You do not have to be fasting.  4.   A) -Medication instructions:   5.  Plan for an overnight stay.  Bring your insurance cards and a list of you medications.  6.  Wash your chest and neck with surgical scrub the evening before and the morning of  your procedure.  Rinse well. Please review the surgical scrub instruction sheet given to you.  7. Your chest will need to be shaved prior to this procedure (if needed). We ask that you do this yourself at home 1 to 2 days before or if uncomfortable/unable to do yourself, then it will be performed by the hospital staff the day of.                                                                                                                *  If you have ANY questions after you get home, please call Dory Horn, RN @ [phone].  * Every attempt is made to prevent procedures from being rescheduled.  Due to the nature of  Electrophysiology, rescheduling can happen.  The physician is always aware and directs the staff when this occurs.        Cardioverter Defibrillator Implantation An implantable cardioverter defibrillator (ICD) is a small, lightweight, battery-powered device that is placed (implanted) under the skin in the chest or abdomen. Your caregiver may prescribe an ICD if:  You have had an irregular heart rhythm (arrhythmia) that originated in the lower chambers of the heart (ventricles).  Your heart has been damaged by a disease (such as coronary artery disease) or heart condition (such as a heart attack). An ICD consists of a battery that lasts several years, a small computer called a pulse generator, and wires called leads that go into the heart. It is used to detect and correct two dangerous arrhythmias: a rapid heart rhythm (tachycardia) and an arrhythmia in which the  ventricles contract in an uncoordinated way (fibrillation). When an ICD detects tachycardia, it sends an electrical signal to the heart that restores the heartbeat to normal (cardioversion). This signal is usually painless. If cardioversion does not work or if the ICD detects fibrillation, it delivers a small electrical shock to the heart (defibrillation) to restart the heart. The shock may feel like a strong jolt in the chest. ICDs may be programmed to correct other problems. Sometimes, ICDs are programmed to act as another type of implantable device called a pacemaker. Pacemakers are used to treat a slow heartbeat (bradycardia). LET YOUR CAREGIVER KNOW ABOUT:  Any allergies you have.  All medicines you are taking, including vitamins, herbs, eyedrops, and over-the-counter medicines and creams.  Previous problems you or members of your family have had with the use of anesthetics.  Any blood disorders you have had.  Other health problems you have. RISKS AND COMPLICATIONS Generally, the procedure to implant an ICD is safe. However, as with any surgical procedure, complications can occur. Possible complications associated with implanting an ICD include:  Swelling, bleeding, or bruising at the site where the ICD was implanted.  Infection at the site where the ICD was implanted.  A reaction to medicine used during the procedure.  Nerve, heart, or blood vessel damage.  Blood clots. BEFORE THE PROCEDURE  You may need to have blood tests, heart tests, or a chest X-ray done before the day of the procedure.  Ask your caregiver about changing or stopping your regular medicines.  Make plans to have someone drive you home. You may need to stay in the hospital overnight after the procedure.  Stop smoking at least 24 hours before the procedure.  Take a bath or shower the night before the procedure. You may need to scrub your chest or abdomen with a special type of soap.  Do not eat or drink  before your procedure for as long as directed by your caregiver. Ask if it is okay to take any needed medicine with a small sip of water. PROCEDURE  The procedure to implant an ICD in your chest or abdomen is usually done at a hospital in a room that has a large X-ray machine called a fluoroscope. The machine will be above you during the procedure. It will help your caregiver see your heart during the procedure. Implanting an ICD usually takes 1-3 hours. Before the procedure:   Small monitors will be  put on your body. They will be used to check your heart, blood pressure, and oxygen level.  A needle will be put into a vein in your hand or arm. This is called an intravenous (IV) access tube. Fluids and medicine will flow directly into your body through the IV tube.  Your chest or abdomen will be cleaned with a germ-killing (antiseptic) solution. The area may be shaved.  You may be given medicine to help you relax (sedative).  You will be given a medicine called a local anesthetic. This medicine will make the surgical site numb while the ICD is implanted. You will be sleepy but awake during the procedure. After you are numb the procedure will begin. The caregiver will:  Make a small cut (incision). This will make a pocket deep under your skin that will hold the pulse generator.  Guide the leads through a large blood vessel into your heart and attach them to the heart muscles. Depending on the ICD, the leads may go into one ventricle or they may go to both ventricles and into an upper chamber of the heart (atrium).  Test the ICD.  Close the incision with stitches, glue, or staples. AFTER THE PROCEDURE  You may feel pain. Some pain is normal. It may last a few days.  You may stay in a recovery area until the local anesthetic has worn off. Your blood pressure and pulse will be checked often. You will be taken to a room where your heart will be monitored.  A chest X-ray will be taken. This is  done to check that the cardioverter defibrillator is in the right place.  You may stay in the hospital overnight.  A slight bump may be seen over the skin where the ICD was placed. Sometimes, it is possible to feel the ICD under the skin. This is normal.  In the months and years afterward, your caregiver will check the device, the leads, and the battery every few months. Eventually, when the battery is low, the ICD will be replaced.   This information is not intended to replace advice given to you by your health care provider. Make sure you discuss any questions you have with your health care provider.   Document Released: 04/26/2002 Document Revised: 05/25/2013 Document Reviewed: 08/23/2012 Elsevier Interactive Patient Education 2016 [employer]    Cardioverter Defibrillator Implantation, Care After This sheet gives you information about how to care for yourself after your procedure. Your health care provider may also give you more specific instructions. If you have problems or questions, contact your health care provider. What can I expect after the procedure? After the procedure, it is common to have:  Some pain. It may last a few days.  A slight bump over the skin where the device was placed. Sometimes, it is possible to feel the device under the skin. This is normal.  During the months and years after your procedure, your health care provider will check the device, the leads, and the battery every few months. Eventually, when the battery is low, the device will be replaced. Follow these instructions at home: Medicines  Take over-the-counter and prescription medicines only as told by your health care provider.  If you were prescribed an antibiotic medicine, take it as told by your health care provider. Do not stop taking the antibiotic even if you start to feel better. Incision care   Follow instructions from your health care provider about how to take care of your incision  area. Make sure you: ? Wash your hands with soap and water before you change your bandage (dressing). If soap and water are not available, use hand sanitizer. ? Change your dressing as told by your health care provider. ? Leave stitches (sutures), skin glue, or adhesive strips in place. These skin closures may need to stay in place for 2 weeks or longer. If adhesive strip edges start to loosen and curl up, you may trim the loose edges. Do not remove adhesive strips completely unless your health care provider tells you to do that.  Check your incision area every day for signs of infection. Check for: ? More redness, swelling, or pain. ? More fluid or blood. ? Warmth. ? Pus or a bad smell.  Do not use lotions or ointments near the incision area unless told by your health care provider.  Keep the incision area clean and dry for 2-3 days after the procedure or for as long as told by your health care provider. It takes several weeks for the incision site to heal completely.  Do not take baths, swim, or use a hot tub until your health care provider approves. Activity  Try to walk a little every day. Exercising is important after this procedure. Also, use your shoulder on the side of the defibrillator in daily tasks that do not require a lot of motion.  For at least 6 weeks: ? Do not lift your upper arm above your shoulders. This means no tennis, golf, or swimming for this period of time. If you tend to sleep with your arm above your head, use a restraint to prevent this during sleep. ? Avoid sudden jerking, pulling, or chopping movements that pull your upper arm far away from your body.  Ask your health care provider when you may go back to work.  Check with your health care provider before you start to drive or play sports. Electric and magnetic fields  Tell all health care providers that you have a defibrillator. This may prevent them from giving you an MRI scan because strong magnets are  used for that test.  If you must pass through a metal detector, quickly walk through it. Do not stop under the detector, and do not stand near it.  Avoid places or objects that have a strong electric or magnetic field, including: ? [employer]. At the airport, let officials know that you have a defibrillator. Your defibrillator ID card will let you be checked in a way that is safe for you and will not damage your defibrillator. Also, do not let a security person wave a magnetic wand near your defibrillator. That can make it stop working. ? Power plants. ? Large electrical generators. ? Anti-theft systems or electronic article surveillance (EAS). ? Radiofrequency transmission towers, such as cell phone and radio towers.  Do not use amateur (ham) radio equipment or electric (arc) welding torches. Some devices are safe to use if held at least 12 inches (30 cm) from your defibrillator. These include power tools, lawn mowers, and speakers. If you are unsure if something is safe to use, ask your health care provider.  Do not use MP3 player headphones. They have magnets.  You may safely use electric blankets, heating pads, computers, and microwave ovens.  When using your cell phone, hold it to the ear that is on the opposite side from the defibrillator. Do not leave your cell phone in a pocket over the defibrillator. General instructions  Follow diet instructions from  your health care provider, if this applies.  Always keep your defibrillator ID card with you. The card should list the implant date, device model, and manufacturer. Consider wearing a medical alert bracelet or necklace.  Have your defibrillator checked every 3-6 months or as often as told by your health care provider. Most defibrillators last for 4-8 years.  Keep all follow-up visits as told by your health care provider. This is important for your health care provider to make sure your chest is healing the way it should.  Ask your health care provider when you should come back to have your stitches or staples taken out. Contact a health care provider if:  You feel one shock in your chest.  You gain weight suddenly.  Your legs or feet swell more than they have before.  It feels like your heart is fluttering or skipping beats (heart palpitations).  You have more redness, swelling, or pain around your incision.  You have more fluid or blood coming from your incision.  Your incision feels warm to the touch.  You have pus or a bad smell coming from your incision.  You have a fever. Get help right away if:  You have chest pain.  You feel more than one shock.  You feel more short of breath than you have felt before.  You feel more light-headed than you have felt before.  Your incision starts to open up. This information is not intended to replace advice given to you by your health care provider. Make sure you discuss any questions you have with your health care provider. Document Released: 02/21/2005 Document Revised: 02/22/2016 Document Reviewed: 01/09/2016 Elsevier Interactive Patient Education  2018 [employer].     Supplemental Discharge Instructions for  Pacemaker/Defibrillator Patients  ACTIVITY No heavy lifting or vigorous activity with your left/right arm for 6 to 8 weeks.  Do not raise your left/right arm above your head for one week.  Gradually raise your affected arm as drawn below.           __  NO DRIVING for     ; you may begin driving on     .  WOUND CARE - Keep the wound area clean and dry.  Do not get this area wet for one week. No showers for one week; you may shower on     . - The tape/steri-strips on your wound will fall off; do not pull them off.  No bandage is needed on the site.  DO  NOT apply any creams, oils, or ointments to the wound area. - If you notice any drainage or discharge from the wound, any swelling or bruising at the site, or you develop a fever >  101? F after you are discharged home, call the office at once.  SPECIAL INSTRUCTIONS - You are still able to use cellular telephones; use the ear opposite the side where you have your pacemaker/defibrillator.  Avoid carrying your cellular phone near your device. - When traveling through airports, show security personnel your identification card to avoid being screened in the metal detectors.  Ask the security personnel to use the hand wand. - Avoid arc welding equipment, MRI testing (magnetic resonance imaging), TENS units (transcutaneous nerve stimulators).  Call the office for questions about other devices. - Avoid electrical appliances that are in poor condition or are not properly grounded. - Microwave ovens are safe to be near or to operate.  ADDITIONAL INFORMATION FOR DEFIBRILLATOR PATIENTS SHOULD YOUR DEVICE GO OFF: -  If your device goes off ONCE and you feel fine afterward, notify the device clinic nurses. - If your device goes off ONCE and you do not feel well afterward, call 911. - If your device goes off TWICE, call 911. - If your device goes off [place], call 911.  DO NOT DRIVE YOURSELF OR A FAMILY MEMBER WITH A DEFIBRILLATOR TO THE HOSPITAL-CALL 911.

## 2018-04-27 NOTE — Progress Notes (Signed)
Electrophysiology Office Note   Date:  04/27/2018   ID:  [name], DOB 1956-03-17, MRN [id number]  PCP:  [facility]  Cardiologist:  Hochrein Primary Electrophysiologist:  Jassen Sarver [name], MD    No chief complaint on file.    History of Present Illness: [name] is a 62 y.o. male who is being seen today for the evaluation of ischemic cardiomyopathy at the request of Angelina Sheriff. Presenting today for electrophysiology evaluation.  He has a history of coronary artery disease, hypertension, hyperlipidemia, OSA, and type 2 diabetes.  He has had multiple stents placed in the past.  He presents today for possible ICD.  Comes today or shortness of breath with exertion.  He feels well at rest.  Today, he denies symptoms of palpitations, chest pain, shortness of breath, orthopnea, PND, lower extremity edema, claudication, dizziness, presyncope, syncope, bleeding, or neurologic sequela. The patient is tolerating medications without difficulties.    Past Medical History:  Diagnosis Date  . ASCVD (arteriosclerotic cardiovascular disease)    a. MI 2004, did not seek care at that time. b. s/p PTCA/BMS to OM1 04/2011 (known totally occluded RCA with L-R collaterals and failed angioplasty on that vessel). c.)  Cath 2019 chronically occluded right coronary artery with collateralization.  90% circumflex stenosis treated with a DES.  Marland Kitchen Chronic bronchitis (HCC)   . HTN (hypertension)   . Hyperlipidemia   . Ischemic cardiomyopathy    EF 30%.    . OSA (obstructive sleep apnea) 05/24/11  . Restless leg syndrome 06/25/2011  . Type 2 diabetes mellitus (HCC)    Past Surgical History:  Procedure Laterality Date  . CARDIAC CATHETERIZATION  11/24/2012   40-50% ostial LAD, 40% mid LAD, 60-70% ostial D1, 40% proximal LCx, patent OM branch stent, 99% serial proximal-mid RCA lesions, 100% mid RCA occlusion (chronic with left to right collateralization); EF 40%, inferior wall  hypokinesis.  Marland Kitchen CARDIAC CATHETERIZATION N/A 05/31/2015   Procedure: Left Heart Cath and Coronary Angiography;  Surgeon: Kathleene Hazel, MD;  Location: [facility] INVASIVE CV LAB;  Service: Cardiovascular;  Laterality: N/A;  . CORONARY STENT INTERVENTION N/A 12/25/2017   Procedure: CORONARY STENT INTERVENTION;  Surgeon: Swaziland, Peter M, MD;  Location: [facility] INVASIVE CV LAB;  Service: Cardiovascular;  Laterality: N/A;  . CORONARY STENT PLACEMENT  04/2011  . CYSTECTOMY     Spine and jaw  . LEFT HEART CATH AND CORONARY ANGIOGRAPHY N/A 12/25/2017   Procedure: LEFT HEART CATH AND CORONARY ANGIOGRAPHY;  Surgeon: Swaziland, Peter M, MD;  Location: [facility] INVASIVE CV LAB;  Service: Cardiovascular;  Laterality: N/A;  . LEFT HEART CATHETERIZATION WITH CORONARY ANGIOGRAM N/A 11/24/2012   Procedure: LEFT HEART CATHETERIZATION WITH CORONARY ANGIOGRAM;  Surgeon: Kathleene Hazel, MD;  Location: [facility] CATH LAB;  Service: Cardiovascular;  Laterality: N/A;  . lesion removed from foot     right foot  . TONSILLECTOMY AND ADENOIDECTOMY       Current Outpatient Medications  Medication Sig Dispense Refill  . albuterol (PROVENTIL HFA;VENTOLIN HFA) 108 (90 Base) MCG/ACT inhaler Inhale 1-2 puffs into the lungs every 6 (six) hours as needed for wheezing or shortness of breath.    Marland Kitchen aspirin 81 MG tablet Take 81 mg by mouth every evening.     . carvedilol (COREG) 3.125 MG tablet Take 1 tablet (3.125 mg total) by mouth 2 (two) times daily with a meal. 90 tablet 3  . CINNAMON PO Take 1,000 mg by mouth daily.    Marland Kitchen  clopidogrel (PLAVIX) 75 MG tablet Take 1 tablet (75 mg total) by mouth daily. 90 tablet 3  . Coenzyme Q10 (CO Q 10) 100 MG CAPS Take 100 mg by mouth daily.    Marland Kitchen JARDIANCE 10 MG TABS tablet Take 10 mg by mouth daily.    . magnesium gluconate (MAGONATE) 500 MG tablet Take 1,000 mg by mouth daily.    . metFORMIN (GLUCOPHAGE) 1000 MG tablet Take 1,000 mg by mouth 2 (two) times daily.    . nitroGLYCERIN (NITROSTAT) 0.4 MG SL  tablet Place 1 tablet (0.4 mg total) under the tongue every 5 (five) minutes as needed for chest pain. 30 tablet 1  . pantoprazole (PROTONIX) 40 MG tablet Take 1 tablet (40 mg total) by mouth every evening. 30 tablet 0  . pramipexole (MIRAPEX) 1 MG tablet Take 0.5 mg by mouth 4 (four) times daily.     . sacubitril-valsartan (ENTRESTO) 24-26 MG Take 1 tablet by mouth 2 (two) times daily. 180 tablet 3  . spironolactone (ALDACTONE) 25 MG tablet Take 0.5 tablets (12.5 mg total) by mouth daily. 45 tablet 3  . VOLTAREN 1 % GEL APP 2 GRAMS EXT AA 3 TO 4 XD  1   No current facility-administered medications for this visit.     Allergies:   Brilinta [ticagrelor]; Codeine; Lipitor [atorvastatin calcium]; and Niaspan [niacin er]   Social History:  The patient  reports that he quit smoking about 12 years ago. His smoking use included cigarettes. He has a 52.50 pack-year smoking history. He has never used smokeless tobacco. He reports that he does not drink alcohol or use drugs.   Family History:  The patient's family history includes Heart attack (age of onset: 22) in his brother; Heart attack (age of onset: 49) in his father; Lymphoma in his mother.    ROS:  Please see the history of present illness.   Otherwise, review of systems is positive for fatigue.   All other systems are reviewed and negative.    PHYSICAL EXAM: VS:  BP 130/80   Pulse 66   Ht 5\' 10"  (1.778 m)   Wt 211 lb (95.7 kg)   BMI 30.28 kg/m  , BMI Body mass index is 30.28 kg/m. GEN: Well nourished, well developed, in no acute distress  HEENT: normal  Neck: no JVD, carotid bruits, or masses Cardiac: RRR; no murmurs, rubs, or gallops,no edema  Respiratory:  clear to auscultation bilaterally, normal work of breathing GI: soft, nontender, nondistended, + BS MS: no deformity or atrophy  Skin: warm and dry Neuro:  Strength and sensation are intact Psych: euthymic mood, full affect  EKG:  EKG is ordered today. Personal review of  the ekg ordered shows anus rhythm, PVC, inferior, rate 66  Recent Labs: 12/24/2017: ALT 46; B Natriuretic Peptide 26.5; Magnesium 1.9; TSH 1.956 01/21/2018: BUN 16; Creatinine, Ser 0.86; Hemoglobin 16.2; NT-Pro BNP 39; Platelets 121; Potassium 4.1; Sodium 138    Lipid Panel     Component Value Date/Time   CHOL 127 12/25/2017 0308   TRIG 128 12/25/2017 0308   HDL 33 (L) 12/25/2017 0308   CHOLHDL 3.8 12/25/2017 0308   VLDL 26 12/25/2017 0308   LDLCALC 68 12/25/2017 0308     Wt Readings from Last 3 Encounters:  04/27/18 211 lb (95.7 kg)  03/31/18 210 lb 6.4 oz (95.4 kg)  02/05/18 209 lb (94.8 kg)      Other studies Reviewed: Additional studies/ records that were reviewed today include: TTE 03/15/18  Review of the above records today demonstrates:  - Left ventricle: The cavity size was mildly dilated. Wall   thickness was normal. Systolic function was severely reduced. The   estimated ejection fraction was in the range of 25% to 30%.   Diffuse hypokinesis. There is akinesis of the inferolateral   myocardium. Doppler parameters are consistent with abnormal left   ventricular relaxation (grade 1 diastolic dysfunction). - Right ventricle: The cavity size was mildly dilated. - Right atrium: The atrium was mildly dilated.  LHC 12/25/17  Prox RCA lesion is 99% stenosed.  Prox RCA to Mid RCA lesion is 90% stenosed.  Mid RCA lesion is 100% stenosed.  Prox LAD lesion is 40% stenosed.  Ost 1st Diag lesion is 90% stenosed.  Prox LAD to Mid LAD lesion is 10% stenosed.  Previously placed Ost 1st Mrg to 1st Mrg stent (unknown type) is widely patent.  Ost Cx to Prox Cx lesion is 90% stenosed.  Post intervention, there is a 0% residual stenosis.  A drug-eluting stent was successfully placed using a STENT SYNERGY DES 3.5X12.  Non-stenotic Prox Cx to Mid Cx lesion.  LV end diastolic pressure is normal.   ASSESSMENT AND PLAN:  1.  Coronary artery disease: Status multiple stents  placed.  Currently on aspirin and Plavix.  No current chest pain.  2.  Ischemic cardiomyopathy: Currently on optimal medical therapy including Coreg, Entresto, and Aldactone.  He would thus qualify for an ICD to be implanted.  Risks and benefits were discussed and include bleeding, tamponade, infection, and pneumothorax.  The patient understands these risks and is agreed to the procedure.  3.  Hypertension: Blood pressure has been low and thus titration of heart failure medications has been difficult.  No changes.   Current medicines are reviewed at length with the patient today.   The patient does not have concerns regarding his medicines.  The following changes were made today:  none  Labs/ tests ordered today include:  Orders Placed This Encounter  Procedures  . EKG 12-Lead   Case discussed with primary cardiology  Disposition:   FU with Patience Nuzzo 3 months  Signed, Marlow Hendrie [name], MD  04/27/2018 10:08 AM     [facility] [address] [place] [address] [phone] (office) [phone] (fax)

## 2018-05-13 ENCOUNTER — Telehealth: Payer: Self-pay | Admitting: Cardiology

## 2018-05-13 NOTE — Telephone Encounter (Signed)
Spoke to patient. Patient wanted to make sure that disability forms were filled out for long term disability if they sent..  RN informed patient no notation in chart,but will send to medical assistant. Recommend patient to contact  The company in several days to see if papers are completed. Verbalized understanding.

## 2018-05-13 NOTE — Telephone Encounter (Signed)
New Message:    Patient wife calling about some papers that he needs the Dr. Lenox Ahr out some paper work.

## 2018-05-26 DIAGNOSIS — Z23 Encounter for immunization: Secondary | ICD-10-CM | POA: Diagnosis not present

## 2018-06-07 ENCOUNTER — Telehealth (HOSPITAL_COMMUNITY): Payer: Self-pay | Admitting: Cardiology

## 2018-06-07 NOTE — Telephone Encounter (Signed)
New message   Patient's wife states that [facility] has not received the disability paperwork that should be signed by Dr. Antoine Poche. Please call to discuss.

## 2018-06-07 NOTE — Telephone Encounter (Signed)
FYI

## 2018-06-07 NOTE — Telephone Encounter (Signed)
Spoke with pt letting him know I have not received any paper work from [employer]

## 2018-06-29 DIAGNOSIS — E785 Hyperlipidemia, unspecified: Secondary | ICD-10-CM | POA: Diagnosis not present

## 2018-06-29 DIAGNOSIS — I10 Essential (primary) hypertension: Secondary | ICD-10-CM | POA: Diagnosis not present

## 2018-06-29 DIAGNOSIS — E119 Type 2 diabetes mellitus without complications: Secondary | ICD-10-CM | POA: Diagnosis not present

## 2018-07-06 ENCOUNTER — Ambulatory Visit: Payer: 59 | Admitting: Cardiology

## 2018-07-06 ENCOUNTER — Encounter: Payer: Self-pay | Admitting: Cardiology

## 2018-07-06 VITALS — BP 124/72 | HR 77 | Ht 70.0 in | Wt 214.0 lb

## 2018-07-06 DIAGNOSIS — I5022 Chronic systolic (congestive) heart failure: Secondary | ICD-10-CM

## 2018-07-06 DIAGNOSIS — Z01812 Encounter for preprocedural laboratory examination: Secondary | ICD-10-CM | POA: Diagnosis not present

## 2018-07-06 DIAGNOSIS — I255 Ischemic cardiomyopathy: Secondary | ICD-10-CM

## 2018-07-06 DIAGNOSIS — I2510 Atherosclerotic heart disease of native coronary artery without angina pectoris: Secondary | ICD-10-CM

## 2018-07-06 LAB — BASIC METABOLIC PANEL
BUN/Creatinine Ratio: 18 (ref 10–24)
BUN: 18 mg/dL (ref 8–27)
CHLORIDE: 101 mmol/L (ref 96–106)
CO2: 17 mmol/L — AB (ref 20–29)
CREATININE: 0.98 mg/dL (ref 0.76–1.27)
Calcium: 9.4 mg/dL (ref 8.6–10.2)
GFR calc Af Amer: 94 mL/min/{1.73_m2} (ref >59)
GFR calc non Af Amer: 82 mL/min/{1.73_m2} (ref >59)
GLUCOSE: 183 mg/dL — AB (ref 65–99)
POTASSIUM: 4.4 mmol/L (ref 3.5–5.2)
SODIUM: 137 mmol/L (ref 134–144)

## 2018-07-06 LAB — CBC
HEMOGLOBIN: 16.4 g/dL (ref 13.0–17.7)
Hematocrit: 48.4 % (ref 37.5–51.0)
MCH: 31.4 pg (ref 26.6–33.0)
MCHC: 33.9 g/dL (ref 31.5–35.7)
MCV: 93 fL (ref 79–97)
PLATELETS: 156 10*3/uL (ref 150–450)
RBC: 5.23 x10E6/uL (ref 4.14–5.80)
RDW: 13.2 % (ref 12.3–15.4)
WBC: 11.1 10*3/uL — ABNORMAL HIGH (ref 3.4–10.8)

## 2018-07-06 NOTE — Addendum Note (Signed)
Addended by: Marylou Flesher on: 07/06/2018 11:33 AM   Modules accepted: Orders

## 2018-07-06 NOTE — Progress Notes (Signed)
Electrophysiology Office Note   Date:  07/06/2018   ID:  [name], DOB 1955/09/11, MRN [id number]  PCP:  Hal Morales, NP  Cardiologist:  Antoine Poche Primary Electrophysiologist:  Legend Pecore [name], MD    No chief complaint on file.    History of Present Illness: [name] is a 62 y.o. male who is being seen today for the evaluation of ischemic cardiomyopathy at the request of [name]. Presenting today for electrophysiology evaluation.  He has a history of coronary artery disease, hypertension, hyperlipidemia, OSA, and type 2 diabetes.  He has had multiple stents placed in the past.  He presents today for possible ICD.  Comes today or shortness of breath with exertion.  He feels well at rest.  Today, denies symptoms of palpitations, chest pain, shortness of breath, orthopnea, PND, lower extremity edema, claudication, dizziness, presyncope, syncope, bleeding, or neurologic sequela. The patient is tolerating medications without difficulties.     Past Medical History:  Diagnosis Date  . ASCVD (arteriosclerotic cardiovascular disease)    a. MI 2004, did not seek care at that time. b. s/p PTCA/BMS to OM1 04/2011 (known totally occluded RCA with L-R collaterals and failed angioplasty on that vessel). c.)  Cath 2019 chronically occluded right coronary artery with collateralization.  90% circumflex stenosis treated with a DES.  Marland Kitchen Chronic bronchitis (HCC)   . HTN (hypertension)   . Hyperlipidemia   . Ischemic cardiomyopathy    EF 30%.    . OSA (obstructive sleep apnea) 05/24/11  . Restless leg syndrome 06/25/2011  . Type 2 diabetes mellitus (HCC)    Past Surgical History:  Procedure Laterality Date  . CARDIAC CATHETERIZATION  11/24/2012   40-50% ostial LAD, 40% mid LAD, 60-70% ostial D1, 40% proximal LCx, patent OM branch stent, 99% serial proximal-mid RCA lesions, 100% mid RCA occlusion (chronic with left to right collateralization); EF 40%, inferior wall hypokinesis.   Marland Kitchen CARDIAC CATHETERIZATION N/A 05/31/2015   Procedure: Left Heart Cath and Coronary Angiography;  Surgeon: Kathleene Hazel, MD;  Location: [facility] INVASIVE CV LAB;  Service: Cardiovascular;  Laterality: N/A;  . CORONARY STENT INTERVENTION N/A 12/25/2017   Procedure: CORONARY STENT INTERVENTION;  Surgeon: Swaziland, Peter M, MD;  Location: [facility] INVASIVE CV LAB;  Service: Cardiovascular;  Laterality: N/A;  . CORONARY STENT PLACEMENT  04/2011  . CYSTECTOMY     Spine and jaw  . LEFT HEART CATH AND CORONARY ANGIOGRAPHY N/A 12/25/2017   Procedure: LEFT HEART CATH AND CORONARY ANGIOGRAPHY;  Surgeon: Swaziland, Peter M, MD;  Location: [facility] INVASIVE CV LAB;  Service: Cardiovascular;  Laterality: N/A;  . LEFT HEART CATHETERIZATION WITH CORONARY ANGIOGRAM N/A 11/24/2012   Procedure: LEFT HEART CATHETERIZATION WITH CORONARY ANGIOGRAM;  Surgeon: Kathleene Hazel, MD;  Location: [facility] CATH LAB;  Service: Cardiovascular;  Laterality: N/A;  . lesion removed from foot     right foot  . TONSILLECTOMY AND ADENOIDECTOMY       Current Outpatient Medications  Medication Sig Dispense Refill  . albuterol (PROVENTIL HFA;VENTOLIN HFA) 108 (90 Base) MCG/ACT inhaler Inhale 1-2 puffs into the lungs every 6 (six) hours as needed for wheezing or shortness of breath.    Marland Kitchen aspirin 81 MG tablet Take 81 mg by mouth every evening.     . carvedilol (COREG) 3.125 MG tablet Take 1 tablet (3.125 mg total) by mouth 2 (two) times daily with a meal. 90 tablet 3  . CINNAMON PO Take 1,000 mg by mouth daily.    Marland Kitchen  clopidogrel (PLAVIX) 75 MG tablet Take 1 tablet (75 mg total) by mouth daily. 90 tablet 3  . Coenzyme Q10 (CO Q 10) 100 MG CAPS Take 100 mg by mouth daily.    Marland Kitchen JARDIANCE 10 MG TABS tablet Take 10 mg by mouth daily.    . magnesium gluconate (MAGONATE) 500 MG tablet Take 1,000 mg by mouth daily.    . metFORMIN (GLUCOPHAGE) 1000 MG tablet Take 1,000 mg by mouth 2 (two) times daily.    . nitroGLYCERIN (NITROSTAT) 0.4 MG SL tablet Place  1 tablet (0.4 mg total) under the tongue every 5 (five) minutes as needed for chest pain. 30 tablet 1  . pantoprazole (PROTONIX) 40 MG tablet Take 1 tablet (40 mg total) by mouth every evening. 30 tablet 0  . pramipexole (MIRAPEX) 1 MG tablet Take 0.5 mg by mouth 4 (four) times daily.     . sacubitril-valsartan (ENTRESTO) 24-26 MG Take 1 tablet by mouth 2 (two) times daily. 180 tablet 3  . spironolactone (ALDACTONE) 25 MG tablet Take 0.5 tablets (12.5 mg total) by mouth daily. 45 tablet 3  . VOLTAREN 1 % GEL APP 2 GRAMS EXT AA 3 TO 4 XD  1   No current facility-administered medications for this visit.     Allergies:   Brilinta [ticagrelor]; Codeine; Lipitor [atorvastatin calcium]; and Niaspan [niacin er]   Social History:  The patient  reports that he quit smoking about 12 years ago. His smoking use included cigarettes. He has a 52.50 pack-year smoking history. He has never used smokeless tobacco. He reports that he does not drink alcohol or use drugs.   Family History:  The patient's family history includes Heart attack (age of onset: 30) in his brother; Heart attack (age of onset: 27) in his father; Lymphoma in his mother.   ROS:  Please see the history of present illness.   Otherwise, review of systems is positive for fatigue, snoring, shortness of breath.   All other systems are reviewed and negative.   PHYSICAL EXAM: VS:  BP 124/72   Pulse 77   Ht 5\' 10"  (1.778 m)   Wt 214 lb (97.1 kg)   SpO2 93%   BMI 30.71 kg/m  , BMI Body mass index is 30.71 kg/m. GEN: Well nourished, well developed, in no acute distress  HEENT: normal  Neck: no JVD, carotid bruits, or masses Cardiac: RRR; no murmurs, rubs, or gallops,no edema  Respiratory:  clear to auscultation bilaterally, normal work of breathing GI: soft, nontender, nondistended, + BS MS: no deformity or atrophy  Skin: warm and dry Neuro:  Strength and sensation are intact Psych: euthymic mood, full affect  EKG:  EKG is not ordered  today. Personal review of the ekg ordered 04/27/18 shows SR, IMI   Recent Labs: 12/24/2017: ALT 46; B Natriuretic Peptide 26.5; Magnesium 1.9; TSH 1.956 01/21/2018: BUN 16; Creatinine, Ser 0.86; Hemoglobin 16.2; NT-Pro BNP 39; Platelets 121; Potassium 4.1; Sodium 138    Lipid Panel     Component Value Date/Time   CHOL 127 12/25/2017 0308   TRIG 128 12/25/2017 0308   HDL 33 (L) 12/25/2017 0308   CHOLHDL 3.8 12/25/2017 0308   VLDL 26 12/25/2017 0308   LDLCALC 68 12/25/2017 0308     Wt Readings from Last 3 Encounters:  07/06/18 214 lb (97.1 kg)  04/27/18 211 lb (95.7 kg)  03/31/18 210 lb 6.4 oz (95.4 kg)      Other studies Reviewed: Additional studies/ records that were reviewed  today include: TTE 03/15/18  Review of the above records today demonstrates:  - Left ventricle: The cavity size was mildly dilated. Wall   thickness was normal. Systolic function was severely reduced. The   estimated ejection fraction was in the range of 25% to 30%.   Diffuse hypokinesis. There is akinesis of the inferolateral   myocardium. Doppler parameters are consistent with abnormal left   ventricular relaxation (grade 1 diastolic dysfunction). - Right ventricle: The cavity size was mildly dilated. - Right atrium: The atrium was mildly dilated.  LHC 12/25/17  Prox RCA lesion is 99% stenosed.  Prox RCA to Mid RCA lesion is 90% stenosed.  Mid RCA lesion is 100% stenosed.  Prox LAD lesion is 40% stenosed.  Ost 1st Diag lesion is 90% stenosed.  Prox LAD to Mid LAD lesion is 10% stenosed.  Previously placed Ost 1st Mrg to 1st Mrg stent (unknown type) is widely patent.  Ost Cx to Prox Cx lesion is 90% stenosed.  Post intervention, there is a 0% residual stenosis.  A drug-eluting stent was successfully placed using a STENT SYNERGY DES 3.5X12.  Non-stenotic Prox Cx to Mid Cx lesion.  LV end diastolic pressure is normal.   ASSESSMENT AND PLAN:  1.  Coronary artery disease: Status post  multiple stents placed.  No current chest pain.  2.  Ischemic cardiomyopathy: Scheduled for ICD implant 07/19/18.  Risks and benefits were discussed including bleeding, tamponade, infection, pneumothorax.  The patient understands the risks and is agreed to the procedure.  Of note he does work with electronic and a welding appointment.  He has checked his should be okay with an ICD.  3.  Hypertension: well controlled, no changes  Current medicines are reviewed at length with the patient today.   The patient does not have concerns regarding his medicines.  The following changes were made today:  none  Labs/ tests ordered today include:  Orders Placed This Encounter  Procedures  . Basic metabolic panel  . CBC    Disposition:   FU with Amando Chaput 3 months  Signed, Glen Kesinger [name], MD  07/06/2018 11:17 AM     [facility] [address] [place] [address] [phone] (office) [phone] (fax)

## 2018-07-06 NOTE — Patient Instructions (Addendum)
Medication Instructions:  Your physician recommends that you continue on your current medications as directed. Please refer to the Current Medication list given to you today.     * If you need a refill on your cardiac medications before your next appointment, please call your pharmacy. *  Labwork: Pre procedure labs today: BMP & CBC  Testing/Procedures: Your physician has recommended that you have a defibrillator inserted. An implantable cardioverter defibrillator (ICD) is a small device that is placed in your chest or, in rare cases, your abdomen. This device uses electrical pulses or shocks to help control life-threatening, irregular heartbeats that could lead the heart to suddenly stop beating (sudden cardiac arrest). Leads are attached to the ICD that goes into your heart. This is done in the hospital and usually requires an overnight stay. Please follow the instructions below located under special instructions area.  Follow-Up: Your physician recommends that you schedule a follow-up appointment in: 10-14 days, after your procedure on 07/19/2018, with device clinic for a wound check.  Your physician recommends that you schedule a follow-up appointment in: 3 months, after your procedure on 07/19/2018, with Dr. Elberta Fortis.  * Please note that any paperwork needing to be filled out by the provider will need to be addressed at the front desk prior to seeing the provider.  Please note that any FMLA, disability or other documents regarding health condition is subject to a $25.00 charge that must be received prior to completion of paperwork in the form of a money order or check. *  Thank you for choosing [facility]!!   Dory Horn, RN [phone]   Any Other Special Instructions Will Be Listed Below (If Applicable).     Implantable Device Instructions  You are scheduled for:                  _____ Implantable Cardioverter Defibrillator  on  07/19/2018 with Dr.  Elberta Fortis.  1.   Please arrive at the [facility], Entrance "A"  at [facility] at  5:30 a.m. on the day of your procedure. (The address is [address]  2. Do not eat or drink after midnight the night before your procedure.  3.   Complete pre procedure  lab work on 07/06/18.  You do not have to be fasting.  4.   Hold all of your medications the morning of your procedure.  5.  Plan for an overnight stay.  Bring your insurance cards and a list of you medications.  6.  Wash your chest and neck with surgical scrub the evening before and the morning of  your procedure.  Rinse well. Please review the surgical scrub instruction sheet given to you.  7. Your chest will need to be shaved prior to this procedure (if needed). We ask that you do this yourself at home 1 to 2 days before or if uncomfortable/unable to do yourself, then it will be performed by the hospital staff the day of.                                                                                                                *  If you have ANY questions after you get home, please call Dory Horn, RN @ [phone].  * Every attempt is made to prevent procedures from being rescheduled.  Due to the nature of  Electrophysiology, rescheduling can happen.  The physician is always aware and directs the staff when this occurs.        Cardioverter Defibrillator Implantation An implantable cardioverter defibrillator (ICD) is a small, lightweight, battery-powered device that is placed (implanted) under the skin in the chest or abdomen. Your caregiver may prescribe an ICD if:  You have had an irregular heart rhythm (arrhythmia) that originated in the lower chambers of the heart (ventricles).  Your heart has been damaged by a disease (such as coronary artery disease) or heart condition (such as a heart attack). An ICD consists of a battery that lasts several years, a small computer called a pulse  generator, and wires called leads that go into the heart. It is used to detect and correct two dangerous arrhythmias: a rapid heart rhythm (tachycardia) and an arrhythmia in which the ventricles contract in an uncoordinated way (fibrillation). When an ICD detects tachycardia, it sends an electrical signal to the heart that restores the heartbeat to normal (cardioversion). This signal is usually painless. If cardioversion does not work or if the ICD detects fibrillation, it delivers a small electrical shock to the heart (defibrillation) to restart the heart. The shock may feel like a strong jolt in the chest.ICDs may be programmed to correct other problems. Sometimes, ICDs are programmed to act as another type of implantable device called a pacemaker. Pacemakers are used to treat a slow heartbeat (bradycardia). LET YOUR CAREGIVER KNOW ABOUT:  Any allergies you have.  All medicines you are taking, including vitamins, herbs, eyedrops, and over-the-counter medicines and creams.  Previous problems you or members of your family have had with the use of anesthetics.  Any blood disorders you have had.  Other health problems you have. RISKS AND COMPLICATIONS Generally, the procedure to implant an ICD is safe. However, as with any surgical procedure, complications can occur. Possible complications associated with implanting an ICD include:  Swelling, bleeding, or bruising at the site where the ICD was implanted.  Infection at the site where the ICD was implanted.  A reaction to medicine used during the procedure.  Nerve, heart, or blood vessel damage.  Blood clots. BEFORE THE PROCEDURE  You may need to have blood tests, heart tests, or a chest X-ray done before the day of the procedure.  Ask your caregiver about changing or stopping your regular medicines.  Make plans to have someone drive you home. You may need to stay in the hospital overnight after the procedure.  Stop smoking at least 24  hours before the procedure.  Take a bath or shower the night before the procedure. You may need to scrub your chest or abdomen with a special type of soap.  Do not eat or drink before your procedure for as long as directed by your caregiver. Ask if it is okay to take any needed medicine with a small sip of water. PROCEDURE  The procedure to implant an ICD in your chest or abdomen is usually done at a hospital in a room that has a large X-ray machine called a fluoroscope. The machine will be above you during the procedure. It will help your caregiver see your heart during the procedure. Implanting an ICD usually takes 1-3 hours. Before the procedure:   Small monitors will be put  on your body. They will be used to check your heart, blood pressure, and oxygen level.  A needle will be put into a vein in your hand or arm. This is called an intravenous (IV) access tube. Fluids and medicine will flow directly into your body through the IV tube.  Your chest or abdomen will be cleaned with a germ-killing (antiseptic) solution. The area may be shaved.  You may be given medicine to help you relax (sedative).  You will be given a medicine called a local anesthetic. This medicine will make the surgical site numb while the ICD is implanted. You will be sleepy but awake during the procedure. After you are numb the procedure will begin. The caregiver will:  Make a small cut (incision). This will make a pocket deep under your skin that will hold the pulse generator.  Guide the leads through a large blood vessel into your heart and attach them to the heart muscles. Depending on the ICD, the leads may go into one ventricle or they may go to both ventricles and into an upper chamber of the heart (atrium).  Test the ICD.  Close the incision with stitches, glue, or staples. AFTER THE PROCEDURE  You may feel pain. Some pain is normal. It may last a few days.  You may stay in a recovery area until the local  anesthetic has worn off. Your blood pressure and pulse will be checked often. You will be taken to a room where your heart will be monitored.  A chest X-ray will be taken. This is done to check that the cardioverter defibrillator is in the right place.  You may stay in the hospital overnight.  A slight bump may be seen over the skin where the ICD was placed. Sometimes, it is possible to feel the ICD under the skin. This is normal.  In the months and years afterward, your caregiver will check the device, the leads, and the battery every few months. Eventually, when the battery is low, the ICD will be replaced.  This information is not intended to replace advice given to you by your health care provider. Make sure you discuss any questions you have with your health care provider.  Document Released: 04/26/2002 Document Revised: 05/25/2013 Document Reviewed: 08/23/2012 Elsevier Interactive Patient Education 2016 [employer]    Cardioverter Defibrillator Implantation, Care After This sheet gives you information about how to care for yourself after your procedure. Your health care provider may also give you more specific instructions. If you have problems or questions, contact your health care provider. What can I expect after the procedure? After the procedure, it is common to have:  Some pain. It may last a few days.  A slight bump over the skin where the device was placed. Sometimes, it is possible to feel the device under the skin. This is normal.  During the months and years after your procedure, your health care provider will check the device, the leads, and the battery every few months. Eventually, when the battery is low, the device will be replaced. Follow these instructions at home: Medicines  Take over-the-counter and prescription medicines only as told by your health care provider.  If you were prescribed an antibiotic medicine, take it as told by your health care  provider. Do not stop taking the antibiotic even if you start to feel better. Incision care   Follow instructions from your health care provider about how to take care of your incision area. Make sure  you: ? Wash your hands with soap and water before you change your bandage (dressing). If soap and water are not available, use hand sanitizer. ? Change your dressing as told by your health care provider. ? Leave stitches (sutures), skin glue, or adhesive strips in place. These skin closures may need to stay in place for 2 weeks or longer. If adhesive strip edges start to loosen and curl up, you may trim the loose edges. Do not remove adhesive strips completely unless your health care provider tells you to do that.  Check your incision area every day for signs of infection. Check for: ? More redness, swelling, or pain. ? More fluid or blood. ? Warmth. ? Pus or a bad smell.  Do not use lotions or ointments near the incision area unless told by your health care provider.  Keep the incision area clean and dry for 2-3 days after the procedure or for as long as told by your health care provider. It takes several weeks for the incision site to heal completely.  Do not take baths, swim, or use a hot tub until your health care provider approves. Activity  Try to walk a little every day. Exercising is important after this procedure. Also, use your shoulder on the side of the defibrillator in daily tasks that do not require a lot of motion.  For at least 6 weeks: ? Do not lift your upper arm above your shoulders. This means no tennis, golf, or swimming for this period of time. If you tend to sleep with your arm above your head, use a restraint to prevent this during sleep. ? Avoid sudden jerking, pulling, or chopping movements that pull your upper arm far away from your body.  Ask your health care provider when you may go back to work.  Check with your health care provider before you start to drive  or play sports. Electric and magnetic fields  Tell all health care providers that you have a defibrillator. This may prevent them from giving you an MRI scan because strong magnets are used for that test.  If you must pass through a metal detector, quickly walk through it. Do not stop under the detector, and do not stand near it.  Avoid places or objects that have a strong electric or magnetic field, including: ? [employer]. At the airport, let officials know that you have a defibrillator. Your defibrillator ID card will let you be checked in a way that is safe for you and will not damage your defibrillator. Also, do not let a security person wave a magnetic wand near your defibrillator. That can make it stop working. ? Power plants. ? Large electrical generators. ? Anti-theft systems or electronic article surveillance (EAS). ? Radiofrequency transmission towers, such as cell phone and radio towers.  Do not use amateur (ham) radio equipment or electric (arc) welding torches. Some devices are safe to use if held at least 12 inches (30 cm) from your defibrillator. These include power tools, lawn mowers, and speakers. If you are unsure if something is safe to use, ask your health care provider.  Do not use MP3 player headphones. They have magnets.  You may safely use electric blankets, heating pads, computers, and microwave ovens.  When using your cell phone, hold it to the ear that is on the opposite side from the defibrillator. Do not leave your cell phone in a pocket over the defibrillator. General instructions  Follow diet instructions from your health care  provider, if this applies.  Always keep your defibrillator ID card with you. The card should list the implant date, device model, and manufacturer. Consider wearing a medical alert bracelet or necklace.  Have your defibrillator checked every 3-6 months or as often as told by your health care provider. Most defibrillators  last for 4-8 years.  Keep all follow-up visits as told by your health care provider. This is important for your health care provider to make sure your chest is healing the way it should. Ask your health care provider when you should come back to have your stitches or staples taken out. Contact a health care provider if:  You feel one shock in your chest.  You gain weight suddenly.  Your legs or feet swell more than they have before.  It feels like your heart is fluttering or skipping beats (heart palpitations).  You have more redness, swelling, or pain around your incision.  You have more fluid or blood coming from your incision.  Your incision feels warm to the touch.  You have pus or a bad smell coming from your incision.  You have a fever. Get help right away if:  You have chest pain.  You feel more than one shock.  You feel more short of breath than you have felt before.  You feel more light-headed than you have felt before.  Your incision starts to open up. This information is not intended to replace advice given to you by your health care provider. Make sure you discuss any questions you have with your health care provider. Document Released: 02/21/2005 Document Revised: 02/22/2016 Document Reviewed: 01/09/2016 Elsevier Interactive Patient Education  2018 [employer].     Supplemental Discharge Instructions for  Pacemaker/Defibrillator Patients  ACTIVITY No heavy lifting or vigorous activity with your left/right arm for 6 to 8 weeks.  Do not raise your left/right arm above your head for one week.  Gradually raise your affected arm as drawn below.           __  NO DRIVING for     ; you may begin driving on     .  WOUND CARE  Keep the wound area clean and dry.  Do not get this area wet for one week. No showers for one week; you may shower on     .  The tape/steri-strips on your wound will fall off; do not pull them off.  No bandage is needed on  the site.  DO  NOT apply any creams, oils, or ointments to the wound area.  If you notice any drainage or discharge from the wound, any swelling or bruising at the site, or you develop a fever > 101? F after you are discharged home, call the office at once.  SPECIAL INSTRUCTIONS  You are still able to use cellular telephones; use the ear opposite the side where you have your pacemaker/defibrillator.  Avoid carrying your cellular phone near your device.  When traveling through airports, show security personnel your identification card to avoid being screened in the metal detectors.  Ask the security personnel to use the hand wand.  Avoid arc welding equipment, MRI testing (magnetic resonance imaging), TENS units (transcutaneous nerve stimulators).  Call the office for questions about other devices.  Avoid electrical appliances that are in poor condition or are not properly grounded.  Microwave ovens are safe to be near or to operate.  ADDITIONAL INFORMATION FOR DEFIBRILLATOR PATIENTS SHOULD YOUR DEVICE GO OFF:  If your  device goes off ONCE and you feel fine afterward, notify the device clinic nurses.  If your device goes off ONCE and you do not feel well afterward, call 911.  If your device goes off TWICE, call 911.  If your device goes off THREE TIMES IN ONE DAY, call 911.  DO NOT DRIVE YOURSELF OR A FAMILY MEMBER WITH A DEFIBRILLATOR TO THE HOSPITAL-CALL 911.

## 2018-07-19 ENCOUNTER — Encounter (HOSPITAL_COMMUNITY): Payer: Self-pay | Admitting: Cardiology

## 2018-07-19 ENCOUNTER — Other Ambulatory Visit: Payer: Self-pay

## 2018-07-19 ENCOUNTER — Ambulatory Visit (HOSPITAL_COMMUNITY)
Admission: RE | Disposition: A | Discharge status: Home / Self Care | Payer: Self-pay | Source: Ambulatory Visit | Attending: Cardiology

## 2018-07-19 ENCOUNTER — Ambulatory Visit (HOSPITAL_COMMUNITY)
Admission: RE | Admit: 2018-07-19 | Discharge: 2018-07-20 | Disposition: A | Discharge status: Home / Self Care | Payer: 59 | Source: Ambulatory Visit | Attending: Cardiology | Admitting: Cardiology

## 2018-07-19 DIAGNOSIS — Z9889 Other specified postprocedural states: Secondary | ICD-10-CM | POA: Insufficient documentation

## 2018-07-19 DIAGNOSIS — Z006 Encounter for examination for normal comparison and control in clinical research program: Secondary | ICD-10-CM | POA: Insufficient documentation

## 2018-07-19 DIAGNOSIS — I2510 Atherosclerotic heart disease of native coronary artery without angina pectoris: Secondary | ICD-10-CM | POA: Diagnosis not present

## 2018-07-19 DIAGNOSIS — Z7984 Long term (current) use of oral hypoglycemic drugs: Secondary | ICD-10-CM | POA: Insufficient documentation

## 2018-07-19 DIAGNOSIS — Z87891 Personal history of nicotine dependence: Secondary | ICD-10-CM | POA: Diagnosis not present

## 2018-07-19 DIAGNOSIS — Z7982 Long term (current) use of aspirin: Secondary | ICD-10-CM | POA: Diagnosis not present

## 2018-07-19 DIAGNOSIS — Z79899 Other long term (current) drug therapy: Secondary | ICD-10-CM | POA: Diagnosis not present

## 2018-07-19 DIAGNOSIS — Z888 Allergy status to other drugs, medicaments and biological substances status: Secondary | ICD-10-CM | POA: Diagnosis not present

## 2018-07-19 DIAGNOSIS — G4733 Obstructive sleep apnea (adult) (pediatric): Secondary | ICD-10-CM | POA: Diagnosis not present

## 2018-07-19 DIAGNOSIS — E119 Type 2 diabetes mellitus without complications: Secondary | ICD-10-CM | POA: Diagnosis not present

## 2018-07-19 DIAGNOSIS — Z8249 Family history of ischemic heart disease and other diseases of the circulatory system: Secondary | ICD-10-CM | POA: Insufficient documentation

## 2018-07-19 DIAGNOSIS — Z95818 Presence of other cardiac implants and grafts: Secondary | ICD-10-CM

## 2018-07-19 DIAGNOSIS — Z885 Allergy status to narcotic agent status: Secondary | ICD-10-CM | POA: Insufficient documentation

## 2018-07-19 DIAGNOSIS — G2581 Restless legs syndrome: Secondary | ICD-10-CM | POA: Insufficient documentation

## 2018-07-19 DIAGNOSIS — I255 Ischemic cardiomyopathy: Secondary | ICD-10-CM

## 2018-07-19 DIAGNOSIS — I5022 Chronic systolic (congestive) heart failure: Secondary | ICD-10-CM | POA: Diagnosis not present

## 2018-07-19 DIAGNOSIS — Z7902 Long term (current) use of antithrombotics/antiplatelets: Secondary | ICD-10-CM | POA: Insufficient documentation

## 2018-07-19 DIAGNOSIS — I110 Hypertensive heart disease with heart failure: Secondary | ICD-10-CM | POA: Diagnosis not present

## 2018-07-19 DIAGNOSIS — Z955 Presence of coronary angioplasty implant and graft: Secondary | ICD-10-CM | POA: Diagnosis not present

## 2018-07-19 DIAGNOSIS — E785 Hyperlipidemia, unspecified: Secondary | ICD-10-CM | POA: Insufficient documentation

## 2018-07-19 HISTORY — PX: ICD IMPLANT: EP1208

## 2018-07-19 LAB — SURGICAL PCR SCREEN
MRSA, PCR: NEGATIVE
Staphylococcus aureus: NEGATIVE

## 2018-07-19 LAB — GLUCOSE, CAPILLARY
Glucose-Capillary: 118 mg/dL — ABNORMAL HIGH (ref 70–99)
Glucose-Capillary: 119 mg/dL — ABNORMAL HIGH (ref 70–99)
Glucose-Capillary: 191 mg/dL — ABNORMAL HIGH (ref 70–99)
Glucose-Capillary: 120 mg/dL — ABNORMAL HIGH (ref 70–99)
Glucose-Capillary: 128 mg/dL — ABNORMAL HIGH (ref 70–99)

## 2018-07-19 SURGERY — ICD IMPLANT

## 2018-07-19 MED ORDER — MUPIROCIN 2 % EX OINT
TOPICAL_OINTMENT | CUTANEOUS | Status: AC
  Administered 2018-07-19: 1 via NASAL
  Filled 2018-07-19: qty 22

## 2018-07-19 MED ORDER — SPIRONOLACTONE 12.5 MG HALF TABLET
12.5000 mg | ORAL_TABLET | Freq: Every day | ORAL | Status: DC
  Administered 2018-07-19 – 2018-07-20 (×2): 12.5 mg via ORAL
  Filled 2018-07-19 (×2): qty 1

## 2018-07-19 MED ORDER — CEFAZOLIN SODIUM-DEXTROSE 1-4 GM/50ML-% IV SOLN
1.0000 g | Freq: Four times a day (QID) | INTRAVENOUS | Status: AC
  Administered 2018-07-19 – 2018-07-20 (×3): 1 g via INTRAVENOUS
  Filled 2018-07-19 (×3): qty 50

## 2018-07-19 MED ORDER — MIDAZOLAM HCL 5 MG/5ML IJ SOLN
INTRAMUSCULAR | Status: AC
  Filled 2018-07-19: qty 5

## 2018-07-19 MED ORDER — LIDOCAINE HCL 1 % IJ SOLN
INTRAMUSCULAR | Status: AC
  Filled 2018-07-19: qty 60

## 2018-07-19 MED ORDER — CO Q 10 100 MG PO CAPS: 100.0000 mg | ORAL_CAPSULE | Freq: Every day | ORAL | Status: DC

## 2018-07-19 MED ORDER — CANAGLIFLOZIN 100 MG PO TABS
100.0000 mg | ORAL_TABLET | Freq: Every day | ORAL | Status: DC
  Administered 2018-07-20: 100 mg via ORAL
  Filled 2018-07-19: qty 1

## 2018-07-19 MED ORDER — SODIUM CHLORIDE 0.9 % IV SOLN
INTRAVENOUS | Status: AC
  Filled 2018-07-19: qty 2

## 2018-07-19 MED ORDER — CLOPIDOGREL BISULFATE 75 MG PO TABS
75.0000 mg | ORAL_TABLET | Freq: Every day | ORAL | Status: DC
  Administered 2018-07-19 – 2018-07-20 (×2): 75 mg via ORAL
  Filled 2018-07-19 (×2): qty 1

## 2018-07-19 MED ORDER — CHLORHEXIDINE GLUCONATE 4 % EX LIQD
60.0000 mL | Freq: Once | CUTANEOUS | Status: DC
  Filled 2018-07-19: qty 60

## 2018-07-19 MED ORDER — METFORMIN HCL 500 MG PO TABS
1000.0000 mg | ORAL_TABLET | Freq: Two times a day (BID) | ORAL | Status: DC
  Administered 2018-07-19 – 2018-07-20 (×2): 1000 mg via ORAL
  Filled 2018-07-19 (×2): qty 2

## 2018-07-19 MED ORDER — CEFAZOLIN SODIUM-DEXTROSE 2-4 GM/100ML-% IV SOLN
INTRAVENOUS | Status: AC
  Filled 2018-07-19: qty 100

## 2018-07-19 MED ORDER — SODIUM CHLORIDE 0.9 % IV SOLN
80.0000 mg | INTRAVENOUS | Status: AC
  Administered 2018-07-19: 80 mg
  Filled 2018-07-19: qty 2

## 2018-07-19 MED ORDER — SACUBITRIL-VALSARTAN 24-26 MG PO TABS
1.0000 | ORAL_TABLET | Freq: Two times a day (BID) | ORAL | Status: DC
  Administered 2018-07-19 – 2018-07-20 (×2): 1 via ORAL
  Filled 2018-07-19 (×2): qty 1

## 2018-07-19 MED ORDER — MAGNESIUM GLUCONATE 500 MG PO TABS
500.0000 mg | ORAL_TABLET | Freq: Two times a day (BID) | ORAL | Status: DC
  Administered 2018-07-19 – 2018-07-20 (×2): 500 mg via ORAL
  Filled 2018-07-19 (×2): qty 1

## 2018-07-19 MED ORDER — HEPARIN (PORCINE) IN NACL 1000-0.9 UT/500ML-% IV SOLN
INTRAVENOUS | Status: DC | PRN
  Administered 2018-07-19: 500 mL

## 2018-07-19 MED ORDER — PRAMIPEXOLE DIHYDROCHLORIDE 0.25 MG PO TABS
0.5000 mg | ORAL_TABLET | Freq: Four times a day (QID) | ORAL | Status: DC
  Administered 2018-07-19 – 2018-07-20 (×3): 0.5 mg via ORAL
  Filled 2018-07-19 (×6): qty 2

## 2018-07-19 MED ORDER — FENTANYL CITRATE (PF) 100 MCG/2ML IJ SOLN
INTRAMUSCULAR | Status: DC | PRN
  Administered 2018-07-19 (×2): 25 ug via INTRAVENOUS

## 2018-07-19 MED ORDER — PANTOPRAZOLE SODIUM 40 MG PO TBEC
40.0000 mg | DELAYED_RELEASE_TABLET | Freq: Every evening | ORAL | Status: DC
  Administered 2018-07-19: 40 mg via ORAL
  Filled 2018-07-19: qty 1

## 2018-07-19 MED ORDER — NITROGLYCERIN 0.4 MG SL SUBL: 0.4000 mg | SUBLINGUAL_TABLET | SUBLINGUAL | Status: DC | PRN

## 2018-07-19 MED ORDER — LIDOCAINE HCL 1 % IJ SOLN
INTRAMUSCULAR | Status: AC
  Filled 2018-07-19: qty 20

## 2018-07-19 MED ORDER — ONDANSETRON HCL 4 MG/2ML IJ SOLN: 4.0000 mg | Freq: Four times a day (QID) | INTRAMUSCULAR | Status: DC | PRN

## 2018-07-19 MED ORDER — INSULIN ASPART 100 UNIT/ML ~~LOC~~ SOLN: 0.0000 [IU] | Freq: Three times a day (TID) | SUBCUTANEOUS | Status: DC

## 2018-07-19 MED ORDER — ASPIRIN 81 MG PO CHEW
81.0000 mg | CHEWABLE_TABLET | Freq: Every evening | ORAL | Status: DC
  Administered 2018-07-19: 81 mg via ORAL
  Filled 2018-07-19 (×3): qty 1

## 2018-07-19 MED ORDER — ACETAMINOPHEN 325 MG PO TABS
325.0000 mg | ORAL_TABLET | ORAL | Status: DC | PRN
  Administered 2018-07-19 – 2018-07-20 (×3): 650 mg via ORAL
  Filled 2018-07-19 (×3): qty 2

## 2018-07-19 MED ORDER — SODIUM CHLORIDE 0.9 % IV SOLN
INTRAVENOUS | Status: DC
  Administered 2018-07-19: 06:00:00 via INTRAVENOUS

## 2018-07-19 MED ORDER — FENTANYL CITRATE (PF) 100 MCG/2ML IJ SOLN
INTRAMUSCULAR | Status: AC
  Filled 2018-07-19: qty 2

## 2018-07-19 MED ORDER — ALBUTEROL SULFATE (2.5 MG/3ML) 0.083% IN NEBU: 2.5000 mg | INHALATION_SOLUTION | Freq: Four times a day (QID) | RESPIRATORY_TRACT | Status: DC | PRN

## 2018-07-19 MED ORDER — CEFAZOLIN SODIUM-DEXTROSE 2-4 GM/100ML-% IV SOLN
2.0000 g | INTRAVENOUS | Status: AC
  Administered 2018-07-19: 2 g via INTRAVENOUS
  Filled 2018-07-19: qty 100

## 2018-07-19 MED ORDER — LIDOCAINE HCL (PF) 1 % IJ SOLN
INTRAMUSCULAR | Status: DC | PRN
  Administered 2018-07-19: 60 mL

## 2018-07-19 MED ORDER — INSULIN DEGLUDEC 200 UNIT/ML ~~LOC~~ SOPN: 10.0000 [IU] | PEN_INJECTOR | Freq: Every day | SUBCUTANEOUS | Status: DC

## 2018-07-19 MED ORDER — HEPARIN (PORCINE) IN NACL 1000-0.9 UT/500ML-% IV SOLN
INTRAVENOUS | Status: AC
  Filled 2018-07-19: qty 500

## 2018-07-19 MED ORDER — INSULIN GLARGINE 100 UNIT/ML ~~LOC~~ SOLN: 10.0000 [IU] | Freq: Every day | SUBCUTANEOUS | Status: DC

## 2018-07-19 MED ORDER — ALBUTEROL SULFATE HFA 108 (90 BASE) MCG/ACT IN AERS: 1.0000 | INHALATION_SPRAY | Freq: Four times a day (QID) | RESPIRATORY_TRACT | Status: DC | PRN

## 2018-07-19 MED ORDER — CARVEDILOL 3.125 MG PO TABS
3.1250 mg | ORAL_TABLET | Freq: Two times a day (BID) | ORAL | Status: DC
  Administered 2018-07-19 – 2018-07-20 (×2): 3.125 mg via ORAL
  Filled 2018-07-19 (×2): qty 1

## 2018-07-19 MED ORDER — MIDAZOLAM HCL 5 MG/5ML IJ SOLN
INTRAMUSCULAR | Status: DC | PRN
  Administered 2018-07-19 (×2): 1 mg via INTRAVENOUS

## 2018-07-19 SURGICAL SUPPLY — 7 items
CABLE SURGICAL S-101-97-12 (CABLE) ×2 IMPLANT
ICD VISIA MRI VR DVFB1D4 (ICD Generator) ×1 IMPLANT
LEAD SPRINT QUAT SEC 6935M-62 (Lead) ×2 IMPLANT
PAD PRO RADIOLUCENT 2001M-C (PAD) ×2 IMPLANT
SHEATH CLASSIC 9F (SHEATH) ×2 IMPLANT
TRAY PACEMAKER INSERTION (PACKS) ×2 IMPLANT
VISIA MRI VR DVFB1D4 (ICD Generator) ×2 IMPLANT

## 2018-07-19 NOTE — Discharge Instructions (Signed)
° ° °  Supplemental Discharge Instructions for  Pacemaker/Defibrillator Patients  Activity No heavy lifting or vigorous activity with your left/right arm for 6 to 8 weeks.  Do not raise your left/right arm above your head for one week.  Gradually raise your affected arm as drawn below.             07/23/18                     07/24/18                    07/25/18                   07/26/18 __  NO DRIVING for  1 week   ; you may begin driving on   90/2/40  .  WOUND CARE - Keep the wound area clean and dry.  Do not get this area wet, no showers until after your wound check visit . - The tape/steri-strips on your wound will fall off; do not pull them off.  No bandage is needed on the site.  DO  NOT apply any creams, oils, or ointments to the wound area. - If you notice any drainage or discharge from the wound, any swelling or bruising at the site, or you develop a fever > 101? F after you are discharged home, call the office at once.  Special Instructions - You are still able to use cellular telephones; use the ear opposite the side where you have your pacemaker/defibrillator.  Avoid carrying your cellular phone near your device. - When traveling through airports, show security personnel your identification card to avoid being screened in the metal detectors.  Ask the security personnel to use the hand wand. - Avoid arc welding equipment, MRI testing (magnetic resonance imaging), TENS units (transcutaneous nerve stimulators).  Call the office for questions about other devices. - Avoid electrical appliances that are in poor condition or are not properly grounded. - Microwave ovens are safe to be near or to operate.  Additional information for defibrillator patients should your device go off: - If your device goes off ONCE and you feel fine afterward, notify the device clinic nurses. - If your device goes off ONCE and you do not feel well afterward, call 911. - If your device goes off TWICE, call  911. - If your device goes off THREE times in one day, call 911.  DO NOT DRIVE YOURSELF OR A FAMILY MEMBER WITH A DEFIBRILLATOR TO THE HOSPITAL--CALL 911.

## 2018-07-19 NOTE — H&P (Signed)
ICD Criteria  Current LVEF:25-30%. Within 12 months prior to implant: Yes   Heart failure history: Yes, Class II  Cardiomyopathy history: Yes, Ischemic Cardiomyopathy - Prior MI.  Atrial Fibrillation/Atrial Flutter: No.  Ventricular tachycardia history: No.  Cardiac arrest history: No.  History of syndromes with risk of sudden death: No.  Previous ICD: No.  Current ICD indication: Primary  PPM indication: No.  Class I or II Bradycardia indication present: No  Beta Blocker therapy for 3 or more months: Yes, prescribed.   Ace Inhibitor/ARB therapy for 3 or more months: Yes, prescribed.    I have seen [name] is a 62 y.o. malepre-procedural and has been referred by Corine Shelter for consideration of ICD implant for primary prevention of sudden death.  The patient's chart has been reviewed and they meet criteria for ICD implant.  I have had a thorough discussion with the patient reviewing options.  The patient and their family (if available) have had opportunities to ask questions and have them answered. The patient and I have decided together through the [facility] Share Decision Support Tool to implant ICD at this time.  Risks, benefits, alternatives to ICD implantation were discussed in detail with the patient today. The patient  understands that the risks include but are not limited to bleeding, infection, pneumothorax, perforation, tamponade, vascular damage, renal failure, MI, stroke, death, inappropriate shocks, and lead dislodgement and  wishes to proceed.

## 2018-07-19 NOTE — Discharge Summary (Addendum)
ELECTROPHYSIOLOGY PROCEDURE DISCHARGE SUMMARY    Patient ID: TC ALFARO,  MRN: [id number], DOB/AGE: 01/02/1956 62 y.o.  Admit date: 07/19/2018 Discharge date: 07/20/2018  Primary Care Physician: Hal Morales, NP Primary Cardiologist: Dr. Antoine Poche Electrophysiologist: Dr. Elberta Fortis  Primary Discharge Diagnosis:  1. ICM  Secondary Discharge Diagnosis:  1. CAD 2. HTN 3. DM 4. OSA  Allergies  Allergen Reactions  . Brilinta [Ticagrelor] Other (See Comments)    Intolerable dyspnea-changed to Plavix  . Codeine Other (See Comments)    Lightheaded, vision issues  . Lipitor [Atorvastatin Calcium]   . Niaspan [Niacin Er]      Procedures This Admission:  1.  Implantation of a MDT single chamber ICD on 07/19/18 by Dr Dr. Elberta Fortis.  The patient received a Medtronic Visia AF MRI SureScan (serial  Number [id number]) ICD, Medtronic, model [id number] (serial number [id number]) right ventricular defibrillator lead  DFT's were deferred at time of implant.   There were no immediate post procedure complications. 2.  CXR on 07/20/18 demonstrated no pneumothorax status post device implantation.   Brief HPI: [name] is a 62 y.o. male was referred to electrophysiology in the outpatient setting for consideration of ICD implantation.  Past medical history includes above.  The patient has persistent LV dysfunction despite guideline directed therapy.  Risks, benefits, and alternatives to ICD implantation were reviewed with the patient who wished to proceed.   Hospital Course:  The patient was admitted and underwent implantation of an ICD with details as outlined above.  He was monitored on telemetry overnight which demonstrated sinus rhythm with frequent PVC's.  Left chest was without hematoma or ecchymosis.  The device was interrogated and found to be functioning normally.  CXR was obtained and demonstrated no pneumothorax status post device implantation.  Wound care, arm mobility, and  restrictions were reviewed with the patient.  He is feeling well this morning, no CP or SOB, minimal discomfort at implant site.  The patient was examined by Dr. Elberta Fortis and considered stable for discharge to home.   The patient's discharge medications include an ARB (Entresto) and beta blocker (carvedilol).   Physical Exam: Vitals:   07/19/18 2035 07/19/18 2117 07/19/18 2359 07/20/18 0329  BP: 104/80 102/66 124/72 120/73  Pulse: 62 61 62 (!) 57  Resp: 18  18 18   Temp: 97.6 F (36.4 C)  97.7 F (36.5 C) 97.7 F (36.5 C)  TempSrc: Oral  Oral Oral  SpO2: 96%  93% 92%  Weight:    94.2 kg  Height:        GEN- The patient is well appearing, alert and oriented x 3 today.   HEENT: normocephalic, atraumatic; sclera clear, conjunctiva pink; hearing intact; oropharynx clear Lungs-  CTA b/l, normal work of breathing.  No wheezes, rales, rhonchi Heart-  RRR, no murmurs, rubs or gallops GI- soft, non-tender, non-distended Extremities- no clubbing, cyanosis, or edema MS- no significant deformity or atrophy Skin- warm and dry, no rash or lesion, left chest without hematoma/ecchymosis Psych- euthymic mood, full affect Neuro- no gross defecits  Labs:   Lab Results  Component Value Date   WBC 11.1 (H) 07/06/2018   HGB 16.4 07/06/2018   HCT 48.4 07/06/2018   MCV 93 07/06/2018   PLT 156 07/06/2018   No results for input(s): NA, K, CL, CO2, BUN, CREATININE, CALCIUM, PROT, BILITOT, ALKPHOS, ALT, AST, GLUCOSE in the last 168 hours.  Invalid input(s): LABALBU  Discharge Medications:  Allergies as of 07/20/2018  Reactions   Brilinta [ticagrelor] Other (See Comments)   Intolerable dyspnea-changed to Plavix   Codeine Other (See Comments)   Lightheaded, vision issues   Lipitor [atorvastatin Calcium]    Niaspan [niacin Er]       Medication List    TAKE these medications   acetaminophen 500 MG tablet Commonly known as:  TYLENOL Take 500 mg by mouth every 6 (six) hours as needed (for  headaches.).   albuterol 108 (90 Base) MCG/ACT inhaler Commonly known as:  PROVENTIL HFA;VENTOLIN HFA Inhale 1-2 puffs into the lungs every 6 (six) hours as needed for wheezing or shortness of breath.   aspirin 81 MG tablet Take 81 mg by mouth every evening.   carvedilol 3.125 MG tablet Commonly known as:  COREG Take 1 tablet (3.125 mg total) by mouth 2 (two) times daily with a meal.   CINNAMON PO Take 1,000 mg by mouth daily.   clopidogrel 75 MG tablet Commonly known as:  PLAVIX Take 1 tablet (75 mg total) by mouth daily.   Co Q 10 100 MG Caps Take 100 mg by mouth daily.   JARDIANCE 10 MG Tabs tablet Generic drug:  empagliflozin Take 10 mg by mouth daily.   magnesium gluconate 500 MG tablet Commonly known as:  MAGONATE Take 500 mg by mouth 2 (two) times daily.   metFORMIN 1000 MG tablet Commonly known as:  GLUCOPHAGE Take 1,000 mg by mouth 2 (two) times daily.   nitroGLYCERIN 0.4 MG SL tablet Commonly known as:  NITROSTAT Place 1 tablet (0.4 mg total) under the tongue every 5 (five) minutes as needed for chest pain.   pantoprazole 40 MG tablet Commonly known as:  PROTONIX Take 1 tablet (40 mg total) by mouth every evening.   pramipexole 1 MG tablet Commonly known as:  MIRAPEX Take 0.5 mg by mouth every 6 (six) hours.   sacubitril-valsartan 24-26 MG Commonly known as:  ENTRESTO Take 1 tablet by mouth 2 (two) times daily.   spironolactone 25 MG tablet Commonly known as:  ALDACTONE Take 0.5 tablets (12.5 mg total) by mouth daily.   TRESIBA FLEXTOUCH 200 UNIT/ML Sopn Generic drug:  Insulin Degludec Inject 10 Units into the skin at bedtime.   VOLTAREN 1 % Gel Generic drug:  diclofenac sodium Apply 2 g topically daily. Applied to heel for pain.       Disposition:  Home  Discharge Instructions    Diet - low sodium heart healthy   Complete by:  As directed    Increase activity slowly   Complete by:  As directed      Follow-up Information    [facility]  [facility] Office Follow up.   Specialty:  Cardiology Why:  07/29/18 @ 10:00AM Contact information: [address] [place] [address] [phone]       Regan Lemming, MD Follow up.   Specialty:  Cardiology Why:  10/22/18 @ 9:45AM Contact information: [address] [place] [address] [phone]           Duration of Discharge Encounter: Greater than 30 minutes including physician time.  Signed, Gypsy Balsam, NP 07/20/2018 7:26 AM   I have seen and examined this patient with Gypsy Balsam.  Agree with above, note added to reflect my findings.  On exam, RRR, no murmurs, lungs clear.  Patient mid to the hospital for ICD implant for primary prevention.  Device interrogation and chest x-ray without major abnormality.  Plan for discharge  today with follow-up in clinic.  Hildred Mollica M. Lashunta Frieden MD 07/20/2018 7:41 AM

## 2018-07-20 ENCOUNTER — Ambulatory Visit (HOSPITAL_COMMUNITY): Payer: 59

## 2018-07-20 DIAGNOSIS — I255 Ischemic cardiomyopathy: Secondary | ICD-10-CM | POA: Diagnosis not present

## 2018-07-20 DIAGNOSIS — Z95810 Presence of automatic (implantable) cardiac defibrillator: Secondary | ICD-10-CM | POA: Diagnosis not present

## 2018-07-20 DIAGNOSIS — Z006 Encounter for examination for normal comparison and control in clinical research program: Secondary | ICD-10-CM | POA: Diagnosis not present

## 2018-07-20 DIAGNOSIS — I5022 Chronic systolic (congestive) heart failure: Secondary | ICD-10-CM | POA: Diagnosis not present

## 2018-07-20 DIAGNOSIS — I110 Hypertensive heart disease with heart failure: Secondary | ICD-10-CM | POA: Diagnosis not present

## 2018-07-20 LAB — GLUCOSE, CAPILLARY: Glucose-Capillary: 112 mg/dL — ABNORMAL HIGH (ref 70–99)

## 2018-07-20 MED FILL — Lidocaine HCl Local Inj 1%: INTRAMUSCULAR | Qty: 60 | Status: AC

## 2018-07-20 NOTE — Progress Notes (Signed)
Pt discharge instructions reviewed with pt and family. Pt and family verbalize understanding and state they have no questions. Pt's belongings with pt. Pt is not in distress. Pt's wife is driving him home. Pt discharged via wheelchair.

## 2018-07-20 NOTE — Plan of Care (Signed)
  Problem: Education: Goal: Knowledge of General Education information will improve Description: Including pain rating scale, medication(s)/side effects and non-pharmacologic comfort measures Outcome: Progressing   Problem: Clinical Measurements: Goal: Ability to maintain clinical measurements within normal limits will improve Outcome: Progressing   Problem: Clinical Measurements: Goal: Cardiovascular complication will be avoided Outcome: Progressing   Problem: Activity: Goal: Risk for activity intolerance will decrease Outcome: Progressing   

## 2018-07-29 ENCOUNTER — Ambulatory Visit (INDEPENDENT_AMBULATORY_CARE_PROVIDER_SITE_OTHER): Payer: 59 | Admitting: *Deleted

## 2018-07-29 DIAGNOSIS — I255 Ischemic cardiomyopathy: Secondary | ICD-10-CM

## 2018-07-29 NOTE — Progress Notes (Signed)
Wound check appointment. Steri-strips removed. Wound without redness or edema. Incision edges approximated, wound well healed. Normal device function. Threshold, sensing, and impedances consistent with implant measurements. Device programmed at 3.5V for extra safety margin until 3 month visit. Histogram distribution appropriate for patient and level of activity. 1 AF episode EGM unavailable. No ventricular arrhythmias noted. Patient educated about wound care, arm mobility, lifting restrictions, shock plan. ROV in 3 months with implanting physician.

## 2018-08-04 DIAGNOSIS — E119 Type 2 diabetes mellitus without complications: Secondary | ICD-10-CM | POA: Diagnosis not present

## 2018-09-06 ENCOUNTER — Other Ambulatory Visit: Payer: Self-pay | Admitting: Cardiology

## 2018-09-11 ENCOUNTER — Other Ambulatory Visit: Payer: Self-pay | Admitting: Cardiology

## 2018-09-14 ENCOUNTER — Other Ambulatory Visit: Payer: Self-pay | Admitting: Cardiology

## 2018-09-14 ENCOUNTER — Other Ambulatory Visit: Payer: Self-pay | Admitting: *Deleted

## 2018-09-14 MED ORDER — SPIRONOLACTONE 25 MG PO TABS: 12.5000 mg | ORAL_TABLET | Freq: Every day | ORAL | 0 refills | Status: DC

## 2018-09-14 MED ORDER — CARVEDILOL 3.125 MG PO TABS: 3.1250 mg | ORAL_TABLET | Freq: Two times a day (BID) | ORAL | 0 refills | Status: DC

## 2018-09-14 MED ORDER — SACUBITRIL-VALSARTAN 24-26 MG PO TABS: 1.0000 | ORAL_TABLET | Freq: Two times a day (BID) | ORAL | 0 refills | Status: DC

## 2018-09-14 MED ORDER — CLOPIDOGREL BISULFATE 75 MG PO TABS: 75.0000 mg | ORAL_TABLET | Freq: Every day | ORAL | 0 refills | Status: DC

## 2018-09-14 NOTE — Telephone Encounter (Signed)
New Message    *STAT* If patient is at the pharmacy, call can be transferred to refill team.   1. Which medications need to be refilled? (please list name of each medication and dose if known) Clopidogrel 75mg , Carvedilol 3.125mg , Spironolactone 25mg , and Entresto 2426mg   2. Which pharmacy/location (including street and city if local pharmacy) is medication to be sent to? [facility] fax# [phone]   3. Do they need a 30 day or 90 day supply? 90 day supply

## 2018-09-24 ENCOUNTER — Telehealth: Payer: Self-pay | Admitting: Cardiology

## 2018-09-24 NOTE — Telephone Encounter (Signed)
New Message         Patient's wife is calling to get information about the patient heart condition such as his stent etc. Per patient it is ok to speak to his wife and give her the information she is seeking.

## 2018-09-24 NOTE — Telephone Encounter (Signed)
Information given as requested ./cy

## 2018-10-22 ENCOUNTER — Ambulatory Visit (INDEPENDENT_AMBULATORY_CARE_PROVIDER_SITE_OTHER): Payer: PRIVATE HEALTH INSURANCE | Admitting: Cardiology

## 2018-10-22 ENCOUNTER — Encounter: Payer: Self-pay | Admitting: Cardiology

## 2018-10-22 VITALS — BP 124/82 | HR 63 | Ht 70.0 in | Wt 221.0 lb

## 2018-10-22 DIAGNOSIS — I255 Ischemic cardiomyopathy: Secondary | ICD-10-CM | POA: Diagnosis not present

## 2018-10-22 NOTE — Progress Notes (Signed)
Electrophysiology Office Note   Date:  10/22/2018   ID:  Ramos, Steve 1956-02-26, MRN [id number]  PCP:  Hal Morales, NP  Cardiologist:  Antoine Poche Primary Electrophysiologist:  Virginio Isidore [name], MD    No chief complaint on file.    History of Present Illness: [name] is a 63 y.o. male who is being seen today for the evaluation of ischemic cardiomyopathy at the request of Angelina Sheriff. Presenting today for electrophysiology evaluation.  He has a history of coronary artery disease, hypertension, hyperlipidemia, OSA, and type 2 diabetes.  He has had multiple stents placed in the past.  He presents today for possible ICD.  Comes today or shortness of breath with exertion.  He feels well at rest.  Today, denies symptoms of palpitations, chest pain, shortness of breath, orthopnea, PND, lower extremity edema, claudication, dizziness, presyncope, syncope, bleeding, or neurologic sequela. The patient is tolerating medications without difficulties.  Overall he is doing well.  He has no chest pain or shortness of breath.  He does have some dizziness upon standing at times.  There are also some days that he has fatigue and shortness of breath.  He feels that all this is related to his heart failure.   Past Medical History:  Diagnosis Date  . ASCVD (arteriosclerotic cardiovascular disease)    a. MI 2004, did not seek care at that time. b. s/p PTCA/BMS to OM1 04/2011 (known totally occluded RCA with L-R collaterals and failed angioplasty on that vessel). c.)  Cath 2019 chronically occluded right coronary artery with collateralization.  90% circumflex stenosis treated with a DES.  Marland Kitchen Chronic bronchitis (HCC)   . HTN (hypertension)   . Hyperlipidemia   . Ischemic cardiomyopathy    EF 30%.    . OSA (obstructive sleep apnea) 05/24/11  . Restless leg syndrome 06/25/2011  . Type 2 diabetes mellitus (HCC)    Past Surgical History:  Procedure Laterality Date  . CARDIAC CATHETERIZATION   11/24/2012   40-50% ostial LAD, 40% mid LAD, 60-70% ostial D1, 40% proximal LCx, patent OM branch stent, 99% serial proximal-mid RCA lesions, 100% mid RCA occlusion (chronic with left to right collateralization); EF 40%, inferior wall hypokinesis.  Marland Kitchen CARDIAC CATHETERIZATION N/A 05/31/2015   Procedure: Left Heart Cath and Coronary Angiography;  Surgeon: Kathleene Hazel, MD;  Location: [facility] INVASIVE CV LAB;  Service: Cardiovascular;  Laterality: N/A;  . CORONARY STENT INTERVENTION N/A 12/25/2017   Procedure: CORONARY STENT INTERVENTION;  Surgeon: Swaziland, Peter M, MD;  Location: [facility] INVASIVE CV LAB;  Service: Cardiovascular;  Laterality: N/A;  . CORONARY STENT PLACEMENT  04/2011  . CYSTECTOMY     Spine and jaw  . ICD IMPLANT N/A 07/19/2018   Procedure: ICD IMPLANT;  Surgeon: Regan Lemming, MD;  Location: [facility] INVASIVE CV LAB;  Service: Cardiovascular;  Laterality: N/A;  . LEFT HEART CATH AND CORONARY ANGIOGRAPHY N/A 12/25/2017   Procedure: LEFT HEART CATH AND CORONARY ANGIOGRAPHY;  Surgeon: Swaziland, Peter M, MD;  Location: [facility] INVASIVE CV LAB;  Service: Cardiovascular;  Laterality: N/A;  . LEFT HEART CATHETERIZATION WITH CORONARY ANGIOGRAM N/A 11/24/2012   Procedure: LEFT HEART CATHETERIZATION WITH CORONARY ANGIOGRAM;  Surgeon: Kathleene Hazel, MD;  Location: [facility] CATH LAB;  Service: Cardiovascular;  Laterality: N/A;  . lesion removed from foot     right foot  . TONSILLECTOMY AND ADENOIDECTOMY       Current Outpatient Medications  Medication Sig Dispense Refill  . acetaminophen (TYLENOL) 500 MG  tablet Take 500 mg by mouth every 6 (six) hours as needed (for headaches.).    Marland Kitchen albuterol (PROVENTIL HFA;VENTOLIN HFA) 108 (90 Base) MCG/ACT inhaler Inhale 1-2 puffs into the lungs every 6 (six) hours as needed for wheezing or shortness of breath.    Marland Kitchen aspirin 81 MG tablet Take 81 mg by mouth every evening.     . carvedilol (COREG) 3.125 MG tablet Take 1 tablet (3.125 mg total) by mouth 2 (two)  times daily with a meal. 180 tablet 0  . CINNAMON PO Take 1,000 mg by mouth daily.    . clopidogrel (PLAVIX) 75 MG tablet Take 1 tablet (75 mg total) by mouth daily. 90 tablet 0  . Coenzyme Q10 (CO Q 10) 100 MG CAPS Take 100 mg by mouth daily.    Marland Kitchen JARDIANCE 10 MG TABS tablet Take 10 mg by mouth daily.    . magnesium gluconate (MAGONATE) 500 MG tablet Take 500 mg by mouth 2 (two) times daily.     . metFORMIN (GLUCOPHAGE) 1000 MG tablet Take 1,000 mg by mouth 2 (two) times daily.    . nitroGLYCERIN (NITROSTAT) 0.4 MG SL tablet Place 1 tablet (0.4 mg total) under the tongue every 5 (five) minutes as needed for chest pain. 30 tablet 1  . pantoprazole (PROTONIX) 40 MG tablet Take 1 tablet (40 mg total) by mouth every evening. 30 tablet 0  . pramipexole (MIRAPEX) 1 MG tablet Take 0.5 mg by mouth every 6 (six) hours.     . sacubitril-valsartan (ENTRESTO) 24-26 MG Take 1 tablet by mouth 2 (two) times daily. 180 tablet 0  . spironolactone (ALDACTONE) 25 MG tablet Take 0.5 tablets (12.5 mg total) by mouth daily. 45 tablet 0  . TOUJEO SOLOSTAR 300 UNIT/ML SOPN Inject 20 Units into the skin daily.    . VOLTAREN 1 % GEL Apply 2 g topically daily. Applied to heel for pain.  1   No current facility-administered medications for this visit.     Allergies:   Brilinta [ticagrelor]; Codeine; Lipitor [atorvastatin calcium]; and Niaspan [niacin er]   Social History:  The patient  reports that he quit smoking about 13 years ago. His smoking use included cigarettes. He has a 52.50 pack-year smoking history. He has never used smokeless tobacco. He reports that he does not drink alcohol or use drugs.   Family History:  The patient's family history includes Heart attack (age of onset: 9) in his brother; Heart attack (age of onset: 31) in his father; Lymphoma in his mother.   ROS:  Please see the history of present illness.   Otherwise, review of systems is positive for dizziness.   All other systems are reviewed and  negative.   PHYSICAL EXAM: VS:  BP 124/82   Pulse 63   Ht 5\' 10"  (1.778 m)   Wt 221 lb (100.2 kg)   SpO2 92%   BMI 31.71 kg/m  , BMI Body mass index is 31.71 kg/m. GEN: Well nourished, well developed, in no acute distress  HEENT: normal  Neck: no JVD, carotid bruits, or masses Cardiac: RRR; no murmurs, rubs, or gallops,no edema  Respiratory:  clear to auscultation bilaterally, normal work of breathing GI: soft, nontender, nondistended, + BS MS: no deformity or atrophy  Skin: warm and dry, device site well healed Neuro:  Strength and sensation are intact Psych: euthymic mood, full affect  EKG:  EKG is ordered today. Personal review of the ekg ordered shows SR, PVC, inferior Q waves, rate  71  Personal review of the device interrogation today. Results in [facility]    Recent Labs: 12/24/2017: ALT 46; B Natriuretic Peptide 26.5; Magnesium 1.9; TSH 1.956 01/21/2018: NT-Pro BNP 39 07/06/2018: BUN 18; Creatinine, Ser 0.98; Hemoglobin 16.4; Platelets 156; Potassium 4.4; Sodium 137    Lipid Panel     Component Value Date/Time   CHOL 127 12/25/2017 0308   TRIG 128 12/25/2017 0308   HDL 33 (L) 12/25/2017 0308   CHOLHDL 3.8 12/25/2017 0308   VLDL 26 12/25/2017 0308   LDLCALC 68 12/25/2017 0308     Wt Readings from Last 3 Encounters:  10/22/18 221 lb (100.2 kg)  07/20/18 207 lb 9.6 oz (94.2 kg)  07/06/18 214 lb (97.1 kg)      Other studies Reviewed: Additional studies/ records that were reviewed today include: TTE 03/15/18  Review of the above records today demonstrates:  - Left ventricle: The cavity size was mildly dilated. Wall   thickness was normal. Systolic function was severely reduced. The   estimated ejection fraction was in the range of 25% to 30%.   Diffuse hypokinesis. There is akinesis of the inferolateral   myocardium. Doppler parameters are consistent with abnormal left   ventricular relaxation (grade 1 diastolic dysfunction). - Right ventricle: The cavity size  was mildly dilated. - Right atrium: The atrium was mildly dilated.  LHC 12/25/17  Prox RCA lesion is 99% stenosed.  Prox RCA to Mid RCA lesion is 90% stenosed.  Mid RCA lesion is 100% stenosed.  Prox LAD lesion is 40% stenosed.  Ost 1st Diag lesion is 90% stenosed.  Prox LAD to Mid LAD lesion is 10% stenosed.  Previously placed Ost 1st Mrg to 1st Mrg stent (unknown type) is widely patent.  Ost Cx to Prox Cx lesion is 90% stenosed.  Post intervention, there is a 0% residual stenosis.  A drug-eluting stent was successfully placed using a STENT SYNERGY DES 3.5X12.  Non-stenotic Prox Cx to Mid Cx lesion.  LV end diastolic pressure is normal.   ASSESSMENT AND PLAN:  1.  Coronary artery disease: Status post multiple stents placed.  Current chest pain.  Continue with current management.  2.  Ischemic cardiomyopathy: He on optimal medical therapy.  Status post Medtronic ICD 09/29/2017.  Scheduled for ICD implant 07/19/18.  Device functioning appropriately.  He is having some dizziness upon standing.  I discussed with him the likelihood of this being orthostatic in nature.  He Sabella Traore take precautionary measures.  3.  Hypertension: Currently well controlled  Current medicines are reviewed at length with the patient today.   The patient does not have concerns regarding his medicines.  The following changes were made today: None  Labs/ tests ordered today include:  Orders Placed This Encounter  Procedures  . EKG 12-Lead    Disposition:   FU with Zaya Kessenich 9 months  Signed, Kastin Cerda [name], MD  10/22/2018 4:23 PM     [facility] [address] [place] [address] [phone] (office) [phone] (fax)

## 2018-10-22 NOTE — Patient Instructions (Signed)
Medication Instructions:  Your physician recommends that you continue on your current medications as directed. Please refer to the Current Medication list given to you today.  * If you need a refill on your cardiac medications before your next appointment, please call your pharmacy.   Labwork: None ordered  Testing/Procedures: None ordered  Follow-Up: Remote monitoring is used to monitor your Pacemaker of ICD from home. This monitoring reduces the number of office visits required to check your device to one time per year. It allows [place] to keep an eye on the functioning of your device to ensure it is working properly. You are scheduled for a device check from home on 01/24/2019. You may send your transmission at any time that day. If you have a wireless device, the transmission will be sent automatically. After your physician reviews your transmission, you will receive a postcard with your next transmission date.  Your physician wants you to follow-up in: 9 months with Dr. Elberta Fortis.  You will receive a reminder letter in the mail two months in advance. If you don't receive a letter, please call our office to schedule the follow-up appointment.   Thank you for choosing [facility]!!   Dory Horn, RN [phone]

## 2018-10-26 LAB — CUP PACEART INCLINIC DEVICE CHECK
Date Time Interrogation Session: 20200310085335
Implantable Lead Implant Date: 20191202
Implantable Lead Location: 753860
Implantable Pulse Generator Implant Date: 20191202

## 2019-01-04 ENCOUNTER — Inpatient Hospital Stay (HOSPITAL_COMMUNITY)
Admission: EM | Admit: 2019-01-04 | Discharge: 2019-01-06 | DRG: 287 | Disposition: A | Discharge status: Home / Self Care | Payer: PRIVATE HEALTH INSURANCE | Attending: Interventional Cardiology | Admitting: Interventional Cardiology

## 2019-01-04 ENCOUNTER — Encounter (HOSPITAL_COMMUNITY): Payer: Self-pay

## 2019-01-04 ENCOUNTER — Telehealth: Payer: Self-pay

## 2019-01-04 ENCOUNTER — Other Ambulatory Visit: Payer: Self-pay

## 2019-01-04 ENCOUNTER — Inpatient Hospital Stay (HOSPITAL_COMMUNITY): Payer: PRIVATE HEALTH INSURANCE

## 2019-01-04 ENCOUNTER — Emergency Department (HOSPITAL_COMMUNITY): Payer: PRIVATE HEALTH INSURANCE

## 2019-01-04 DIAGNOSIS — E785 Hyperlipidemia, unspecified: Secondary | ICD-10-CM | POA: Diagnosis present

## 2019-01-04 DIAGNOSIS — J42 Unspecified chronic bronchitis: Secondary | ICD-10-CM | POA: Diagnosis present

## 2019-01-04 DIAGNOSIS — I25811 Atherosclerosis of native coronary artery of transplanted heart without angina pectoris: Secondary | ICD-10-CM | POA: Diagnosis not present

## 2019-01-04 DIAGNOSIS — I252 Old myocardial infarction: Secondary | ICD-10-CM | POA: Diagnosis not present

## 2019-01-04 DIAGNOSIS — Z8249 Family history of ischemic heart disease and other diseases of the circulatory system: Secondary | ICD-10-CM | POA: Diagnosis not present

## 2019-01-04 DIAGNOSIS — I5022 Chronic systolic (congestive) heart failure: Secondary | ICD-10-CM

## 2019-01-04 DIAGNOSIS — E119 Type 2 diabetes mellitus without complications: Secondary | ICD-10-CM | POA: Diagnosis present

## 2019-01-04 DIAGNOSIS — Z7982 Long term (current) use of aspirin: Secondary | ICD-10-CM | POA: Diagnosis not present

## 2019-01-04 DIAGNOSIS — I509 Heart failure, unspecified: Secondary | ICD-10-CM | POA: Diagnosis present

## 2019-01-04 DIAGNOSIS — G4733 Obstructive sleep apnea (adult) (pediatric): Secondary | ICD-10-CM | POA: Diagnosis present

## 2019-01-04 DIAGNOSIS — I25110 Atherosclerotic heart disease of native coronary artery with unstable angina pectoris: Secondary | ICD-10-CM | POA: Diagnosis present

## 2019-01-04 DIAGNOSIS — Z955 Presence of coronary angioplasty implant and graft: Secondary | ICD-10-CM | POA: Diagnosis not present

## 2019-01-04 DIAGNOSIS — I361 Nonrheumatic tricuspid (valve) insufficiency: Secondary | ICD-10-CM | POA: Diagnosis not present

## 2019-01-04 DIAGNOSIS — Z807 Family history of other malignant neoplasms of lymphoid, hematopoietic and related tissues: Secondary | ICD-10-CM | POA: Diagnosis not present

## 2019-01-04 DIAGNOSIS — Z1159 Encounter for screening for other viral diseases: Secondary | ICD-10-CM

## 2019-01-04 DIAGNOSIS — I110 Hypertensive heart disease with heart failure: Secondary | ICD-10-CM | POA: Diagnosis present

## 2019-01-04 DIAGNOSIS — R0609 Other forms of dyspnea: Secondary | ICD-10-CM | POA: Diagnosis present

## 2019-01-04 DIAGNOSIS — I493 Ventricular premature depolarization: Principal | ICD-10-CM | POA: Diagnosis present

## 2019-01-04 DIAGNOSIS — Z79899 Other long term (current) drug therapy: Secondary | ICD-10-CM

## 2019-01-04 DIAGNOSIS — I255 Ischemic cardiomyopathy: Secondary | ICD-10-CM | POA: Diagnosis present

## 2019-01-04 DIAGNOSIS — Z885 Allergy status to narcotic agent status: Secondary | ICD-10-CM | POA: Diagnosis not present

## 2019-01-04 DIAGNOSIS — Z7984 Long term (current) use of oral hypoglycemic drugs: Secondary | ICD-10-CM

## 2019-01-04 DIAGNOSIS — I2510 Atherosclerotic heart disease of native coronary artery without angina pectoris: Secondary | ICD-10-CM | POA: Diagnosis not present

## 2019-01-04 DIAGNOSIS — Z7902 Long term (current) use of antithrombotics/antiplatelets: Secondary | ICD-10-CM

## 2019-01-04 DIAGNOSIS — R001 Bradycardia, unspecified: Secondary | ICD-10-CM | POA: Diagnosis present

## 2019-01-04 DIAGNOSIS — Z888 Allergy status to other drugs, medicaments and biological substances status: Secondary | ICD-10-CM

## 2019-01-04 DIAGNOSIS — R0602 Shortness of breath: Secondary | ICD-10-CM

## 2019-01-04 DIAGNOSIS — Z95810 Presence of automatic (implantable) cardiac defibrillator: Secondary | ICD-10-CM

## 2019-01-04 DIAGNOSIS — R0600 Dyspnea, unspecified: Secondary | ICD-10-CM | POA: Diagnosis present

## 2019-01-04 LAB — HEPATIC FUNCTION PANEL
ALT: 46 U/L — ABNORMAL HIGH (ref 0–44)
AST: 29 U/L (ref 15–41)
Albumin: 3.8 g/dL (ref 3.5–5.0)
Alkaline Phosphatase: 44 U/L (ref 38–126)
Bilirubin, Direct: 0.2 mg/dL (ref 0.0–0.2)
Indirect Bilirubin: 0.5 mg/dL (ref 0.3–0.9)
Total Bilirubin: 0.7 mg/dL (ref 0.3–1.2)
Total Protein: 7 g/dL (ref 6.5–8.1)

## 2019-01-04 LAB — BASIC METABOLIC PANEL
Anion gap: 13 (ref 5–15)
BUN: 16 mg/dL (ref 8–23)
CO2: 24 mmol/L (ref 22–32)
Calcium: 9.6 mg/dL (ref 8.9–10.3)
Chloride: 103 mmol/L (ref 98–111)
Creatinine, Ser: 0.99 mg/dL (ref 0.61–1.24)
GFR calc Af Amer: >60 mL/min (ref >60)
GFR calc non Af Amer: >60 mL/min (ref >60)
Glucose, Bld: 121 mg/dL — ABNORMAL HIGH (ref 70–99)
Potassium: 4.4 mmol/L (ref 3.5–5.1)
Sodium: 140 mmol/L (ref 135–145)

## 2019-01-04 LAB — CBC
HCT: 49.6 % (ref 39.0–52.0)
Hemoglobin: 16.8 g/dL (ref 13.0–17.0)
MCH: 32 pg (ref 26.0–34.0)
MCHC: 33.9 g/dL (ref 30.0–36.0)
MCV: 94.5 fL (ref 80.0–100.0)
Platelets: 122 10*3/uL — ABNORMAL LOW (ref 150–400)
RBC: 5.25 MIL/uL (ref 4.22–5.81)
RDW: 13.5 % (ref 11.5–15.5)
WBC: 8.8 10*3/uL (ref 4.0–10.5)
nRBC: 0 % (ref 0.0–0.2)

## 2019-01-04 LAB — TROPONIN I
Troponin I: <0.03 ng/mL (ref <0.03)
Troponin I: <0.03 ng/mL (ref <0.03)

## 2019-01-04 LAB — MAGNESIUM: Magnesium: 2.2 mg/dL (ref 1.7–2.4)

## 2019-01-04 LAB — TSH: TSH: 2.742 u[IU]/mL (ref 0.350–4.500)

## 2019-01-04 LAB — D-DIMER, QUANTITATIVE: D-Dimer, Quant: 0.33 ug/mL-FEU (ref 0.00–0.50)

## 2019-01-04 LAB — GLUCOSE, CAPILLARY
Glucose-Capillary: 90 mg/dL (ref 70–99)
Glucose-Capillary: 178 mg/dL — ABNORMAL HIGH (ref 70–99)

## 2019-01-04 LAB — SARS CORONAVIRUS 2 BY RT PCR (HOSPITAL ORDER, PERFORMED IN ~~LOC~~ HOSPITAL LAB): SARS Coronavirus 2: NEGATIVE

## 2019-01-04 LAB — BRAIN NATRIURETIC PEPTIDE: B Natriuretic Peptide: 187.1 pg/mL — ABNORMAL HIGH (ref 0.0–100.0)

## 2019-01-04 MED ORDER — PRAMIPEXOLE DIHYDROCHLORIDE 1 MG PO TABS
0.5000 mg | ORAL_TABLET | Freq: Four times a day (QID) | ORAL | Status: DC
  Administered 2019-01-05 – 2019-01-06 (×6): 0.5 mg via ORAL
  Filled 2019-01-04 (×6): qty 1

## 2019-01-04 MED ORDER — ALBUTEROL SULFATE (2.5 MG/3ML) 0.083% IN NEBU: 3.0000 mL | INHALATION_SOLUTION | Freq: Four times a day (QID) | RESPIRATORY_TRACT | Status: DC | PRN

## 2019-01-04 MED ORDER — ASPIRIN 81 MG PO CHEW
81.0000 mg | CHEWABLE_TABLET | Freq: Every evening | ORAL | Status: DC
  Administered 2019-01-05: 81 mg via ORAL
  Filled 2019-01-04 (×2): qty 1

## 2019-01-04 MED ORDER — NITROGLYCERIN 0.4 MG SL SUBL: 0.4000 mg | SUBLINGUAL_TABLET | SUBLINGUAL | Status: DC | PRN

## 2019-01-04 MED ORDER — ASPIRIN 81 MG PO CHEW
324.0000 mg | CHEWABLE_TABLET | ORAL | Status: AC
  Administered 2019-01-04: 324 mg via ORAL
  Filled 2019-01-04: qty 4

## 2019-01-04 MED ORDER — CANAGLIFLOZIN 100 MG PO TABS
100.0000 mg | ORAL_TABLET | Freq: Every day | ORAL | Status: DC
  Administered 2019-01-05 – 2019-01-06 (×2): 100 mg via ORAL
  Filled 2019-01-04 (×2): qty 1

## 2019-01-04 MED ORDER — INSULIN ASPART 100 UNIT/ML ~~LOC~~ SOLN
0.0000 [IU] | Freq: Three times a day (TID) | SUBCUTANEOUS | Status: DC
  Administered 2019-01-06: 2 [IU] via SUBCUTANEOUS

## 2019-01-04 MED ORDER — SPIRONOLACTONE 12.5 MG HALF TABLET
12.5000 mg | ORAL_TABLET | Freq: Every day | ORAL | Status: DC
  Administered 2019-01-05 – 2019-01-06 (×2): 12.5 mg via ORAL
  Filled 2019-01-04 (×2): qty 1

## 2019-01-04 MED ORDER — CLOPIDOGREL BISULFATE 75 MG PO TABS
75.0000 mg | ORAL_TABLET | Freq: Every day | ORAL | Status: DC
  Administered 2019-01-05 – 2019-01-06 (×2): 75 mg via ORAL
  Filled 2019-01-04 (×2): qty 1

## 2019-01-04 MED ORDER — HEPARIN BOLUS VIA INFUSION
4000.0000 [IU] | Freq: Once | INTRAVENOUS | Status: AC
  Administered 2019-01-04: 4000 [IU] via INTRAVENOUS
  Filled 2019-01-04: qty 4000

## 2019-01-04 MED ORDER — INSULIN ASPART 100 UNIT/ML ~~LOC~~ SOLN: 0.0000 [IU] | Freq: Every day | SUBCUTANEOUS | Status: DC

## 2019-01-04 MED ORDER — MAGNESIUM GLUCONATE 500 MG PO TABS
500.0000 mg | ORAL_TABLET | Freq: Two times a day (BID) | ORAL | Status: DC
  Administered 2019-01-04 – 2019-01-06 (×4): 500 mg via ORAL
  Filled 2019-01-04 (×5): qty 1

## 2019-01-04 MED ORDER — ACETAMINOPHEN 500 MG PO TABS: 500.0000 mg | ORAL_TABLET | Freq: Four times a day (QID) | ORAL | Status: DC | PRN

## 2019-01-04 MED ORDER — ACETAMINOPHEN 325 MG PO TABS: 650.0000 mg | ORAL_TABLET | ORAL | Status: DC | PRN

## 2019-01-04 MED ORDER — SACUBITRIL-VALSARTAN 24-26 MG PO TABS
1.0000 | ORAL_TABLET | Freq: Two times a day (BID) | ORAL | Status: DC
  Administered 2019-01-04 – 2019-01-06 (×4): 1 via ORAL
  Filled 2019-01-04 (×4): qty 1

## 2019-01-04 MED ORDER — SODIUM CHLORIDE 0.9 % IV SOLN
INTRAVENOUS | Status: DC
  Administered 2019-01-04: 19:00:00 via INTRAVENOUS

## 2019-01-04 MED ORDER — INSULIN GLARGINE 100 UNIT/ML ~~LOC~~ SOLN
10.0000 [IU] | Freq: Every day | SUBCUTANEOUS | Status: DC
  Administered 2019-01-04 – 2019-01-06 (×3): 10 [IU] via SUBCUTANEOUS
  Filled 2019-01-04 (×3): qty 0.1

## 2019-01-04 MED ORDER — PRAMIPEXOLE DIHYDROCHLORIDE 1 MG PO TABS
0.5000 mg | ORAL_TABLET | Freq: Four times a day (QID) | ORAL | Status: DC
  Administered 2019-01-04: 19:00:00 0.5 mg via ORAL
  Filled 2019-01-04: qty 1

## 2019-01-04 MED ORDER — HEPARIN (PORCINE) 25000 UT/250ML-% IV SOLN
1250.0000 [IU]/h | INTRAVENOUS | Status: DC
  Administered 2019-01-04: 1100 [IU]/h via INTRAVENOUS
  Administered 2019-01-05: 1250 [IU]/h via INTRAVENOUS
  Filled 2019-01-04 (×2): qty 250

## 2019-01-04 MED ORDER — ASPIRIN 300 MG RE SUPP: 300.0000 mg | RECTAL | Status: AC

## 2019-01-04 MED ORDER — IOHEXOL 350 MG/ML SOLN
100.0000 mL | Freq: Once | INTRAVENOUS | Status: AC | PRN
  Administered 2019-01-04: 100 mL via INTRAVENOUS

## 2019-01-04 MED ORDER — ONDANSETRON HCL 4 MG/2ML IJ SOLN: 4.0000 mg | Freq: Four times a day (QID) | INTRAMUSCULAR | Status: DC | PRN

## 2019-01-04 MED ORDER — ALPRAZOLAM 0.25 MG PO TABS: 0.2500 mg | ORAL_TABLET | Freq: Two times a day (BID) | ORAL | Status: DC | PRN

## 2019-01-04 MED ORDER — CARVEDILOL 3.125 MG PO TABS
3.1250 mg | ORAL_TABLET | Freq: Two times a day (BID) | ORAL | Status: DC
  Administered 2019-01-05 – 2019-01-06 (×3): 3.125 mg via ORAL
  Filled 2019-01-04 (×3): qty 1

## 2019-01-04 MED ORDER — PANTOPRAZOLE SODIUM 40 MG PO TBEC
40.0000 mg | DELAYED_RELEASE_TABLET | Freq: Every evening | ORAL | Status: DC
  Administered 2019-01-05: 40 mg via ORAL
  Filled 2019-01-04 (×2): qty 1

## 2019-01-04 NOTE — ED Notes (Signed)
Pt took home medication of 0.5 of pramipexole (for restless leg syndrome) w/ permission of Dr. Donnald Garre

## 2019-01-04 NOTE — H&P (Addendum)
The patient has been seen in conjunction with Nada Boozer, NP. All aspects of care have been considered and discussed. The patient has been personally interviewed, examined, and all clinical data has been reviewed.   The patient has notable DOE and ? Orthopnea with out cough, fever chills, edema, or other cardiac complaints. No chest pain/angina.  Exam does not suggest volume overload.   D-dimer, Chest CTA to r/o PE  Echocardiogram to assess LV and RV.  May need coronary angio if w/u negative.  Plan to admit to cardiology service.  DDx: PE, Severe low output LV dysfunction(doubt), and Neuromuscular disorder.  Cardiology Admission History and Physical:   Due to the COVID-19 pandemic, this visit was completed with telemedicine (audio/video) technology to reduce patient and provider exposure as well as to preserve personal protective equipment.   Patient ID: [name] MRN: [id number]; DOB: 1956-06-23   Admission date: 01/04/2019  Primary Care Provider: Hal Morales, NP Primary Cardiologist: Rollene Rotunda, MD  Primary Electrophysiologist:  Will [name], MD   Chief Complaint:  Chest pain and SOB  Patient Profile:   [name] is a 63 y.o. male with hx of CAD with multiple stents, HTN, HLD, OSA and DM-2 and ICD placed 07/19/18 with MDT ICD now admitted for DOE, and multiple PVCs.    History of Present Illness:   Mr. Luba with above hx and s/p prior OM1 PCI with DES in 2012. He was cathed in 2014 and 2016 and was treated with medical Rx. He presented to [facility]with [place] and was restudied his last cath this revealed a patent OM1 stent, CTO of the RCA with L-R collaterals from the CFX, 90% CFX (treated with PCI-DES) see below for picture, 40% pLAD and 90% small Dx1. His EF was 20-25% by echo.  meds were titrated and repeat Echo with EF did improved to 30%.  He was sent to EP Dr. Elberta Fortis and had ICD placed in Dec.    He has been seeing his PCP for DOE with  minimal exertion and episodic chest pain.  For about 2 weeks.  Today he did not look good and had freq PVCs on EKG,  Our office instructed him to go to ER.  Over last month the dyspnea has increased, he is having to prop up at night to sleep.  Not much swelling per pt, some in abd.  He has had lower BP at times, and once he stood this week and thought he would pass out.  No real chest pain.  No pain in his jaw.  The SOB yesterday was really bad but today not so much.    No fevers or colds, has self isolated due to health history for weeks.    EKG:  The ECG that was done 01/04/19 at PCP SR with bigeminy PVCs  And no other acute changes was personally reviewed.  EKGs at [facility] are the same with only occ PVCs.    Troponin <0.03, BNP 187 Na 140, K+ 4.4, BUN 16, Cr 0.99, Hgb 16.8 plts 122  COVID negative   2 V CXR IMPRESSION: 1. Cardiomegaly. No active disease. 2. Chronic interstitial prominence, without superimposed consolidation or edema. Stable appearance from priors.   Past Medical History:  Diagnosis Date  . ASCVD (arteriosclerotic cardiovascular disease)    a. MI 2004, did not seek care at that time. b. s/p PTCA/BMS to OM1 04/2011 (known totally occluded RCA with L-R collaterals and failed angioplasty on that vessel). c.)  Cath 2019 chronically occluded right coronary artery with collateralization.  90% circumflex stenosis treated with a DES.  Marland Kitchen. Chronic bronchitis (HCC)   . HTN (hypertension)   . Hyperlipidemia   . Ischemic cardiomyopathy    EF 30%.    . OSA (obstructive sleep apnea) 05/24/11  . Restless leg syndrome 06/25/2011  . Type 2 diabetes mellitus (HCC)     Past Surgical History:  Procedure Laterality Date  . CARDIAC CATHETERIZATION  11/24/2012   40-50% ostial LAD, 40% mid LAD, 60-70% ostial D1, 40% proximal LCx, patent OM branch stent, 99% serial proximal-mid RCA lesions, 100% mid RCA occlusion (chronic with left to right collateralization); EF 40%, inferior wall hypokinesis.   Marland Kitchen. CARDIAC CATHETERIZATION N/A 05/31/2015   Procedure: Left Heart Cath and Coronary Angiography;  Surgeon: Kathleene Hazelhristopher D McAlhany, MD;  [facility];  Service: Cardiovascular;  Laterality: N/A;  . CORONARY STENT INTERVENTION N/A 12/25/2017   Procedure: CORONARY STENT INTERVENTION;  Surgeon: SwazilandJordan, Peter M, MD;  [facility];  Service: Cardiovascular;  Laterality: N/A;  . CORONARY STENT PLACEMENT  04/2011  . CYSTECTOMY     Spine and jaw  . ICD IMPLANT N/A 07/19/2018   Procedure: ICD IMPLANT;  Surgeon: Regan Lemmingamnitz, Will Martin, MD;  [facility];  Service: Cardiovascular;  Laterality: N/A;  . LEFT HEART CATH AND CORONARY ANGIOGRAPHY N/A 12/25/2017   Procedure: LEFT HEART CATH AND CORONARY ANGIOGRAPHY;  Surgeon: SwazilandJordan, Peter M, MD;  [facility];  Service: Cardiovascular;  Laterality: N/A;  . LEFT HEART CATHETERIZATION WITH CORONARY ANGIOGRAM N/A 11/24/2012   Procedure: LEFT HEART CATHETERIZATION WITH CORONARY ANGIOGRAM;  Surgeon: Kathleene Hazelhristopher D McAlhany, MD;  [facility];  Service: Cardiovascular;  Laterality: N/A;  . lesion removed from foot     right foot  . TONSILLECTOMY AND ADENOIDECTOMY       Medications Prior to Admission: Prior to Admission medications   Medication Sig Start Date End Date Taking? Authorizing Provider  acetaminophen (TYLENOL) 500 MG tablet Take 500 mg by mouth every 6 (six) hours as needed (for headaches.).    [provider]  albuterol (PROVENTIL HFA;VENTOLIN HFA) 108 (90 Base) MCG/ACT inhaler Inhale 1-2 puffs into the lungs every 6 (six) hours as needed for wheezing or shortness of breath.    [provider]  aspirin 81 MG tablet Take 81 mg by mouth every evening.     [provider]  carvedilol (COREG) 3.125 MG tablet Take 1 tablet (3.125 mg total) by mouth 2 (two) times daily with a meal. 09/14/18   Camnitz, Will Daphine DeutscherMartin, MD  CINNAMON PO Take 1,000 mg by mouth daily.     [provider]  clopidogrel (PLAVIX) 75 MG tablet Take 1 tablet (75 mg total) by mouth daily. 09/14/18   Camnitz, Will Daphine DeutscherMartin, MD  Coenzyme Q10 (CO Q 10) 100 MG CAPS Take 100 mg by mouth daily.    [provider]  JARDIANCE 10 MG TABS tablet Take 10 mg by mouth daily. 10/05/17   [provider]  magnesium gluconate (MAGONATE) 500 MG tablet Take 500 mg by mouth 2 (two) times daily.     [provider]  metFORMIN (GLUCOPHAGE) 1000 MG tablet Take 1,000 mg by mouth 2 (two) times daily. 10/05/17   [provider]  nitroGLYCERIN (NITROSTAT) 0.4 MG SL tablet Place 1 tablet (0.4 mg total) under the tongue every 5 (five) minutes as needed for chest pain. 04/20/15   Hochrein,  Fayrene Fearing, MD  pantoprazole (PROTONIX) 40 MG tablet Take 1 tablet (40 mg total) by mouth every evening. 03/29/13   Rollene Rotunda, MD  pramipexole (MIRAPEX) 1 MG tablet Take 0.5 mg by mouth every 6 (six) hours.     [provider]  sacubitril-valsartan (ENTRESTO) 24-26 MG Take 1 tablet by mouth 2 (two) times daily. 09/14/18   Camnitz, Andree Coss, MD  spironolactone (ALDACTONE) 25 MG tablet Take 0.5 tablets (12.5 mg total) by mouth daily. 09/14/18   Camnitz, Will Daphine Deutscher, MD  TOUJEO SOLOSTAR 300 UNIT/ML SOPN Inject 20 Units into the skin daily. 10/14/18   [provider]  VOLTAREN 1 % GEL Apply 2 g topically daily. Applied to heel for pain. 03/16/18   [provider]     Allergies:    Allergies  Allergen Reactions  . Brilinta [Ticagrelor] Other (See Comments)    Intolerable dyspnea-changed to Plavix  . Codeine Other (See Comments)    Lightheaded, vision issues  . Lipitor [Atorvastatin Calcium]   . Niaspan Odette Fraction Er]     Social History:   Social History   Socioeconomic History  . Marital status: Married    Spouse name: Not on file  . Number of children: 1  . Years of education: Not on file  . Highest education level: Not on file  Occupational History  .  Occupation: Wellsite geologist   Social Needs  . Financial resource strain: Not on file  . Food insecurity:    Worry: Not on file    Inability: Not on file  . Transportation needs:    Medical: Not on file    Non-medical: Not on file  Tobacco Use  . Smoking status: Former Smoker    Packs/day: 1.50    Years: 35.00    Pack years: 52.50    Types: Cigarettes    Last attempt to quit: 08/18/2005    Years since quitting: 13.3  . Smokeless tobacco: Never Used  Substance and Sexual Activity  . Alcohol use: No  . Drug use: No  . Sexual activity: Not Currently  Lifestyle  . Physical activity:    Days per week: Not on file    Minutes per session: Not on file  . Stress: Not on file  Relationships  . Social connections:    Talks on phone: Not on file    Gets together: Not on file    Attends religious service: Not on file    Active member of club or organization: Not on file    Attends meetings of clubs or organizations: Not on file    Relationship status: Not on file  . Intimate partner violence:    Fear of current or ex partner: Not on file    Emotionally abused: Not on file    Physically abused: Not on file    Forced sexual activity: Not on file  Other Topics Concern  . Not on file  Social History Narrative   Married with 1 grown daughter who is a Diplomatic Services operational officer for a school          Family History:   The patient's family history includes Heart attack (age of onset: 76) in his brother; Heart attack (age of onset: 11) in his father; Lymphoma in his mother.    ROS:  Please see the history of present illness.  General:no colds or fevers, no weight changes Skin:no rashes or ulcers HEENT:no blurred vision, no congestion CV:see HPI PUL:see HPI GI:no diarrhea constipation or melena, no indigestion GU:no  hematuria, no dysuria MS:no joint pain, no claudication Neuro:no syncope, no lightheadedness Endo:no diabetes, no thyroid disease All other ROS reviewed and negative.     Physical  Exam/Data:   Vitals:   01/04/19 1419 01/04/19 1430 01/04/19 1500 01/04/19 1515  BP: 136/61 (!) 135/92 (!) 132/92 132/86  Pulse: 98 65 (!) 58 60  Resp: Temp:      TempSrc:      SpO2: 95% 97% 93% 92%  Weight:      Height:       No intake or output data in the 24 hours ending 01/04/19 1547 Last 3 Weights 01/04/2019 10/22/2018 07/20/2018  Weight (lbs) 214 lb 221 lb 207 lb 9.6 oz  Weight (kg) 97.07 kg 100.245 kg 94.167 kg     Body mass index is 30.71 kg/m.   VITAL SIGNS:  reviewed GEN:  no acute distress EYES:  sclerae anicteric, EOMI - Extraocular Movements Intact RESPIRATORY:  normal respiratory effort, symmetric expansion CARDIOVASCULAR:  No edema. No significant JVD sitting.No gallop but irregular heart beat due to PVC's. SKIN:  no rash, lesions or ulcers. MUSCULOSKELETAL:  no obvious deformities. NEURO:  alert and oriented x 3, no obvious focal deficit PSYCH:  normal affect  Relevant CV Studies: Last Cardiac Cath 12/25/17   Prox RCA lesion is 99% stenosed.  Prox RCA to Mid RCA lesion is 90% stenosed.  Mid RCA lesion is 100% stenosed.  Prox LAD lesion is 40% stenosed.  Ost 1st Diag lesion is 90% stenosed.  Prox LAD to Mid LAD lesion is 10% stenosed.  Previously placed Ost 1st Mrg to 1st Mrg stent (unknown type) is widely patent.  Ost Cx to Prox Cx lesion is 90% stenosed.  Post intervention, there is a 0% residual stenosis.  A drug-eluting stent was successfully placed using a STENT SYNERGY DES 3.5X12.  Non-stenotic Prox Cx to Mid Cx lesion.  LV end diastolic pressure is normal.   1. 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 Normal LVEDP 3. Successful stenting of the proximal LCx with DES  Plan: DAPT for one year. Anticipate DC in am. Optimize CHF therapy. Diagnostic  Dominance: Right     Intervention      Echo 03/15/18  Study Conclusions  - Left ventricle: The cavity size was mildly dilated. Wall   thickness was normal. Systolic function was severely reduced. The   estimated ejection fraction was in the range of 25% to 30%.   Diffuse hypokinesis. There is akinesis of the inferolateral   myocardium. Doppler parameters are consistent with abnormal left   ventricular relaxation (grade 1 diastolic dysfunction). - Right ventricle: The cavity size was mildly dilated. - Right atrium: The atrium was mildly dilated.  Impressions:  - Global hypokinesis with akinesis of the inferolateral wall;   overall severely reduced LV systolic function; mild LVE; mild   diastolic dysfunction; mild RAE and RVE.   Laboratory Data:  Chemistry Recent Labs  Lab 01/04/19 1353  NA 140  K 4.4  CL 103  CO2 24  GLUCOSE 121*  BUN 16  CREATININE 0.99  CALCIUM 9.6  GFRNONAA >60  GFRAA >60  ANIONGAP 13    No results for input(s): PROT, ALBUMIN, AST, ALT, ALKPHOS, BILITOT in the  last 168 hours. Hematology Recent Labs  Lab 01/04/19 1353  WBC 8.8  RBC 5.25  HGB 16.8  HCT 49.6  MCV 94.5  MCH 32.0  MCHC 33.9  RDW 13.5  PLT 122*   Cardiac Enzymes Recent Labs  Lab 01/04/19 1353  TROPONINI <0.03   No results for input(s): TROPIPOC in the last 168 hours.  BNP Recent Labs  Lab 01/04/19 1433  BNP 187.1*    DDimer No results for input(s): DDIMER in the last 168 hours.  Radiology/Studies:  Dg Chest 2 View  Result Date: 01/04/2019 CLINICAL DATA:  Chest pain and shortness of breath with fatigue. EXAM: CHEST - 2 VIEW COMPARISON:  07/20/2018. FINDINGS: Cardiomegaly. Single lead pacer good position. Chronic interstitial prominence, without superimposed consolidation or edema. No effusion or pneumothorax. IMPRESSION: 1. Cardiomegaly. No active disease. 2. Chronic interstitial prominence, without superimposed consolidation or edema. Stable appearance from priors.  Electronically Signed   By: Elsie Stain M.D.   On: 01/04/2019 13:28     Assessment and Plan:   1. DOE, not everyday but freq,  And comes on suddenly.  No wheezing. May be angina, vs CHF or both.  Dr. Katrinka Blazing to see.  May need to admit and diuresis, not on lasix only aldactone. Initial troponin neg.   2. CAD with multiple DES stents most recent 1 yr ago with stent to prox LCX.  On ASA, BB, plavix   3. ICM with EF 25-30 % on entresto, coreg, aldactone 4. DM-2 on jardiance, metformin and insulin 5. ICD- MDT placed 07/2018\ 6. PVCs more freq on earlier EKG from PCP office 7. HLD statin intolerant no interested in alternative Rx on last visit with Dr. Antoine Poche.  8. Chronic bronchitis and inhaler, which helps at times.  Severity of Illness: The appropriate patient status for this patient is INPATIENT. Inpatient status is judged to be reasonable and necessary in order to provide the required intensity of service to ensure the patient's safety. The patient's presenting symptoms, physical exam findings, and initial radiographic and laboratory data in the context of their chronic comorbidities is felt to place them at high risk for further clinical deterioration. Furthermore, it is not anticipated that the patient will be medically stable for discharge from the hospital within 2 midnights of admission. The following factors support the patient status of inpatient.   " The patient's presenting symptoms include continued DOE. " The worrisome physical exam findings include SOB. " The initial radiographic and laboratory data are worrisome because of no acute changes " The chronic co-morbidities include CAD, HTN DM.   * I certify that at the point of admission it is my clinical judgment that the patient will require inpatient hospital care spanning beyond 2 midnights from the point of admission due to high intensity of service, high risk for further deterioration and high frequency of surveillance  required.*    For questions or updates, please contact [facility] Please consult [email or web address] for contact info under       Signed, Nada Boozer, NP  01/04/2019 3:47 PM

## 2019-01-04 NOTE — ED Provider Notes (Signed)
DOE. Referred to ED by PCP and Dr. Antoine Poche. Cardiology consult pending, anticipated likely admit. Patient clinically stable at this time. Physical Exam  BP (!) 135/92   Pulse 65   Temp 98.9 F (37.2 C) (Oral)   Resp 15   Ht 5\' 10"  (1.778 m)   Wt 97.1 kg   SpO2 97%   BMI 30.71 kg/m   Physical Exam  ED Course/Procedures     Procedures  MDM         Arby Barrette, MD 01/12/19 7116

## 2019-01-04 NOTE — ED Notes (Signed)
ED TO INPATIENT HANDOFF REPORT  ED Nurse Name and Phone #: Henderson [place] [phone]  [name] 63 y.o. male Room/Bed: 026C/026C  Code Status   Code Status: Prior  Home/SNF/Other Home Patient oriented to: self, place, time and situation Is this baseline? Yes   Triage Complete: Triage complete  Chief Complaint SOB  Triage Note Pt arrives POV as referral from PCP. Pt endorses episodic chest pain w/ associated SOB x 2 weeks. Pt reports he was seen at PCP this AM, EKG was done and when his PCP consulted cards, they advised he present here for further eval. PT denies CP on arrival, endorses weakness and states he has been having some pre-syncopal episodes.    Allergies Allergies  Allergen Reactions  . Brilinta [Ticagrelor] Other (See Comments)    Intolerable dyspnea-changed to Plavix  . Codeine Other (See Comments)    Lightheaded, vision issues  . Lipitor [Atorvastatin Calcium] Other (See Comments)    Muscle pain  . Niaspan [Niacin Er] Other (See Comments)    Head hurts    Level of Care/Admitting Diagnosis ED Disposition    ED Disposition Condition Comment   Admit  Hospital Area: [facility] [[id number]]  Level of Care: Telemetry Cardiac [103]  Covid Evaluation: N/A  Diagnosis: Dyspnea on effort [960454]  Admitting Physician: Lyn Records [[id number]  Attending Physician: Lyn Records [[id number]]  Estimated length of stay: past midnight tomorrow  Certification:: I certify this patient will need inpatient services for at least 2 midnights  PT Class (Do Not Modify): Inpatient [101]  PT Acc Code (Do Not Modify): Private [1]       B Medical/Surgery History Past Medical History:  Diagnosis Date  . ASCVD (arteriosclerotic cardiovascular disease)    a. MI 2004, did not seek care at that time. b. s/p PTCA/BMS to OM1 04/2011 (known totally occluded RCA with L-R collaterals and failed angioplasty on that vessel). c.)  Cath 2019 chronically occluded right  coronary artery with collateralization.  90% circumflex stenosis treated with a DES.  Marland Kitchen Chronic bronchitis (HCC)   . HTN (hypertension)   . Hyperlipidemia   . Ischemic cardiomyopathy    EF 30%.    . OSA (obstructive sleep apnea) 05/24/11  . Restless leg syndrome 06/25/2011  . Type 2 diabetes mellitus (HCC)    Past Surgical History:  Procedure Laterality Date  . CARDIAC CATHETERIZATION  11/24/2012   40-50% ostial LAD, 40% mid LAD, 60-70% ostial D1, 40% proximal LCx, patent OM branch stent, 99% serial proximal-mid RCA lesions, 100% mid RCA occlusion (chronic with left to right collateralization); EF 40%, inferior wall hypokinesis.  Marland Kitchen CARDIAC CATHETERIZATION N/A 05/31/2015   Procedure: Left Heart Cath and Coronary Angiography;  Surgeon: Kathleene Hazel, MD;  Location: [facility] INVASIVE CV LAB;  Service: Cardiovascular;  Laterality: N/A;  . CORONARY STENT INTERVENTION N/A 12/25/2017   Procedure: CORONARY STENT INTERVENTION;  Surgeon: Swaziland, Peter M, MD;  Location: [facility] INVASIVE CV LAB;  Service: Cardiovascular;  Laterality: N/A;  . CORONARY STENT PLACEMENT  04/2011  . CYSTECTOMY     Spine and jaw  . ICD IMPLANT N/A 07/19/2018   Procedure: ICD IMPLANT;  Surgeon: Regan Lemming, MD;  Location: [facility] INVASIVE CV LAB;  Service: Cardiovascular;  Laterality: N/A;  . LEFT HEART CATH AND CORONARY ANGIOGRAPHY N/A 12/25/2017   Procedure: LEFT HEART CATH AND CORONARY ANGIOGRAPHY;  Surgeon: Swaziland, Peter M, MD;  Location: [facility] INVASIVE CV LAB;  Service: Cardiovascular;  Laterality: N/A;  .  LEFT HEART CATHETERIZATION WITH CORONARY ANGIOGRAM N/A 11/24/2012   Procedure: LEFT HEART CATHETERIZATION WITH CORONARY ANGIOGRAM;  Surgeon: Kathleene Hazel, MD;  Location: [facility] CATH LAB;  Service: Cardiovascular;  Laterality: N/A;  . lesion removed from foot     right foot  . TONSILLECTOMY AND ADENOIDECTOMY       A IV Location/Drains/Wounds Patient Lines/Drains/Airways Status   Active Line/Drains/Airways     Name:   Placement date:   Placement time:   Site:   Days:   Peripheral IV 01/04/19 Right Antecubital   01/04/19    1458    Antecubital   less than 1   Incision (Closed) 07/19/18 Chest Left;Upper   07/19/18    1252     169          Intake/Output Last 24 hours No intake or output data in the 24 hours ending 01/04/19 1739  Labs/Imaging Results for orders placed or performed during the hospital encounter of 01/04/19 (from the past 48 hour(s))  Basic metabolic panel     Status: Abnormal   Collection Time: 01/04/19  1:53 PM  Result Value Ref Range   Sodium 140 135 - 145 mmol/L   Potassium 4.4 3.5 - 5.1 mmol/L   Chloride 103 98 - 111 mmol/L   CO2 24 22 - 32 mmol/L   Glucose, Bld 121 (H) 70 - 99 mg/dL   BUN 16 8 - 23 mg/dL   Creatinine, Ser 7.10 0.61 - 1.24 mg/dL   Calcium 9.6 8.9 - 62.6 mg/dL   GFR calc non Af Amer >60 >60 mL/min   GFR calc Af Amer >60 >60 mL/min   Anion gap 13 5 - 15    Comment: Performed at [facility], [address], [place], [place] [address]  CBC     Status: Abnormal   Collection Time: 01/04/19  1:53 PM  Result Value Ref Range   WBC 8.8 4.0 - 10.5 K/uL   RBC 5.25 4.22 - 5.81 MIL/uL   Hemoglobin 16.8 13.0 - 17.0 g/dL   HCT 62.7 03.5 - 00.9 %   MCV 94.5 80.0 - 100.0 fL   MCH 32.0 26.0 - 34.0 pg   MCHC 33.9 30.0 - 36.0 g/dL   RDW 38.1 82.9 - 93.7 %   Platelets 122 (L) 150 - 400 K/uL   nRBC 0.0 0.0 - 0.2 %    Comment: Performed at [facility], [address], [place], [place] [address]  Troponin I - ONCE - STAT     Status: None   Collection Time: 01/04/19  1:53 PM  Result Value Ref Range   Troponin I <0.03 <0.03 ng/mL    Comment: Performed at [facility], [address], [place], [place] [address]  Brain natriuretic peptide     Status: Abnormal   Collection Time: 01/04/19  2:33 PM  Result Value Ref Range   B Natriuretic Peptide 187.1 (H) 0.0 - 100.0 pg/mL    Comment: Performed at [facility], [address], [place],  [place] [address]  SARS Coronavirus 2 (CEPHEID - Performed in [facility] hospital lab), Hosp Order     Status: None   Collection Time: 01/04/19  2:52 PM  Result Value Ref Range   SARS Coronavirus 2 NEGATIVE NEGATIVE    Comment: (NOTE) If result is NEGATIVE SARS-CoV-2 target nucleic acids are NOT DETECTED. The SARS-CoV-2 RNA is generally detectable in upper and lower  respiratory specimens during the acute phase  of infection. The lowest  concentration of SARS-CoV-2 viral copies this assay can detect is 250  copies / mL. A negative result does not preclude SARS-CoV-2 infection  and should not be used as the sole basis for treatment or other  patient management decisions.  A negative result may occur with  improper specimen collection / handling, submission of specimen other  than nasopharyngeal swab, presence of viral mutation(s) within the  areas targeted by this assay, and inadequate number of viral copies  (<250 copies / mL). A negative result must be combined with clinical  observations, patient history, and epidemiological information. If result is POSITIVE SARS-CoV-2 target nucleic acids are DETECTED. The SARS-CoV-2 RNA is generally detectable in upper and lower  respiratory specimens dur ing the acute phase of infection.  Positive  results are indicative of active infection with SARS-CoV-2.  Clinical  correlation with patient history and other diagnostic information is  necessary to determine patient infection status.  Positive results do  not rule out bacterial infection or co-infection with other viruses. If result is PRESUMPTIVE POSTIVE SARS-CoV-2 nucleic acids MAY BE PRESENT.   A presumptive positive result was obtained on the submitted specimen  and confirmed on repeat testing.  While 2019 novel coronavirus  (SARS-CoV-2) nucleic acids may be present in the submitted sample  additional confirmatory testing may be necessary for epidemiological  and / or clinical management purposes   to differentiate between  SARS-CoV-2 and other Sarbecovirus currently known to infect humans.  If clinically indicated additional testing with an alternate test  methodology [phone]) is advised. The SARS-CoV-2 RNA is generally  detectable in upper and lower respiratory sp ecimens during the acute  phase of infection. The expected result is Negative. Fact Sheet for Patients:  BoilerBrush.com.cy Fact Sheet for Healthcare Providers: https://pope.com/ This test is not yet approved or cleared by the [place] FDA and has been authorized for detection and/or diagnosis of SARS-CoV-2 by FDA under an Emergency Use Authorization (EUA).  This EUA will remain in effect (meaning this test can be used) for the duration of the COVID-19 declaration under Section 564(b)(1) of the Act, 21 U.S.C. section 360bbb-3(b)(1), unless the authorization is terminated or revoked sooner. Performed at [facility], [address] [place], [place] [address]    Dg Chest 2 View  Result Date: 01/04/2019 CLINICAL DATA:  Chest pain and shortness of breath with fatigue. EXAM: CHEST - 2 VIEW COMPARISON:  07/20/2018. FINDINGS: Cardiomegaly. Single lead pacer good position. Chronic interstitial prominence, without superimposed consolidation or edema. No effusion or pneumothorax. IMPRESSION: 1. Cardiomegaly. No active disease. 2. Chronic interstitial prominence, without superimposed consolidation or edema. Stable appearance from priors. Electronically Signed   By: Elsie Stain M.D.   On: 01/04/2019 13:28    Pending Labs Unresulted Labs (From admission, onward)    Start     Ordered   01/04/19 1724  D-dimer, quantitative (not at [facility])  Once,   R     01/04/19 1724   01/04/19 1646  Troponin I - Now Then Q6H  Now then every 6 hours,   R     01/04/19 1645   01/04/19 1645  Magnesium  Once,   R     01/04/19 1645   01/04/19 1645  TSH  Once,   R     01/04/19 1645    01/04/19 1645  Hepatic function panel  Once,   R     01/04/19 1645   Signed and Held  HIV  antibody (Routine Testing)  Once,   R     Signed and Held   Signed and Held  Basic metabolic panel  Tomorrow morning,   R     Signed and Held   Signed and Held  Lipid panel  Tomorrow morning,   R     Signed and Held   Signed and Held  CBC  Tomorrow morning,   R     Signed and Held          Vitals/Pain Today's Vitals   01/04/19 1500 01/04/19 1515 01/04/19 1600 01/04/19 1630  BP:  132/86 (!) 124/96 104/90  Pulse:  60 63 (!) 56  Resp:  20 (!) 22 13  Temp:      TempSrc:      SpO2:  92% 94% 94%  Weight:      Height:      PainSc: 0-No pain       Isolation Precautions No active isolations  Medications Medications  insulin aspart (novoLOG) injection 0-9 Units (has no administration in time range)  insulin aspart (novoLOG) injection 0-5 Units (has no administration in time range)    Mobility walks Low fall risk   Focused Assessments Cardiac Assessment Handoff:  Cardiac Rhythm: Normal sinus rhythm Lab Results  Component Value Date   CKTOTAL 200 05/13/2011   CKMB 5.6 (H) 05/13/2011   TROPONINI <0.03 01/04/2019   No results found for: DDIMER Does the Patient currently have chest pain? No     R Recommendations: See Admitting Provider Note  Report given to:   Additional Notes:

## 2019-01-04 NOTE — Telephone Encounter (Signed)
The nurse practitioner from [facility] [place] called regarding a patient by the name of [name]. She reported that the patient was seeing her for shortness of breath with minimal exertion. They drew a BNP and a BMP for labs and also performed an EKG due to his heart rate being irregular. She reported that the EKG showed multiple PVC's and that the patient has an ICD. His heart rate has been fluctuating between 40 - 60 and he "just doesn't look good".   Based on this report from the nurse practitioner I went to the Dr. Rennis Golden (DOD) and reviewed the patient with him and his recommendation was to send the patient to the ER. Called [facility] and relayed Dr. Blanchie Dessert recommendation to the nurse practitioner. She verbalized understanding and said she would send patient to [facility] ER.  Notified Trish in [place] ER that the patient would be arriving in the ER.

## 2019-01-04 NOTE — Progress Notes (Signed)
ANTICOAGULATION CONSULT NOTE - Initial Consult  Pharmacy Consult for Heparin Indication: chest pain/ACS  Allergies  Allergen Reactions  . Brilinta [Ticagrelor] Other (See Comments)    Intolerable dyspnea-changed to Plavix  . Codeine Other (See Comments)    Lightheaded, vision issues  . Lipitor [Atorvastatin Calcium] Other (See Comments)    Muscle pain  . Niaspan [Niacin Er] Other (See Comments)    Head hurts    Patient Measurements: Height: 5\' 10"  (177.8 cm) Weight: 214 lb (97.1 kg) IBW/kg (Calculated) : 73 HEPARIN DW (KG): 93   Vital Signs: Temp: 98.9 F (37.2 C) (05/19 1254) Temp Source: Oral (05/19 1254) BP: 125/80 (05/19 1645) Pulse Rate: 65 (05/19 1715)  Labs: Recent Labs    01/04/19 1353  HGB 16.8  HCT 49.6  PLT 122*  CREATININE 0.99  TROPONINI <0.03    Estimated Creatinine Clearance: 89.2 mL/min (by C-G formula based on SCr of 0.99 mg/dL).   Medical History: Past Medical History:  Diagnosis Date  . ASCVD (arteriosclerotic cardiovascular disease)    a. MI 2004, did not seek care at that time. b. s/p PTCA/BMS to OM1 04/2011 (known totally occluded RCA with L-R collaterals and failed angioplasty on that vessel). c.)  Cath 2019 chronically occluded right coronary artery with collateralization.  90% circumflex stenosis treated with a DES.  Marland Kitchen Chronic bronchitis (HCC)   . HTN (hypertension)   . Hyperlipidemia   . Ischemic cardiomyopathy    EF 30%.    . OSA (obstructive sleep apnea) 05/24/11  . Restless leg syndrome 06/25/2011  . Type 2 diabetes mellitus (HCC)     Assessment: Pt is a 33 yoM with a history of CAD with multiple stents. He presented with episodic chest pain with shortness of breath.  Initial troponin undetectable. CBC WNL.   Goal of Therapy:  Heparin level 0.3-0.7 units/ml Monitor platelets by anticoagulation protocol: Yes   Plan:  Give heparin 4000 units bolus x 1 Start heparin infusion at 1100 units/hr Check anti-Xa level in 6 hours  and daily while on heparin Continue to monitor H&H and platelets   Marcelino Freestone, PharmD PGY2 Cardiology Pharmacy Resident Please check AMION for all Pharmacist numbers by unit 01/04/2019 5:53 PM

## 2019-01-04 NOTE — ED Notes (Signed)
Attempted report 

## 2019-01-04 NOTE — ED Notes (Signed)
Pt ambulated to room from waiting room, tolerated well. Pt had no complaints of shortness of breath or dizziness.

## 2019-01-04 NOTE — ED Provider Notes (Signed)
[facility] EMERGENCY DEPARTMENT Provider Note   CSN: [id number] Arrival date & time: 01/04/19  1237    History   Chief Complaint Chief Complaint  Patient presents with   Chest Pain   Shortness of Breath    HPI [name] is a 63 y.o. male.  He is a history of coronary disease and cardiomyopathy with an AICD in place.  He is complaining of intermittent shortness of breath that is been going on for over a month.  He says he has good days and bad days.  He gets shortness of breath with exertion and sometimes cannot lie flat.  Not associate with any chest pain or jaw pain which was his anginal equivalent in the past.  He says his weights have been fairly stable.  No fever.  No significant leg swelling.  He went to his primary care doctor today who did some blood work and an EKG and talked with his cardiologist Dr. Antoine Poche who recommended that he come to the ED for evaluation.  Patient states he is also felt lightheaded at times and feels like he might pass out.     The history is provided by the patient.  Shortness of Breath  Severity:  Severe Onset quality:  Gradual Duration:  1 month Timing:  Intermittent Progression:  Unchanged Chronicity:  Recurrent Context: activity   Relieved by:  Nothing Worsened by:  Activity Ineffective treatments:  Sitting up, rest and diuretics Associated symptoms: no abdominal pain, no chest pain, no cough, no diaphoresis, no fever, no headaches, no hemoptysis, no rash, no sore throat, no sputum production, no syncope and no vomiting   Risk factors: no recent surgery     Past Medical History:  Diagnosis Date   ASCVD (arteriosclerotic cardiovascular disease)    a. MI 2004, did not seek care at that time. b. s/p PTCA/BMS to OM1 04/2011 (known totally occluded RCA with L-R collaterals and failed angioplasty on that vessel). c.)  Cath 2019 chronically occluded right coronary artery with collateralization.  90% circumflex stenosis  treated with a DES.   Chronic bronchitis (HCC)    HTN (hypertension)    Hyperlipidemia    Ischemic cardiomyopathy    EF 30%.     OSA (obstructive sleep apnea) 05/24/11   Restless leg syndrome 06/25/2011   Type 2 diabetes mellitus (HCC)     Patient Active Problem List   Diagnosis Date Noted   Ischemic cardiomyopathy 07/19/2018   Thrombocytopenia (HCC) 01/22/2018   Dyspnea 01/21/2018   Acute on chronic systolic CHF (congestive heart failure) (HCC) 12/25/2017   Unstable angina (HCC) 12/24/2017   Cardiomyopathy, ischemic    Essential hypertension 11/25/2012   Chronic bronchitis (HCC) 11/25/2012   Non-insulin dependent type 2 diabetes mellitus (HCC) 11/25/2012   Bradycardia 11/25/2012   Restless leg syndrome 06/25/2011   Dyslipidemia 06/05/2011   OSA (obstructive sleep apnea) 06/05/2011   CAD S/P percutaneous coronary angioplasty 05/09/2011    Past Surgical History:  Procedure Laterality Date   CARDIAC CATHETERIZATION  11/24/2012   40-50% ostial LAD, 40% mid LAD, 60-70% ostial D1, 40% proximal LCx, patent OM branch stent, 99% serial proximal-mid RCA lesions, 100% mid RCA occlusion (chronic with left to right collateralization); EF 40%, inferior wall hypokinesis.   CARDIAC CATHETERIZATION N/A 05/31/2015   Procedure: Left Heart Cath and Coronary Angiography;  Surgeon: Kathleene Hazel, MD;  Location: [facility] INVASIVE CV LAB;  Service: Cardiovascular;  Laterality: N/A;   CORONARY STENT INTERVENTION N/A 12/25/2017  Procedure: CORONARY STENT INTERVENTION;  Surgeon: Swaziland, Peter M, MD;  Location: [facility] INVASIVE CV LAB;  Service: Cardiovascular;  Laterality: N/A;   CORONARY STENT PLACEMENT  04/2011   CYSTECTOMY     Spine and jaw   ICD IMPLANT N/A 07/19/2018   Procedure: ICD IMPLANT;  Surgeon: Regan Lemming, MD;  Location: [facility] INVASIVE CV LAB;  Service: Cardiovascular;  Laterality: N/A;   LEFT HEART CATH AND CORONARY ANGIOGRAPHY N/A 12/25/2017   Procedure:  LEFT HEART CATH AND CORONARY ANGIOGRAPHY;  Surgeon: Swaziland, Peter M, MD;  Location: [facility] INVASIVE CV LAB;  Service: Cardiovascular;  Laterality: N/A;   LEFT HEART CATHETERIZATION WITH CORONARY ANGIOGRAM N/A 11/24/2012   Procedure: LEFT HEART CATHETERIZATION WITH CORONARY ANGIOGRAM;  Surgeon: Kathleene Hazel, MD;  Location: [facility] CATH LAB;  Service: Cardiovascular;  Laterality: N/A;   lesion removed from foot     right foot   TONSILLECTOMY AND ADENOIDECTOMY          Home Medications    Prior to Admission medications   Medication Sig Start Date End Date Taking? Authorizing Provider  acetaminophen (TYLENOL) 500 MG tablet Take 500 mg by mouth every 6 (six) hours as needed (for headaches.).    [provider]  albuterol (PROVENTIL HFA;VENTOLIN HFA) 108 (90 Base) MCG/ACT inhaler Inhale 1-2 puffs into the lungs every 6 (six) hours as needed for wheezing or shortness of breath.    [provider]  aspirin 81 MG tablet Take 81 mg by mouth every evening.     [provider]  carvedilol (COREG) 3.125 MG tablet Take 1 tablet (3.125 mg total) by mouth 2 (two) times daily with a meal. 09/14/18   Camnitz, Will Daphine Deutscher, MD  CINNAMON PO Take 1,000 mg by mouth daily.    [provider]  clopidogrel (PLAVIX) 75 MG tablet Take 1 tablet (75 mg total) by mouth daily. 09/14/18   Camnitz, Will Daphine Deutscher, MD  Coenzyme Q10 (CO Q 10) 100 MG CAPS Take 100 mg by mouth daily.    [provider]  JARDIANCE 10 MG TABS tablet Take 10 mg by mouth daily. 10/05/17   [provider]  magnesium gluconate (MAGONATE) 500 MG tablet Take 500 mg by mouth 2 (two) times daily.     [provider]  metFORMIN (GLUCOPHAGE) 1000 MG tablet Take 1,000 mg by mouth 2 (two) times daily. 10/05/17   [provider]  nitroGLYCERIN (NITROSTAT) 0.4 MG SL tablet Place 1 tablet (0.4 mg total) under the tongue every 5 (five) minutes as needed for chest pain. 04/20/15   Rollene Rotunda, MD   pantoprazole (PROTONIX) 40 MG tablet Take 1 tablet (40 mg total) by mouth every evening. 03/29/13   Rollene Rotunda, MD  pramipexole (MIRAPEX) 1 MG tablet Take 0.5 mg by mouth every 6 (six) hours.     [provider]  sacubitril-valsartan (ENTRESTO) 24-26 MG Take 1 tablet by mouth 2 (two) times daily. 09/14/18   Camnitz, Andree Coss, MD  spironolactone (ALDACTONE) 25 MG tablet Take 0.5 tablets (12.5 mg total) by mouth daily. 09/14/18   Camnitz, Will Daphine Deutscher, MD  TOUJEO SOLOSTAR 300 UNIT/ML SOPN Inject 20 Units into the skin daily. 10/14/18   [provider]  VOLTAREN 1 % GEL Apply 2 g topically daily. Applied to heel for pain. 03/16/18   [provider]    Family History Family History  Problem Relation Age of Onset   Lymphoma Mother    Heart attack Father 25   Heart  attack Brother 2    Social History Social History   Tobacco Use   Smoking status: Former Smoker    Packs/day: 1.50    Years: 35.00    Pack years: 52.50    Types: Cigarettes    Last attempt to quit: 08/18/2005    Years since quitting: 13.3   Smokeless tobacco: Never Used  Substance Use Topics   Alcohol use: No   Drug use: No     Allergies   Brilinta [ticagrelor]; Codeine; Lipitor [atorvastatin calcium]; and Niaspan [niacin er]   Review of Systems Review of Systems  Constitutional: Negative for diaphoresis and fever.  HENT: Negative for sore throat.   Eyes: Negative for visual disturbance.  Respiratory: Positive for shortness of breath. Negative for cough, hemoptysis and sputum production.   Cardiovascular: Negative for chest pain and syncope.  Gastrointestinal: Negative for abdominal pain and vomiting.  Genitourinary: Negative for dysuria.  Musculoskeletal: Negative for joint swelling.  Skin: Negative for rash.  Neurological: Negative for headaches.     Physical Exam Updated Vital Signs BP 136/61 (BP Location: Right Arm)    Pulse 98    Temp 98.9 F (37.2 C) (Oral)     Resp 13    Ht 5\' 10"  (1.778 m)    Wt 97.1 kg    SpO2 95%    BMI 30.71 kg/m   Physical Exam Vitals signs and nursing note reviewed.  Constitutional:      Appearance: He is well-developed.  HENT:     Head: Normocephalic and atraumatic.  Eyes:     Conjunctiva/sclera: Conjunctivae normal.  Neck:     Musculoskeletal: Neck supple.  Cardiovascular:     Rate and Rhythm: Normal rate and regular rhythm.     Heart sounds: Normal heart sounds. No murmur.  Pulmonary:     Effort: Pulmonary effort is normal. No respiratory distress.     Breath sounds: Normal breath sounds.  Abdominal:     Palpations: Abdomen is soft.     Tenderness: There is no abdominal tenderness.  Musculoskeletal: Normal range of motion.     Right lower leg: He exhibits no tenderness. Edema (trace bilateral) present.     Left lower leg: He exhibits no tenderness. Edema present.  Skin:    General: Skin is warm and dry.     Capillary Refill: Capillary refill takes less than 2 seconds.  Neurological:     General: No focal deficit present.     Mental Status: He is alert and oriented to person, place, and time.      ED Treatments / Results  Labs (all labs ordered are listed, but only abnormal results are displayed) Labs Reviewed  BASIC METABOLIC PANEL - Abnormal; Notable for the following components:      Result Value   Glucose, Bld 121 (*)    All other components within normal limits  CBC - Abnormal; Notable for the following components:   Platelets 122 (*)    All other components within normal limits  BRAIN NATRIURETIC PEPTIDE - Abnormal; Notable for the following components:   B Natriuretic Peptide 187.1 (*)    All other components within normal limits  SARS CORONAVIRUS 2 (HOSPITAL ORDER, PERFORMED IN [facility])  TROPONIN I  MAGNESIUM  TSH  HEPATIC FUNCTION PANEL  TROPONIN I  TROPONIN I  TROPONIN I  D-DIMER, QUANTITATIVE (NOT AT [facility])    EKG EKG Interpretation  Date/Time:  Tuesday Jan 04 2019 14:48:14 EDT Ventricular Rate:  60  PR Interval:  200 QRS Duration: 112 QT Interval:  412 QTC Calculation: 412 R Axis:   -14 Text Interpretation:  Sinus rhythm Ventricular premature complex Borderline prolonged PR interval Probable left atrial enlargement Borderline intraventricular conduction delay Nonspecific T abnormalities, inferior leads similar ro prior today Confirmed by Meridee Score [phone]) on 01/04/2019 2:55:47 PM   Radiology Dg Chest 2 View  Result Date: 01/04/2019 CLINICAL DATA:  Chest pain and shortness of breath with fatigue. EXAM: CHEST - 2 VIEW COMPARISON:  07/20/2018. FINDINGS: Cardiomegaly. Single lead pacer good position. Chronic interstitial prominence, without superimposed consolidation or edema. No effusion or pneumothorax. IMPRESSION: 1. Cardiomegaly. No active disease. 2. Chronic interstitial prominence, without superimposed consolidation or edema. Stable appearance from priors. Electronically Signed   By: Elsie Stain M.D.   On: 01/04/2019 13:28    Procedures Procedures (including critical care time)  Medications Ordered in ED Medications - No data to display   Initial Impression / Assessment and Plan / ED Course  I have reviewed the triage vital signs and the nursing notes.  Pertinent labs & imaging results that were available during my care of the patient were reviewed by me and considered in my medical decision making (see chart for details).       Known CAD and CM with DOE and PND for 1 month. Cardiology consult pending. [facility] admission for diuresis and further workup. Signed out to Dr Donnald Garre with cards consult pending.   Final Clinical Impressions(s) / ED Diagnoses   Final diagnoses:  Shortness of breath    ED Discharge Orders    None       Terrilee Files, MD 01/04/19 1750

## 2019-01-04 NOTE — ED Triage Notes (Signed)
Pt arrives POV as referral from PCP. Pt endorses episodic chest pain w/ associated SOB x 2 weeks. Pt reports he was seen at PCP this AM, EKG was done and when his PCP consulted cards, they advised he present here for further eval. PT denies CP on arrival, endorses weakness and states he has been having some pre-syncopal episodes.

## 2019-01-04 NOTE — ED Notes (Signed)
Pt reports he takes his restless leg medication again at 7pm

## 2019-01-05 ENCOUNTER — Inpatient Hospital Stay (HOSPITAL_COMMUNITY): Payer: PRIVATE HEALTH INSURANCE

## 2019-01-05 DIAGNOSIS — I2510 Atherosclerotic heart disease of native coronary artery without angina pectoris: Secondary | ICD-10-CM

## 2019-01-05 DIAGNOSIS — I361 Nonrheumatic tricuspid (valve) insufficiency: Secondary | ICD-10-CM

## 2019-01-05 DIAGNOSIS — I493 Ventricular premature depolarization: Principal | ICD-10-CM

## 2019-01-05 LAB — BASIC METABOLIC PANEL
Anion gap: 13 (ref 5–15)
BUN: 17 mg/dL (ref 8–23)
CO2: 20 mmol/L — ABNORMAL LOW (ref 22–32)
Calcium: 9 mg/dL (ref 8.9–10.3)
Chloride: 106 mmol/L (ref 98–111)
Creatinine, Ser: 0.91 mg/dL (ref 0.61–1.24)
GFR calc Af Amer: >60 mL/min (ref >60)
GFR calc non Af Amer: >60 mL/min (ref >60)
Glucose, Bld: 109 mg/dL — ABNORMAL HIGH (ref 70–99)
Potassium: 4.2 mmol/L (ref 3.5–5.1)
Sodium: 139 mmol/L (ref 135–145)

## 2019-01-05 LAB — HIV ANTIBODY (ROUTINE TESTING W REFLEX): HIV Screen 4th Generation wRfx: NONREACTIVE

## 2019-01-05 LAB — GLUCOSE, CAPILLARY
Glucose-Capillary: 115 mg/dL — ABNORMAL HIGH (ref 70–99)
Glucose-Capillary: 124 mg/dL — ABNORMAL HIGH (ref 70–99)
Glucose-Capillary: 90 mg/dL (ref 70–99)
Glucose-Capillary: 122 mg/dL — ABNORMAL HIGH (ref 70–99)

## 2019-01-05 LAB — CBC
HCT: 48.2 % (ref 39.0–52.0)
Hemoglobin: 16.5 g/dL (ref 13.0–17.0)
MCH: 31.9 pg (ref 26.0–34.0)
MCHC: 34.2 g/dL (ref 30.0–36.0)
MCV: 93.2 fL (ref 80.0–100.0)
Platelets: 120 10*3/uL — ABNORMAL LOW (ref 150–400)
RBC: 5.17 MIL/uL (ref 4.22–5.81)
RDW: 13.6 % (ref 11.5–15.5)
WBC: 8.5 10*3/uL (ref 4.0–10.5)
nRBC: 0 % (ref 0.0–0.2)

## 2019-01-05 LAB — LIPID PANEL
Cholesterol: 188 mg/dL (ref 0–200)
HDL: 33 mg/dL — ABNORMAL LOW (ref >40)
LDL Cholesterol: 88 mg/dL (ref 0–99)
Total CHOL/HDL Ratio: 5.7 RATIO
Triglycerides: 336 mg/dL — ABNORMAL HIGH (ref <150)
VLDL: 67 mg/dL — ABNORMAL HIGH (ref 0–40)

## 2019-01-05 LAB — HEPARIN LEVEL (UNFRACTIONATED)
Heparin Unfractionated: 0.23 IU/mL — ABNORMAL LOW (ref 0.30–0.70)
Heparin Unfractionated: 0.26 IU/mL — ABNORMAL LOW (ref 0.30–0.70)
Heparin Unfractionated: 0.36 IU/mL (ref 0.30–0.70)

## 2019-01-05 LAB — ECHOCARDIOGRAM COMPLETE
Height: 70 in
Weight: 3355.2 oz

## 2019-01-05 LAB — TROPONIN I
Troponin I: <0.03 ng/mL (ref <0.03)
Troponin I: <0.03 ng/mL (ref <0.03)

## 2019-01-05 MED ORDER — PERFLUTREN LIPID MICROSPHERE
1.0000 mL | INTRAVENOUS | Status: AC | PRN
  Administered 2019-01-05: 6 mL via INTRAVENOUS
  Filled 2019-01-05: qty 10

## 2019-01-05 MED ORDER — SODIUM CHLORIDE 0.9 % IV SOLN: 250.0000 mL | INTRAVENOUS | Status: DC | PRN

## 2019-01-05 MED ORDER — SODIUM CHLORIDE 0.9% FLUSH: 3.0000 mL | Freq: Two times a day (BID) | INTRAVENOUS | Status: DC

## 2019-01-05 MED ORDER — SODIUM CHLORIDE 0.9% FLUSH: 3.0000 mL | INTRAVENOUS | Status: DC | PRN

## 2019-01-05 MED ORDER — ASPIRIN 81 MG PO CHEW
81.0000 mg | CHEWABLE_TABLET | ORAL | Status: AC
  Administered 2019-01-06: 81 mg via ORAL
  Filled 2019-01-05: qty 1

## 2019-01-05 MED ORDER — SODIUM CHLORIDE 0.9 % IV SOLN
INTRAVENOUS | Status: DC
  Administered 2019-01-05: 23:00:00 via INTRAVENOUS

## 2019-01-05 MED FILL — Perflutren Lipid Microsphere IV Susp 1.1 MG/ML: INTRAVENOUS | Qty: 10 | Status: AC

## 2019-01-05 NOTE — Progress Notes (Signed)
ANTICOAGULATION CONSULT NOTE  Pharmacy Consult for Heparin Indication: chest pain/ACS  Allergies  Allergen Reactions  . Brilinta [Ticagrelor] Other (See Comments)    Intolerable dyspnea-changed to Plavix  . Codeine Other (See Comments)    Lightheaded, vision issues  . Lipitor [Atorvastatin Calcium] Other (See Comments)    Muscle pain  . Niaspan [Niacin Er] Other (See Comments)    Head hurts    Patient Measurements: Height: 5\' 10"  (177.8 cm) Weight: 209 lb 11.2 oz (95.1 kg) IBW/kg (Calculated) : 73 HEPARIN DW (KG): 93   Vital Signs: Temp: 97.4 F (36.3 C) (05/20 0430) Temp Source: Oral (05/20 0430) BP: 125/66 (05/20 0715) Pulse Rate: 61 (05/20 0715)  Labs: Recent Labs    01/04/19 1353 01/04/19 1711 01/04/19 2335 01/05/19 0403 01/05/19 0628 01/05/19 1252  HGB 16.8  --   --  16.5  --   --   HCT 49.6  --   --  48.2  --   --   PLT 122*  --   --  120*  --   --   HEPARINUNFRC  --   --  0.26*  --  0.23* 0.36  CREATININE 0.99  --   --  0.91  --   --   TROPONINI <0.03 <0.03 <0.03 <0.03  --   --     Estimated Creatinine Clearance: 96.1 mL/min (by C-G formula based on SCr of 0.91 mg/dL).   Assessment: Pt is a 65 yoM with a history of CAD with multiple stents. He presented with episodic chest pain with shortness of breath.  Initial troponin undetectable. CBC WNL.   Heparin level therapeutic this PM  Goal of Therapy:  Heparin level 0.3-0.7 units/ml Monitor platelets by anticoagulation protocol: Yes   Plan:  Continue heparin at 1250 units / hr Daily heparin level, CBC  Thank you Okey Regal, PharmD Please check AMION for all [facility] Pharmacy numbers 01/05/2019 2:13 PM

## 2019-01-05 NOTE — H&P (View-Only) (Signed)
The patient has been seen in conjunction with Laverda Page, NP. All aspects of care have been considered and discussed. The patient has been personally interviewed, examined, and all clinical data has been reviewed.   I briefly reviewed the echo.  EF is about the same or slightly worse as before.  All of the data has been summarized as below.  Frequent uniform PVCs are noted.  Exertional fatigue and dyspnea is not related to PE.  Plan right and left heart catheterization with coronary angiography determine LV filling pressures and status of coronary arteries.  If filling pressures are high despite absence of clinical data to support, more aggressive diuresis would be helpful.  If filling pressures are relatively normal, perhaps ventricular ectopy has a role related to LV dysfunction and could be suppressed by ablation to help improve LV systolic function.  The patient was counseled to undergo left heart catheterization, coronary angiography, and possible percutaneous coronary intervention with stent implantation. The procedural risks and benefits were discussed in detail. The risks discussed included death, stroke, myocardial infarction, life-threatening bleeding, limb ischemia, kidney injury, allergy, and possible emergency cardiac surgery. The risk of these significant complications were estimated to occur less than 1% of the time. After discussion, the patient has agreed to proceed.      Progress Note  Patient Name: [name] Date of Encounter: 01/05/2019  Primary Cardiologist: Rollene Rotunda, MD   Subjective   Exertional fatigue without chest pain/his typical angina.  Main complaint is severe exertional fatigue and dyspnea. Cardiac markers are negative, frequent isolated PVC's are noted on telemetry. Severely limiting quality of life.  Inpatient Medications    Scheduled Meds: . aspirin  81 mg Oral QPM  . canagliflozin  100 mg Oral QAC breakfast  . carvedilol  3.125  mg Oral BID WC  . clopidogrel  75 mg Oral Daily  . insulin aspart  0-5 Units Subcutaneous QHS  . insulin aspart  0-9 Units Subcutaneous TID WC  . insulin glargine  10 Units Subcutaneous Daily  . magnesium gluconate  500 mg Oral BID  . pantoprazole  40 mg Oral QPM  . pramipexole  0.5 mg Oral QID  . sacubitril-valsartan  1 tablet Oral BID  . spironolactone  12.5 mg Oral Daily   Continuous Infusions: . sodium chloride 10 mL/hr at 01/04/19 1854  . heparin 1,250 Units/hr (01/05/19 0715)   PRN Meds: acetaminophen, albuterol, ALPRAZolam, nitroGLYCERIN, ondansetron (ZOFRAN) IV   Vital Signs    Vitals:   01/04/19 1826 01/04/19 1928 01/05/19 0430 01/05/19 0442  BP: 130/64 109/75 119/81   Pulse:  68 (!) 59   Resp: Temp: 97.6 F (36.4 C)  (!) 97.4 F (36.3 C)   TempSrc: Oral  Oral   SpO2: 96% 93% 93%   Weight: 97.1 kg   95.1 kg  Height:  (1.778 m)       Intake/Output Summary (Last 24 hours) at 01/05/2019 0811 Last data filed at 01/04/2019 2300 Gross per 24 hour  Intake -  Output 350 ml  Net -350 ml   Last 3 Weights 01/05/2019 01/04/2019 01/04/2019  Weight (lbs) 209 lb 11.2 oz 214 lb 214 lb  Weight (kg) 95.119 kg 97.07 kg 97.07 kg      Telemetry    Sinus rhythm with frequent uniform PVC's - Personally Reviewed  ECG   s NSR with PVC's, uniform. - Personally Reviewed  Physical Exam  Obese GEN: No acute distress.  Neck: No significant JVD Cardiac: IRR, no murmurs, rubs, or gallops.  Respiratory: Clear to auscultation bilaterally. GI: Soft, nontender, non-distended  MS: No edema; No deformity. Neuro:  Nonfocal  Psych: Normal affect   Labs    Chemistry Recent Labs  Lab 01/04/19 1353 01/04/19 1711 01/05/19 0403  NA 140  --  139  K 4.4  --  4.2  CL 103  --  106  CO2 24  --  20*  GLUCOSE 121*  --  109*  BUN 16  --  17  CREATININE 0.99  --  0.91  CALCIUM 9.6  --  9.0  PROT  --  7.0  --   ALBUMIN  --  3.8  --   AST  --  29  --   ALT  --  46*   --   ALKPHOS  --  44  --   BILITOT  --  0.7  --   GFRNONAA >60  --  >60  GFRAA >60  --  >60  ANIONGAP 13  --  13     Hematology Recent Labs  Lab 01/04/19 1353 01/05/19 0403  WBC 8.8 8.5  RBC 5.25 5.17  HGB 16.8 16.5  HCT 49.6 48.2  MCV 94.5 93.2  MCH 32.0 31.9  MCHC 33.9 34.2  RDW 13.5 13.6  PLT 122* 120*    Cardiac Enzymes Recent Labs  Lab 01/04/19 1353 01/04/19 1711 01/04/19 2335 01/05/19 0403  TROPONINI <0.03 <0.03 <0.03 <0.03   No results for input(s): TROPIPOC in the last 168 hours.   BNP Recent Labs  Lab 01/04/19 1433  BNP 187.1*     DDimer  Recent Labs  Lab 01/04/19 1724  DDIMER 0.33     Radiology    Dg Chest 2 View  Result Date: 01/04/2019 CLINICAL DATA:  Chest pain and shortness of breath with fatigue. EXAM: CHEST - 2 VIEW COMPARISON:  07/20/2018. FINDINGS: Cardiomegaly. Single lead pacer good position. Chronic interstitial prominence, without superimposed consolidation or edema. No effusion or pneumothorax. IMPRESSION: 1. Cardiomegaly. No active disease. 2. Chronic interstitial prominence, without superimposed consolidation or edema. Stable appearance from priors. Electronically Signed   By: Elsie Stain M.D.   On: 01/04/2019 13:28   Ct Angio Chest Pe W Or Wo Contrast  Result Date: 01/04/2019 CLINICAL DATA:  Shortness of breath.  Concern for PE. EXAM: CT ANGIOGRAPHY CHEST WITH CONTRAST TECHNIQUE: Multidetector CT imaging of the chest was performed using the standard protocol during bolus administration of intravenous contrast. Multiplanar CT image reconstructions and MIPs were obtained to evaluate the vascular anatomy. CONTRAST:  OMNIPAQUE IOHEXOL 350 MG/ML SOLN COMPARISON:  Correlation made with chest x-ray from same day. FINDINGS: Cardiovascular: The main pulmonary artery is borderline dilated measuring up to 3 cm. The heart is enlarged. Coronary artery calcifications are noted. There is motion artifact that limits detection of segmental and  subsegmental pulmonary emboli. There is reflux of contrast into the IVC consistent was some degree of underlying cardiac dysfunction. Mediastinum/Nodes: No enlarged mediastinal, hilar, or axillary lymph nodes. Thyroid gland, trachea, and esophagus demonstrate no significant findings. Lungs/Pleura: There are upper lobe predominant emphysematous changes. There is bronchiectasis within the bilateral upper lobes. Upper Abdomen: There is cholelithiasis. Otherwise, the upper abdomen is unremarkable. Musculoskeletal: No chest wall abnormality. No acute or significant osseous findings. Review of the MIP images confirms the above findings. IMPRESSION: 1. No PE identified, however evaluation was somewhat limited by respiratory motion artifact. The main pulmonary artery is borderline dilated  which can be seen in patients with elevated PA pressures. 2. Cardiomegaly with reflux of contrast into the IVC consistent with underlying cardiac dysfunction. 3. Cholelithiasis. Aortic Atherosclerosis (ICD10-I70.0) and Emphysema (ICD10-J43.9). Electronically Signed   By: Katherine Mantle M.D.   On: 01/04/2019 21:01    Cardiac Studies   TTE:  Patient Profile     64 y.o. male with hx of CAD with multiple stents, HTN, HLD, OSA and DM-2 and ICD placed 07/19/18 with MDT ICD now admitted for DOE, and multiple PVCs.    Assessment & Plan    1. Dyspnea: troponins negative. D-dimer neg, and CTA without evidence of PE but limited evaluation 2/2 to artifact. -- echo pending  2. CAD: hx of multiple stents, most recent with cath 5/19 with DES to pLCx. Known occluded RCA with collaterals.  -- on ASA and plavix  3. ICM s/p ICD: MDT ('19). EF 25-30% historically -- on entresto, coreg and spiro. No overt signs of volume overload on admission. BNP mildly elevated at 187. No edema on CXR  4. DM: on jardiance, metformin and insulin.  -- metformin held with SSI ordered  5. HL: hx of statin intolerance. Options discussed with PCP  cardiologist but patient declined on last visit. LDL 88  For questions or updates, please contact [facility] Please consult [email or web address] for contact info under        Signed, Laverda Page, NP  01/05/2019, 8:11 AM

## 2019-01-05 NOTE — Progress Notes (Signed)
  Echocardiogram 2D Echocardiogram with Definity has been performed.  [name] 01/05/2019, 1:58 PM

## 2019-01-05 NOTE — Progress Notes (Signed)
Pt HR drops into the 30s at times, non-sustained. Pt has been awake during the episodes, asymptomatic. On low dose Coreg. Will continue to monitor closely.

## 2019-01-05 NOTE — Progress Notes (Addendum)
ANTICOAGULATION CONSULT NOTE - Initial Consult  Pharmacy Consult for Heparin Indication: chest pain/ACS  Allergies  Allergen Reactions  . Brilinta [Ticagrelor] Other (See Comments)    Intolerable dyspnea-changed to Plavix  . Codeine Other (See Comments)    Lightheaded, vision issues  . Lipitor [Atorvastatin Calcium] Other (See Comments)    Muscle pain  . Niaspan [Niacin Er] Other (See Comments)    Head hurts    Patient Measurements: Height: 5\' 10"  (177.8 cm) Weight: 214 lb (97.1 kg) IBW/kg (Calculated) : 73 HEPARIN DW (KG): 93   Vital Signs: Temp: 97.6 F (36.4 C) (05/19 1826) Temp Source: Oral (05/19 1826) BP: 109/75 (05/19 1928) Pulse Rate: 68 (05/19 1928)  Labs: Recent Labs    01/04/19 1353 01/04/19 1711 01/04/19 2335  HGB 16.8  --   --   HCT 49.6  --   --   PLT 122*  --   --   HEPARINUNFRC  --   --  0.26*  CREATININE 0.99  --   --   TROPONINI <0.03 <0.03  --     Estimated Creatinine Clearance: 89.2 mL/min (by C-G formula based on SCr of 0.99 mg/dL).   Medical History: Past Medical History:  Diagnosis Date  . ASCVD (arteriosclerotic cardiovascular disease)    a. MI 2004, did not seek care at that time. b. s/p PTCA/BMS to OM1 04/2011 (known totally occluded RCA with L-R collaterals and failed angioplasty on that vessel). c.)  Cath 2019 chronically occluded right coronary artery with collateralization.  90% circumflex stenosis treated with a DES.  Marland Kitchen Chronic bronchitis (HCC)   . HTN (hypertension)   . Hyperlipidemia   . Ischemic cardiomyopathy    EF 30%.    . OSA (obstructive sleep apnea) 05/24/11  . Restless leg syndrome 06/25/2011  . Type 2 diabetes mellitus (HCC)     Assessment: Pt is a 74 yoM with a history of CAD with multiple stents. He presented with episodic chest pain with shortness of breath.  Initial troponin undetectable. CBC WNL.   Initial heparin level subtherapeutic at 0.26  Goal of Therapy:  Heparin level 0.3-0.7 units/ml Monitor  platelets by anticoagulation protocol: Yes   Plan:  Increase heparin gtt to 1250 units/hr F/u 6 hour heparin level  Addendum: AM heparin level remains subtherapeutic, however rate not increased per RN report until 0722 Will confirm with re-timed 6h level  Daylene Posey, PharmD Clinical Pharmacist Please check AMION for all [facility] Pharmacy numbers 01/05/2019 12:26 AM

## 2019-01-05 NOTE — Progress Notes (Signed)
 The patient has been seen in conjunction with Lindsay Roberts, NP. All aspects of care have been considered and discussed. The patient has been personally interviewed, examined, and all clinical data has been reviewed.   I briefly reviewed the echo.  EF is about the same or slightly worse as before.  All of the data has been summarized as below.  Frequent uniform PVCs are noted.  Exertional fatigue and dyspnea is not related to PE.  Plan right and left heart catheterization with coronary angiography determine LV filling pressures and status of coronary arteries.  If filling pressures are high despite absence of clinical data to support, more aggressive diuresis would be helpful.  If filling pressures are relatively normal, perhaps ventricular ectopy has a role related to LV dysfunction and could be suppressed by ablation to help improve LV systolic function.  The patient was counseled to undergo left heart catheterization, coronary angiography, and possible percutaneous coronary intervention with stent implantation. The procedural risks and benefits were discussed in detail. The risks discussed included death, stroke, myocardial infarction, life-threatening bleeding, limb ischemia, kidney injury, allergy, and possible emergency cardiac surgery. The risk of these significant complications were estimated to occur less than 1% of the time. After discussion, the patient has agreed to proceed.      Progress Note  Patient Name: [name] Date of Encounter: 01/05/2019  Primary Cardiologist: James Hochrein, MD   Subjective   Exertional fatigue without chest pain/his typical angina.  Main complaint is severe exertional fatigue and dyspnea. Cardiac markers are negative, frequent isolated PVC's are noted on telemetry. Severely limiting quality of life.  Inpatient Medications    Scheduled Meds: . aspirin  81 mg Oral QPM  . canagliflozin  100 mg Oral QAC breakfast  . carvedilol  3.125  mg Oral BID WC  . clopidogrel  75 mg Oral Daily  . insulin aspart  0-5 Units Subcutaneous QHS  . insulin aspart  0-9 Units Subcutaneous TID WC  . insulin glargine  10 Units Subcutaneous Daily  . magnesium gluconate  500 mg Oral BID  . pantoprazole  40 mg Oral QPM  . pramipexole  0.5 mg Oral QID  . sacubitril-valsartan  1 tablet Oral BID  . spironolactone  12.5 mg Oral Daily   Continuous Infusions: . sodium chloride 10 mL/hr at 01/04/19 1854  . heparin 1,250 Units/hr (01/05/19 0715)   PRN Meds: acetaminophen, albuterol, ALPRAZolam, nitroGLYCERIN, ondansetron (ZOFRAN) IV   Vital Signs    Vitals:   01/04/19 1826 01/04/19 1928 01/05/19 0430 01/05/19 0442  BP: 130/64 109/75 119/81   Pulse:  68 (!) 59   Resp: 16 20 19   Temp: 97.6 F (36.4 C)  (!) 97.4 F (36.3 C)   TempSrc: Oral  Oral   SpO2: 96% 93% 93%   Weight: 97.1 kg   95.1 kg  Height: 5' 10" (1.778 m)       Intake/Output Summary (Last 24 hours) at 01/05/2019 0811 Last data filed at 01/04/2019 2300 Gross per 24 hour  Intake -  Output 350 ml  Net -350 ml   Last 3 Weights 01/05/2019 01/04/2019 01/04/2019  Weight (lbs) 209 lb 11.2 oz 214 lb 214 lb  Weight (kg) 95.119 kg 97.07 kg 97.07 kg      Telemetry    Sinus rhythm with frequent uniform PVC's - Personally Reviewed  ECG   s NSR with PVC's, uniform. - Personally Reviewed  Physical Exam  Obese GEN: No acute distress.     Neck: No significant JVD Cardiac: IRR, no murmurs, rubs, or gallops.  Respiratory: Clear to auscultation bilaterally. GI: Soft, nontender, non-distended  MS: No edema; No deformity. Neuro:  Nonfocal  Psych: Normal affect   Labs    Chemistry Recent Labs  Lab 01/04/19 1353 01/04/19 1711 01/05/19 0403  NA 140  --  139  K 4.4  --  4.2  CL 103  --  106  CO2 24  --  20*  GLUCOSE 121*  --  109*  BUN 16  --  17  CREATININE 0.99  --  0.91  CALCIUM 9.6  --  9.0  PROT  --  7.0  --   ALBUMIN  --  3.8  --   AST  --  29  --   ALT  --  46*   --   ALKPHOS  --  44  --   BILITOT  --  0.7  --   GFRNONAA >60  --  >60  GFRAA >60  --  >60  ANIONGAP 13  --  13     Hematology Recent Labs  Lab 01/04/19 1353 01/05/19 0403  WBC 8.8 8.5  RBC 5.25 5.17  HGB 16.8 16.5  HCT 49.6 48.2  MCV 94.5 93.2  MCH 32.0 31.9  MCHC 33.9 34.2  RDW 13.5 13.6  PLT 122* 120*    Cardiac Enzymes Recent Labs  Lab 01/04/19 1353 01/04/19 1711 01/04/19 2335 01/05/19 0403  TROPONINI <0.03 <0.03 <0.03 <0.03   No results for input(s): TROPIPOC in the last 168 hours.   BNP Recent Labs  Lab 01/04/19 1433  BNP 187.1*     DDimer  Recent Labs  Lab 01/04/19 1724  DDIMER 0.33     Radiology    Dg Chest 2 View  Result Date: 01/04/2019 CLINICAL DATA:  Chest pain and shortness of breath with fatigue. EXAM: CHEST - 2 VIEW COMPARISON:  07/20/2018. FINDINGS: Cardiomegaly. Single lead pacer good position. Chronic interstitial prominence, without superimposed consolidation or edema. No effusion or pneumothorax. IMPRESSION: 1. Cardiomegaly. No active disease. 2. Chronic interstitial prominence, without superimposed consolidation or edema. Stable appearance from priors. Electronically Signed   By: Elsie Stain M.D.   On: 01/04/2019 13:28   Ct Angio Chest Pe W Or Wo Contrast  Result Date: 01/04/2019 CLINICAL DATA:  Shortness of breath.  Concern for PE. EXAM: CT ANGIOGRAPHY CHEST WITH CONTRAST TECHNIQUE: Multidetector CT imaging of the chest was performed using the standard protocol during bolus administration of intravenous contrast. Multiplanar CT image reconstructions and MIPs were obtained to evaluate the vascular anatomy. CONTRAST:  OMNIPAQUE IOHEXOL 350 MG/ML SOLN COMPARISON:  Correlation made with chest x-ray from same day. FINDINGS: Cardiovascular: The main pulmonary artery is borderline dilated measuring up to 3 cm. The heart is enlarged. Coronary artery calcifications are noted. There is motion artifact that limits detection of segmental and  subsegmental pulmonary emboli. There is reflux of contrast into the IVC consistent was some degree of underlying cardiac dysfunction. Mediastinum/Nodes: No enlarged mediastinal, hilar, or axillary lymph nodes. Thyroid gland, trachea, and esophagus demonstrate no significant findings. Lungs/Pleura: There are upper lobe predominant emphysematous changes. There is bronchiectasis within the bilateral upper lobes. Upper Abdomen: There is cholelithiasis. Otherwise, the upper abdomen is unremarkable. Musculoskeletal: No chest wall abnormality. No acute or significant osseous findings. Review of the MIP images confirms the above findings. IMPRESSION: 1. No PE identified, however evaluation was somewhat limited by respiratory motion artifact. The main pulmonary artery is borderline dilated  which can be seen in patients with elevated PA pressures. 2. Cardiomegaly with reflux of contrast into the IVC consistent with underlying cardiac dysfunction. 3. Cholelithiasis. Aortic Atherosclerosis (ICD10-I70.0) and Emphysema (ICD10-J43.9). Electronically Signed   By: Katherine Mantle M.D.   On: 01/04/2019 21:01    Cardiac Studies   TTE:  Patient Profile     64 y.o. male with hx of CAD with multiple stents, HTN, HLD, OSA and DM-2 and ICD placed 07/19/18 with MDT ICD now admitted for DOE, and multiple PVCs.    Assessment & Plan    1. Dyspnea: troponins negative. D-dimer neg, and CTA without evidence of PE but limited evaluation 2/2 to artifact. -- echo pending  2. CAD: hx of multiple stents, most recent with cath 5/19 with DES to pLCx. Known occluded RCA with collaterals.  -- on ASA and plavix  3. ICM s/p ICD: MDT ('19). EF 25-30% historically -- on entresto, coreg and spiro. No overt signs of volume overload on admission. BNP mildly elevated at 187. No edema on CXR  4. DM: on jardiance, metformin and insulin.  -- metformin held with SSI ordered  5. HL: hx of statin intolerance. Options discussed with PCP  cardiologist but patient declined on last visit. LDL 88  For questions or updates, please contact [facility] Please consult [email or web address] for contact info under        Signed, Laverda Page, NP  01/05/2019, 8:11 AM

## 2019-01-06 ENCOUNTER — Telehealth: Payer: Self-pay | Admitting: Interventional Cardiology

## 2019-01-06 ENCOUNTER — Other Ambulatory Visit: Payer: Self-pay | Admitting: Cardiology

## 2019-01-06 ENCOUNTER — Telehealth: Payer: Self-pay | Admitting: Cardiology

## 2019-01-06 ENCOUNTER — Inpatient Hospital Stay (HOSPITAL_COMMUNITY)
Admission: EM | Disposition: A | Discharge status: Home / Self Care | Payer: Self-pay | Source: Home / Self Care | Attending: Interventional Cardiology

## 2019-01-06 ENCOUNTER — Encounter (HOSPITAL_COMMUNITY): Payer: Self-pay | Admitting: Cardiovascular Disease

## 2019-01-06 DIAGNOSIS — I25110 Atherosclerotic heart disease of native coronary artery with unstable angina pectoris: Secondary | ICD-10-CM

## 2019-01-06 DIAGNOSIS — I493 Ventricular premature depolarization: Secondary | ICD-10-CM

## 2019-01-06 DIAGNOSIS — I255 Ischemic cardiomyopathy: Secondary | ICD-10-CM

## 2019-01-06 HISTORY — PX: RIGHT/LEFT HEART CATH AND CORONARY ANGIOGRAPHY: CATH118266

## 2019-01-06 LAB — POCT I-STAT 7, (LYTES, BLD GAS, ICA,H+H)
Acid-base deficit: 4 mmol/L — ABNORMAL HIGH (ref 0.0–2.0)
Bicarbonate: 20.4 mmol/L (ref 20.0–28.0)
Calcium, Ion: 1.21 mmol/L (ref 1.15–1.40)
HCT: 50 % (ref 39.0–52.0)
Hemoglobin: 17 g/dL (ref 13.0–17.0)
O2 Saturation: 93 %
Potassium: 4.2 mmol/L (ref 3.5–5.1)
Sodium: 140 mmol/L (ref 135–145)
TCO2: 22 mmol/L (ref 22–32)
pCO2 arterial: 35.7 mmHg (ref 32.0–48.0)
pH, Arterial: 7.366 (ref 7.350–7.450)
pO2, Arterial: 70 mmHg — ABNORMAL LOW (ref 83.0–108.0)

## 2019-01-06 LAB — CBC
HCT: 50.6 % (ref 39.0–52.0)
Hemoglobin: 17.5 g/dL — ABNORMAL HIGH (ref 13.0–17.0)
MCH: 32.3 pg (ref 26.0–34.0)
MCHC: 34.6 g/dL (ref 30.0–36.0)
MCV: 93.4 fL (ref 80.0–100.0)
Platelets: 127 10*3/uL — ABNORMAL LOW (ref 150–400)
RBC: 5.42 MIL/uL (ref 4.22–5.81)
RDW: 13.7 % (ref 11.5–15.5)
WBC: 9 10*3/uL (ref 4.0–10.5)
nRBC: 0 % (ref 0.0–0.2)

## 2019-01-06 LAB — BASIC METABOLIC PANEL
Anion gap: 12 (ref 5–15)
BUN: 16 mg/dL (ref 8–23)
CO2: 20 mmol/L — ABNORMAL LOW (ref 22–32)
Calcium: 9.6 mg/dL (ref 8.9–10.3)
Chloride: 107 mmol/L (ref 98–111)
Creatinine, Ser: 1.04 mg/dL (ref 0.61–1.24)
GFR calc Af Amer: >60 mL/min (ref >60)
GFR calc non Af Amer: >60 mL/min (ref >60)
Glucose, Bld: 136 mg/dL — ABNORMAL HIGH (ref 70–99)
Potassium: 4.2 mmol/L (ref 3.5–5.1)
Sodium: 139 mmol/L (ref 135–145)

## 2019-01-06 LAB — GLUCOSE, CAPILLARY
Glucose-Capillary: 129 mg/dL — ABNORMAL HIGH (ref 70–99)
Glucose-Capillary: 174 mg/dL — ABNORMAL HIGH (ref 70–99)

## 2019-01-06 LAB — POCT I-STAT EG7
Acid-base deficit: 2 mmol/L (ref 0.0–2.0)
Acid-base deficit: 3 mmol/L — ABNORMAL HIGH (ref 0.0–2.0)
Bicarbonate: 22.1 mmol/L (ref 20.0–28.0)
Bicarbonate: 23.2 mmol/L (ref 20.0–28.0)
Calcium, Ion: 1.2 mmol/L (ref 1.15–1.40)
Calcium, Ion: 1.24 mmol/L (ref 1.15–1.40)
HCT: 51 % (ref 39.0–52.0)
HCT: 52 % (ref 39.0–52.0)
Hemoglobin: 17.3 g/dL — ABNORMAL HIGH (ref 13.0–17.0)
Hemoglobin: 17.7 g/dL — ABNORMAL HIGH (ref 13.0–17.0)
O2 Saturation: 62 %
O2 Saturation: 65 %
Potassium: 4.1 mmol/L (ref 3.5–5.1)
Potassium: 4.1 mmol/L (ref 3.5–5.1)
Sodium: 142 mmol/L (ref 135–145)
Sodium: 142 mmol/L (ref 135–145)
TCO2: 23 mmol/L (ref 22–32)
TCO2: 24 mmol/L (ref 22–32)
pCO2, Ven: 38.3 mmHg — ABNORMAL LOW (ref 44.0–60.0)
pCO2, Ven: 39.1 mmHg — ABNORMAL LOW (ref 44.0–60.0)
pH, Ven: 7.37 (ref 7.250–7.430)
pH, Ven: 7.382 (ref 7.250–7.430)
pO2, Ven: 33 mmHg (ref 32.0–45.0)
pO2, Ven: 34 mmHg (ref 32.0–45.0)

## 2019-01-06 LAB — HEPARIN LEVEL (UNFRACTIONATED): Heparin Unfractionated: 0.33 IU/mL (ref 0.30–0.70)

## 2019-01-06 SURGERY — RIGHT/LEFT HEART CATH AND CORONARY ANGIOGRAPHY
Anesthesia: LOCAL

## 2019-01-06 MED ORDER — HEPARIN SODIUM (PORCINE) 1000 UNIT/ML IJ SOLN
INTRAMUSCULAR | Status: AC
  Filled 2019-01-06: qty 1

## 2019-01-06 MED ORDER — VERAPAMIL HCL 2.5 MG/ML IV SOLN
INTRAVENOUS | Status: AC
  Filled 2019-01-06: qty 2

## 2019-01-06 MED ORDER — LIDOCAINE HCL (PF) 1 % IJ SOLN
INTRAMUSCULAR | Status: AC
  Filled 2019-01-06: qty 30

## 2019-01-06 MED ORDER — HEPARIN (PORCINE) IN NACL 1000-0.9 UT/500ML-% IV SOLN
INTRAVENOUS | Status: AC
  Filled 2019-01-06: qty 1000

## 2019-01-06 MED ORDER — ASPIRIN 81 MG PO CHEW: 81.0000 mg | CHEWABLE_TABLET | Freq: Every day | ORAL | Status: DC

## 2019-01-06 MED ORDER — LABETALOL HCL 5 MG/ML IV SOLN: 10.0000 mg | INTRAVENOUS | Status: AC | PRN

## 2019-01-06 MED ORDER — CLOPIDOGREL BISULFATE 75 MG PO TABS: 75.0000 mg | ORAL_TABLET | Freq: Every day | ORAL | Status: DC

## 2019-01-06 MED ORDER — ACETAMINOPHEN 325 MG PO TABS: 650.0000 mg | ORAL_TABLET | ORAL | Status: DC | PRN

## 2019-01-06 MED ORDER — IOHEXOL 350 MG/ML SOLN
INTRAVENOUS | Status: DC | PRN
  Administered 2019-01-06: 09:00:00 55 mL via INTRACARDIAC

## 2019-01-06 MED ORDER — SODIUM CHLORIDE 0.9% FLUSH: 3.0000 mL | INTRAVENOUS | Status: DC | PRN

## 2019-01-06 MED ORDER — HYDRALAZINE HCL 20 MG/ML IJ SOLN: 10.0000 mg | INTRAMUSCULAR | Status: AC | PRN

## 2019-01-06 MED ORDER — FENTANYL CITRATE (PF) 100 MCG/2ML IJ SOLN
INTRAMUSCULAR | Status: DC | PRN
  Administered 2019-01-06: 25 ug via INTRAVENOUS

## 2019-01-06 MED ORDER — HEPARIN (PORCINE) IN NACL 1000-0.9 UT/500ML-% IV SOLN
INTRAVENOUS | Status: DC | PRN
  Administered 2019-01-06 (×2): 500 mL

## 2019-01-06 MED ORDER — FENTANYL CITRATE (PF) 100 MCG/2ML IJ SOLN
INTRAMUSCULAR | Status: AC
  Filled 2019-01-06: qty 2

## 2019-01-06 MED ORDER — ONDANSETRON HCL 4 MG/2ML IJ SOLN: 4.0000 mg | Freq: Four times a day (QID) | INTRAMUSCULAR | Status: DC | PRN

## 2019-01-06 MED ORDER — SODIUM CHLORIDE 0.9% FLUSH: 3.0000 mL | Freq: Two times a day (BID) | INTRAVENOUS | Status: DC

## 2019-01-06 MED ORDER — SODIUM CHLORIDE 0.9 % IV SOLN
INTRAVENOUS | Status: AC | PRN
  Administered 2019-01-06: 250 mL via INTRAVENOUS

## 2019-01-06 MED ORDER — LIDOCAINE HCL (PF) 1 % IJ SOLN
INTRAMUSCULAR | Status: DC | PRN
  Administered 2019-01-06 (×2): 2 mL
  Administered 2019-01-06: 15 mL

## 2019-01-06 MED ORDER — SODIUM CHLORIDE 0.9 % IV SOLN: INTRAVENOUS | Status: AC

## 2019-01-06 MED ORDER — ATROPINE SULFATE 1 MG/10ML IJ SOSY
PREFILLED_SYRINGE | INTRAMUSCULAR | Status: AC
  Filled 2019-01-06: qty 10

## 2019-01-06 MED ORDER — SODIUM CHLORIDE 0.9 % IV SOLN: 250.0000 mL | INTRAVENOUS | Status: DC | PRN

## 2019-01-06 MED ORDER — ATROPINE SULFATE 1 MG/10ML IJ SOSY
PREFILLED_SYRINGE | INTRAMUSCULAR | Status: DC | PRN
  Administered 2019-01-06: 0.5 mg via INTRAVENOUS

## 2019-01-06 MED ORDER — ASPIRIN 81 MG PO CHEW: 81.0000 mg | CHEWABLE_TABLET | Freq: Every day | ORAL | Status: AC

## 2019-01-06 SURGICAL SUPPLY — 16 items
CATH BALLN WEDGE 5F 110CM (CATHETERS) ×2 IMPLANT
CATH INFINITI 5FR MULTPACK ANG (CATHETERS) ×2 IMPLANT
CLOSURE MYNX CONTROL 5F (Vascular Products) ×2 IMPLANT
ELECT DEFIB PAD ADLT CADENCE (PAD) ×2 IMPLANT
GLIDESHEATH SLEND A-KIT 6F 22G (SHEATH) ×2 IMPLANT
GUIDEWIRE INQWIRE 1.5J.035X260 (WIRE) ×1 IMPLANT
INQWIRE 1.5J .035X260CM (WIRE) ×2
KIT HEART LEFT (KITS) ×2 IMPLANT
PACK CARDIAC CATHETERIZATION (CUSTOM PROCEDURE TRAY) ×2 IMPLANT
SHEATH GLIDE SLENDER 4/5FR (SHEATH) ×2 IMPLANT
SHEATH PINNACLE 5F 10CM (SHEATH) ×2 IMPLANT
SHEATH PROBE COVER 6X72 (BAG) ×2 IMPLANT
TRANSDUCER W/STOPCOCK (MISCELLANEOUS) ×2 IMPLANT
TUBING CIL FLEX 10 FLL-RA (TUBING) ×2 IMPLANT
WIRE EMERALD 3MM-J .035X150CM (WIRE) ×2 IMPLANT
WIRE HI TORQ VERSACORE-J 145CM (WIRE) ×2 IMPLANT

## 2019-01-06 NOTE — Discharge Summary (Addendum)
The patient has been seen in conjunction with Laverda Page, NP. All aspects of care have been considered and discussed. The patient has been personally interviewed, examined, and all clinical data has been reviewed.   Please see my earlier comments in note this AM.   Discharge Summary    Patient ID: [name],  MRN: [id number], DOB/AGE: [birth year] 63 y.o.  Admit date: 01/04/2019 Discharge date: 01/06/2019  Primary Care Provider: Hal Morales Primary Cardiologist: Dr. Antoine Poche  Discharge Diagnoses    Principal Problem:   Dyspnea on effort Active Problems:   Bradycardia   Cardiomyopathy, ischemic   Allergies Allergies  Allergen Reactions  . Brilinta [Ticagrelor] Other (See Comments)    Intolerable dyspnea-changed to Plavix  . Codeine Other (See Comments)    Lightheaded, vision issues  . Lipitor [Atorvastatin Calcium] Other (See Comments)    Muscle pain  . Niaspan [Niacin Er] Other (See Comments)    Head hurts    Diagnostic Studies/Procedures    TTE: 01/05/19  IMPRESSIONS    1. The left ventricle has severely reduced systolic function, with an ejection fraction of 20-25%. The cavity size was moderately dilated. There is mild concentric left ventricular hypertrophy. Left ventricular diastolic Doppler parameters are  consistent with impaired relaxation. Left ventricular diffuse hypokinesis.  2. The right ventricle has normal systolic function. The cavity was normal. There is no increase in right ventricular wall thickness.  3. Left atrial size was mild-moderately dilated.  4. Right atrial size was severely dilated.  5. No evidence of mitral valve stenosis.  6. No stenosis of the aortic valve.  7. The inferior vena cava was dilated in size with <50% respiratory variability.  Cath: 01/06/19   Ost RCA to Prox RCA lesion is 95% stenosed.  Prox RCA to Mid RCA lesion is 95% stenosed.  Dist RCA lesion is 100% stenosed.  Previously placed Ost 2nd Mrg  to 2nd Mrg stent (unknown type) is widely patent.  Previously placed Ost Cx to Prox Cx stent (unknown type) is widely patent.  Prox Cx lesion is 50% stenosed.  Ost LAD to Prox LAD lesion is 50% stenosed.  Ost 1st Diag to 1st Diag lesion is 95% stenosed.   IMPRESSION: Mr. Mcmartin has essentially unchanged in that anatomy from his prior cath performed by Dr. [place] a year ago at which time a proximal circumflex stent was placed.  His first obtuse marginal branch stent was widely patent.  His RCA was occluded with left-to-right collaterals.  He had low filling pressures.  He did become bradycardic and hypotensive suggesting a vagal response during attempt at right radial access.  This was done under ultrasound guidance but abandoned and right femoral access was easily obtained.  His right femoral puncture site was sealed with a Mynx closure device successfully.  The patient does not appear to be volume overloaded and if anything appears to be a little bit dry.  His symptoms may be related to his PVCs.  Continue medical therapy will be recommended.  The patient may benefit from an EP consult.  He left lab in stable condition.  Nanetta Batty. MD, [facility]  Diagnostic  Dominance: Right     _____________   History of Present Illness     Mr. Pherigo is a 63 yo male with PMH of CAD s/p multiple stents, HTN, HL, OSA, DM and ICD MDT (12/19) who presented with dyspnea and PVCs.  He is s/p prior OM1 PCI with DES in 2012. He  was cathed in 2014 and 2016and was treated withmedical Rx. He presented to [facility]with [place] and was restudied his last cath this revealed a patent OM1 stent, CTO of the RCA with L-R collaterals from the CFX, 90% CFX (treated with PCI-DES) see below for picture, 40% pLAD and 90% small Dx1. His EF was 20-25% by echo. Meds were titrated and repeat Echo with EF did improved to 30%.  He was sent to EP Dr. Elberta Fortis and had ICD placed in Dec.    He had been seeing his PCP for DOE with  minimal exertion and episodic chest pain.  For about 2 weeks.  The day of admission he did not look good and had freq PVCs on EKG. Was instructed to go to the ED. Over last month the dyspnea had increased, he was having to prop up at night to sleep.  Not much swelling per pt, some in abd.  He had lower BP at times. No real chest pain.  No pain in his jaw.     No fevers or colds, has self isolated due to health history for weeks.    EKG:  The ECG that was done 01/04/19 at PCP SR with bigeminy PVCs  And no other acute changes was personally reviewed.  EKGs at [facility] are the same with only occ PVCs.   Troponin <0.03, BNP 187 Na 140, K+ 4.4, BUN 16, Cr 0.99, Hgb 16.8 plts 122  COVID negative  2 V CXR IMPRESSION: 1. Cardiomegaly. No active disease. 2. Chronic interstitial prominence, without superimposed consolidation or edema. Stable appearance from priors.  Given symptoms he was admitted for further work up.   Hospital Course     D-dimer 0.33 and CTA was negative for PE. Follow up echo showed slight further decline in EF to 20-25%. Acute HF not observed on admission as his BNP was only mildly elevated and CXR neg. Decision made to take for [facility] noted above. His anatomy was essential unchanged from prior cath back in 5/19. Also had low filling pressures on cath. Overall concern was that PVC's may be contributing to further decline in EF and symptoms. Initial thought via Dr. Katrinka Blazing for 48 holter but ICD in place. Remote monitoring to be followed via ICD transmissions. Remains on BB therapy but no room to titrate 2/2 to bradycardia. No complications noted post cath. Mynx closure device used at femoral cath site. Able to ambulate. PCP cardiologist to consider for EP consultation as outpatient.   KIRTAN SADA was seen by Dr. Katrinka Blazing and determined stable for discharge home. Follow up in the office has been arranged. Medications are listed below.   _____________  Discharge Vitals Blood pressure  118/87, pulse 51, temperature (!) 97.5 F (36.4 C), temperature source Oral, resp. rate 17, height  (1.778 m), weight 94.1 kg, SpO2 93%.  Filed Weights   01/04/19 1826 01/05/19 0442 01/06/19 0623  Weight: 97.1 kg 95.1 kg 94.1 kg    Labs & Radiologic Studies    CBC Recent Labs    01/05/19 0403 01/06/19 0346  WBC 8.5 9.0  HGB 16.5 17.5*  HCT 48.2 50.6  MCV 93.2 93.4  PLT 120* 127*   Basic Metabolic Panel Recent Labs    11/91/47 1711 01/05/19 0403 01/06/19 0652  NA  --  139 139  K  --  4.2 4.2  CL  --  106 107  CO2  --  20* 20*  GLUCOSE  --  109* 136*  BUN  --  17 16  CREATININE  --  0.91 1.04  CALCIUM  --  9.0 9.6  MG 2.2  --   --    Liver Function Tests Recent Labs    01/04/19 1711  AST 29  ALT 46*  ALKPHOS 44  BILITOT 0.7  PROT 7.0  ALBUMIN 3.8   No results for input(s): LIPASE, AMYLASE in the last 72 hours. Cardiac Enzymes Recent Labs    01/04/19 1711 01/04/19 2335 01/05/19 0403  TROPONINI <0.03 <0.03 <0.03   BNP Invalid input(s): POCBNP D-Dimer Recent Labs    01/04/19 1724  DDIMER 0.33   Hemoglobin A1C No results for input(s): HGBA1C in the last 72 hours. Fasting Lipid Panel Recent Labs    01/05/19 0403  CHOL 188  HDL 33*  LDLCALC 88  TRIG 161336*  CHOLHDL 5.7   Thyroid Function Tests Recent Labs    01/04/19 1711  TSH 2.742   _____________  Dg Chest 2 View  Result Date: 01/04/2019 CLINICAL DATA:  Chest pain and shortness of breath with fatigue. EXAM: CHEST - 2 VIEW COMPARISON:  07/20/2018. FINDINGS: Cardiomegaly. Single lead pacer good position. Chronic interstitial prominence, without superimposed consolidation or edema. No effusion or pneumothorax. IMPRESSION: 1. Cardiomegaly. No active disease. 2. Chronic interstitial prominence, without superimposed consolidation or edema. Stable appearance from priors. Electronically Signed   By: Elsie StainJohn T Curnes M.D.   On: 01/04/2019 13:28   Ct Angio Chest Pe W Or Wo Contrast  Result  Date: 01/04/2019 CLINICAL DATA:  Shortness of breath.  Concern for PE. EXAM: CT ANGIOGRAPHY CHEST WITH CONTRAST TECHNIQUE: Multidetector CT imaging of the chest was performed using the standard protocol during bolus administration of intravenous contrast. Multiplanar CT image reconstructions and MIPs were obtained to evaluate the vascular anatomy. CONTRAST:  100mL OMNIPAQUE IOHEXOL 350 MG/ML SOLN COMPARISON:  Correlation made with chest x-ray from same day. FINDINGS: Cardiovascular: The main pulmonary artery is borderline dilated measuring up to 3 cm. The heart is enlarged. Coronary artery calcifications are noted. There is motion artifact that limits detection of segmental and subsegmental pulmonary emboli. There is reflux of contrast into the IVC consistent was some degree of underlying cardiac dysfunction. Mediastinum/Nodes: No enlarged mediastinal, hilar, or axillary lymph nodes. Thyroid gland, trachea, and esophagus demonstrate no significant findings. Lungs/Pleura: There are upper lobe predominant emphysematous changes. There is bronchiectasis within the bilateral upper lobes. Upper Abdomen: There is cholelithiasis. Otherwise, the upper abdomen is unremarkable. Musculoskeletal: No chest wall abnormality. No acute or significant osseous findings. Review of the MIP images confirms the above findings. IMPRESSION: 1. No PE identified, however evaluation was somewhat limited by respiratory motion artifact. The main pulmonary artery is borderline dilated which can be seen in patients with elevated PA pressures. 2. Cardiomegaly with reflux of contrast into the IVC consistent with underlying cardiac dysfunction. 3. Cholelithiasis. Aortic Atherosclerosis (ICD10-I70.0) and Emphysema (ICD10-J43.9). Electronically Signed   By: Katherine Mantlehristopher  Green M.D.   On: 01/04/2019 21:01   Disposition   Pt is being discharged home today in good condition.  Follow-up Plans & Appointments    Follow-up Information    Rollene RotundaHochrein,  James, MD Follow up on 01/14/2019.   Specialty:  Cardiology Why:  at 11:40am for your follow up appt. This will be a tele-health visit from your mobile phone.  Contact information: [address] [place] [address] [phone]          Discharge Instructions    AMB Referral to Cardiac Rehabilitation - Phase II  Complete by:  As directed    Diagnosis:  Stable Angina   After initial evaluation and assessments completed: Virtual Based Care may be provided alone or in conjunction with Phase 2 Cardiac Rehab based on patient barriers.:  Yes   Call MD for:  redness, tenderness, or signs of infection (pain, swelling, redness, odor or green/yellow discharge around incision site)   Complete by:  As directed    Discharge instructions   Complete by:  As directed    Groin Site Care Refer to this sheet in the next few weeks. These instructions provide you with information on caring for yourself after your procedure. Your caregiver may also give you more specific instructions. Your treatment has been planned according to current medical practices, but problems sometimes occur. Call your caregiver if you have any problems or questions after your procedure. HOME CARE INSTRUCTIONS You may shower 24 hours after the procedure. Remove the bandage (dressing) and gently wash the site with plain soap and water. Gently pat the site dry.  Do not apply powder or lotion to the site.  Do not sit in a bathtub, swimming pool, or whirlpool for 5 to 7 days.  No bending, squatting, or lifting anything over 10 pounds (4.5 kg) as directed by your caregiver.  Inspect the site at least twice daily.  Do not drive home if you are discharged the same day of the procedure. Have someone else drive you.  You may drive 24 hours after the procedure unless otherwise instructed by your caregiver.  What to expect: Any bruising will usually fade within 1 to 2 weeks.  Blood that collects in the tissue (hematoma) may  be painful to the touch. It should usually decrease in size and tenderness within 1 to 2 weeks.  SEEK IMMEDIATE MEDICAL CARE IF: You have unusual pain at the groin site or down the affected leg.  You have redness, warmth, swelling, or pain at the groin site.  You have drainage (other than a small amount of blood on the dressing).  You have chills.  You have a fever or persistent symptoms for more than 72 hours.  You have a fever and your symptoms suddenly get worse.  Your leg becomes pale, cool, tingly, or numb.  You have heavy bleeding from the site. Hold pressure on the site. .   Increase activity slowly   Complete by:  As directed      Discharge Medications     Medication List    TAKE these medications   acetaminophen 500 MG tablet Commonly known as:  TYLENOL Take 500 mg by mouth every 6 (six) hours as needed (for headaches.).   albuterol 108 (90 Base) MCG/ACT inhaler Commonly known as:  VENTOLIN HFA Inhale 1-2 puffs into the lungs every 6 (six) hours as needed for wheezing or shortness of breath.   aspirin 81 MG chewable tablet Chew 1 tablet (81 mg total) by mouth daily.   carvedilol 3.125 MG tablet Commonly known as:  COREG Take 1 tablet (3.125 mg total) by mouth 2 (two) times daily with a meal.   CINNAMON PO Take 1,000 mg by mouth daily.   clopidogrel 75 MG tablet Commonly known as:  PLAVIX Take 1 tablet (75 mg total) by mouth daily.   Co Q 10 100 MG Caps Take 100 mg by mouth daily.   Jardiance 10 MG Tabs tablet Generic drug:  empagliflozin Take 10 mg by mouth daily.   magnesium oxide 400 MG tablet Commonly known as:  MAG-OX Take 400 mg by mouth 2 (two) times daily.   metFORMIN 1000 MG tablet Commonly known as:  GLUCOPHAGE Take 1,000 mg by mouth 2 (two) times daily.   nitroGLYCERIN 0.4 MG SL tablet Commonly known as:  NITROSTAT Place 1 tablet (0.4 mg total) under the tongue every 5 (five) minutes as needed for chest pain.   pantoprazole 40 MG tablet  Commonly known as:  PROTONIX Take 1 tablet (40 mg total) by mouth every evening. What changed:  when to take this   pramipexole 1 MG tablet Commonly known as:  MIRAPEX Take 1 mg by mouth 2 (two) times a day.   sacubitril-valsartan 24-26 MG Commonly known as:  Entresto Take 1 tablet by mouth 2 (two) times daily.   spironolactone 25 MG tablet Commonly known as:  ALDACTONE Take 0.5 tablets (12.5 mg total) by mouth daily.   Toujeo SoloStar 300 UNIT/ML Sopn Generic drug:  Insulin Glargine (1 Unit Dial) Inject 20 Units into the skin daily.       Acute coronary syndrome (MI, NSTEMI, STEMI, etc) this admission?: No.     Outstanding Labs/Studies   N/a   Duration of Discharge Encounter   Greater than 30 minutes including physician time.  Signed, Laverda Page NP-C 01/06/2019, 1:36 PM

## 2019-01-06 NOTE — Interval H&P Note (Signed)
Cath Lab Visit (complete for each Cath Lab visit)  Clinical Evaluation Leading to the Procedure:   ACS: No.  Non-ACS:    Anginal Classification: CCS II  Anti-ischemic medical therapy: Minimal Therapy (1 class of medications)  Non-Invasive Test Results: No non-invasive testing performed  Prior CABG: No previous CABG      History and Physical Interval Note:  01/06/2019 7:47 AM  [name]  has presented today for surgery, with the diagnosis of cad - chf.  The various methods of treatment have been discussed with the patient and family. After consideration of risks, benefits and other options for treatment, the patient has consented to  Procedure(s): RIGHT/LEFT HEART CATH AND CORONARY ANGIOGRAPHY (N/A) as a surgical intervention.  The patient's history has been reviewed, patient examined, no change in status, stable for surgery.  I have reviewed the patient's chart and labs.  Questions were answered to the patient's satisfaction.     Nanetta Batty

## 2019-01-06 NOTE — Telephone Encounter (Signed)
New Message    Pt's wife is calling and she is not understanding the light that is on the machine, she has questions and would like for the nurse to return her call     Please call

## 2019-01-06 NOTE — Telephone Encounter (Signed)
Pt's wife called stated she needs to speak to dr Katrinka Blazing nurse before the takes pt home.  Stated she spoke to her this morning. Please give her a call back.

## 2019-01-06 NOTE — Discharge Instructions (Signed)
Heart-Healthy Eating Plan Heart-healthy meal planning includes:  Eating less unhealthy fats.  Eating more healthy fats.  Making other changes in your diet. Talk with your doctor or a diet specialist (dietitian) to create an eating plan that is right for you. What is my plan? Your doctor may recommend an eating plan that includes:  Total fat: ______% or less of total calories a day.  Saturated fat: ______% or less of total calories a day.  Cholesterol: less than _________mg a day. What are tips for following this plan? Cooking Avoid frying your food. Try to bake, boil, grill, or broil it instead. You can also reduce fat by:  Removing the skin from poultry.  Removing all visible fats from meats.  Steaming vegetables in water or broth. Meal planning   At meals, divide your plate into four equal parts: ? Fill one-half of your plate with vegetables and green salads. ? Fill one-fourth of your plate with whole grains. ? Fill one-fourth of your plate with lean protein foods.  Eat 4-5 servings of vegetables per day. A serving of vegetables is: ? 1 cup of raw or cooked vegetables. ? 2 cups of raw leafy greens.  Eat 4-5 servings of fruit per day. A serving of fruit is: ? 1 medium whole fruit. ?  cup of dried fruit. ?  cup of fresh, frozen, or canned fruit. ?  cup of 100% fruit juice.  Eat more foods that have soluble fiber. These are apples, broccoli, carrots, beans, peas, and barley. Try to get 20-30 g of fiber per day.  Eat 4-5 servings of nuts, legumes, and seeds per week: ? 1 serving of dried beans or legumes equals  cup after being cooked. ? 1 serving of nuts is  cup. ? 1 serving of seeds equals 1 tablespoon. General information  Eat more home-cooked food. Eat less restaurant, buffet, and fast food.  Limit or avoid alcohol.  Limit foods that are high in starch and sugar.  Avoid fried foods.  Lose weight if you are overweight.  Keep track of how much salt  (sodium) you eat. This is important if you have high blood pressure. Ask your doctor to tell you more about this.  Try to add vegetarian meals each week. Fats  Choose healthy fats. These include olive oil and canola oil, flaxseeds, walnuts, almonds, and seeds.  Eat more omega-3 fats. These include salmon, mackerel, sardines, tuna, flaxseed oil, and ground flaxseeds. Try to eat fish at least 2 times each week.  Check food labels. Avoid foods with trans fats or high amounts of saturated fat.  Limit saturated fats. ? These are often found in animal products, such as meats, butter, and cream. ? These are also found in plant foods, such as palm oil, palm kernel oil, and coconut oil.  Avoid foods with partially hydrogenated oils in them. These have trans fats. Examples are stick margarine, some tub margarines, cookies, crackers, and other baked goods. What foods can I eat? Fruits All fresh, canned (in natural juice), or frozen fruits. Vegetables Fresh or frozen vegetables (raw, steamed, roasted, or grilled). Green salads. Grains Most grains. Choose whole wheat and whole grains most of the time. Rice and pasta, including brown rice and pastas made with whole wheat. Meats and other proteins Lean, well-trimmed beef, veal, pork, and lamb. Chicken and [place] without skin. All fish and shellfish. Wild duck, rabbit, pheasant, and venison. Egg whites or low-cholesterol egg substitutes. Dried beans, peas, lentils, and tofu. Seeds and most  nuts. Dairy Low-fat or nonfat cheeses, including ricotta and mozzarella. Skim or 1% milk that is liquid, powdered, or evaporated. Buttermilk that is made with low-fat milk. Nonfat or low-fat yogurt. Fats and oils Non-hydrogenated (trans-free) margarines. Vegetable oils, including soybean, sesame, sunflower, olive, peanut, safflower, corn, canola, and cottonseed. Salad dressings or mayonnaise made with a vegetable oil. Beverages Mineral water. Coffee and tea. Diet  carbonated beverages. Sweets and desserts Sherbet, gelatin, and fruit ice. Small amounts of dark chocolate. Limit all sweets and desserts. Seasonings and condiments All seasonings and condiments. The items listed above may not be a complete list of foods and drinks you can eat. Contact a dietitian for more options. What foods should I avoid? Fruits Canned fruit in heavy syrup. Fruit in cream or butter sauce. Fried fruit. Limit coconut. Vegetables Vegetables cooked in cheese, cream, or butter sauce. Fried vegetables. Grains Breads that are made with saturated or trans fats, oils, or whole milk. Croissants. Sweet rolls. Donuts. High-fat crackers, such as cheese crackers. Meats and other proteins Fatty meats, such as hot dogs, ribs, sausage, bacon, rib-eye roast or steak. High-fat deli meats, such as salami and bologna. Caviar. Domestic duck and goose. Organ meats, such as liver. Dairy Cream, sour cream, cream cheese, and creamed cottage cheese. Whole-milk cheeses. Whole or 2% milk that is liquid, evaporated, or condensed. Whole buttermilk. Cream sauce or high-fat cheese sauce. Yogurt that is made from whole milk. Fats and oils Meat fat, or shortening. Cocoa butter, hydrogenated oils, palm oil, coconut oil, palm kernel oil. Solid fats and shortenings, including bacon fat, salt pork, lard, and butter. Nondairy cream substitutes. Salad dressings with cheese or sour cream. Beverages Regular sodas and juice drinks with added sugar. Sweets and desserts Frosting. Pudding. Cookies. Cakes. Pies. Milk chocolate or white chocolate. Buttered syrups. Full-fat ice cream or ice cream drinks. The items listed above may not be a complete list of foods and drinks to avoid. Contact a dietitian for more information. Summary  Heart-healthy meal planning includes eating less unhealthy fats, eating more healthy fats, and making other changes in your diet.  Eat a balanced diet. This includes fruits and  vegetables, low-fat or nonfat dairy, lean protein, nuts and legumes, whole grains, and heart-healthy oils and fats. This information is not intended to replace advice given to you by your health care provider. Make sure you discuss any questions you have with your health care provider. Document Released: 02/03/2012 Document Revised: 09/11/2017 Document Reviewed: 09/11/2017 Elsevier Interactive Patient Education  2019 [employer].   Diabetes Mellitus and Nutrition, Adult When you have diabetes (diabetes mellitus), it is very important to have healthy eating habits because your blood sugar (glucose) levels are greatly affected by what you eat and drink. Eating healthy foods in the appropriate amounts, at about the same times every day, can help you: Control your blood glucose. Lower your risk of heart disease. Improve your blood pressure. Reach or maintain a healthy weight. Every person with diabetes is different, and each person has different needs for a meal plan. Your health care provider may recommend that you work with a diet and nutrition specialist (dietitian) to make a meal plan that is best for you. Your meal plan may vary depending on factors such as: The calories you need. The medicines you take. Your weight. Your blood glucose, blood pressure, and cholesterol levels. Your activity level. Other health conditions you have, such as heart or kidney disease. How do carbohydrates affect me? Carbohydrates, also called carbs, affect  your blood glucose level more than any other type of food. Eating carbs naturally raises the amount of glucose in your blood. Carb counting is a method for keeping track of how many carbs you eat. Counting carbs is important to keep your blood glucose at a healthy level, especially if you use insulin or take certain oral diabetes medicines. It is important to know how many carbs you can safely have in each meal. This is different for every person. Your dietitian  can help you calculate how many carbs you should have at each meal and for each snack. Foods that contain carbs include: Bread, cereal, rice, pasta, and crackers. Potatoes and corn. Peas, beans, and lentils. Milk and yogurt. Fruit and juice. Desserts, such as cakes, cookies, ice cream, and candy. How does alcohol affect me? Alcohol can cause a sudden decrease in blood glucose (hypoglycemia), especially if you use insulin or take certain oral diabetes medicines. Hypoglycemia can be a life-threatening condition. Symptoms of hypoglycemia (sleepiness, dizziness, and confusion) are similar to symptoms of having too much alcohol. If your health care provider says that alcohol is safe for you, follow these guidelines: Limit alcohol intake to no more than 1 drink per day for nonpregnant women and 2 drinks per day for men. One drink equals 12 oz of beer, 5 oz of wine, or 1 oz of hard liquor. Do not drink on an empty stomach. Keep yourself hydrated with water, diet soda, or unsweetened iced tea. Keep in mind that regular soda, juice, and other mixers may contain a lot of sugar and must be counted as carbs. What are tips for following this plan?  Reading food labels Start by checking the serving size on the "Nutrition Facts" label of packaged foods and drinks. The amount of calories, carbs, fats, and other nutrients listed on the label is based on one serving of the item. Many items contain more than one serving per package. Check the total grams (g) of carbs in one serving. You can calculate the number of servings of carbs in one serving by dividing the total carbs by 15. For example, if a food has 30 g of total carbs, it would be equal to 2 servings of carbs. Check the number of grams (g) of saturated and trans fats in one serving. Choose foods that have low or no amount of these fats. Check the number of milligrams (mg) of salt (sodium) in one serving. Most people should limit total sodium intake to  less than 2,300 mg per day. Always check the nutrition information of foods labeled as "low-fat" or "nonfat". These foods may be higher in added sugar or refined carbs and should be avoided. Talk to your dietitian to identify your daily goals for nutrients listed on the label. Shopping Avoid buying canned, premade, or processed foods. These foods tend to be high in fat, sodium, and added sugar. Shop around the outside edge of the grocery store. This includes fresh fruits and vegetables, bulk grains, fresh meats, and fresh dairy. Cooking Use low-heat cooking methods, such as baking, instead of high-heat cooking methods like deep frying. Cook using healthy oils, such as olive, canola, or sunflower oil. Avoid cooking with butter, cream, or high-fat meats. Meal planning Eat meals and snacks regularly, preferably at the same times every day. Avoid going long periods of time without eating. Eat foods high in fiber, such as fresh fruits, vegetables, beans, and whole grains. Talk to your dietitian about how many servings of carbs you can  eat at each meal. Eat 4-6 ounces (oz) of lean protein each day, such as lean meat, chicken, fish, eggs, or tofu. One oz of lean protein is equal to: 1 oz of meat, chicken, or fish. 1 egg.  cup of tofu. Eat some foods each day that contain healthy fats, such as avocado, nuts, seeds, and fish. Lifestyle Check your blood glucose regularly. Exercise regularly as told by your health care provider. This may include: 150 minutes of moderate-intensity or vigorous-intensity exercise each week. This could be brisk walking, biking, or water aerobics. Stretching and doing strength exercises, such as yoga or weightlifting, at least 2 times a week. Take medicines as told by your health care provider. Do not use any products that contain nicotine or tobacco, such as cigarettes and e-cigarettes. If you need help quitting, ask your health care provider. Work with a Veterinary surgeon or  diabetes educator to identify strategies to manage stress and any emotional and social challenges. Questions to ask a health care provider Do I need to meet with a diabetes educator? Do I need to meet with a dietitian? What number can I call if I have questions? When are the best times to check my blood glucose? Where to find more information: American Diabetes Association: diabetes.org Academy of Nutrition and Dietetics: www.eatright.[employer] of Diabetes and Digestive and Kidney Diseases (NIH): CarFlippers.tn Summary A healthy meal plan will help you control your blood glucose and maintain a healthy lifestyle. Working with a diet and nutrition specialist (dietitian) can help you make a meal plan that is best for you. Keep in mind that carbohydrates (carbs) and alcohol have immediate effects on your blood glucose levels. It is important to count carbs and to use alcohol carefully. This information is not intended to replace advice given to you by your health care provider. Make sure you discuss any questions you have with your health care provider. Document Released: 05/01/2005 Document Revised: 03/04/2017 Document Reviewed: 09/08/2016 Elsevier Interactive Patient Education  2019 Elsevier Inc.     Femoral Site Care This sheet gives you information about how to care for yourself after your procedure. Your health care provider may also give you more specific instructions. If you have problems or questions, contact your health care provider. What can I expect after the procedure? After the procedure, it is common to have:  Bruising that usually fades within 1-2 weeks.  Tenderness at the site. Follow these instructions at home: Wound care  Follow instructions from your health care provider about how to take care of your insertion site. Make sure you: ? Wash your hands with soap and water before you change your bandage (dressing). If soap and water are not available, use  hand sanitizer. ? Change your dressing as told by your health care provider. ? Leave stitches (sutures), skin glue, or adhesive strips in place. These skin closures may need to stay in place for 2 weeks or longer. If adhesive strip edges start to loosen and curl up, you may trim the loose edges. Do not remove adhesive strips completely unless your health care provider tells you to do that.  Do not take baths, swim, or use a hot tub until your health care provider approves.  You may shower 24-48 hours after the procedure or as told by your health care provider. ? Gently wash the site with plain soap and water. ? Pat the area dry with a clean towel. ? Do not rub the site. This may cause bleeding.  Do  not apply powder or lotion to the site. Keep the site clean and dry.  Check your femoral site every day for signs of infection. Check for: ? Redness, swelling, or pain. ? Fluid or blood. ? Warmth. ? Pus or a bad smell. Activity  For the first 2-3 days after your procedure, or as long as directed: ? Avoid climbing stairs as much as possible. ? Do not squat.  Do not lift anything that is heavier than 10 lb (4.5 kg), or the limit that you are told, until your health care provider says that it is safe.  Rest as directed. ? Avoid sitting for a long time without moving. Get up to take short walks every 1-2 hours.  Do not drive for 24 hours if you were given a medicine to help you relax (sedative). General instructions  Take over-the-counter and prescription medicines only as told by your health care provider.  Keep all follow-up visits as told by your health care provider. This is important. Contact a health care provider if you have:  A fever or chills.  You have redness, swelling, or pain around your insertion site. Get help right away if:  The catheter insertion area swells very fast.  You pass out.  You suddenly start to sweat or your skin gets clammy.  The catheter insertion  area is bleeding, and the bleeding does not stop when you hold steady pressure on the area.  The area near or just beyond the catheter insertion site becomes pale, cool, tingly, or numb. These symptoms may represent a serious problem that is an emergency. Do not wait to see if the symptoms will go away. Get medical help right away. Call your local emergency services (911 in the [place] Do not drive yourself to the hospital. Summary  After the procedure, it is common to have bruising that usually fades within 1-2 weeks.  Check your femoral site every day for signs of infection.  Do not lift anything that is heavier than 10 lb (4.5 kg), or the limit that you are told, until your health care provider says that it is safe. This information is not intended to replace advice given to you by your health care provider. Make sure you discuss any questions you have with your health care provider. Document Released: 04/07/2014 Document Revised: 08/17/2017 Document Reviewed: 08/17/2017 Elsevier Interactive Patient Education  2019 [employer]

## 2019-01-06 NOTE — Progress Notes (Signed)
CARDIOLOGY NOTE   Cardiac cath completed earlier today demonstrating normal filling pressures, relatively low cardiac output, and no progression of coronary disease.  Therefore, has ischemic cardiomyopathy with chronic combined systolic and diastolic heart failure and is on appropriate medical therapy.  Frequent premature ventricular contractions.  Concerned that ventricular ectopy could be contributing to LV dysfunction.  A 48-hour Holter monitor will be helpful and if greater than 10% PVCs consider EP evaluation?  Ablation for amiodarone therapy to suppress.  Dyspnea and extreme fatigue of uncertain etiology.  Pulmonary embolism, decompensated left heart failure, have been excluded during this hospital stay.  Plan discharge today with follow-up by his primary cardiologist, Dr. Antoine Poche.  Consider advanced heart failure team evaluation but will leave this to Dr. Antoine Poche.

## 2019-01-06 NOTE — Telephone Encounter (Signed)
Forwarding to device clinic to discuss

## 2019-01-06 NOTE — Telephone Encounter (Signed)
Left message to call back  

## 2019-01-06 NOTE — Telephone Encounter (Signed)
I have not spoken with wife today.  This is a Camnitz/Hochrein pt.  Will route to triage.  Wife may have spoke to Laverda Page, NP.

## 2019-01-06 NOTE — Telephone Encounter (Signed)
I spoke with pt's wife. Pt is being discharged today from hospital and she is asking about his discharge medications.  I went over medications listed in discharge summary with her.

## 2019-01-07 MED FILL — Verapamil HCl IV Soln 2.5 MG/ML: INTRAVENOUS | Qty: 2 | Status: AC

## 2019-01-08 ENCOUNTER — Encounter (HOSPITAL_COMMUNITY): Payer: Self-pay | Admitting: Emergency Medicine

## 2019-01-08 ENCOUNTER — Inpatient Hospital Stay (HOSPITAL_COMMUNITY)
Admission: EM | Admit: 2019-01-08 | Discharge: 2019-01-10 | DRG: 309 | Disposition: A | Discharge status: Home / Self Care | Payer: PRIVATE HEALTH INSURANCE | Attending: Interventional Cardiology | Admitting: Interventional Cardiology

## 2019-01-08 ENCOUNTER — Other Ambulatory Visit: Payer: Self-pay

## 2019-01-08 ENCOUNTER — Emergency Department (HOSPITAL_COMMUNITY): Payer: PRIVATE HEALTH INSURANCE

## 2019-01-08 DIAGNOSIS — I252 Old myocardial infarction: Secondary | ICD-10-CM

## 2019-01-08 DIAGNOSIS — I472 Ventricular tachycardia, unspecified: Secondary | ICD-10-CM

## 2019-01-08 DIAGNOSIS — Z8249 Family history of ischemic heart disease and other diseases of the circulatory system: Secondary | ICD-10-CM

## 2019-01-08 DIAGNOSIS — I2510 Atherosclerotic heart disease of native coronary artery without angina pectoris: Secondary | ICD-10-CM

## 2019-01-08 DIAGNOSIS — E785 Hyperlipidemia, unspecified: Secondary | ICD-10-CM | POA: Diagnosis present

## 2019-01-08 DIAGNOSIS — I110 Hypertensive heart disease with heart failure: Secondary | ICD-10-CM | POA: Diagnosis present

## 2019-01-08 DIAGNOSIS — J42 Unspecified chronic bronchitis: Secondary | ICD-10-CM | POA: Diagnosis present

## 2019-01-08 DIAGNOSIS — G2581 Restless legs syndrome: Secondary | ICD-10-CM | POA: Diagnosis present

## 2019-01-08 DIAGNOSIS — Z9861 Coronary angioplasty status: Secondary | ICD-10-CM

## 2019-01-08 DIAGNOSIS — I10 Essential (primary) hypertension: Secondary | ICD-10-CM | POA: Diagnosis present

## 2019-01-08 DIAGNOSIS — G4733 Obstructive sleep apnea (adult) (pediatric): Secondary | ICD-10-CM | POA: Diagnosis present

## 2019-01-08 DIAGNOSIS — Z1159 Encounter for screening for other viral diseases: Secondary | ICD-10-CM

## 2019-01-08 DIAGNOSIS — Z807 Family history of other malignant neoplasms of lymphoid, hematopoietic and related tissues: Secondary | ICD-10-CM

## 2019-01-08 DIAGNOSIS — I5022 Chronic systolic (congestive) heart failure: Secondary | ICD-10-CM | POA: Diagnosis present

## 2019-01-08 DIAGNOSIS — E119 Type 2 diabetes mellitus without complications: Secondary | ICD-10-CM | POA: Diagnosis present

## 2019-01-08 DIAGNOSIS — Z955 Presence of coronary angioplasty implant and graft: Secondary | ICD-10-CM

## 2019-01-08 DIAGNOSIS — I255 Ischemic cardiomyopathy: Secondary | ICD-10-CM | POA: Diagnosis present

## 2019-01-08 LAB — CBC WITH DIFFERENTIAL/PLATELET
Abs Immature Granulocytes: 0.04 10*3/uL (ref 0.00–0.07)
Basophils Absolute: 0.1 10*3/uL (ref 0.0–0.1)
Basophils Relative: 1 %
Eosinophils Absolute: 0.3 10*3/uL (ref 0.0–0.5)
Eosinophils Relative: 2 %
HCT: 49 % (ref 39.0–52.0)
Hemoglobin: 16.6 g/dL (ref 13.0–17.0)
Immature Granulocytes: 0 %
Lymphocytes Relative: 24 %
Lymphs Abs: 3.2 10*3/uL (ref 0.7–4.0)
MCH: 31.9 pg (ref 26.0–34.0)
MCHC: 33.9 g/dL (ref 30.0–36.0)
MCV: 94 fL (ref 80.0–100.0)
Monocytes Absolute: 1.2 10*3/uL — ABNORMAL HIGH (ref 0.1–1.0)
Monocytes Relative: 9 %
Neutro Abs: 8.3 10*3/uL — ABNORMAL HIGH (ref 1.7–7.7)
Neutrophils Relative %: 64 %
Platelets: 126 10*3/uL — ABNORMAL LOW (ref 150–400)
RBC: 5.21 MIL/uL (ref 4.22–5.81)
RDW: 13.4 % (ref 11.5–15.5)
WBC: 13.1 10*3/uL — ABNORMAL HIGH (ref 4.0–10.5)
nRBC: 0 % (ref 0.0–0.2)

## 2019-01-08 LAB — COMPREHENSIVE METABOLIC PANEL
ALT: 36 U/L (ref 0–44)
AST: 38 U/L (ref 15–41)
Albumin: 4.1 g/dL (ref 3.5–5.0)
Alkaline Phosphatase: 49 U/L (ref 38–126)
Anion gap: 14 (ref 5–15)
BUN: 21 mg/dL (ref 8–23)
CO2: 19 mmol/L — ABNORMAL LOW (ref 22–32)
Calcium: 9.3 mg/dL (ref 8.9–10.3)
Chloride: 102 mmol/L (ref 98–111)
Creatinine, Ser: 1.2 mg/dL (ref 0.61–1.24)
GFR calc Af Amer: >60 mL/min (ref >60)
GFR calc non Af Amer: >60 mL/min (ref >60)
Glucose, Bld: 178 mg/dL — ABNORMAL HIGH (ref 70–99)
Potassium: 4.4 mmol/L (ref 3.5–5.1)
Sodium: 135 mmol/L (ref 135–145)
Total Bilirubin: 1.5 mg/dL — ABNORMAL HIGH (ref 0.3–1.2)
Total Protein: 7.1 g/dL (ref 6.5–8.1)

## 2019-01-08 LAB — TROPONIN I: Troponin I: <0.03 ng/mL (ref <0.03)

## 2019-01-08 LAB — MAGNESIUM: Magnesium: 2.3 mg/dL (ref 1.7–2.4)

## 2019-01-08 MED ORDER — DEXTROSE 5 % IV SOLN
30.0000 mg/h | INTRAVENOUS | Status: DC
  Filled 2019-01-08: qty 9

## 2019-01-08 MED ORDER — AMIODARONE LOAD VIA INFUSION
150.0000 mg | Freq: Once | INTRAVENOUS | Status: AC
  Administered 2019-01-08: 150 mg via INTRAVENOUS
  Filled 2019-01-08: qty 83.34

## 2019-01-08 MED ORDER — DEXTROSE 5 % IV SOLN
60.0000 mg/h | INTRAVENOUS | Status: AC
  Administered 2019-01-08: 60 mg/h via INTRAVENOUS
  Filled 2019-01-08 (×2): qty 9

## 2019-01-08 NOTE — ED Triage Notes (Signed)
Pt here for weakness, SOB, syncope while lying in bed per wife. Pt diaphoretic pale and weak. CBG 66, gave D10 on route, ASA 324 and albuterol neb. Pt had routine heart cath Thursday, no complications. EKG runs of Vtach and bigeminy couplets. AO x 4. Denies chest, denies cough, fever. 18 g Right FA, current CBG 222. 134/76, 70, 95% 2 L, 90% on room air.

## 2019-01-08 NOTE — Consult Note (Signed)
Cardiology Consult    Patient ID: [name] MRN: [id number], DOB/AGE: [birth year]   Admit date: 01/08/2019 Date of Consult: 01/08/2019  Primary Physician: Hal Morales, NP Primary Cardiologist: Rollene Rotunda, MD  Past Medical History   Past Medical History:  Diagnosis Date   ASCVD (arteriosclerotic cardiovascular disease)    a. MI 2004, did not seek care at that time. b. s/p PTCA/BMS to OM1 04/2011 (known totally occluded RCA with L-R collaterals and failed angioplasty on that vessel). c.)  Cath 2019 chronically occluded right coronary artery with collateralization.  90% circumflex stenosis treated with a DES.   Chronic bronchitis (HCC)    HTN (hypertension)    Hyperlipidemia    Ischemic cardiomyopathy    EF 30%.     OSA (obstructive sleep apnea) 05/24/11   Restless leg syndrome 06/25/2011   Type 2 diabetes mellitus [facility])     Past Surgical History:  Procedure Laterality Date   CARDIAC CATHETERIZATION  11/24/2012   40-50% ostial LAD, 40% mid LAD, 60-70% ostial D1, 40% proximal LCx, patent OM branch stent, 99% serial proximal-mid RCA lesions, 100% mid RCA occlusion (chronic with left to right collateralization); EF 40%, inferior wall hypokinesis.   CARDIAC CATHETERIZATION N/A 05/31/2015   Procedure: Left Heart Cath and Coronary Angiography;  Surgeon: Kathleene Hazel, MD;  Location: [facility] INVASIVE CV LAB;  Service: Cardiovascular;  Laterality: N/A;   CORONARY STENT INTERVENTION N/A 12/25/2017   Procedure: CORONARY STENT INTERVENTION;  Surgeon: Swaziland, Peter M, MD;  Location: [facility] INVASIVE CV LAB;  Service: Cardiovascular;  Laterality: N/A;   CORONARY STENT PLACEMENT  04/2011   CYSTECTOMY     Spine and jaw   ICD IMPLANT N/A 07/19/2018   Procedure: ICD IMPLANT;  Surgeon: Regan Lemming, MD;  Location: [facility] INVASIVE CV LAB;  Service: Cardiovascular;  Laterality: N/A;   LEFT HEART CATH AND CORONARY ANGIOGRAPHY N/A 12/25/2017   Procedure: LEFT HEART CATH AND  CORONARY ANGIOGRAPHY;  Surgeon: Swaziland, Peter M, MD;  Location: [facility] INVASIVE CV LAB;  Service: Cardiovascular;  Laterality: N/A;   LEFT HEART CATHETERIZATION WITH CORONARY ANGIOGRAM N/A 11/24/2012   Procedure: LEFT HEART CATHETERIZATION WITH CORONARY ANGIOGRAM;  Surgeon: Kathleene Hazel, MD;  Location: [facility] CATH LAB;  Service: Cardiovascular;  Laterality: N/A;   lesion removed from foot     right foot   RIGHT/LEFT HEART CATH AND CORONARY ANGIOGRAPHY N/A 01/06/2019   Procedure: RIGHT/LEFT HEART CATH AND CORONARY ANGIOGRAPHY;  Surgeon: Runell Gess, MD;  Location: [facility] INVASIVE CV LAB;  Service: Cardiovascular;  Laterality: N/A;   TONSILLECTOMY AND ADENOIDECTOMY       Allergies  Allergies  Allergen Reactions   Brilinta [Ticagrelor] Other (See Comments)    Intolerable dyspnea-changed to Plavix   Codeine Other (See Comments)    Lightheaded, vision issues   Lipitor [Atorvastatin Calcium] Other (See Comments)    Muscle pain   Niaspan [Niacin Er] Other (See Comments)    Head hurts    History of Present Illness    [name] is a 63 yo male with PMH of CAD s/p multiple stents, HTN, HL, OSA, DM and ICD MDT (12/19) who presented with dyspnea and PVCs. He is s/p prior OM1 PCI with DES in 2012. He was cathed in 2014 and 2016and was treated withmedical Rx. He presented to [facility] and was restudiedhis last cath this revealed a patent OM1 stent, CTO of the RCA with L-R collaterals from the CFX, 90% CFX (treated with PCI-DES)see below for picture,  40% pLAD and 90% small Dx1. His EF was 20-25% by echo.Meds were titrated and repeat Echo with EF did improved to 30%. He was sent to EP Dr. Elberta Fortis and had ICD placed in Dec.   A week ago he presented with DOE and chest sensations somewhat different from his historic CP. Had a LHC without a change in anatomy from last cath thus no PCI. Note to have PVC prior to d/c. Amio was contemplated.  At home today he noted palpitations  and chest burning different from his CP. No dizziness, no LEE. Denies ICD shock.   ON tele with multiple PVC and NSVT. 4 beats at the longest I have seen    Inpatient Medications     amiodarone  150 mg Intravenous Once    Family History    Family History  Problem Relation Age of Onset   Lymphoma Mother    Heart attack Father 74   Heart attack Brother 53   He indicated that his mother is deceased. He indicated that his father is deceased. He indicated that his brother is deceased. He indicated that his maternal grandmother is deceased. He indicated that his maternal grandfather is deceased. He indicated that his paternal grandmother is deceased. He indicated that his paternal grandfather is deceased.   Social History    Social History   Socioeconomic History   Marital status: Married    Spouse name: Not on file   Number of children: 1   Years of education: Not on file   Highest education level: Not on file  Occupational History   Occupation: Research officer, trade union strain: Not on file   Food insecurity:    Worry: Not on file    Inability: Not on file   Transportation needs:    Medical: Not on file    Non-medical: Not on file  Tobacco Use   Smoking status: Former Smoker    Packs/day: 1.50    Years: 35.00    Pack years: 52.50    Types: Cigarettes    Last attempt to quit: 08/18/2005    Years since quitting: 13.4   Smokeless tobacco: Never Used  Substance and Sexual Activity   Alcohol use: No   Drug use: No   Sexual activity: Not Currently  Lifestyle   Physical activity:    Days per week: Not on file    Minutes per session: Not on file   Stress: Not on file  Relationships   Social connections:    Talks on phone: Not on file    Gets together: Not on file    Attends religious service: Not on file    Active member of club or organization: Not on file    Attends meetings of clubs or organizations: Not on file     Relationship status: Not on file   Intimate partner violence:    Fear of current or ex partner: Not on file    Emotionally abused: Not on file    Physically abused: Not on file    Forced sexual activity: Not on file  Other Topics Concern   Not on file  Social History Narrative   Married with 1 grown daughter who is a Diplomatic Services operational officer for a school           Review of Systems    General:  No chills, fever, night sweats or weight changes.  Cardiovascular:  No chest pain, dyspnea on exertion, edema, orthopnea, palpitations, paroxysmal nocturnal  dyspnea. Dermatological: No rash, lesions/masses Respiratory: No cough, dyspnea Urologic: No hematuria, dysuria Abdominal:   No nausea, vomiting, diarrhea, bright red blood per rectum, melena, or hematemesis Neurologic:  No visual changes, wkns, changes in mental status. All other systems reviewed and are otherwise negative except as noted above.  Physical Exam    Blood pressure 132/86, pulse 70, resp. rate 16, SpO2 97 %.  General: Pleasant, NAD Psych: Normal affect. Neuro: Alert and oriented X 3. Moves all extremities spontaneously. HEENT: Normal  Neck: Supple without bruits or JVD. Lungs:  Resp regular and unlabored, CTA. Heart: RRR no s3, s4, or murmurs. Abdomen: Soft, non-tender, non-distended, BS + x 4.  Extremities: No clubbing, cyanosis or edema. DP/PT/Radials 2+ and equal bilaterally.  Labs    Troponin (Point of Care Test) No results for input(s): TROPIPOC in the last 72 hours. No results for input(s): CKTOTAL, CKMB, TROPONINI in the last 72 hours. Lab Results  Component Value Date   WBC 9.0 01/06/2019   HGB 17.0 01/06/2019   HCT 50.0 01/06/2019   MCV 93.4 01/06/2019   PLT 127 (L) 01/06/2019    Recent Labs  Lab 01/04/19 1711  01/06/19 0652  01/06/19 0840  NA  --    < > 139   < > 140  K  --    < > 4.2   < > 4.2  CL  --    < > 107  --   --   CO2  --    < > 20*  --   --   BUN  --    < > 16  --   --   CREATININE  --    <  > 1.04  --   --   CALCIUM  --    < > 9.6  --   --   PROT 7.0  --   --   --   --   BILITOT 0.7  --   --   --   --   ALKPHOS 44  --   --   --   --   ALT 46*  --   --   --   --   AST 29  --   --   --   --   GLUCOSE  --    < > 136*  --   --    < > = values in this interval not displayed.   Lab Results  Component Value Date   CHOL 188 01/05/2019   HDL 33 (L) 01/05/2019   LDLCALC 88 01/05/2019   TRIG 336 (H) 01/05/2019   Lab Results  Component Value Date   DDIMER 0.33 01/04/2019     Radiology Studies    Dg Chest 2 View  Result Date: 01/04/2019 CLINICAL DATA:  Chest pain and shortness of breath with fatigue. EXAM: CHEST - 2 VIEW COMPARISON:  07/20/2018. FINDINGS: Cardiomegaly. Single lead pacer good position. Chronic interstitial prominence, without superimposed consolidation or edema. No effusion or pneumothorax. IMPRESSION: 1. Cardiomegaly. No active disease. 2. Chronic interstitial prominence, without superimposed consolidation or edema. Stable appearance from priors. Electronically Signed   By: Elsie Stain M.D.   On: 01/04/2019 13:28   Ct Angio Chest Pe W Or Wo Contrast  Result Date: 01/04/2019 CLINICAL DATA:  Shortness of breath.  Concern for PE. EXAM: CT ANGIOGRAPHY CHEST WITH CONTRAST TECHNIQUE: Multidetector CT imaging of the chest was performed using the standard protocol during bolus administration of intravenous contrast. Multiplanar  CT image reconstructions and MIPs were obtained to evaluate the vascular anatomy. CONTRAST:  100mL OMNIPAQUE IOHEXOL 350 MG/ML SOLN COMPARISON:  Correlation made with chest x-ray from same day. FINDINGS: Cardiovascular: The main pulmonary artery is borderline dilated measuring up to 3 cm. The heart is enlarged. Coronary artery calcifications are noted. There is motion artifact that limits detection of segmental and subsegmental pulmonary emboli. There is reflux of contrast into the IVC consistent was some degree of underlying cardiac dysfunction.  Mediastinum/Nodes: No enlarged mediastinal, hilar, or axillary lymph nodes. Thyroid gland, trachea, and esophagus demonstrate no significant findings. Lungs/Pleura: There are upper lobe predominant emphysematous changes. There is bronchiectasis within the bilateral upper lobes. Upper Abdomen: There is cholelithiasis. Otherwise, the upper abdomen is unremarkable. Musculoskeletal: No chest wall abnormality. No acute or significant osseous findings. Review of the MIP images confirms the above findings. IMPRESSION: 1. No PE identified, however evaluation was somewhat limited by respiratory motion artifact. The main pulmonary artery is borderline dilated which can be seen in patients with elevated PA pressures. 2. Cardiomegaly with reflux of contrast into the IVC consistent with underlying cardiac dysfunction. 3. Cholelithiasis. Aortic Atherosclerosis (ICD10-I70.0) and Emphysema (ICD10-J43.9). Electronically Signed   By: Katherine Mantlehristopher  Green M.D.   On: 01/04/2019 21:01    ECG & Cardiac Imaging    NSR short runs of VT, frequent PVC  Assessment & Plan    63 y o man with ICM. ICD in place. No new  targets in coronary system. Now with increasing PVC/VT burden. He is very symptomatic with the burden of VT. No ICD shocks. TTE with LVEF of 20-25%  Plan: - Admit to cardiology -f/u pending labs (trop, CMP, CBC) - Start Amio iv load. If unsuccessful in next few days could consider EP study with ablation .   Signed, Macario GoldsMarat Cashton Hosley, MD 01/08/2019, 10:33 PM  For questions or updates, please contact   Please consult [email or web address] for contact info under Cardiology/STEMI.

## 2019-01-08 NOTE — ED Provider Notes (Addendum)
[facility] EMERGENCY DEPARTMENT Provider Note   CSN: [id number] Arrival date & time: 01/08/19  2127    History   Chief Complaint Chief Complaint  Patient presents with  . Loss of Consciousness  . Fatigue    HPI [name] is a 63 y.o. male.     HPI   63 yo M with extensive PMHx as below including severe ischemic CM, HTN, HLD, here with syncope. Pt was just hospitalized following evaluation for worsening DOE, found to have known severe CAD and profound CHF. Defib in place. He was noted to have ectopy at that time but no VT runs. He reports that earlier today he felt fine, came in from doing yard work and felt acute onset of burning epigastric pain, diaphoresis, and lightheadedness. He reportedly   Past Medical History:  Diagnosis Date  . ASCVD (arteriosclerotic cardiovascular disease)    a. MI 2004, did not seek care at that time. b. s/p PTCA/BMS to OM1 04/2011 (known totally occluded RCA with L-R collaterals and failed angioplasty on that vessel). c.)  Cath 2019 chronically occluded right coronary artery with collateralization.  90% circumflex stenosis treated with a DES.  Marland Kitchen Chronic bronchitis (HCC)   . HTN (hypertension)   . Hyperlipidemia   . Ischemic cardiomyopathy    EF 30%.    . OSA (obstructive sleep apnea) 05/24/11  . Restless leg syndrome 06/25/2011  . Type 2 diabetes mellitus [facility])     Patient Active Problem List   Diagnosis Date Noted  . Dyspnea on effort 01/04/2019  . Ischemic cardiomyopathy 07/19/2018  . Thrombocytopenia (HCC) 01/22/2018  . Dyspnea 01/21/2018  . Acute on chronic systolic CHF (congestive heart failure) (HCC) 12/25/2017  . Unstable angina (HCC) 12/24/2017  . Cardiomyopathy, ischemic   . Essential hypertension 11/25/2012  . Chronic bronchitis (HCC) 11/25/2012  . Non-insulin dependent type 2 diabetes mellitus (HCC) 11/25/2012  . Bradycardia 11/25/2012  . Restless leg syndrome 06/25/2011  . Dyslipidemia 06/05/2011  .  OSA (obstructive sleep apnea) 06/05/2011  . CAD S/P percutaneous coronary angioplasty 05/09/2011    Past Surgical History:  Procedure Laterality Date  . CARDIAC CATHETERIZATION  11/24/2012   40-50% ostial LAD, 40% mid LAD, 60-70% ostial D1, 40% proximal LCx, patent OM branch stent, 99% serial proximal-mid RCA lesions, 100% mid RCA occlusion (chronic with left to right collateralization); EF 40%, inferior wall hypokinesis.  Marland Kitchen CARDIAC CATHETERIZATION N/A 05/31/2015   Procedure: Left Heart Cath and Coronary Angiography;  Surgeon: Kathleene Hazel, MD;  Location: [facility] INVASIVE CV LAB;  Service: Cardiovascular;  Laterality: N/A;  . CORONARY STENT INTERVENTION N/A 12/25/2017   Procedure: CORONARY STENT INTERVENTION;  Surgeon: Swaziland, Peter M, MD;  Location: [facility] INVASIVE CV LAB;  Service: Cardiovascular;  Laterality: N/A;  . CORONARY STENT PLACEMENT  04/2011  . CYSTECTOMY     Spine and jaw  . ICD IMPLANT N/A 07/19/2018   Procedure: ICD IMPLANT;  Surgeon: Regan Lemming, MD;  Location: [facility] INVASIVE CV LAB;  Service: Cardiovascular;  Laterality: N/A;  . LEFT HEART CATH AND CORONARY ANGIOGRAPHY N/A 12/25/2017   Procedure: LEFT HEART CATH AND CORONARY ANGIOGRAPHY;  Surgeon: Swaziland, Peter M, MD;  Location: [facility] INVASIVE CV LAB;  Service: Cardiovascular;  Laterality: N/A;  . LEFT HEART CATHETERIZATION WITH CORONARY ANGIOGRAM N/A 11/24/2012   Procedure: LEFT HEART CATHETERIZATION WITH CORONARY ANGIOGRAM;  Surgeon: Kathleene Hazel, MD;  Location: [facility] CATH LAB;  Service: Cardiovascular;  Laterality: N/A;  . lesion removed from foot  right foot  . RIGHT/LEFT HEART CATH AND CORONARY ANGIOGRAPHY N/A 01/06/2019   Procedure: RIGHT/LEFT HEART CATH AND CORONARY ANGIOGRAPHY;  Surgeon: Runell Gess, MD;  Location: [facility] INVASIVE CV LAB;  Service: Cardiovascular;  Laterality: N/A;  . TONSILLECTOMY AND ADENOIDECTOMY          Home Medications    Prior to Admission medications   Medication Sig Start  Date End Date Taking? Authorizing Provider  acetaminophen (TYLENOL) 500 MG tablet Take 500 mg by mouth every 6 (six) hours as needed (for headaches.).    [provider]  albuterol (PROVENTIL HFA;VENTOLIN HFA) 108 (90 Base) MCG/ACT inhaler Inhale 1-2 puffs into the lungs every 6 (six) hours as needed for wheezing or shortness of breath.    [provider]  aspirin 81 MG chewable tablet Chew 1 tablet (81 mg total) by mouth daily. 01/06/19   Arty Baumgartner, NP  carvedilol (COREG) 3.125 MG tablet Take 1 tablet (3.125 mg total) by mouth 2 (two) times daily with a meal. 09/14/18   Camnitz, Andree Coss, MD  CINNAMON PO Take 1,000 mg by mouth daily.    [provider]  clopidogrel (PLAVIX) 75 MG tablet Take 1 tablet (75 mg total) by mouth daily. 09/14/18   Camnitz, Will Daphine Deutscher, MD  Coenzyme Q10 (CO Q 10) 100 MG CAPS Take 100 mg by mouth daily.    [provider]  JARDIANCE 10 MG TABS tablet Take 10 mg by mouth daily. 10/05/17   [provider]  magnesium oxide (MAG-OX) 400 MG tablet Take 400 mg by mouth 2 (two) times daily.    [provider]  metFORMIN (GLUCOPHAGE) 1000 MG tablet Take 1,000 mg by mouth 2 (two) times daily. 10/05/17   [provider]  nitroGLYCERIN (NITROSTAT) 0.4 MG SL tablet Place 1 tablet (0.4 mg total) under the tongue every 5 (five) minutes as needed for chest pain. 04/20/15   Rollene Rotunda, MD  pantoprazole (PROTONIX) 40 MG tablet Take 1 tablet (40 mg total) by mouth every evening. Patient taking differently: Take 40 mg by mouth daily.  03/29/13   Rollene Rotunda, MD  pramipexole (MIRAPEX) 1 MG tablet Take 1 mg by mouth 2 (two) times a day.     [provider]  sacubitril-valsartan (ENTRESTO) 24-26 MG Take 1 tablet by mouth 2 (two) times daily. 09/14/18   Camnitz, Andree Coss, MD  spironolactone (ALDACTONE) 25 MG tablet Take 0.5 tablets (12.5 mg total) by mouth daily. 09/14/18   Camnitz, Will Daphine Deutscher, MD  TOUJEO  SOLOSTAR 300 UNIT/ML SOPN Inject 20 Units into the skin daily. 10/14/18   [provider]    Family History Family History  Problem Relation Age of Onset  . Lymphoma Mother   . Heart attack Father 53  . Heart attack Brother 69    Social History Social History   Tobacco Use  . Smoking status: Former Smoker    Packs/day: 1.50    Years: 35.00    Pack years: 52.50    Types: Cigarettes    Last attempt to quit: 08/18/2005    Years since quitting: 13.4  . Smokeless tobacco: Never Used  Substance Use Topics  . Alcohol use: No  . Drug use: No     Allergies   Brilinta [ticagrelor]; Codeine; Lipitor [atorvastatin calcium]; and Niaspan [niacin er]   Review of Systems Review of Systems  Constitutional: Negative for chills, fatigue and fever.  HENT: Negative for congestion and rhinorrhea.   Eyes: Negative for visual  disturbance.  Respiratory: Positive for shortness of breath. Negative for cough and wheezing.   Cardiovascular: Negative for chest pain and leg swelling.  Gastrointestinal: Negative for abdominal pain, diarrhea, nausea and vomiting.  Genitourinary: Negative for dysuria and flank pain.  Musculoskeletal: Negative for neck pain and neck stiffness.  Skin: Negative for rash and wound.  Allergic/Immunologic: Negative for immunocompromised state.  Neurological: Positive for syncope and light-headedness. Negative for weakness and headaches.  All other systems reviewed and are negative.    Physical Exam Updated Vital Signs BP 120/74   Pulse (!) 30   Temp 98 F (36.7 C) (Oral)   Resp 18   SpO2 95%   Physical Exam Vitals signs and nursing note reviewed.  Constitutional:      General: He is not in acute distress.    Appearance: He is well-developed.     Comments: Chronically ill appearing  HENT:     Head: Normocephalic and atraumatic.  Eyes:     Conjunctiva/sclera: Conjunctivae normal.  Neck:     Musculoskeletal: Neck supple.  Cardiovascular:     Rate  and Rhythm: Normal rate. Rhythm irregular.  Extrasystoles are present.    Heart sounds: Normal heart sounds. No murmur. No friction rub.  Pulmonary:     Effort: Pulmonary effort is normal. No respiratory distress.     Breath sounds: Examination of the right-lower field reveals rales. Examination of the left-lower field reveals rales. Rales present. No wheezing.  Abdominal:     General: There is no distension.     Palpations: Abdomen is soft.     Tenderness: There is no abdominal tenderness.  Musculoskeletal:     Right lower leg: Edema present.     Left lower leg: Edema present.  Skin:    General: Skin is warm.     Capillary Refill: Capillary refill takes less than 2 seconds.  Neurological:     Mental Status: He is alert and oriented to person, place, and time.     Motor: No abnormal muscle tone.      ED Treatments / Results  Labs (all labs ordered are listed, but only abnormal results are displayed) Labs Reviewed  CBC WITH DIFFERENTIAL/PLATELET - Abnormal; Notable for the following components:      Result Value   WBC 13.1 (*)    Platelets 126 (*)    Neutro Abs 8.3 (*)    Monocytes Absolute 1.2 (*)    All other components within normal limits  COMPREHENSIVE METABOLIC PANEL - Abnormal; Notable for the following components:   CO2 19 (*)    Glucose, Bld 178 (*)    Total Bilirubin 1.5 (*)    All other components within normal limits  SARS CORONAVIRUS 2 (HOSPITAL ORDER, PERFORMED IN [facility])  MAGNESIUM  TROPONIN I  TROPONIN I    EKG EKG Interpretation  Date/Time:  Saturday Jan 08 2019 21:33:05 EDT Ventricular Rate:  74 PR Interval:    QRS Duration: 130 QT Interval:  429 QTC Calculation: 476 R Axis:   -18 Text Interpretation:  Sinus rhythm Paired ventricular premature complexes Borderline prolonged PR interval Probable left atrial enlargement Nonspecific intraventricular conduction delay Inferior infarct, age indeterminate No significant change since  last tracing Confirmed by Shaune Pollack [phone]) on 01/08/2019 10:25:19 PM   Radiology Dg Chest Portable 1 View  Result Date: 01/08/2019 CLINICAL DATA:  63 y/o  M; shortness of breath. Frequent PVCs. EXAM: PORTABLE CHEST 1 VIEW COMPARISON:  01/04/2019 CT chest and chest radiograph.  FINDINGS: Stable cardiomegaly and single lead AICD. Coarse interstitial opacities. Superimposed patchy opacities in the mid and lower lung zones. No pleural effusion or pneumothorax. Transcutaneous pacing pads noted. Bones are unremarkable. IMPRESSION: Stable cardiomegaly. Interval development of patchy mid and lower lung zone opacities which may edema or pulmonary infiltrate. Electronically Signed   By: Mitzi Hansen M.D.   On: 01/08/2019 23:22    Procedures .Critical Care Performed by: Shaune Pollack, MD Authorized by: Shaune Pollack, MD   Critical care provider statement:    Critical care time (minutes):  35   Critical care time was exclusive of:  Separately billable procedures and treating other patients and teaching time   Critical care was necessary to treat or prevent imminent or life-threatening deterioration of the following conditions:  Cardiac failure and circulatory failure   Critical care was time spent personally by me on the following activities:  Development of treatment plan with patient or surrogate, discussions with consultants, evaluation of patient's response to treatment, examination of patient, obtaining history from patient or surrogate, ordering and performing treatments and interventions, ordering and review of laboratory studies, ordering and review of radiographic studies, pulse oximetry, re-evaluation of patient's condition and review of old charts   I assumed direction of critical care for this patient from another provider in my specialty: no     (including critical care time)  Medications Ordered in ED Medications  amiodarone (NEXTERONE) 1.8 mg/mL load via infusion 150  mg (150 mg Intravenous Bolus from Bag 01/08/19 2243)    Followed by  amiodarone (CORDARONE) 450 mg in dextrose 5 % 250 mL infusion (60 mg/hr Intravenous Incomplete 01/08/19 2244)    Followed by  amiodarone (CORDARONE) 450 mg in dextrose 5 % 250 mL infusion (has no administration in time range)     Initial Impression / Assessment and Plan / ED Course  I have reviewed the triage vital signs and the nursing notes.  Pertinent labs & imaging results that were available during my care of the patient were reviewed by me and considered in my medical decision making (see chart for details).        63 yo M with severe CAD, CHF here w/ syncope. Tele shows NSVT runs. Lytes largely wnl. Not anemic. CXR at baseline. Pt just had LHC/RHC and follows with cardiology. Discussed with Cards fellow on call. Will start on amio bolus and drip, admit to Cardiology. Pt in agreement. He feels better now, no CP, EKG without overt ischemia.  Final Clinical Impressions(s) / ED Diagnoses   Final diagnoses:  Ventricular tachycardia [facility])    ED Discharge Orders    None       Shaune Pollack, MD 01/08/19 2329    Shaune Pollack, MD 01/16/19 0981    Shaune Pollack, MD 01/16/19 [phone]

## 2019-01-08 NOTE — ED Notes (Signed)
Spoke to [place], pt wife on phone and gave update.

## 2019-01-08 NOTE — ED Notes (Signed)
ED TO INPATIENT HANDOFF REPORT  ED Nurse Name and Phone #: [phone] [facility] [address]   [name] 63 y.o. male Room/Bed: RESUSC/RESUSC  Code Status fULL CODE  Home/SNF/Other DC HOME AO.X4   Triage Complete: Triage complete  Chief Complaint [facility]  Triage Note Pt here for weakness, SOB, syncope while lying in bed per wife. Pt diaphoretic pale and weak. CBG 66, gave D10 on route, ASA 324 and albuterol neb. Pt had routine heart cath Thursday, no complications. EKG runs of Vtach and bigeminy couplets. AO x 4. Denies chest, denies cough, fever. 18 g Right FA, current CBG 222. 134/76, 70, 95% 2 L, 90% on room air.    Allergies Allergies  Allergen Reactions  . Brilinta [Ticagrelor] Other (See Comments)    Intolerable dyspnea-changed to Plavix  . Codeine Other (See Comments)    Lightheaded, vision issues  . Lipitor [Atorvastatin Calcium] Other (See Comments)    Muscle pain  . Niaspan [Niacin Er] Other (See Comments)    Head hurts    Level of Care/Admitting Diagnosis ED Disposition    None      B Medical/Surgery History Past Medical History:  Diagnosis Date  . ASCVD (arteriosclerotic cardiovascular disease)    a. MI 2004, did not seek care at that time. b. s/p PTCA/BMS to OM1 04/2011 (known totally occluded RCA with L-R collaterals and failed angioplasty on that vessel). c.)  Cath 2019 chronically occluded right coronary artery with collateralization.  90% circumflex stenosis treated with a DES.  Marland Kitchen Chronic bronchitis (HCC)   . HTN (hypertension)   . Hyperlipidemia   . Ischemic cardiomyopathy    EF 30%.    . OSA (obstructive sleep apnea) 05/24/11  . Restless leg syndrome 06/25/2011  . Type 2 diabetes mellitus (HCC)    Past Surgical History:  Procedure Laterality Date  . CARDIAC CATHETERIZATION  11/24/2012   40-50% ostial LAD, 40% mid LAD, 60-70% ostial D1, 40% proximal LCx, patent OM branch stent, 99% serial proximal-mid RCA lesions, 100% mid RCA  occlusion (chronic with left to right collateralization); EF 40%, inferior wall hypokinesis.  Marland Kitchen CARDIAC CATHETERIZATION N/A 05/31/2015   Procedure: Left Heart Cath and Coronary Angiography;  Surgeon: Kathleene Hazel, MD;  Location: [facility] INVASIVE CV LAB;  Service: Cardiovascular;  Laterality: N/A;  . CORONARY STENT INTERVENTION N/A 12/25/2017   Procedure: CORONARY STENT INTERVENTION;  Surgeon: Swaziland, Peter M, MD;  Location: [facility] INVASIVE CV LAB;  Service: Cardiovascular;  Laterality: N/A;  . CORONARY STENT PLACEMENT  04/2011  . CYSTECTOMY     Spine and jaw  . ICD IMPLANT N/A 07/19/2018   Procedure: ICD IMPLANT;  Surgeon: Regan Lemming, MD;  Location: [facility] INVASIVE CV LAB;  Service: Cardiovascular;  Laterality: N/A;  . LEFT HEART CATH AND CORONARY ANGIOGRAPHY N/A 12/25/2017   Procedure: LEFT HEART CATH AND CORONARY ANGIOGRAPHY;  Surgeon: Swaziland, Peter M, MD;  Location: [facility] INVASIVE CV LAB;  Service: Cardiovascular;  Laterality: N/A;  . LEFT HEART CATHETERIZATION WITH CORONARY ANGIOGRAM N/A 11/24/2012   Procedure: LEFT HEART CATHETERIZATION WITH CORONARY ANGIOGRAM;  Surgeon: Kathleene Hazel, MD;  Location: [facility] CATH LAB;  Service: Cardiovascular;  Laterality: N/A;  . lesion removed from foot     right foot  . RIGHT/LEFT HEART CATH AND CORONARY ANGIOGRAPHY N/A 01/06/2019   Procedure: RIGHT/LEFT HEART CATH AND CORONARY ANGIOGRAPHY;  Surgeon: Runell Gess, MD;  Location: [facility] INVASIVE CV LAB;  Service: Cardiovascular;  Laterality: N/A;  . TONSILLECTOMY AND ADENOIDECTOMY  A IV Location/Drains/Wounds Patient Lines/Drains/Airways Status   Active Line/Drains/Airways    Name:   Placement date:   Placement time:   Site:   Days:   Peripheral IV 01/06/19 Left;Posterior Forearm   01/06/19    0556    Forearm   2   Incision (Closed) 07/19/18 Chest Left;Upper   07/19/18    1252     173          Intake/Output Last 24 hours No intake or output data in the 24 hours ending 01/08/19  2226  Labs/Imaging No results found for this or any previous visit (from the past 48 hour(s)). No results found.  Pending Labs Unresulted Labs (From admission, onward)    Start     Ordered   01/08/19 2142  Comprehensive metabolic panel  ONCE - STAT,   STAT     01/08/19 2141   01/08/19 2142  Magnesium  ONCE - STAT,   STAT     01/08/19 2141   01/08/19 2142  Troponin I - ONCE - STAT  ONCE - STAT,   STAT     01/08/19 2141   01/08/19 2141  CBC with Differential  ONCE - STAT,   STAT     01/08/19 2141          Vitals/Pain Today's Vitals   01/08/19 2132 01/08/19 2133 01/08/19 2138  BP: 132/86    Pulse:  70   Resp:  16   SpO2:  97%   PainSc:   0-No pain    Isolation Precautions No active isolations  Medications Medications  amiodarone (NEXTERONE) 1.8 mg/mL load via infusion 150 mg (has no administration in time range)    Followed by  amiodarone (CORDARONE) 450 mg in dextrose 5 % 250 mL infusion (has no administration in time range)    Followed by  amiodarone (CORDARONE) 450 mg in dextrose 5 % 250 mL infusion (has no administration in time range)    Mobility SELF CARE Low fall risk   Focused Assessments Pt AO x 4, having bigeminy PVC, no skin problems   R Recommendations: See Admitting Provider Note  Report given to:   Additional Notes:

## 2019-01-09 DIAGNOSIS — Z1159 Encounter for screening for other viral diseases: Secondary | ICD-10-CM | POA: Diagnosis not present

## 2019-01-09 DIAGNOSIS — I5022 Chronic systolic (congestive) heart failure: Secondary | ICD-10-CM | POA: Diagnosis present

## 2019-01-09 DIAGNOSIS — Z8249 Family history of ischemic heart disease and other diseases of the circulatory system: Secondary | ICD-10-CM | POA: Diagnosis not present

## 2019-01-09 DIAGNOSIS — Z807 Family history of other malignant neoplasms of lymphoid, hematopoietic and related tissues: Secondary | ICD-10-CM | POA: Diagnosis not present

## 2019-01-09 DIAGNOSIS — Z955 Presence of coronary angioplasty implant and graft: Secondary | ICD-10-CM | POA: Diagnosis not present

## 2019-01-09 DIAGNOSIS — I472 Ventricular tachycardia, unspecified: Secondary | ICD-10-CM

## 2019-01-09 DIAGNOSIS — E785 Hyperlipidemia, unspecified: Secondary | ICD-10-CM | POA: Diagnosis present

## 2019-01-09 DIAGNOSIS — I255 Ischemic cardiomyopathy: Secondary | ICD-10-CM | POA: Diagnosis present

## 2019-01-09 DIAGNOSIS — E119 Type 2 diabetes mellitus without complications: Secondary | ICD-10-CM | POA: Diagnosis present

## 2019-01-09 DIAGNOSIS — I252 Old myocardial infarction: Secondary | ICD-10-CM | POA: Diagnosis not present

## 2019-01-09 DIAGNOSIS — G2581 Restless legs syndrome: Secondary | ICD-10-CM | POA: Diagnosis present

## 2019-01-09 DIAGNOSIS — I2510 Atherosclerotic heart disease of native coronary artery without angina pectoris: Secondary | ICD-10-CM | POA: Diagnosis present

## 2019-01-09 DIAGNOSIS — I110 Hypertensive heart disease with heart failure: Secondary | ICD-10-CM | POA: Diagnosis present

## 2019-01-09 DIAGNOSIS — J42 Unspecified chronic bronchitis: Secondary | ICD-10-CM | POA: Diagnosis present

## 2019-01-09 DIAGNOSIS — G4733 Obstructive sleep apnea (adult) (pediatric): Secondary | ICD-10-CM | POA: Diagnosis present

## 2019-01-09 LAB — TROPONIN I: Troponin I: <0.03 ng/mL (ref <0.03)

## 2019-01-09 LAB — SARS CORONAVIRUS 2 BY RT PCR (HOSPITAL ORDER, PERFORMED IN ~~LOC~~ HOSPITAL LAB): SARS Coronavirus 2: NEGATIVE

## 2019-01-09 MED ORDER — NITROGLYCERIN 0.4 MG SL SUBL: 0.4000 mg | SUBLINGUAL_TABLET | SUBLINGUAL | Status: DC | PRN

## 2019-01-09 MED ORDER — ASPIRIN 81 MG PO CHEW: 324.0000 mg | CHEWABLE_TABLET | ORAL | Status: DC

## 2019-01-09 MED ORDER — ACETAMINOPHEN 325 MG PO TABS
650.0000 mg | ORAL_TABLET | ORAL | Status: DC | PRN
  Administered 2019-01-09: 650 mg via ORAL
  Filled 2019-01-09: qty 2

## 2019-01-09 MED ORDER — CLOPIDOGREL BISULFATE 75 MG PO TABS
75.0000 mg | ORAL_TABLET | Freq: Every day | ORAL | Status: DC
  Administered 2019-01-09 – 2019-01-10 (×2): 75 mg via ORAL
  Filled 2019-01-09 (×2): qty 1

## 2019-01-09 MED ORDER — SPIRONOLACTONE 12.5 MG HALF TABLET
12.5000 mg | ORAL_TABLET | Freq: Every day | ORAL | Status: DC
  Administered 2019-01-09 – 2019-01-10 (×2): 12.5 mg via ORAL
  Filled 2019-01-09 (×2): qty 1

## 2019-01-09 MED ORDER — MAGNESIUM OXIDE 400 (241.3 MG) MG PO TABS
400.0000 mg | ORAL_TABLET | Freq: Two times a day (BID) | ORAL | Status: DC
  Administered 2019-01-09 – 2019-01-10 (×3): 400 mg via ORAL
  Filled 2019-01-09 (×3): qty 1

## 2019-01-09 MED ORDER — INSULIN GLARGINE 100 UNIT/ML ~~LOC~~ SOLN
20.0000 [IU] | Freq: Every day | SUBCUTANEOUS | Status: DC
  Administered 2019-01-09 – 2019-01-10 (×2): 20 [IU] via SUBCUTANEOUS
  Filled 2019-01-09 (×2): qty 0.2

## 2019-01-09 MED ORDER — ONDANSETRON HCL 4 MG/2ML IJ SOLN: 4.0000 mg | Freq: Four times a day (QID) | INTRAMUSCULAR | Status: DC | PRN

## 2019-01-09 MED ORDER — ASPIRIN 300 MG RE SUPP: 300.0000 mg | RECTAL | Status: AC

## 2019-01-09 MED ORDER — INSULIN GLARGINE (1 UNIT DIAL) 300 UNIT/ML ~~LOC~~ SOPN: 20.0000 [IU] | PEN_INJECTOR | Freq: Every day | SUBCUTANEOUS | Status: DC

## 2019-01-09 MED ORDER — CARVEDILOL 3.125 MG PO TABS
3.1250 mg | ORAL_TABLET | Freq: Two times a day (BID) | ORAL | Status: DC
  Administered 2019-01-09 – 2019-01-10 (×3): 3.125 mg via ORAL
  Filled 2019-01-09 (×3): qty 1

## 2019-01-09 MED ORDER — PANTOPRAZOLE SODIUM 40 MG PO TBEC
40.0000 mg | DELAYED_RELEASE_TABLET | Freq: Every evening | ORAL | Status: DC
  Administered 2019-01-09: 40 mg via ORAL
  Filled 2019-01-09: qty 1

## 2019-01-09 MED ORDER — PRAMIPEXOLE DIHYDROCHLORIDE 1 MG PO TABS
1.0000 mg | ORAL_TABLET | Freq: Three times a day (TID) | ORAL | Status: DC
  Administered 2019-01-09 – 2019-01-10 (×5): 1 mg via ORAL
  Filled 2019-01-09 (×6): qty 1

## 2019-01-09 MED ORDER — ASPIRIN 81 MG PO CHEW: 81.0000 mg | CHEWABLE_TABLET | Freq: Every day | ORAL | Status: DC

## 2019-01-09 MED ORDER — ASPIRIN 81 MG PO CHEW: 324.0000 mg | CHEWABLE_TABLET | ORAL | Status: AC

## 2019-01-09 MED ORDER — SACUBITRIL-VALSARTAN 24-26 MG PO TABS
1.0000 | ORAL_TABLET | Freq: Two times a day (BID) | ORAL | Status: DC
  Administered 2019-01-09 – 2019-01-10 (×3): 1 via ORAL
  Filled 2019-01-09 (×3): qty 1

## 2019-01-09 MED ORDER — ASPIRIN EC 81 MG PO TBEC: 81.0000 mg | DELAYED_RELEASE_TABLET | Freq: Every day | ORAL | Status: DC

## 2019-01-09 MED ORDER — AMIODARONE HCL 200 MG PO TABS
400.0000 mg | ORAL_TABLET | Freq: Two times a day (BID) | ORAL | Status: DC
  Administered 2019-01-09 – 2019-01-10 (×2): 400 mg via ORAL
  Filled 2019-01-09 (×2): qty 2

## 2019-01-09 MED ORDER — ASPIRIN EC 81 MG PO TBEC
81.0000 mg | DELAYED_RELEASE_TABLET | Freq: Every day | ORAL | Status: DC
  Administered 2019-01-10: 81 mg via ORAL
  Filled 2019-01-09: qty 1

## 2019-01-09 MED ORDER — ACETAMINOPHEN 325 MG PO TABS: 650.0000 mg | ORAL_TABLET | ORAL | Status: DC | PRN

## 2019-01-09 MED ORDER — ASPIRIN 300 MG RE SUPP: 300.0000 mg | RECTAL | Status: DC

## 2019-01-09 NOTE — Progress Notes (Addendum)
Progress Note  Patient Name: [name] Date of Encounter: 01/09/2019  Primary Cardiologist:   Rollene Rotunda, MD   Subjective   Currently feels OK while in ventricular bigeminy.  No runs of NSVT more than 3 beats overnight.    Inpatient Medications    Scheduled Meds:  aspirin  324 mg Oral NOW   Or   aspirin  300 mg Rectal NOW   [START ON 01/10/2019] aspirin EC  81 mg Oral Daily   carvedilol  3.125 mg Oral BID WC   clopidogrel  75 mg Oral Daily   insulin glargine  20 Units Subcutaneous Daily   magnesium oxide  400 mg Oral BID   pantoprazole  40 mg Oral QPM   pramipexole  1 mg Oral TID   sacubitril-valsartan  1 tablet Oral BID   spironolactone  12.5 mg Oral Daily   Continuous Infusions:  amiodarone (CORDARONE) infusion 30 mg/hr (01/09/19 0448)   PRN Meds: acetaminophen, nitroGLYCERIN, ondansetron (ZOFRAN) IV   Vital Signs    Vitals:   01/09/19 0245 01/09/19 0300 01/09/19 0650 01/09/19 0953  BP: (!) 131/91  116/83 135/81  Pulse: 65  61 77  Resp: 18  20   Temp: (!) 97.4 F (36.3 C)  97.6 F (36.4 C)   TempSrc: Oral  Oral   SpO2: 98%  97%   Weight:  94.2 kg    Height:   (1.778 m)      Intake/Output Summary (Last 24 hours) at 01/09/2019 1116 Last data filed at 01/09/2019 1000 Gross per 24 hour  Intake 952.91 ml  Output --  Net 952.91 ml   Filed Weights   01/09/19 0300  Weight: 94.2 kg    Telemetry    NSR, PVCs, triplets, couplets - Personally Reviewed  ECG    NA - Personally Reviewed  Physical Exam   GEN: No acute distress.   Neck: No  JVD Cardiac: RRR, no murmurs, rubs, or gallops.  Respiratory: Clear  to auscultation bilaterally. GI: Soft, nontender, non-distended  MS: No  edema; No deformity. Neuro:  Nonfocal  Psych: Normal affect   Labs    Chemistry Recent Labs  Lab 01/04/19 1711 01/05/19 0403 01/06/19 8119  01/06/19 0819 01/06/19 0840 01/08/19 2153  NA  --  139 139   < > 142 140 135  K  --  4.2 4.2   <  > 4.1 4.2 4.4  CL  --  106 107  --   --   --  102  CO2  --  20* 20*  --   --   --  19*  GLUCOSE  --  109* 136*  --   --   --  178*  BUN  --  17 16  --   --   --  21  CREATININE  --  0.91 1.04  --   --   --  1.20  CALCIUM  --  9.0 9.6  --   --   --  9.3  PROT 7.0  --   --   --   --   --  7.1  ALBUMIN 3.8  --   --   --   --   --  4.1  AST 29  --   --   --   --   --  38  ALT 46*  --   --   --   --   --  36  ALKPHOS 44  --   --   --   --   --  49  BILITOT 0.7  --   --   --   --   --  1.5*  GFRNONAA  --  >60 >60  --   --   --  >60  GFRAA  --  >60 >60  --   --   --  >60  ANIONGAP  --  13 12  --   --   --  14   < > = values in this interval not displayed.     Hematology Recent Labs  Lab 01/05/19 0403 01/06/19 0346  01/06/19 0819 01/06/19 0840 01/08/19 2153  WBC 8.5 9.0  --   --   --  13.1*  RBC 5.17 5.42  --   --   --  5.21  HGB 16.5 17.5*   < > 17.3* 17.0 16.6  HCT 48.2 50.6   < > 51.0 50.0 49.0  MCV 93.2 93.4  --   --   --  94.0  MCH 31.9 32.3  --   --   --  31.9  MCHC 34.2 34.6  --   --   --  33.9  RDW 13.6 13.7  --   --   --  13.4  PLT 120* 127*  --   --   --  126*   < > = values in this interval not displayed.    Cardiac Enzymes Recent Labs  Lab 01/04/19 2335 01/05/19 0403 01/08/19 2153 01/09/19 0803  TROPONINI <0.03 <0.03 <0.03 <0.03   No results for input(s): TROPIPOC in the last 168 hours.   BNP Recent Labs  Lab 01/04/19 1433  BNP 187.1*     DDimer  Recent Labs  Lab 01/04/19 1724  DDIMER 0.33     Radiology    Dg Chest Portable 1 View  Result Date: 01/08/2019 CLINICAL DATA:  63 y/o  M; shortness of breath. Frequent PVCs. EXAM: PORTABLE CHEST 1 VIEW COMPARISON:  01/04/2019 CT chest and chest radiograph. FINDINGS: Stable cardiomegaly and single lead AICD. Coarse interstitial opacities. Superimposed patchy opacities in the mid and lower lung zones. No pleural effusion or pneumothorax. Transcutaneous pacing pads noted. Bones are unremarkable. IMPRESSION:  Stable cardiomegaly. Interval development of patchy mid and lower lung zone opacities which may edema or pulmonary infiltrate. Electronically Signed   By: Mitzi Hansen M.D.   On: 01/08/2019 23:22    Cardiac Studies   01/05/19   1. The left ventricle has severely reduced systolic function, with an ejection fraction of 20-25%. The cavity size was moderately dilated. There is mild concentric left ventricular hypertrophy. Left ventricular diastolic Doppler parameters are  consistent with impaired relaxation. Left ventricular diffuse hypokinesis.  2. The right ventricle has normal systolic function. The cavity was normal. There is no increase in right ventricular wall thickness.  3. Left atrial size was mild-moderately dilated.  4. Right atrial size was severely dilated.  5. No evidence of mitral valve stenosis.  6. No stenosis of the aortic valve.  7. The inferior vena cava was dilated in size with <50% respiratory variability.   Patient Profile     63 y.o. male with multiple stents, HTN, HLD, OSA and DM-2 and ICD placed 07/19/18 with MDT ICD now admitted for DOE, and multiple PVCs.  Assessment & Plan    DYSPNEA:  Cardiomyopathy as reported.  I have been unable to titrate meds as he becomes symptomatic with higher doses.  He has baseline bradycardia.  Does not appear to have decompensated failure.  CAD:   Recent cath with results as above.  Medical management.  Enzymes negative  ICM s/p ICD:  VT might be causing him symptoms.  He has had no spells overnight with IV amiodarone.  Continue this and EP will see in the AM.  Probably transition to PO and see if VT burden has improved and symptoms abate.    Note:  I could not find a device interrogation although he says this was done in the ED.    For questions or updates, please contact [facility] Please consult [email or web address] for contact info under Cardiology/STEMI.   Signed, Rollene Rotunda, MD  01/09/2019, 11:16 AM

## 2019-01-09 NOTE — Plan of Care (Signed)
?  Problem: Education: ?Goal: Knowledge of General Education information will improve ?Description: Including pain rating scale, medication(s)/side effects and non-pharmacologic comfort measures ?Outcome: Progressing ?  ?Problem: Health Behavior/Discharge Planning: ?Goal: Ability to manage health-related needs will improve ?Outcome: Progressing ?  ?Problem: Clinical Measurements: ?Goal: Ability to maintain clinical measurements within normal limits will improve ?Outcome: Progressing ?Goal: Will remain free from infection ?Outcome: Progressing ?Goal: Diagnostic test results will improve ?Outcome: Progressing ?Goal: Respiratory complications will improve ?Outcome: Progressing ?Goal: Cardiovascular complication will be avoided ?Outcome: Progressing ?  ?Problem: Activity: ?Goal: Risk for activity intolerance will decrease ?Outcome: Progressing ?  ?Problem: Safety: ?Goal: Ability to remain free from injury will improve ?Outcome: Progressing ?  ?

## 2019-01-09 NOTE — Progress Notes (Signed)
  Amiodarone Drug - Drug Interaction Consult Note  Recommendations:none  Amiodarone is metabolized by the cytochrome P450 system and therefore has the potential to cause many drug interactions. Amiodarone has an average plasma half-life of 50 days (range 20 to 100 days).   There is potential for drug interactions to occur several weeks or months after stopping treatment and the onset of drug interactions may be slow after initiating amiodarone.   []  Statins: Increased risk of myopathy. Simvastatin- restrict dose to 20mg  daily. Other statins: counsel patients to report any muscle pain or weakness immediately.  []  Anticoagulants: Amiodarone can increase anticoagulant effect. Consider warfarin dose reduction. Patients should be monitored closely and the dose of anticoagulant altered accordingly, remembering that amiodarone levels take several weeks to stabilize.  []  Antiepileptics: Amiodarone can increase plasma concentration of phenytoin, the dose should be reduced. Note that small changes in phenytoin dose can result in large changes in levels. Monitor patient and counsel on signs of toxicity.  [x]  Beta blockers: increased risk of bradycardia, AV block and myocardial depression. Sotalol - avoid concomitant use.  []   Calcium channel blockers (diltiazem and verapamil): increased risk of bradycardia, AV block and myocardial depression.  []   Cyclosporine: Amiodarone increases levels of cyclosporine. Reduced dose of cyclosporine is recommended.  []  Digoxin dose should be halved when amiodarone is started.  [x]  Diuretics: increased risk of cardiotoxicity if hypokalemia occurs.  []  Oral hypoglycemic agents (glyburide, glipizide, glimepiride): increased risk of hypoglycemia. Patient's glucose levels should be monitored closely when initiating amiodarone therapy.   []  Drugs that prolong the QT interval:  Torsades de pointes risk may be increased with concurrent use - avoid if possible.  Monitor QTc,  also keep magnesium/potassium WNL if concurrent therapy can't be avoided. Marland Kitchen Antibiotics: e.g. fluoroquinolones, erythromycin. . Antiarrhythmics: e.g. quinidine, procainamide, disopyramide, sotalol. . Antipsychotics: e.g. phenothiazines, haloperidol.  . Lithium, tricyclic antidepressants, and methadone. Thank You,  Misty Stanley Stillinger  01/09/2019 10:03 AM

## 2019-01-09 NOTE — ED Notes (Signed)
Wife Mr. Bonita Quin updated about pt's condition and notified about bed placement on 6E15.

## 2019-01-10 MED ORDER — AMIODARONE HCL 400 MG PO TABS: ORAL_TABLET | ORAL | 1 refills | Status: DC

## 2019-01-10 MED ORDER — PRAMIPEXOLE DIHYDROCHLORIDE 1 MG PO TABS
1.0000 mg | ORAL_TABLET | Freq: Once | ORAL | Status: AC
  Administered 2019-01-10: 1 mg via ORAL

## 2019-01-10 NOTE — Progress Notes (Signed)
Progress Note  Patient Name: [name] Date of Encounter: 01/10/2019  Primary Cardiologist: Rollene Rotunda, MD   Subjective   "My breathing is a lot better today."   Inpatient Medications    Scheduled Meds: . amiodarone  400 mg Oral BID  . aspirin EC  81 mg Oral Daily  . carvedilol  3.125 mg Oral BID WC  . clopidogrel  75 mg Oral Daily  . insulin glargine  20 Units Subcutaneous Daily  . magnesium oxide  400 mg Oral BID  . pantoprazole  40 mg Oral QPM  . pramipexole  1 mg Oral TID  . sacubitril-valsartan  1 tablet Oral BID  . spironolactone  12.5 mg Oral Daily   Continuous Infusions:  PRN Meds: acetaminophen, nitroGLYCERIN, ondansetron (ZOFRAN) IV   Vital Signs    Vitals:   01/09/19 1630 01/09/19 2204 01/10/19 0415 01/10/19 0758  BP: 127/80 113/75 120/81 137/74  Pulse: 74 (!) 56 66 72  Resp:      Temp:  (!) 97.5 F (36.4 C) 98.1 F (36.7 C)   TempSrc:  Oral Oral   SpO2:  95% 92%   Weight:   93.4 kg   Height:        Intake/Output Summary (Last 24 hours) at 01/10/2019 1039 Last data filed at 01/10/2019 0920 Gross per 24 hour  Intake 1040.03 ml  Output -  Net 1040.03 ml   Filed Weights   01/09/19 0300 01/10/19 0415  Weight: 94.2 kg 93.4 kg    Telemetry    nsr with pvc's - Personally Reviewed  ECG    none - Personally Reviewed  Physical Exam   GEN: No acute distress.   Neck: No JVD Cardiac: IRRR Respiratory: no increased work of breathing GI:  non-distended  MS: No edema; No deformity. Neuro:  Nonfocal  Psych: Normal affect   Labs    Chemistry Recent Labs  Lab 01/04/19 1711 01/05/19 0403 01/06/19 5397  01/06/19 0819 01/06/19 0840 01/08/19 2153  NA  --  139 139   < > 142 140 135  K  --  4.2 4.2   < > 4.1 4.2 4.4  CL  --  106 107  --   --   --  102  CO2  --  20* 20*  --   --   --  19*  GLUCOSE  --  109* 136*  --   --   --  178*  BUN  --  17 16  --   --   --  21  CREATININE  --  0.91 1.04  --   --   --  1.20  CALCIUM  --   9.0 9.6  --   --   --  9.3  PROT 7.0  --   --   --   --   --  7.1  ALBUMIN 3.8  --   --   --   --   --  4.1  AST 29  --   --   --   --   --  38  ALT 46*  --   --   --   --   --  36  ALKPHOS 44  --   --   --   --   --  49  BILITOT 0.7  --   --   --   --   --  1.5*  GFRNONAA  --  >60 >60  --   --   --  >  60  GFRAA  --  >60 >60  --   --   --  >60  ANIONGAP  --  13 12  --   --   --  14   < > = values in this interval not displayed.     Hematology Recent Labs  Lab 01/05/19 0403 01/06/19 0346  01/06/19 0819 01/06/19 0840 01/08/19 2153  WBC 8.5 9.0  --   --   --  13.1*  RBC 5.17 5.42  --   --   --  5.21  HGB 16.5 17.5*   < > 17.3* 17.0 16.6  HCT 48.2 50.6   < > 51.0 50.0 49.0  MCV 93.2 93.4  --   --   --  94.0  MCH 31.9 32.3  --   --   --  31.9  MCHC 34.2 34.6  --   --   --  33.9  RDW 13.6 13.7  --   --   --  13.4  PLT 120* 127*  --   --   --  126*   < > = values in this interval not displayed.    Cardiac Enzymes Recent Labs  Lab 01/04/19 2335 01/05/19 0403 01/08/19 2153 01/09/19 0803  TROPONINI <0.03 <0.03 <0.03 <0.03   No results for input(s): TROPIPOC in the last 168 hours.   BNP Recent Labs  Lab 01/04/19 1433  BNP 187.1*     DDimer  Recent Labs  Lab 01/04/19 1724  DDIMER 0.33     Radiology    Dg Chest Portable 1 View  Result Date: 01/08/2019 CLINICAL DATA:  63 y/o  M; shortness of breath. Frequent PVCs. EXAM: PORTABLE CHEST 1 VIEW COMPARISON:  01/04/2019 CT chest and chest radiograph. FINDINGS: Stable cardiomegaly and single lead AICD. Coarse interstitial opacities. Superimposed patchy opacities in the mid and lower lung zones. No pleural effusion or pneumothorax. Transcutaneous pacing pads noted. Bones are unremarkable. IMPRESSION: Stable cardiomegaly. Interval development of patchy mid and lower lung zone opacities which may edema or pulmonary infiltrate. Electronically Signed   By: Mitzi Hansen M.D.   On: 01/08/2019 23:22    Cardiac Studies    none  Patient Profile     63 y.o. male admitted with symptomatic PVC's and NSVT in setting of worsening sob. No improved.   Assessment & Plan    1. PVC's/NSVT - he is better on amiodarone. Continue 400 bid for 5 days longer then 400 mg daily and followup with [facility] in the next 3-4 weeks. 2. ICM - he denies anginal symptoms. We will follow. 3. Chronic systolic heart failure - appears euvolemic and dyspnea improved. 4. Disp. - ok for DC home.  For questions or updates, please contact [facility] Please consult [email or web address] for contact info under Cardiology/STEMI.      Signed, Lewayne Bunting, MD  01/10/2019, 10:39 AM  Patient ID: Lanny Cramp, male   DOB: 1956/03/14, 63 y.o.   MRN: [id number]

## 2019-01-10 NOTE — Discharge Instructions (Signed)
YOUR CARDIOLOGY TEAM HAS ARRANGED FOR AN E-VISIT FOR YOUR APPOINTMENT - PLEASE REVIEW IMPORTANT INFORMATION BELOW SEVERAL DAYS PRIOR TO YOUR APPOINTMENT  Due to the recent COVID-19 pandemic, we are transitioning in-person office visits to tele-medicine visits in an effort to decrease unnecessary exposure to our patients, their families, and staff. These visits are billed to your insurance just like a normal visit is. We also encourage you to sign up for MyChart if you have not already done so. You will need a smartphone if possible. For patients that do not have this, we can still complete the visit using a regular telephone but do prefer a smartphone to enable video when possible. You may have a family member that lives with you that can help. If possible, we also ask that you have a blood pressure cuff and scale at home to measure your blood pressure, heart rate and weight prior to your scheduled appointment. Patients with clinical needs that need an in-person evaluation and testing will still be able to come to the office if absolutely necessary. If you have any questions, feel free to call our office.     YOUR PROVIDER WILL BE USING THE FOLLOWING PLATFORM TO COMPLETE YOUR VISIT: Video via smartphone   IF USING MYCHART - How to Download the MyChart App to Your SmartPhone   - If Apple, go to [employer] and type in MyChart in the search bar and download the app. If Android, ask patient to go to [employer] and type in [place] in the search bar and download the app. The app is free but as with any other app downloads, your phone may require you to verify saved payment information or Apple/Android password.  - You will need to then log into the app with your MyChart username and password, and select [facility] as your healthcare provider to link the account.  - When it is time for your visit, go to the MyChart app, find appointments, and click Begin Video Visit. Be sure to Select Allow for your  device to access the Microphone and Camera for your visit. You will then be connected, and your provider will be with you shortly.  **If you have any issues connecting or need assistance, please contact MyChart service desk [phone])**  **If using a computer, in order to ensure the best quality for your visit, you will need to use either of the following Internet Browsers: Agricultural consultant or Microsoft Edge**   IF USING DOXIMITY or [email or web address] - The staff will give you instructions on receiving your link to join the meeting the day of your visit.      2-3 DAYS BEFORE YOUR APPOINTMENT  You will receive a telephone call from one of our [facility] team members - your caller ID may say "Unknown caller." If this is a video visit, we will walk you through how to get the video launched on your phone. We will remind you check your blood pressure, heart rate and weight prior to your scheduled appointment. If you have an Apple Watch or Kardia, please upload any pertinent ECG strips the day before or morning of your appointment to MyChart. Our staff will also make sure you have reviewed the consent and agree to move forward with your scheduled tele-health visit.     THE DAY OF YOUR APPOINTMENT  Approximately 15 minutes prior to your scheduled appointment, you will receive a telephone call from one of [facility] team - your caller ID may say "Unknown  caller."  Our staff will confirm medications, vital signs for the day and any symptoms you may be experiencing. Please have this information available prior to the time of visit start. It may also be helpful for you to have a pad of paper and pen handy for any instructions given during your visit. They will also walk you through joining the smartphone meeting if this is a video visit.    CONSENT FOR TELE-HEALTH VISIT - PLEASE REVIEW  I hereby voluntarily request, consent and authorize [facility] and its employed or contracted physicians,  physician assistants, nurse practitioners or other licensed health care professionals (the Practitioner), to provide me with telemedicine health care services (the Services") as deemed necessary by the treating Practitioner. I acknowledge and consent to receive the Services by the Practitioner via telemedicine. I understand that the telemedicine visit will involve communicating with the Practitioner through live audiovisual communication technology and the disclosure of certain medical information by electronic transmission. I acknowledge that I have been given the opportunity to request an in-person assessment or other available alternative prior to the telemedicine visit and am voluntarily participating in the telemedicine visit.  I understand that I have the right to withhold or withdraw my consent to the use of telemedicine in the course of my care at any time, without affecting my right to future care or treatment, and that the Practitioner or I may terminate the telemedicine visit at any time. I understand that I have the right to inspect all information obtained and/or recorded in the course of the telemedicine visit and may receive copies of available information for a reasonable fee.  I understand that some of the potential risks of receiving the Services via telemedicine include:   Delay or interruption in medical evaluation due to technological equipment failure or disruption;  Information transmitted may not be sufficient (e.g. poor resolution of images) to allow for appropriate medical decision making by the Practitioner; and/or   In rare instances, security protocols could fail, causing a breach of personal health information.  Furthermore, I acknowledge that it is my responsibility to provide information about my medical history, conditions and care that is complete and accurate to the best of my ability. I acknowledge that Practitioner's advice, recommendations, and/or decision may be  based on factors not within their control, such as incomplete or inaccurate data provided by me or distortions of diagnostic images or specimens that may result from electronic transmissions. I understand that the practice of medicine is not an exact science and that Practitioner makes no warranties or guarantees regarding treatment outcomes. I acknowledge that I will receive a copy of this consent concurrently upon execution via email to the email address I last provided but may also request a printed copy by calling the office of [facility].    I understand that my insurance will be billed for this visit.   I have read or had this consent read to me.  I understand the contents of this consent, which adequately explains the benefits and risks of the Services being provided via telemedicine.   I have been provided ample opportunity to ask questions regarding this consent and the Services and have had my questions answered to my satisfaction.  I give my informed consent for the services to be provided through the use of telemedicine in my medical care  By participating in this telemedicine visit I agree to the above.

## 2019-01-10 NOTE — Discharge Summary (Addendum)
Discharge Summary    Patient ID: [name] MRN: [id number]; DOB: September 26, 1955  Admit date: 01/08/2019 Discharge date: 01/10/2019  Primary Care Provider: Hal Morales, NP  Primary Cardiologist: Steve Rotunda, MD  Primary Electrophysiologist:  Steve [name], MD   Discharge Diagnoses    Principal Problem:   Ventricular tachyarrhythmia [name]) Active Problems:   CAD S/P percutaneous coronary angioplasty   Dyslipidemia   OSA (obstructive sleep apnea)   Restless leg syndrome   Essential hypertension   Ischemic cardiomyopathy   VT (ventricular tachycardia) (HCC)   Allergies Allergies  Allergen Reactions  . Brilinta [Ticagrelor] Other (See Comments)    Intolerable dyspnea-changed to Plavix  . Codeine Other (See Comments)    Lightheaded, vision issues  . Lipitor [Atorvastatin Calcium] Other (See Comments)    Muscle pain  . Niaspan [Niacin Er] Other (See Comments)    Head hurts    Diagnostic Studies/Procedures    CXR 01/08/2019 IMPRESSION: Stable cardiomegaly. Interval development of patchy mid and lower lung zone opacities which may edema or pulmonary infiltrate. _____________   History of Present Illness     [name] a 63 yo male with PMH of CAD s/p multiple stents, HTN, HL, OSA, DM and ICD MDT (12/19) who presented with dyspnea and PVCs. He iss/p prior OM1 PCI with DES in 2012. He was cathed in 2014 and 2016and was treated withmedical Rx. He [facility] [place] and was restudiedhis last cath this revealed a patent OM1 stent, CTO of the RCA with L-R collaterals from the CFX, 90% CFX (treated with PCI-DES)see below for picture, 40% pLAD and 90% small Dx1. His EF was 20-25% by echo.Meds were titrated and repeat Echo with EF did improved to 30%. He was sent to EP Dr. Elberta Fortis and had ICD placed in Dec.   A week ago he presented with DOE and chest sensations somewhat different from his historic CP. Had a LHC without a change in anatomy from  last cath thus no PCI. Note to have PVC prior to d/c. Amio was contemplated.  At home today he noted palpitations and chest burning different from his CP. No dizziness, no LEE. Denies ICD shock.   ON tele with multiple PVC and NSVT. 4 beats at the longest I have seen   Hospital Course     Consultants: N/A  His symptom was associated with increased NSVT burden. Patient was admitted to cardiology service and was started on IV amiodarone therapy. Amidarone therapy was transitioned to 400mg  BID on 01/10/2019. The plan is to continue this dose for 5 days, then 40mg  daily and followup with Dr. Elberta Fortis in 3-4 weeks. Otherwise, he did not have any anginal symptom during this admission. He was doing well during the interview with Dr. Ladona Ridgel on 01/10/2019. He was deemed stable for discharge from cardiology perspective.   _____________  Discharge Vitals Blood pressure 137/74, pulse 72, temperature 98.1 F (36.7 C), temperature source Oral, resp. rate 20, height 5\' 10"  (1.778 m), weight 93.4 kg, SpO2 92 %.  Filed Weights   01/09/19 0300 01/10/19 0415  Weight: 94.2 kg 93.4 kg    Labs & Radiologic Studies    CBC Recent Labs    01/08/19 2153  WBC 13.1*  NEUTROABS 8.3*  HGB 16.6  HCT 49.0  MCV 94.0  PLT 126*   Basic Metabolic Panel Recent Labs    81/44/81 2153  NA 135  K 4.4  CL 102  CO2 19*  GLUCOSE 178*  BUN 21  CREATININE  1.20  CALCIUM 9.3  MG 2.3   Liver Function Tests Recent Labs    01/08/19 2153  AST 38  ALT 36  ALKPHOS 49  BILITOT 1.5*  PROT 7.1  ALBUMIN 4.1   No results for input(s): LIPASE, AMYLASE in the last 72 hours. Cardiac Enzymes Recent Labs    01/08/19 2153 01/09/19 0803  TROPONINI <0.03 <0.03   BNP Invalid input(s): POCBNP D-Dimer No results for input(s): DDIMER in the last 72 hours. Hemoglobin A1C No results for input(s): HGBA1C in the last 72 hours. Fasting Lipid Panel No results for input(s): CHOL, HDL, LDLCALC, TRIG, CHOLHDL, LDLDIRECT in  the last 72 hours. Thyroid Function Tests No results for input(s): TSH, T4TOTAL, T3FREE, THYROIDAB in the last 72 hours.  Invalid input(s): FREET3 _____________  Dg Chest 2 View  Result Date: 01/04/2019 CLINICAL DATA:  Chest pain and shortness of breath with fatigue. EXAM: CHEST - 2 VIEW COMPARISON:  07/20/2018. FINDINGS: Cardiomegaly. Single lead pacer good position. Chronic interstitial prominence, without superimposed consolidation or edema. No effusion or pneumothorax. IMPRESSION: 1. Cardiomegaly. No active disease. 2. Chronic interstitial prominence, without superimposed consolidation or edema. Stable appearance from priors. Electronically Signed   By: Elsie StainJohn T Curnes M.D.   On: 01/04/2019 13:28   Ct Angio Chest Pe W Or Wo Contrast  Result Date: 01/04/2019 CLINICAL DATA:  Shortness of breath.  Concern for PE. EXAM: CT ANGIOGRAPHY CHEST WITH CONTRAST TECHNIQUE: Multidetector CT imaging of the chest was performed using the standard protocol during bolus administration of intravenous contrast. Multiplanar CT image reconstructions and MIPs were obtained to evaluate the vascular anatomy. CONTRAST:  100mL OMNIPAQUE IOHEXOL 350 MG/ML SOLN COMPARISON:  Correlation made with chest x-ray from same day. FINDINGS: Cardiovascular: The main pulmonary artery is borderline dilated measuring up to 3 cm. The heart is enlarged. Coronary artery calcifications are noted. There is motion artifact that limits detection of segmental and subsegmental pulmonary emboli. There is reflux of contrast into the IVC consistent was some degree of underlying cardiac dysfunction. Mediastinum/Nodes: No enlarged mediastinal, hilar, or axillary lymph nodes. Thyroid gland, trachea, and esophagus demonstrate no significant findings. Lungs/Pleura: There are upper lobe predominant emphysematous changes. There is bronchiectasis within the bilateral upper lobes. Upper Abdomen: There is cholelithiasis. Otherwise, the upper abdomen is  unremarkable. Musculoskeletal: No chest wall abnormality. No acute or significant osseous findings. Review of the MIP images confirms the above findings. IMPRESSION: 1. No PE identified, however evaluation was somewhat limited by respiratory motion artifact. The main pulmonary artery is borderline dilated which can be seen in patients with elevated PA pressures. 2. Cardiomegaly with reflux of contrast into the IVC consistent with underlying cardiac dysfunction. 3. Cholelithiasis. Aortic Atherosclerosis (ICD10-I70.0) and Emphysema (ICD10-J43.9). Electronically Signed   By: Katherine Mantlehristopher  Green M.D.   On: 01/04/2019 21:01   Dg Chest Portable 1 View  Result Date: 01/08/2019 CLINICAL DATA:  63 y/o  M; shortness of breath. Frequent PVCs. EXAM: PORTABLE CHEST 1 VIEW COMPARISON:  01/04/2019 CT chest and chest radiograph. FINDINGS: Stable cardiomegaly and single lead AICD. Coarse interstitial opacities. Superimposed patchy opacities in the mid and lower lung zones. No pleural effusion or pneumothorax. Transcutaneous pacing pads noted. Bones are unremarkable. IMPRESSION: Stable cardiomegaly. Interval development of patchy mid and lower lung zone opacities which may edema or pulmonary infiltrate. Electronically Signed   By: Mitzi HansenLance  Furusawa-Stratton M.D.   On: 01/08/2019 23:22   Disposition   Pt is being discharged home today in good condition.  Follow-up Plans & Appointments  Follow-up Information    Steve Rotunda, MD Follow up on 01/14/2019.   Specialty:  Cardiology Why:  11:40AM Virtual visit Contact information: [address] [place] [address] [phone]        Regan Lemming, MD Follow up.   Specialty:  Cardiology Why:  office scheduler Steve contact you to arrange 3-4 weeks followup/ Contact information: [address] [place] [address] [phone]            Discharge Medications   Allergies as of 01/10/2019      Reactions   Brilinta  [ticagrelor] Other (See Comments)   Intolerable dyspnea-changed to Plavix   Codeine Other (See Comments)   Lightheaded, vision issues   Lipitor [atorvastatin Calcium] Other (See Comments)   Muscle pain   Niaspan [niacin Er] Other (See Comments)   Head hurts      Medication List    TAKE these medications   acetaminophen 500 MG tablet Commonly known as:  TYLENOL Take 500 mg by mouth every 6 (six) hours as needed (for headaches.).   albuterol 108 (90 Base) MCG/ACT inhaler Commonly known as:  VENTOLIN HFA Inhale 1-2 puffs into the lungs every 6 (six) hours as needed for wheezing or shortness of breath.   amiodarone 400 MG tablet Commonly known as:  PACERONE 1 tablet twice a day for 5 days, then 1 tablet daily thereafter   aspirin 81 MG chewable tablet Chew 1 tablet (81 mg total) by mouth daily.   carvedilol 3.125 MG tablet Commonly known as:  COREG Take 1 tablet (3.125 mg total) by mouth 2 (two) times daily with a meal.   CINNAMON PO Take 1,000 mg by mouth daily.   clopidogrel 75 MG tablet Commonly known as:  PLAVIX Take 1 tablet (75 mg total) by mouth daily.   Co Q 10 100 MG Caps Take 100 mg by mouth daily.   Jardiance 10 MG Tabs tablet Generic drug:  empagliflozin Take 10 mg by mouth daily.   magnesium oxide 400 MG tablet Commonly known as:  MAG-OX Take 400 mg by mouth 2 (two) times daily.   metFORMIN 1000 MG tablet Commonly known as:  GLUCOPHAGE Take 1,000 mg by mouth 2 (two) times daily.   nitroGLYCERIN 0.4 MG SL tablet Commonly known as:  NITROSTAT Place 1 tablet (0.4 mg total) under the tongue every 5 (five) minutes as needed for chest pain.   pantoprazole 40 MG tablet Commonly known as:  PROTONIX Take 1 tablet (40 mg total) by mouth every evening. What changed:  when to take this   pramipexole 1 MG tablet Commonly known as:  MIRAPEX Take 1 mg by mouth 2 (two) times a day.   sacubitril-valsartan 24-26 MG Commonly known as:  Entresto Take 1 tablet  by mouth 2 (two) times daily.   spironolactone 25 MG tablet Commonly known as:  ALDACTONE Take 0.5 tablets (12.5 mg total) by mouth daily.   Toujeo SoloStar 300 UNIT/ML Sopn Generic drug:  Insulin Glargine (1 Unit Dial) Inject 20 Units into the skin daily.        Acute coronary syndrome (MI, NSTEMI, STEMI, etc) this admission?: No.    Outstanding Labs/Studies   None  Duration of Discharge Encounter   Greater than 30 minutes including physician time.  Ramond Dial, PA 01/10/2019, 11:13 AM  EP Attending  Patient seen and examined. Agree with the findings as noted above. The patient is stable for DC with followup as above.  Leonia Reeves.D.

## 2019-01-11 ENCOUNTER — Telehealth: Payer: Self-pay

## 2019-01-11 NOTE — Telephone Encounter (Signed)
Spoke w/ pt wife and informed her that I am no sure why the light is lite up. Instructed pt wife to call tech support for further instruction.

## 2019-01-11 NOTE — Telephone Encounter (Signed)
I got a message that the pt monitor is blinking a light. I checked [employer] and the pt monitor has been disconnected for 22 days. I asked the pt was the monitor plugged in he states it was. We tried to send a manual transmission however the monitor screen was black. I gave him the number to [facility] tech support to get additional help.

## 2019-01-12 ENCOUNTER — Telehealth: Payer: Self-pay | Admitting: Cardiology

## 2019-01-12 NOTE — Telephone Encounter (Signed)
CALL PT CELL FIRST [phone] pt wants to try for video visit, says might have to be phone-only, pre-reg complete, mychart pending, verbal consent given 01/12/2019 MS

## 2019-01-12 NOTE — Telephone Encounter (Signed)
Advised patient that transmission was received. Presenting rhythm suggests Vs w/bigeminal PVCs. 17 "AF" episodes (6.9% burden since 01/08/19)--all available EGMs suggest SR w/PVCs.  Pt reports he feels much better since hospital d/c on 01/10/19. Not aware of PVCs, ShOB improved. Taking amiodarone 400mg  BID as instructed. Pt is due for f/u with Dr. Elberta Fortis in 3-4 weeks per d/c summary, will send message to scheduler. Pt denies questions at this time and thanked me for my call.

## 2019-01-13 ENCOUNTER — Telehealth: Payer: Self-pay | Admitting: Cardiology

## 2019-01-13 NOTE — Telephone Encounter (Signed)
°*  STAT* If patient is at the pharmacy, call can be transferred to refill team.   1. Which medications need to be refilled? (please list name of each medication and dose if known)  New prescriptions for Clopidogrel, Carvedilol, Entresto and Spironolactone  2. Which pharmacy/location (including street and city if local pharmacy) is medication to be sent to? [place] [phone]  3. Do they need a 30 day or 90 day supply? 90 days and refills

## 2019-01-13 NOTE — Progress Notes (Signed)
Virtual Visit via Video Note   This visit type was conducted due to national recommendations for restrictions regarding the COVID-19 Pandemic (e.g. social distancing) in an effort to limit this patient's exposure and mitigate transmission in our community.  Due to his co-morbid illnesses, this patient is at least at moderate risk for complications without adequate follow up.  This format is felt to be most appropriate for this patient at this time.  All issues noted in this document were discussed and addressed.  A limited physical exam was performed with this format.  Please refer to the patient's chart for his consent to telehealth for [facility].   Date:  01/14/2019   ID:  [name], DOB [birth year], MRN [id number]  Patient Location: Home Provider Location: Home  PCP:  Hal MoralesGunter, Tara G, NP  Cardiologist:  Rollene RotundaJames Cai Flott, MD  Electrophysiologist:  Regan LemmingWill Martin Camnitz, MD   Evaluation Performed:  Follow-Up Visit  Chief Complaint:  PVCs  History of Present Illness:    Steve CrampGerald F Ramos is a 63 y.o. male ofcoronary disease and VT.He iss/p prior OM1 PCI with DES in 2012. He was cathed in 2014 and 2016and was treated withmedical Rx. [facility] and was restudiedhis last cath this revealed a patent OM1 stent, CTO of the RCA with L-R collaterals from the CFX, 90% CFX (treated with PCI-DES)see below for picture, 40% pLAD and 90% small Dx1. His EF was 20-25% by echo.Meds were titrated and repeat Echo with EF did improved to 30%. He was sent to EP Dr. Elberta Fortisamnitz and had ICD placed in Dec. He was recently hospitalized for VT.  He was treated with IV and the PO amiodarone.  He was seen by EP.     Since getting out of the hospital he feels well.  The patient denies any new symptoms such as chest discomfort, neck or arm discomfort. There has been no new shortness of breath, PND or orthopnea. There have been no reported palpitations, presyncope or syncope.  His pulse  looks like it runs low but I suspect his cuff is undercounting PVCs.   The patient does not have symptoms concerning for COVID-19 infection (fever, chills, cough, or new shortness of breath).    Past Medical History:  Diagnosis Date   ASCVD (arteriosclerotic cardiovascular disease)    a. MI 2004, did not seek care at that time. b. s/p PTCA/BMS to OM1 04/2011 (known totally occluded RCA with L-R collaterals and failed angioplasty on that vessel). c.)  Cath 2019 chronically occluded right coronary artery with collateralization.  90% circumflex stenosis treated with a DES.   Chronic bronchitis (HCC)    HTN (hypertension)    Hyperlipidemia    Ischemic cardiomyopathy    EF 30%.     OSA (obstructive sleep apnea) 05/24/11   Restless leg syndrome 06/25/2011   Type 2 diabetes mellitus [facility](HCC)    Past Surgical History:  Procedure Laterality Date   CARDIAC CATHETERIZATION  11/24/2012   40-50% ostial LAD, 40% mid LAD, 60-70% ostial D1, 40% proximal LCx, patent OM branch stent, 99% serial proximal-mid RCA lesions, 100% mid RCA occlusion (chronic with left to right collateralization); EF 40%, inferior wall hypokinesis.   CARDIAC CATHETERIZATION N/A 05/31/2015   Procedure: Left Heart Cath and Coronary Angiography;  Surgeon: Kathleene Hazelhristopher D McAlhany, MD;  [facility];  Service: Cardiovascular;  Laterality: N/A;   CORONARY STENT INTERVENTION N/A 12/25/2017   Procedure: CORONARY STENT INTERVENTION;  Surgeon: Swazil[place]Jordan, Peter M, MD;  Location: [facility] INVASIVE CV LAB;  Service: Cardiovascular;  Laterality: N/A;   CORONARY STENT PLACEMENT  04/2011   CYSTECTOMY     Spine and jaw   ICD IMPLANT N/A 07/19/2018   Procedure: ICD IMPLANT;  Surgeon: Regan Lemming, MD;  Location: [facility] INVASIVE CV LAB;  Service: Cardiovascular;  Laterality: N/A;   LEFT HEART CATH AND CORONARY ANGIOGRAPHY N/A 12/25/2017   Procedure: LEFT HEART CATH AND CORONARY ANGIOGRAPHY;  Surgeon: Swaziland, Peter M, MD;   Location: [facility] INVASIVE CV LAB;  Service: Cardiovascular;  Laterality: N/A;   LEFT HEART CATHETERIZATION WITH CORONARY ANGIOGRAM N/A 11/24/2012   Procedure: LEFT HEART CATHETERIZATION WITH CORONARY ANGIOGRAM;  Surgeon: Kathleene Hazel, MD;  [facility];  Service: Cardiovascular;  Laterality: N/A;   lesion removed from foot     right foot   RIGHT/LEFT HEART CATH AND CORONARY ANGIOGRAPHY N/A 01/06/2019   Procedure: RIGHT/LEFT HEART CATH AND CORONARY ANGIOGRAPHY;  Surgeon: Runell Gess, MD;  Location: [facility] INVASIVE CV LAB;  Service: Cardiovascular;  Laterality: N/A;   TONSILLECTOMY AND ADENOIDECTOMY       Prior to Admission medications   Medication Sig Start Date End Date Taking? Authorizing Provider  acetaminophen (TYLENOL) 500 MG tablet Take 500 mg by mouth every 6 (six) hours as needed (for headaches.).   Yes [provider]  albuterol (PROVENTIL HFA;VENTOLIN HFA) 108 (90 Base) MCG/ACT inhaler Inhale 1-2 puffs into the lungs every 6 (six) hours as needed for wheezing or shortness of breath.   Yes [provider]  amiodarone (PACERONE) 400 MG tablet 1 tablet twice a day for 5 days, then 1 tablet daily thereafter 01/10/19  Yes Azalee Course, PA  aspirin 81 MG chewable tablet Chew 1 tablet (81 mg total) by mouth daily. 01/06/19  Yes Arty Baumgartner, NP  carvedilol (COREG) 3.125 MG tablet Take 1 tablet (3.125 mg total) by mouth 2 (two) times daily with a meal. 01/14/19  Yes Rollene Rotunda, MD  CINNAMON PO Take 1,000 mg by mouth daily.   Yes [provider]  clopidogrel (PLAVIX) 75 MG tablet Take 1 tablet (75 mg total) by mouth daily. 01/14/19  Yes Rollene Rotunda, MD  Coenzyme Q10 (CO Q 10) 100 MG CAPS Take 100 mg by mouth daily.   Yes [provider]  JARDIANCE 10 MG TABS tablet Take 10 mg by mouth daily. 10/05/17  Yes [provider]  magnesium oxide (MAG-OX) 400 MG tablet Take 400 mg by mouth 2 (two) times daily.   Yes [provider]  metFORMIN (GLUCOPHAGE) 1000 MG tablet Take 1,000 mg by mouth 2 (two) times daily. 10/05/17  Yes [provider]  nitroGLYCERIN (NITROSTAT) 0.4 MG SL tablet Place 1 tablet (0.4 mg total) under the tongue every 5 (five) minutes as needed for chest pain. 04/20/15  Yes Rollene Rotunda, MD  pantoprazole (PROTONIX) 40 MG tablet Take 1 tablet (40 mg total) by mouth every evening. Patient taking differently: Take 40 mg by mouth daily.  03/29/13  Yes Rollene Rotunda, MD  pramipexole (MIRAPEX) 1 MG tablet Take 1 mg by mouth 2 (two) times a day.    Yes [provider]  sacubitril-valsartan (ENTRESTO) 24-26 MG Take 1 tablet by mouth 2 (two) times daily. 01/14/19  Yes Rollene Rotunda, MD  spironolactone (ALDACTONE) 25 MG tablet Take 0.5 tablets (12.5 mg total) by mouth daily. 01/14/19  Yes Mikalyn Hermida, Fayrene Fearing, MD  TOUJEO SOLOSTAR 300 UNIT/ML SOPN Inject 20 Units into the skin daily. 10/14/18  Yes [provider]     Allergies:   Brilinta [ticagrelor]; Codeine; Lipitor [atorvastatin calcium]; and Niaspan [niacin er]   Social History   Tobacco Use   Smoking status: Former Smoker    Packs/day: 1.50    Years: 35.00    Pack years: 52.50    Types: Cigarettes    Last attempt to quit: 08/18/2005    Years since quitting: 13.4   Smokeless tobacco: Never Used  Substance Use Topics   Alcohol use: No   Drug use: No     Family Hx: The patient's family history includes Heart attack (age of onset: 91) in his brother; Heart attack (age of onset: 32) in his father; Lymphoma in his mother.  ROS:   Please see the history of present illness.    As stated in the HPI and negative for all other systems.   Prior CV studies:   The following studies were reviewed today:  Hospital records  Labs/Other Tests and Data Reviewed:    EKG:  No ECG reviewed.  Recent Labs: 01/21/2018: NT-Pro BNP 39 01/04/2019: B Natriuretic Peptide 187.1; TSH 2.742 01/08/2019: ALT 36; BUN 21;  Creatinine, Ser 1.20; Hemoglobin 16.6; Magnesium 2.3; Platelets 126; Potassium 4.4; Sodium 135   Recent Lipid Panel Lab Results  Component Value Date/Time   CHOL 188 01/05/2019 04:03 AM   TRIG 336 (H) 01/05/2019 04:03 AM   HDL 33 (L) 01/05/2019 04:03 AM   CHOLHDL 5.7 01/05/2019 04:03 AM   LDLCALC 88 01/05/2019 04:03 AM    Wt Readings from Last 3 Encounters:  01/14/19 205 lb (93 kg)  01/10/19 205 lb 14.4 oz (93.4 kg)  01/06/19 207 lb 6.4 oz (94.1 kg)     Objective:    Vital Signs:  BP 110/65    Pulse (!) 37    Ht 5\' 10"  (1.778 m)    Wt 205 lb (93 kg)    BMI 29.41 kg/m    VITAL SIGNS:  reviewed GEN:  no acute distress NEURO:  alert and oriented x 3, no obvious focal deficit PSYCH:  normal affect  ASSESSMENT & PLAN:    ICM s/p ICD:    He has been treated for VT and is now on amiodarone.    CAD:   He has had no recent symptoms suggestive of unstable angina.   No change in therapy.   DYSPNEA:  No complaints.  No further work up.     COVID-19 Education: The signs and symptoms of COVID-19 were discussed with the patient and how to seek care for testing (follow up with PCP or arrange E-visit).  The importance of social distancing was discussed today.  Time:   Today, I have spent 17 minutes with the patient with telehealth technology discussing the above problems.     Medication Adjustments/Labs and Tests Ordered: Current medicines are reviewed at length with the patient today.  Concerns regarding medicines are outlined above.   Tests Ordered: No orders of the defined types were placed in this encounter.   Medication Changes: No orders of the defined types were placed in this encounter.   Disposition:  Follow up with me in the office in four months  Signed, Rollene Rotunda, MD  01/14/2019 11:56 AM    [facility]

## 2019-01-14 ENCOUNTER — Other Ambulatory Visit: Payer: Self-pay

## 2019-01-14 ENCOUNTER — Telehealth (INDEPENDENT_AMBULATORY_CARE_PROVIDER_SITE_OTHER): Payer: PRIVATE HEALTH INSURANCE | Admitting: Cardiology

## 2019-01-14 ENCOUNTER — Encounter: Payer: Self-pay | Admitting: Cardiology

## 2019-01-14 VITALS — BP 110/65 | HR 37 | Ht 70.0 in | Wt 205.0 lb

## 2019-01-14 DIAGNOSIS — I2510 Atherosclerotic heart disease of native coronary artery without angina pectoris: Secondary | ICD-10-CM | POA: Insufficient documentation

## 2019-01-14 DIAGNOSIS — I472 Ventricular tachycardia, unspecified: Secondary | ICD-10-CM

## 2019-01-14 DIAGNOSIS — Z7189 Other specified counseling: Secondary | ICD-10-CM | POA: Insufficient documentation

## 2019-01-14 MED ORDER — SACUBITRIL-VALSARTAN 24-26 MG PO TABS: 1.0000 | ORAL_TABLET | Freq: Two times a day (BID) | ORAL | 1 refills | Status: DC

## 2019-01-14 MED ORDER — CLOPIDOGREL BISULFATE 75 MG PO TABS: 75.0000 mg | ORAL_TABLET | Freq: Every day | ORAL | 1 refills | Status: DC

## 2019-01-14 MED ORDER — SPIRONOLACTONE 25 MG PO TABS: 12.5000 mg | ORAL_TABLET | Freq: Every day | ORAL | 1 refills | Status: DC

## 2019-01-14 MED ORDER — CARVEDILOL 3.125 MG PO TABS: 3.1250 mg | ORAL_TABLET | Freq: Two times a day (BID) | ORAL | 1 refills | Status: DC

## 2019-01-14 NOTE — Patient Instructions (Signed)
Medication Instructions:  Continue current medications  If you need a refill on your cardiac medications before your next appointment, please call your pharmacy.  Labwork: None Ordered .  Testing/Procedures: None Ordered  Follow-Up: You will need a follow up appointment in 4 months.  Please call our office 2 months in advance to schedule this appointment.  You may see James Hochrein, MD or one of the following Advanced Practice Providers on your designated Care Team:   Rhonda Barrett, PA-C . Kathryn Lawrence, DNP, ANP    At [facility], you and your health needs are our priority.  As part of our continuing mission to provide you with exceptional heart care, we have created designated Provider Care Teams.  These Care Teams include your primary Cardiologist (physician) and Advanced Practice Providers (APPs -  Physician Assistants and Nurse Practitioners) who all work together to provide you with the care you need, when you need it.  Thank you for choosing [facility] at [facility]!!     

## 2019-01-24 ENCOUNTER — Ambulatory Visit (INDEPENDENT_AMBULATORY_CARE_PROVIDER_SITE_OTHER): Payer: PRIVATE HEALTH INSURANCE | Admitting: *Deleted

## 2019-01-24 DIAGNOSIS — I5022 Chronic systolic (congestive) heart failure: Secondary | ICD-10-CM

## 2019-01-24 DIAGNOSIS — I255 Ischemic cardiomyopathy: Secondary | ICD-10-CM | POA: Diagnosis not present

## 2019-01-24 LAB — CUP PACEART REMOTE DEVICE CHECK
Battery Remaining Longevity: 133 mo
Battery Voltage: 3.02 V
Brady Statistic RV Percent Paced: 1.63 %
Date Time Interrogation Session: 20200608073523
HighPow Impedance: 79 Ohm
Implantable Lead Implant Date: 20191202
Implantable Lead Location: 753860
Implantable Pulse Generator Implant Date: 20191202
Lead Channel Impedance Value: 285 Ohm
Lead Channel Impedance Value: 380 Ohm
Lead Channel Pacing Threshold Amplitude: 0.75 V
Lead Channel Pacing Threshold Pulse Width: 0.4 ms
Lead Channel Sensing Intrinsic Amplitude: 2.75 mV
Lead Channel Sensing Intrinsic Amplitude: 2.75 mV
Lead Channel Setting Pacing Amplitude: 2.5 V
Lead Channel Setting Pacing Pulse Width: 0.4 ms
Lead Channel Setting Sensing Sensitivity: 0.3 mV

## 2019-01-27 ENCOUNTER — Telehealth (INDEPENDENT_AMBULATORY_CARE_PROVIDER_SITE_OTHER): Payer: PRIVATE HEALTH INSURANCE | Admitting: Cardiology

## 2019-01-27 ENCOUNTER — Other Ambulatory Visit: Payer: Self-pay

## 2019-01-27 DIAGNOSIS — I255 Ischemic cardiomyopathy: Secondary | ICD-10-CM | POA: Diagnosis not present

## 2019-01-27 MED ORDER — AMIODARONE HCL 200 MG PO TABS: 200.0000 mg | ORAL_TABLET | Freq: Every day | ORAL | 2 refills | Status: DC

## 2019-01-27 NOTE — Progress Notes (Signed)
Electrophysiology TeleHealth Note   Due to national recommendations of social distancing due to COVID 19, an audio/video telehealth visit is felt to be most appropriate for this patient at this time.  See Epic message for the patient's consent to telehealth for [facility].   Date:  01/27/2019   ID:  [name], DOB October 29, 1955, MRN [id number]  Location: patient's home  Provider location: [address], [place]  Evaluation Performed: Follow-up visit  PCP:  Charlynn Court, NP  Cardiologist:  Minus Breeding, MD  Electrophysiologist:  Dr Curt Bears  Chief Complaint:  ICD  History of Present Illness:    [name] is a 63 y.o. male who presents via audio/video conferencing for a telehealth visit today.  Since last being seen in our clinic, the patient reports doing very well.  Today, he denies symptoms of palpitations, chest pain, shortness of breath,  lower extremity edema, dizziness, presyncope, or syncope.  The patient is otherwise without complaint today.  The patient denies symptoms of fevers, chills, cough, or new SOB worrisome for COVID 19.  He has a history of coronary artery disease status post multiple stents, hypertension, hyperlipidemia, OSA, diabetes, Medtronic ICD.  He presented to the hospital May 2020 with PVCs and nonsustained VT.  He was started on amiodarone.  He had a left heart catheterization that showed stable coronary artery disease.  Today, denies symptoms of palpitations, chest pain, shortness of breath, orthopnea, PND, lower extremity edema, claudication, dizziness, presyncope, syncope, bleeding, or neurologic sequela. The patient is tolerating medications without difficulties.  Since being discharged from the hospital he is felt much improved.  He has no chest pain or shortness of breath.  He is able to do all of his daily activities.  He feels like the amiodarone is made a big difference in his overall wellbeing.  Past Medical History:   Diagnosis Date  . ASCVD (arteriosclerotic cardiovascular disease)    a. MI 2004, did not seek care at that time. b. s/p PTCA/BMS to OM1 04/2011 (known totally occluded RCA with L-R collaterals and failed angioplasty on that vessel). c.)  Cath 2019 chronically occluded right coronary artery with collateralization.  90% circumflex stenosis treated with a DES.  Marland Kitchen Chronic bronchitis ([place])   . HTN (hypertension)   . Hyperlipidemia   . Ischemic cardiomyopathy    EF 30%.    . OSA (obstructive sleep apnea) 05/24/11  . Restless leg syndrome 06/25/2011  . Type 2 diabetes mellitus (Uncertain)     Past Surgical History:  Procedure Laterality Date  . CARDIAC CATHETERIZATION  11/24/2012   40-50% ostial LAD, 40% mid LAD, 60-70% ostial D1, 40% proximal LCx, patent OM branch stent, 99% serial proximal-mid RCA lesions, 100% mid RCA occlusion (chronic with left to right collateralization); EF 40%, inferior wall hypokinesis.  Marland Kitchen CARDIAC CATHETERIZATION N/A 05/31/2015   Procedure: Left Heart Cath and Coronary Angiography;  Surgeon: Burnell Blanks, MD;  Location: [place] [facility];  Service: Cardiovascular;  Laterality: N/A;  . CORONARY STENT INTERVENTION N/A 12/25/2017   Procedure: CORONARY STENT INTERVENTION;  Surgeon: Martinique, Peter M, MD;  Location: [place] [facility];  Service: Cardiovascular;  Laterality: N/A;  . CORONARY STENT PLACEMENT  04/2011  . CYSTECTOMY     Spine and jaw  . ICD IMPLANT N/A 07/19/2018   Procedure: ICD IMPLANT;  Surgeon: Constance Haw, MD;  Location: [place] [facility];  Service: Cardiovascular;  Laterality: N/A;  . LEFT HEART CATH  AND CORONARY ANGIOGRAPHY N/A 12/25/2017   Procedure: LEFT HEART CATH AND CORONARY ANGIOGRAPHY;  Surgeon: Swaziland, Peter M, MD;  Location: [facility] INVASIVE CV LAB;  Service: Cardiovascular;  Laterality: N/A;  . LEFT HEART CATHETERIZATION WITH CORONARY ANGIOGRAM N/A 11/24/2012   Procedure: LEFT HEART CATHETERIZATION WITH CORONARY ANGIOGRAM;  Surgeon:  Kathleene Hazel, MD;  Location: [facility] CATH LAB;  Service: Cardiovascular;  Laterality: N/A;  . lesion removed from foot     right foot  . RIGHT/LEFT HEART CATH AND CORONARY ANGIOGRAPHY N/A 01/06/2019   Procedure: RIGHT/LEFT HEART CATH AND CORONARY ANGIOGRAPHY;  Surgeon: Runell Gess, MD;  Location: [facility] INVASIVE CV LAB;  Service: Cardiovascular;  Laterality: N/A;  . TONSILLECTOMY AND ADENOIDECTOMY      Current Outpatient Medications  Medication Sig Dispense Refill  . acetaminophen (TYLENOL) 500 MG tablet Take 500 mg by mouth every 6 (six) hours as needed (for headaches.).    Marland Kitchen albuterol (PROVENTIL HFA;VENTOLIN HFA) 108 (90 Base) MCG/ACT inhaler Inhale 1-2 puffs into the lungs every 6 (six) hours as needed for wheezing or shortness of breath.    Marland Kitchen amiodarone (PACERONE) 400 MG tablet 1 tablet twice a day for 5 days, then 1 tablet daily thereafter 35 tablet 1  . aspirin 81 MG chewable tablet Chew 1 tablet (81 mg total) by mouth daily.    . carvedilol (COREG) 3.125 MG tablet Take 1 tablet (3.125 mg total) by mouth 2 (two) times daily with a meal. 180 tablet 1  . CINNAMON PO Take 1,000 mg by mouth daily.    . clopidogrel (PLAVIX) 75 MG tablet Take 1 tablet (75 mg total) by mouth daily. 90 tablet 1  . Coenzyme Q10 (CO Q 10) 100 MG CAPS Take 100 mg by mouth daily.    Marland Kitchen JARDIANCE 10 MG TABS tablet Take 10 mg by mouth daily.    . magnesium oxide (MAG-OX) 400 MG tablet Take 400 mg by mouth 2 (two) times daily.    . metFORMIN (GLUCOPHAGE) 1000 MG tablet Take 1,000 mg by mouth 2 (two) times daily.    . nitroGLYCERIN (NITROSTAT) 0.4 MG SL tablet Place 1 tablet (0.4 mg total) under the tongue every 5 (five) minutes as needed for chest pain. 30 tablet 1  . pantoprazole (PROTONIX) 40 MG tablet Take 1 tablet (40 mg total) by mouth every evening. (Patient taking differently: Take 40 mg by mouth daily. ) 30 tablet 0  . pramipexole (MIRAPEX) 1 MG tablet Take 1 mg by mouth 2 (two) times a day.     .  sacubitril-valsartan (ENTRESTO) 24-26 MG Take 1 tablet by mouth 2 (two) times daily. 180 tablet 1  . spironolactone (ALDACTONE) 25 MG tablet Take 0.5 tablets (12.5 mg total) by mouth daily. 45 tablet 1  . TOUJEO SOLOSTAR 300 UNIT/ML SOPN Inject 20 Units into the skin daily.     No current facility-administered medications for this visit.     Allergies:   Brilinta [ticagrelor], Codeine, Lipitor [atorvastatin calcium], and Niaspan [niacin er]   Social History:  The patient  reports that he quit smoking about 13 years ago. His smoking use included cigarettes. He has a 52.50 pack-year smoking history. He has never used smokeless tobacco. He reports that he does not drink alcohol or use drugs.   Family History:  The patient's  family history includes Heart attack (age of onset: 62) in his brother; Heart attack (age of onset: 44) in his father; Lymphoma in his mother.   ROS:  Please see  the history of present illness.   All other systems are personally reviewed and negative.    Exam:    Vital Signs:  BP 120/62   Pulse (!) 59   SpO2 95%   Well appearing, alert and conversant, regular work of breathing,  good skin color Eyes- anicteric, neuro- grossly intact, skin- no apparent rash or lesions or cyanosis, mouth- oral mucosa is pink   Labs/Other Tests and Data Reviewed:    Recent Labs: 01/04/2019: B Natriuretic Peptide 187.1; TSH 2.742 01/08/2019: ALT 36; BUN 21; Creatinine, Ser 1.20; Hemoglobin 16.6; Magnesium 2.3; Platelets 126; Potassium 4.4; Sodium 135   Wt Readings from Last 3 Encounters:  01/14/19 205 lb (93 kg)  01/10/19 205 lb 14.4 oz (93.4 kg)  01/06/19 207 lb 6.4 oz (94.1 kg)     Other studies personally reviewed: Additional studies/ records that were reviewed today include: LHC 01/06/19  Review of the above records today demonstrates:    Ost RCA to Prox RCA lesion is 95% stenosed.  Prox RCA to Mid RCA lesion is 95% stenosed.  Dist RCA lesion is 100% stenosed.   Previously placed Ost 2nd Mrg to 2nd Mrg stent (unknown type) is widely patent.  Previously placed Ost Cx to Prox Cx stent (unknown type) is widely patent.  Prox Cx lesion is 50% stenosed.  Ost LAD to Prox LAD lesion is 50% stenosed.  Ost 1st Diag to 1st Diag lesion is 95% stenosed.  ECG 01/08/2019 personally reviewed Sinus rhythm, PVCs   Last device remote is reviewed from [employer] PDF dated 01/24/2019 which reveals normal device function, no arrhythmias    ASSESSMENT & PLAN:    1.  Coronary artery disease: Status post multiple stents.  Most recent catheterization showed stable disease.  No current chest pain.  2.  Ischemic cardiomyopathy: Status post Medtronic ICD implanted 09/29/2017.  Device functioning appropriately.  No changes.  3.  Hypertension: Currently well controlled.  4.  Nonsustained VT: Currently on amiodarone.  Most recent device interrogation without major abnormality.  No changes.  He is currently taking 400 mg a day.  I told him to go down to 200 mg a day after this week.  He Luann Aspinwall need a new prescription for 200 mg tabs.  In the interim, he Dayle Mcnerney cut his 400 mg tabs in half.  COVID 19 screen The patient denies symptoms of COVID 19 at this time.  The importance of social distancing was discussed today.  Follow-up: 6 months  Current medicines are reviewed at length with the patient today.   The patient does not have concerns regarding his medicines.  The following changes were made today: Decrease amiodarone in 1 week  Labs/ tests ordered today include:  No orders of the defined types were placed in this encounter.    Patient Risk:  after full review of this patients clinical status, I feel that they are at moderate risk at this time.  Today, I have spent 10 minutes with the patient with telehealth technology discussing PVCs.    Signed, Koralynn Greenspan Jorja LoaMartin Karsyn Rochin, MD  01/27/2019 9:29 AM     [facility] [address] [place] [address]  [phone] (office) [phone] (fax)

## 2019-01-27 NOTE — Addendum Note (Signed)
Addended by: Stanton Kidney on: 01/27/2019 09:51 AM   Modules accepted: Orders

## 2019-02-01 NOTE — Progress Notes (Signed)
Remote ICD transmission.   

## 2019-03-17 ENCOUNTER — Telehealth: Payer: Self-pay | Admitting: Cardiology

## 2019-03-17 NOTE — Telephone Encounter (Signed)
S/w Hayley she states that she will send to plan and to plan and to check later as she will update forms and we will need to answer clinical questions later.

## 2019-03-17 NOTE — Telephone Encounter (Signed)
New message   Pt c/o medication issue:  1. Name of Medication: sacubitril-valsartan (ENTRESTO) 24-26 MG  2. How are you currently taking this medication (dosage and times per day)? Twice daily  3. Are you having a reaction (difficulty breathing--STAT)? No   4. What is your medication issue? Per Rolla Plate at [employer] [facility] [employer] needs Prior authorization for this medication.

## 2019-03-17 NOTE — Telephone Encounter (Signed)
In process 

## 2019-03-29 ENCOUNTER — Telehealth: Payer: Self-pay | Admitting: Cardiology

## 2019-03-29 NOTE — Telephone Encounter (Signed)
Returned call to wife/patient.   Patient reports SOB, has worsened over the last month. He reports he is "not as bad as he was before being put on amiodarone". Patient reports feeling better on higher dose of amiodarone (400mg ). His dizziness improved on higher dose but he is having some dizziness when changing positions (sitting > standing).  Patient's SOB is with minimal exertion (walking). Patient reports BP and HR are doing good - HR mid 50s-mid 60s, BP 110s/60s.   He had to stop twice today to catch breath while walking to his truck after fishing this morning with his grandson.  Patient denies chest pain, palpitations.   Will message MD for advice

## 2019-03-29 NOTE — Telephone Encounter (Signed)
New Message   Pt c/o Shortness Of Breath: STAT if SOB developed within the last 24 hours or pt is noticeably SOB on the phone  1. Are you currently SOB (can you hear that pt is SOB on the phone)? I did not speak with the patient, wife called and states that he is sob  2. How long have you been experiencing SOB? Since Monday  3. Are you SOB when sitting or when up moving around? Both, off and on all day regardless of what he is doing   4. Are you currently experiencing any other symptoms? Fatigue

## 2019-03-30 NOTE — Telephone Encounter (Signed)
Spoke with patient and scheduled him for [place] PA office visit on 8/13 @ 3:15pm

## 2019-03-31 ENCOUNTER — Other Ambulatory Visit: Payer: Self-pay

## 2019-03-31 ENCOUNTER — Ambulatory Visit: Payer: PRIVATE HEALTH INSURANCE | Admitting: Cardiology

## 2019-03-31 ENCOUNTER — Encounter: Payer: Self-pay | Admitting: Cardiology

## 2019-03-31 VITALS — BP 100/68 | HR 57 | Temp 97.3°F | Ht 70.0 in | Wt 218.0 lb

## 2019-03-31 DIAGNOSIS — Z794 Long term (current) use of insulin: Secondary | ICD-10-CM

## 2019-03-31 DIAGNOSIS — I255 Ischemic cardiomyopathy: Secondary | ICD-10-CM

## 2019-03-31 DIAGNOSIS — I472 Ventricular tachycardia, unspecified: Secondary | ICD-10-CM

## 2019-03-31 DIAGNOSIS — I2510 Atherosclerotic heart disease of native coronary artery without angina pectoris: Secondary | ICD-10-CM | POA: Diagnosis not present

## 2019-03-31 DIAGNOSIS — Z9861 Coronary angioplasty status: Secondary | ICD-10-CM

## 2019-03-31 DIAGNOSIS — I5023 Acute on chronic systolic (congestive) heart failure: Secondary | ICD-10-CM | POA: Diagnosis not present

## 2019-03-31 DIAGNOSIS — Z95810 Presence of automatic (implantable) cardiac defibrillator: Secondary | ICD-10-CM

## 2019-03-31 DIAGNOSIS — IMO0001 Reserved for inherently not codable concepts without codable children: Secondary | ICD-10-CM

## 2019-03-31 DIAGNOSIS — E119 Type 2 diabetes mellitus without complications: Secondary | ICD-10-CM

## 2019-03-31 MED ORDER — FUROSEMIDE 20 MG PO TABS: ORAL_TABLET | ORAL | 0 refills | Status: DC

## 2019-03-31 MED ORDER — POTASSIUM CHLORIDE CRYS ER 20 MEQ PO TBCR: EXTENDED_RELEASE_TABLET | ORAL | 0 refills | Status: DC

## 2019-03-31 MED ORDER — POTASSIUM CHLORIDE CRYS ER 10 MEQ PO TBCR: EXTENDED_RELEASE_TABLET | ORAL | 0 refills | Status: DC

## 2019-03-31 NOTE — Assessment & Plan Note (Signed)
OM1 PCI 2014 May 2019- CTO RCA with l-R collaterals, 90% CFX-DES, patent OM1 stent May 2020- patent stents

## 2019-03-31 NOTE — Assessment & Plan Note (Signed)
MDT placed Dec 2019- Dr Curt Bears follows

## 2019-03-31 NOTE — Assessment & Plan Note (Signed)
Admitted May 2020- patent stents, EF 20-25%, Amiodarone added with good results.

## 2019-03-31 NOTE — Patient Instructions (Addendum)
Medication Instructions:  START Lasix 40mg  Take 1 tablet daily for 3 days then thake Lasix 20mg  once a day START Potassium 10mg  Take 1 tablet daily for 3 days then STOP If you need a refill on your cardiac medications before your next appointment, please call your pharmacy.   Lab work: Your physician recommends that you return for lab work in: TODAY-BMET, CBC, BNP If you have labs (blood work) drawn today and your tests are completely normal, you will receive your results only by: Marland Kitchen MyChart Message (if you have MyChart) OR . A paper copy in the mail If you have any lab test that is abnormal or we need to change your treatment, we will call you to review the results.  Testing/Procedures: NONE   Follow-Up: At [facility], you and your health needs are our priority.  As part of our continuing mission to provide you with exceptional heart care, we have created designated Provider Care Teams.  These Care Teams include your primary Cardiologist (physician) and Advanced Practice Providers (APPs -  Physician Assistants and Nurse Practitioners) who all work together to provide you with the care you need, when you need it.  . Your physician recommends that you schedule a follow-up appointment in: 2-3 [place], PA-C  Any Other Special Instructions Will Be Listed Below (If Applicable).

## 2019-03-31 NOTE — Assessment & Plan Note (Signed)
EF 20-25% May 2020 echo

## 2019-03-31 NOTE — Progress Notes (Signed)
Cardiology Office Note:    Date:  03/31/2019   ID:  [name], DOB Jun 04, 1956, MRN [id number]  PCP:  Hal MoralesGunter, Tara G, NP  Cardiologist:  Rollene RotundaJames Hochrein, MD  Electrophysiologist:  Regan LemmingWill Martin Camnitz, MD   Referring MD: Hal MoralesGunter, Tara G, NP   Chief Complaint  Patient presents with  . Follow-up    shortness of breath    History of Present Illness:    [name] is a 63 y.o. male with a hx of CAD, status post remote OM1 PCI in 2012.  In May 2019 he had a catheterization that revealed a CTO of his RCA with left-to-right collaterals and a 90% circumflex lesion that was dilated and treated with DES. The patient had a cardiomyopathy at that time with an ejection fraction of 20 to 25%.  On medical therapy this improved to 30%.  In December 2019 he had a Medtronic ICD placed.    In May 2020 he was admitted with ventricular tachycardia and amiodarone was added. Cath done Jan 06 2019 showed the previously placed CFX and OM stents were patent.  EFwas unchanged-20-25%.  The patient saw Dr. Elberta Fortisamnitz January 27, 2019 and his amiodarone was cut back to 200 mg daily.  Patient is seen in the office today as an add-on for shortness of breath.  He says he has dyspnea with exertion and also admits to some shortness of breath when he lays down.  The patient initially requested that his amiodarone be increased to 400 mg a day since he felt so much better after this was started in May.  On exam he has basilar rales.  He does not have lower extremity edema but his weight is up 13 pounds from his May 29th weight.    Past Medical History:  Diagnosis Date  . ASCVD (arteriosclerotic cardiovascular disease)    a. MI 2004, did not seek care at that time. b. s/p PTCA/BMS to OM1 04/2011 (known totally occluded RCA with L-R collaterals and failed angioplasty on that vessel). c.)  Cath 2019 chronically occluded right coronary artery with collateralization.  90% circumflex stenosis treated with a DES.  Marland Kitchen. Chronic  bronchitis (HCC)   . HTN (hypertension)   . Hyperlipidemia   . Ischemic cardiomyopathy    EF 30%.    . OSA (obstructive sleep apnea) 05/24/11  . Restless leg syndrome 06/25/2011  . Type 2 diabetes mellitus (HCC)     Past Surgical History:  Procedure Laterality Date  . CARDIAC CATHETERIZATION  11/24/2012   40-50% ostial LAD, 40% mid LAD, 60-70% ostial D1, 40% proximal LCx, patent OM branch stent, 99% serial proximal-mid RCA lesions, 100% mid RCA occlusion (chronic with left to right collateralization); EF 40%, inferior wall hypokinesis.  Marland Kitchen. CARDIAC CATHETERIZATION N/A 05/31/2015   Procedure: Left Heart Cath and Coronary Angiography;  Surgeon: Kathleene Hazelhristopher D McAlhany, MD;  [facility];  Service: Cardiovascular;  Laterality: N/A;  . CORONARY STENT INTERVENTION N/A 12/25/2017   Procedure: CORONARY STENT INTERVENTION;  Surgeon: SwazilandJordan, Peter M, MD;  [facility];  Service: Cardiovascular;  Laterality: N/A;  . CORONARY STENT PLACEMENT  04/2011  . CYSTECTOMY     Spine and jaw  . ICD IMPLANT N/A 07/19/2018   Procedure: ICD IMPLANT;  Surgeon: Regan Lemmingamnitz, Will Martin, MD;  [facility];  Service: Cardiovascular;  Laterality: N/A;  . LEFT HEART CATH AND CORONARY ANGIOGRAPHY N/A 12/25/2017   Procedure: LEFT HEART CATH AND CORONARY ANGIOGRAPHY;  Surgeon:  SwazilandJordan, Peter M, MD;  [facility];  Service: Cardiovascular;  Laterality: N/A;  . LEFT HEART CATHETERIZATION WITH CORONARY ANGIOGRAM N/A 11/24/2012   Procedure: LEFT HEART CATHETERIZATION WITH CORONARY ANGIOGRAM;  Surgeon: Kathleene Hazelhristopher D McAlhany, MD;  [facility];  Service: Cardiovascular;  Laterality: N/A;  . lesion removed from foot     right foot  . RIGHT/LEFT HEART CATH AND CORONARY ANGIOGRAPHY N/A 01/06/2019   Procedure: RIGHT/LEFT HEART CATH AND CORONARY ANGIOGRAPHY;  Surgeon: Runell GessBerry, Jonathan J, MD;  [facility];  Service: Cardiovascular;  Laterality: N/A;  .  TONSILLECTOMY AND ADENOIDECTOMY      Current Medications: Current Meds  Medication Sig  . acetaminophen (TYLENOL) 500 MG tablet Take 500 mg by mouth every 6 (six) hours as needed (for headaches.).  Marland Kitchen. albuterol (PROVENTIL HFA;VENTOLIN HFA) 108 (90 Base) MCG/ACT inhaler Inhale 1-2 puffs into the lungs every 6 (six) hours as needed for wheezing or shortness of breath.  Marland Kitchen. amiodarone (PACERONE) 200 MG tablet Take 1 tablet (200 mg total) by mouth daily.  Marland Kitchen. aspirin 81 MG chewable tablet Chew 1 tablet (81 mg total) by mouth daily.  . carvedilol (COREG) 3.125 MG tablet Take 1 tablet (3.125 mg total) by mouth 2 (two) times daily with a meal.  . CINNAMON PO Take 1,000 mg by mouth daily.  . clopidogrel (PLAVIX) 75 MG tablet Take 1 tablet (75 mg total) by mouth daily.  . Coenzyme Q10 (CO Q 10) 100 MG CAPS Take 100 mg by mouth daily.  Marland Kitchen. JARDIANCE 10 MG TABS tablet Take 10 mg by mouth daily.  . magnesium oxide (MAG-OX) 400 MG tablet Take 400 mg by mouth 2 (two) times daily.  . metFORMIN (GLUCOPHAGE) 1000 MG tablet Take 1,000 mg by mouth 2 (two) times daily.  . nitroGLYCERIN (NITROSTAT) 0.4 MG SL tablet Place 1 tablet (0.4 mg total) under the tongue every 5 (five) minutes as needed for chest pain.  . pantoprazole (PROTONIX) 40 MG tablet Take 1 tablet (40 mg total) by mouth every evening. (Patient taking differently: Take 40 mg by mouth daily. )  . pramipexole (MIRAPEX) 1 MG tablet Take 1 mg by mouth 2 (two) times a day.   . sacubitril-valsartan (ENTRESTO) 24-26 MG Take 1 tablet by mouth 2 (two) times daily.  Marland Kitchen. spironolactone (ALDACTONE) 25 MG tablet Take 0.5 tablets (12.5 mg total) by mouth daily.  [name]. TOUJEO SOLOSTAR 300 UNIT/ML SOPN Inject 20 Units into the skin daily.     Allergies:   Brilinta [ticagrelor], Codeine, Lipitor [atorvastatin calcium], and Niaspan [niacin er]   Social History   Socioeconomic History  . Marital status: Married    Spouse name: Not on file  . Number of children: 1  . Years  of education: Not on file  . Highest education level: Not on file  Occupational History  . Occupation: Wellsite geologistroject [employer]   Social Needs  . Financial resource strain: Not on file  . Food insecurity    Worry: Not on file    Inability: Not on file  . Transportation needs    Medical: Not on file    Non-medical: Not on file  Tobacco Use  . Smoking status: Former Smoker    Packs/day: 1.50    Years: 35.00    Pack years: 52.50    Types: Cigarettes    Quit date: 08/18/2005    Years since quitting: 13.6  . Smokeless tobacco: Never Used  Substance and Sexual Activity  . Alcohol  use: No  . Drug use: No  . Sexual activity: Not Currently  Lifestyle  . Physical activity    Days per week: Not on file    Minutes per session: Not on file  . Stress: Not on file  Relationships  . Social Herbalist on phone: Not on file    Gets together: Not on file    Attends religious service: Not on file    Active member of club or organization: Not on file    Attends meetings of clubs or organizations: Not on file    Relationship status: Not on file  Other Topics Concern  . Not on file  Social History Narrative   Married with 1 grown daughter who is a Network engineer for a school           Family History: The patient's family history includes Heart attack (age of onset: 66) in his brother; Heart attack (age of onset: 74) in his father; Lymphoma in his mother.  ROS:   Please see the history of present illness.  No GI bleeding or melana    All other systems reviewed and are negative.  EKGs/Labs/Other Studies Reviewed:    The following studies were reviewed today: Cath 01/06/2019  EKG:  EKG is ordered today.  The ekg ordered today demonstrates NSR- HR 57, LAD, inferior Qs, QTc 459  Recent Labs: 01/04/2019: B Natriuretic Peptide 187.1; TSH 2.742 01/08/2019: ALT 36; BUN 21; Creatinine, Ser 1.20; Hemoglobin 16.6; Magnesium 2.3; Platelets 126; Potassium 4.4; Sodium 135  Recent Lipid Panel     Component Value Date/Time   CHOL 188 01/05/2019 0403   TRIG 336 (H) 01/05/2019 0403   HDL 33 (L) 01/05/2019 0403   CHOLHDL 5.7 01/05/2019 0403   VLDL 67 (H) 01/05/2019 0403   LDLCALC 88 01/05/2019 0403    Physical Exam:    VS:  BP 100/68   Pulse (!) 57   Temp (!) 97.3 F (36.3 C)   Ht 5\' 10"  (1.778 m)   Wt 218 lb (98.9 kg)   SpO2 90%   BMI 31.28 kg/m     Wt Readings from Last 3 Encounters:  03/31/19 218 lb (98.9 kg)  01/14/19 205 lb (93 kg)  01/10/19 205 lb 14.4 oz (93.4 kg)     GEN: Overweight male, well developed in no acute distress HEENT: Normal NECK: No JVD; No carotid bruits LYMPHATICS: No lymphadenopathy CARDIAC: RRR, no murmurs, rubs, gallops RESPIRATORY:  Bi-basilar velcro crackles ABDOMEN: Obese, Soft, non-tender, non-distended MUSCULOSKELETAL:  No edema; No deformity  SKIN: Warm and dry NEUROLOGIC:  Alert and oriented x 3 PSYCHIATRIC:  Normal affect   ASSESSMENT:    Acute on chronic systolic CHF (congestive heart failure) (HCC) Pt seen in the office today with increasing DOE and increasing weight with bilateral rales on exam.  Cardiomyopathy, ischemic EF 20-25% May 2020 echo  CAD S/P percutaneous coronary angioplasty OM1 PCI 2014 May 2019- CTO RCA with l-R collaterals, 90% CFX-DES, patent OM1 stent May 2020- patent stents  VT (ventricular tachycardia) [facility]) Admitted May 2020- patent stents, EF 20-25%, Amiodarone added with good results.  ICD (implantable cardioverter-defibrillator) in place MDT placed Dec 2019- Dr Curt Bears follows  Insulin dependent diabetes mellitus ([place]) .  PLAN:    I suggested we add Lasix 40 mg daily x 3 days as well as K+ dur 10 meq x 3 days, then Lasix 20 mg daily.  F/U in 2 weeks.   Medication Adjustments/Labs and Tests Ordered: Current  medicines are reviewed at length with the patient today.  Concerns regarding medicines are outlined above.  Orders Placed This Encounter  Procedures  . CBC with Differential  .  Basic Metabolic Panel (BMET)  . Pro b natriuretic peptide (BNP)[facility])  . EKG 12-Lead   Meds ordered this encounter  Medications  . furosemide (LASIX) 20 MG tablet    Sig: TAKE 2 TABLETS DAILY FOR 3 DAYS THEN QD    Dispense:  30 tablet    Refill:  0  . DISCONTD: potassium chloride SA (K-DUR) 20 MEQ tablet    Sig: TAKE 1 TABLET DAILY FOR 3 DAYS THEN STOP    Dispense:  10 tablet    Refill:  0  . potassium chloride SA (K-DUR) 10 MEQ tablet    Sig: TAKE 1 TABLET DAILY FOR 3 DAYS THEN STOP    Dispense:  10 tablet    Refill:  0    DISCONITUE PREVIOUS RX THIS IS CORRECT DOSE    Patient Instructions  Medication Instructions:  START Lasix 40mg  Take 1 tablet daily for 3 days then thake Lasix 20mg  once a day START Potassium 10mg  Take 1 tablet daily for 3 days then STOP If you need a refill on your cardiac medications before your next appointment, please call your pharmacy.   Lab work: Your physician recommends that you return for lab work in: TODAY-BMET, CBC, BNP If you have labs (blood work) drawn today and your tests are completely normal, you will receive your results only by: Marland Kitchen MyChart Message (if you have MyChart) OR . A paper copy in the mail If you have any lab test that is abnormal or we need to change your treatment, we will call you to review the results.  Testing/Procedures: NONE   Follow-Up: At [facility], you and your health needs are our priority.  As part of our continuing mission to provide you with exceptional heart care, we have created designated Provider Care Teams.  These Care Teams include your primary Cardiologist (physician) and Advanced Practice Providers (APPs -  Physician Assistants and Nurse Practitioners) who all work together to provide you with the care you need, when you need it.  . Your physician recommends that you schedule a follow-up appointment in: 2-3 WEEKS WITH Corine Shelter, PA-C  Any Other Special Instructions Will  Be Listed Below (If Applicable).      Jolene Provost, PA-C  03/31/2019 4:07 PM    [facility]

## 2019-03-31 NOTE — Assessment & Plan Note (Signed)
Pt seen in the office today with increasing DOE and increasing weight with bilateral rales on exam.

## 2019-04-01 ENCOUNTER — Telehealth: Payer: Self-pay | Admitting: Cardiology

## 2019-04-01 LAB — CBC WITH DIFFERENTIAL/PLATELET
Basophils Absolute: 0.1 10*3/uL (ref 0.0–0.2)
Basos: 1 %
EOS (ABSOLUTE): 0.3 10*3/uL (ref 0.0–0.4)
Eos: 3 %
Hematocrit: 47 % (ref 37.5–51.0)
Hemoglobin: 16.5 g/dL (ref 13.0–17.7)
Immature Grans (Abs): 0 10*3/uL (ref 0.0–0.1)
Immature Granulocytes: 0 %
Lymphocytes Absolute: 3.5 10*3/uL — ABNORMAL HIGH (ref 0.7–3.1)
Lymphs: 39 %
MCH: 32.6 pg (ref 26.6–33.0)
MCHC: 35.1 g/dL (ref 31.5–35.7)
MCV: 93 fL (ref 79–97)
Monocytes Absolute: 0.8 10*3/uL (ref 0.1–0.9)
Monocytes: 9 %
Neutrophils Absolute: 4.4 10*3/uL (ref 1.4–7.0)
Neutrophils: 48 %
Platelets: 146 10*3/uL — ABNORMAL LOW (ref 150–450)
RBC: 5.06 x10E6/uL (ref 4.14–5.80)
RDW: 13.6 % (ref 11.6–15.4)
WBC: 9.1 10*3/uL (ref 3.4–10.8)

## 2019-04-01 LAB — BASIC METABOLIC PANEL
BUN/Creatinine Ratio: 15 (ref 10–24)
BUN: 18 mg/dL (ref 8–27)
CO2: 20 mmol/L (ref 20–29)
Calcium: 9.4 mg/dL (ref 8.6–10.2)
Chloride: 102 mmol/L (ref 96–106)
Creatinine, Ser: 1.17 mg/dL (ref 0.76–1.27)
GFR calc Af Amer: 76 mL/min/{1.73_m2} (ref >59)
GFR calc non Af Amer: 66 mL/min/{1.73_m2} (ref >59)
Glucose: 114 mg/dL — ABNORMAL HIGH (ref 65–99)
Potassium: 4.3 mmol/L (ref 3.5–5.2)
Sodium: 140 mmol/L (ref 134–144)

## 2019-04-01 LAB — PRO B NATRIURETIC PEPTIDE: NT-Pro BNP: 55 pg/mL (ref 0–210)

## 2019-04-01 NOTE — Telephone Encounter (Signed)
New Message:   Please call, does pt need to be on Furosemide and  Spironolactone? The pharmacist told her to check and see if pt should be taking both.

## 2019-04-01 NOTE — Telephone Encounter (Signed)
Discussed with Dr Percival Spanish and he does want patient to take both as instructed by [facility]. Advised wife, verbalized understanding

## 2019-04-01 NOTE — Telephone Encounter (Signed)
Spoke to patient's wife.She stated husband saw Kerin Ransom PA yesterday.Stated Lasix 40 mg daily for 3 days then 20 mg daily was added.She wanted to ask if husband needs to continue Spironolactone 25 mg 1/2 tablet daily.Advised I will send message to Dr.Hochrein for advice.

## 2019-04-14 ENCOUNTER — Ambulatory Visit: Payer: PRIVATE HEALTH INSURANCE | Admitting: Cardiology

## 2019-04-14 ENCOUNTER — Telehealth: Payer: Self-pay

## 2019-04-14 ENCOUNTER — Other Ambulatory Visit: Payer: Self-pay

## 2019-04-14 VITALS — BP 116/74 | HR 62 | Temp 97.0°F | Ht 70.0 in | Wt 217.0 lb

## 2019-04-14 DIAGNOSIS — Z95810 Presence of automatic (implantable) cardiac defibrillator: Secondary | ICD-10-CM | POA: Diagnosis not present

## 2019-04-14 DIAGNOSIS — I2510 Atherosclerotic heart disease of native coronary artery without angina pectoris: Secondary | ICD-10-CM | POA: Diagnosis not present

## 2019-04-14 DIAGNOSIS — E119 Type 2 diabetes mellitus without complications: Secondary | ICD-10-CM

## 2019-04-14 DIAGNOSIS — Z9861 Coronary angioplasty status: Secondary | ICD-10-CM

## 2019-04-14 DIAGNOSIS — Z794 Long term (current) use of insulin: Secondary | ICD-10-CM

## 2019-04-14 DIAGNOSIS — I255 Ischemic cardiomyopathy: Secondary | ICD-10-CM | POA: Diagnosis not present

## 2019-04-14 DIAGNOSIS — I472 Ventricular tachycardia, unspecified: Secondary | ICD-10-CM

## 2019-04-14 DIAGNOSIS — I5023 Acute on chronic systolic (congestive) heart failure: Secondary | ICD-10-CM | POA: Diagnosis not present

## 2019-04-14 DIAGNOSIS — IMO0001 Reserved for inherently not codable concepts without codable children: Secondary | ICD-10-CM

## 2019-04-14 MED ORDER — ENTRESTO 49-51 MG PO TABS: 1.0000 | ORAL_TABLET | Freq: Two times a day (BID) | ORAL | 3 refills | Status: DC

## 2019-04-14 NOTE — Telephone Encounter (Signed)
Left message on patients voicemail informing patient that his lab slips will be mailed to him. Advised to him to contact the office with any questions or concerns.

## 2019-04-14 NOTE — Progress Notes (Signed)
Cardiology Office Note:    Date:  04/14/2019   ID:  [name], DOB 01-10-1956, MRN [id number]  PCP:  Hal MoralesGunter, Tara G, NP  Cardiologist:  Rollene RotundaJames Hochrein, MD  Electrophysiologist:  Regan LemmingWill Martin Camnitz, MD   Referring MD: Hal MoralesGunter, Tara G, NP   Chief Complaint  Patient presents with  . Follow-up    History of Present Illness:    [name] is a 63 y.o. male with a hx of CAD and ICM.  He had an OM1 PCI in 2012.  In May 2019 he had a 90% CFX treated with DES.  The RCA was occluded with left ot right collaterals.  His EF then was 20-25% (down from 50-55% by echo in 2014).  In Dec 2019 he had an ICD placed.   He was admitted in May 2020 with VT.  Amiodarone was added. Cath done Jan 06 2019 showed the previously placed CFX and OM stents were patent.  EFwas unchanged-20-25%.  The patient saw Dr. Elberta Fortisamnitz January 27, 2019 and his amiodarone was cut back to 200 mg daily. The patient tells me he felt great when he left the hospital and then gradually declined with increasing DOE.    I saw him in the office 03/31/2019.  His weight was up to 218 lbs (he was 205 lbs at discharge in May) and he was having orthopnea. Interestingly his pro BNP was only 55.  I added Lasix 40 mg QD x 3 and then 20 mg daily.    He returns for follow up.  His orthopnea has resolved and he is less SOB but he still says he is fatigued and does not feel as well as he did when he left the hospital in May.  He thinks he would feel better on a higher dose of Amiodarone but I explained that would not be the case.  I suspect he is still a little volume overloaded and has low output symptoms as well.   Past Medical History:  Diagnosis Date  . ASCVD (arteriosclerotic cardiovascular disease)    a. MI 2004, did not seek care at that time. b. s/p PTCA/BMS to OM1 04/2011 (known totally occluded RCA with L-R collaterals and failed angioplasty on that vessel). c.)  Cath 2019 chronically occluded right coronary artery with  collateralization.  90% circumflex stenosis treated with a DES.  Marland Kitchen. Chronic bronchitis (HCC)   . HTN (hypertension)   . Hyperlipidemia   . Ischemic cardiomyopathy    EF 30%.    . OSA (obstructive sleep apnea) 05/24/11  . Restless leg syndrome 06/25/2011  . Type 2 diabetes mellitus (HCC)     Past Surgical History:  Procedure Laterality Date  . CARDIAC CATHETERIZATION  11/24/2012   40-50% ostial LAD, 40% mid LAD, 60-70% ostial D1, 40% proximal LCx, patent OM branch stent, 99% serial proximal-mid RCA lesions, 100% mid RCA occlusion (chronic with left to right collateralization); EF 40%, inferior wall hypokinesis.  Marland Kitchen. CARDIAC CATHETERIZATION N/A 05/31/2015   Procedure: Left Heart Cath and Coronary Angiography;  Surgeon: Kathleene Hazelhristopher D McAlhany, MD;  [facility];  Service: Cardiovascular;  Laterality: N/A;  . CORONARY STENT INTERVENTION N/A 12/25/2017   Procedure: CORONARY STENT INTERVENTION;  Surgeon: SwazilandJordan, Peter M, MD;  [facility];  Service: Cardiovascular;  Laterality: N/A;  . CORONARY STENT PLACEMENT  04/2011  . CYSTECTOMY     Spine and jaw  . ICD IMPLANT N/A 07/19/2018   Procedure: ICD IMPLANT;  Surgeon: Elberta Fortisamnitz,  Andree CossWill Martin, MD;  [facility];  Service: Cardiovascular;  Laterality: N/A;  . LEFT HEART CATH AND CORONARY ANGIOGRAPHY N/A 12/25/2017   Procedure: LEFT HEART CATH AND CORONARY ANGIOGRAPHY;  Surgeon: SwazilandJordan, Peter M, MD;  [facility];  Service: Cardiovascular;  Laterality: N/A;  . LEFT HEART CATHETERIZATION WITH CORONARY ANGIOGRAM N/A 11/24/2012   Procedure: LEFT HEART CATHETERIZATION WITH CORONARY ANGIOGRAM;  Surgeon: Kathleene Hazelhristopher D McAlhany, MD;  [facility];  Service: Cardiovascular;  Laterality: N/A;  . lesion removed from foot     right foot  . RIGHT/LEFT HEART CATH AND CORONARY ANGIOGRAPHY N/A 01/06/2019   Procedure: RIGHT/LEFT HEART CATH AND CORONARY ANGIOGRAPHY;  Surgeon: Runell GessBerry, Jonathan J, MD;   [facility];  Service: Cardiovascular;  Laterality: N/A;  . TONSILLECTOMY AND ADENOIDECTOMY      Current Medications: Current Meds  Medication Sig  . acetaminophen (TYLENOL) 500 MG tablet Take 500 mg by mouth every 6 (six) hours as needed (for headaches.).  Marland Kitchen. albuterol (PROVENTIL HFA;VENTOLIN HFA) 108 (90 Base) MCG/ACT inhaler Inhale 1-2 puffs into the lungs every 6 (six) hours as needed for wheezing or shortness of breath.  Marland Kitchen. amiodarone (PACERONE) 200 MG tablet Take 1 tablet (200 mg total) by mouth daily.  Marland Kitchen. aspirin 81 MG chewable tablet Chew 1 tablet (81 mg total) by mouth daily.  . carvedilol (COREG) 3.125 MG tablet Take 1 tablet (3.125 mg total) by mouth 2 (two) times daily with a meal.  . CINNAMON PO Take 1,000 mg by mouth daily.  . clopidogrel (PLAVIX) 75 MG tablet Take 1 tablet (75 mg total) by mouth daily.  . Coenzyme Q10 (CO Q 10) 100 MG CAPS Take 100 mg by mouth daily.  . furosemide (LASIX) 20 MG tablet TAKE 2 TABLETS DAILY FOR 3 DAYS THEN QD  . JARDIANCE 10 MG TABS tablet Take 10 mg by mouth daily.  . magnesium oxide (MAG-OX) 400 MG tablet Take 400 mg by mouth 2 (two) times daily.  . metFORMIN (GLUCOPHAGE) 1000 MG tablet Take 1,000 mg by mouth 2 (two) times daily.  . nitroGLYCERIN (NITROSTAT) 0.4 MG SL tablet Place 1 tablet (0.4 mg total) under the tongue every 5 (five) minutes as needed for chest pain.  . pantoprazole (PROTONIX) 40 MG tablet Take 1 tablet (40 mg total) by mouth every evening. (Patient taking differently: Take 40 mg by mouth daily. )  . pramipexole (MIRAPEX) 1 MG tablet Take 1 mg by mouth 2 (two) times a day.   . sacubitril-valsartan (ENTRESTO) 24-26 MG Take 1 tablet by mouth 2 (two) times daily.  Marland Kitchen. spironolactone (ALDACTONE) 25 MG tablet Take 0.5 tablets (12.5 mg total) by mouth daily.  [name]. TOUJEO SOLOSTAR 300 UNIT/ML SOPN Inject 20 Units into the skin daily.     Allergies:   Brilinta [ticagrelor], Codeine, Lipitor [atorvastatin calcium], and  Niaspan [niacin er]   Social History   Socioeconomic History  . Marital status: Married    Spouse name: Not on file  . Number of children: 1  . Years of education: Not on file  . Highest education level: Not on file  Occupational History  . Occupation: Wellsite geologistroject [employer]   Social Needs  . Financial resource strain: Not on file  . Food insecurity    Worry: Not on file    Inability: Not on file  . Transportation needs    Medical: Not on file    Non-medical: Not on file  Tobacco Use  .  Smoking status: Former Smoker    Packs/day: 1.50    Years: 35.00    Pack years: 52.50    Types: Cigarettes    Quit date: 08/18/2005    Years since quitting: 13.6  . Smokeless tobacco: Never Used  Substance and Sexual Activity  . Alcohol use: No  . Drug use: No  . Sexual activity: Not Currently  Lifestyle  . Physical activity    Days per week: Not on file    Minutes per session: Not on file  . Stress: Not on file  Relationships  . Social Musician on phone: Not on file    Gets together: Not on file    Attends religious service: Not on file    Active member of club or organization: Not on file    Attends meetings of clubs or organizations: Not on file    Relationship status: Not on file  Other Topics Concern  . Not on file  Social History Narrative   Married with 1 grown daughter who is a Diplomatic Services operational officer for a school           Family History: The patient's family history includes Heart attack (age of onset: 31) in his brother; Heart attack (age of onset: 38) in his father; Lymphoma in his mother.  ROS:   Please see the history of present illness.     All other systems reviewed and are negative.  EKGs/Labs/Other Studies Reviewed:     Recent Labs: 01/04/2019: B Natriuretic Peptide 187.1; TSH 2.742 01/08/2019: ALT 36; Magnesium 2.3 03/31/2019: BUN 18; Creatinine, Ser 1.17; Hemoglobin 16.5; NT-Pro BNP 55; Platelets 146; Potassium 4.3; Sodium 140  Recent Lipid Panel    Component  Value Date/Time   CHOL 188 01/05/2019 0403   TRIG 336 (H) 01/05/2019 0403   HDL 33 (L) 01/05/2019 0403   CHOLHDL 5.7 01/05/2019 0403   VLDL 67 (H) 01/05/2019 0403   LDLCALC 88 01/05/2019 0403    Physical Exam:    VS:  BP 124/74   Pulse 62   Temp (!) 97 F (36.1 C) (Temporal)   Ht 5\' 10"  (1.778 m)   Wt 217 lb (98.4 kg)   SpO2 94%   BMI 31.14 kg/m     Wt Readings from Last 3 Encounters:  04/14/19 217 lb (98.4 kg)  03/31/19 218 lb (98.9 kg)  01/14/19 205 lb (93 kg)     GEN: Overweight male, well developed in no acute distress HEENT: Normal NECK: No JVD; No carotid bruits LYMPHATICS: No lymphadenopathy CARDIAC: RRR, no murmurs, rubs, gallops RESPIRATORY:  Bi basilar rales ABDOMEN: Soft, non-tender, non-distended MUSCULOSKELETAL:  No edema; No deformity  SKIN: Warm and dry NEUROLOGIC:  Alert and oriented x 3 PSYCHIATRIC:  Normal affect   ASSESSMENT:    Acute on chronic systolic CHF (congestive heart failure) (HCC) Some improvement with the addition of Lasix. I will increase Entresto and check a BMP in two weeks.   Cardiomyopathy, ischemic EF 20-25% May 2020 echo  CAD S/P percutaneous coronary angioplasty OM1 PCI 2014 May 2019- CTO RCA with l-R collaterals, 90% CFX-DES, patent OM1 stent May 2020- patent stents  VT (ventricular tachycardia) [facility]) Admitted May 2020- patent stents, EF 20-25%, Amiodarone added with good results.  ICD (implantable cardioverter-defibrillator) in place MDT placed Dec 2019- Dr Elberta Fortis follows  Insulin dependent diabetes mellitus (HCC)  PLAN:      Increase Entresto to 49/57.  BMP two weeks, keep f/u with Dr Antoine Poche in 4 weeks.  Medication Adjustments/Labs and Tests Ordered: Current medicines are reviewed at length with the patient today.  Concerns regarding medicines are outlined above.  No orders of the defined types were placed in this encounter.  No orders of the defined types were placed in this encounter.   There  are no Patient Instructions on file for this visit.   Signed, Kerin Ransom, PA-C  04/14/2019 9:22 AM    [place]

## 2019-04-14 NOTE — Patient Instructions (Addendum)
Medication Instructions:  INCREASE Entresto to 49/51mg  take 1 tablet twice a day  If you need a refill on your cardiac medications before your next appointment, please call your pharmacy.   Lab work: Your physician recommends that you return for lab work in: 2 weeks BMET(04/28/2019) If you have labs (blood work) drawn today and your tests are completely normal, you will receive your results only by: Marland Kitchen MyChart Message (if you have MyChart) OR . A paper copy in the mail If you have any lab test that is abnormal or we need to change your treatment, we will call you to review the results.  Testing/Procedures: NONE   Follow-Up: At [facility], you and your health needs are our priority.  As part of our continuing mission to provide you with exceptional heart care, we have created designated Provider Care Teams.  These Care Teams include your primary Cardiologist (physician) and Advanced Practice Providers (APPs -  Physician Assistants and Nurse Practitioners) who all work together to provide you with the care you need, when you need it. . FOLLOW UP WITH DR HOCHREIN AS SCHEDULED   Any Other Special Instructions Will Be Listed Below (If Applicable).

## 2019-04-26 ENCOUNTER — Ambulatory Visit (INDEPENDENT_AMBULATORY_CARE_PROVIDER_SITE_OTHER): Payer: PRIVATE HEALTH INSURANCE | Admitting: *Deleted

## 2019-04-26 DIAGNOSIS — I255 Ischemic cardiomyopathy: Secondary | ICD-10-CM

## 2019-04-26 LAB — CUP PACEART REMOTE DEVICE CHECK
Battery Remaining Longevity: 131 mo
Battery Voltage: 3.01 V
Brady Statistic RV Percent Paced: 0.18 %
Date Time Interrogation Session: 20200908084224
HighPow Impedance: 79 Ohm
Implantable Lead Implant Date: 20191202
Implantable Lead Location: 753860
Implantable Pulse Generator Implant Date: 20191202
Lead Channel Impedance Value: 323 Ohm
Lead Channel Impedance Value: 380 Ohm
Lead Channel Pacing Threshold Amplitude: 0.75 V
Lead Channel Pacing Threshold Pulse Width: 0.4 ms
Lead Channel Sensing Intrinsic Amplitude: 2.375 mV
Lead Channel Sensing Intrinsic Amplitude: 2.375 mV
Lead Channel Setting Pacing Amplitude: 2.5 V
Lead Channel Setting Pacing Pulse Width: 0.4 ms
Lead Channel Setting Sensing Sensitivity: 0.3 mV

## 2019-05-04 LAB — BASIC METABOLIC PANEL
BUN/Creatinine Ratio: 14 (ref 10–24)
BUN: 15 mg/dL (ref 8–27)
CO2: 21 mmol/L (ref 20–29)
Calcium: 9.6 mg/dL (ref 8.6–10.2)
Chloride: 98 mmol/L (ref 96–106)
Creatinine, Ser: 1.08 mg/dL (ref 0.76–1.27)
GFR calc Af Amer: 83 mL/min/{1.73_m2} (ref >59)
GFR calc non Af Amer: 72 mL/min/{1.73_m2} (ref >59)
Glucose: 131 mg/dL — ABNORMAL HIGH (ref 65–99)
Potassium: 4.5 mmol/L (ref 3.5–5.2)
Sodium: 137 mmol/L (ref 134–144)

## 2019-05-12 ENCOUNTER — Encounter: Payer: Self-pay | Admitting: Cardiology

## 2019-05-12 NOTE — Progress Notes (Signed)
Remote ICD transmission.   

## 2019-05-16 NOTE — Progress Notes (Signed)
Cardiology Office Note   Date:  05/17/2019   ID:  Ace, Bergfeld March 31, 1956, MRN [id number]  PCP:  Charlynn Court, NP  Cardiologist:   Minus Breeding, MD   Chief Complaint  Patient presents with  . Cardiomyopathy      History of Present Illness: [name] is a 63 y.o. male who presents for  Evaluation of CAD.  He had an OM1 PCI in 2012.  In May 2019 he had a 90% CFX treated with DES.  The RCA was occluded with left ot right collaterals.  His EF then was 20-25% (down from 50-55% by echo in 2014).  In Dec 2019 he had an ICD placed.   He was admitted in May 2020 with VT.  Amiodarone was added.Cath done Jan 06 2019 showed the previously placed CFX and OM stents were patent. EFwas unchanged-20-25%. The patient saw Dr. Curt Bears January 27, 2019 and his amiodarone was cut back to 200 mg daily.  He had increased DOE.  He has had his Lasix and Entresto increased recently.    Since I last saw him he is fatigued.  He does not sleep well.  He does say that his breathing is much better than it was prior to his initial presentation earlier this year.  He is also not having a profound weakness he had with his VT.  He is not having any new chest discomfort, neck or arm discomfort.  He is not noticing any palpitations, presyncope or syncope.  He said no weight gain or edema.  He is going to go ocean fishing.   Past Medical History:  Diagnosis Date  . ASCVD (arteriosclerotic cardiovascular disease)    a. MI 2004, did not seek care at that time. b. s/p PTCA/BMS to OM1 04/2011 (known totally occluded RCA with L-R collaterals and failed angioplasty on that vessel). c.)  Cath 2019 chronically occluded right coronary artery with collateralization.  90% circumflex stenosis treated with a DES.  Marland Kitchen Chronic bronchitis ([place])   . HTN (hypertension)   . Hyperlipidemia   . Ischemic cardiomyopathy    EF 30%.    . OSA (obstructive sleep apnea) 05/24/11  . Restless leg syndrome 06/25/2011  . Type 2  diabetes mellitus ([place])     Past Surgical History:  Procedure Laterality Date  . CARDIAC CATHETERIZATION  11/24/2012   40-50% ostial LAD, 40% mid LAD, 60-70% ostial D1, 40% proximal LCx, patent OM branch stent, 99% serial proximal-mid RCA lesions, 100% mid RCA occlusion (chronic with left to right collateralization); EF 40%, inferior wall hypokinesis.  Marland Kitchen CARDIAC CATHETERIZATION N/A 05/31/2015   Procedure: Left Heart Cath and Coronary Angiography;  Surgeon: Burnell Blanks, MD;  Location: [place] CV LAB;  Service: Cardiovascular;  Laterality: N/A;  . CORONARY STENT INTERVENTION N/A 12/25/2017   Procedure: CORONARY STENT INTERVENTION;  Surgeon: Martinique, Peter M, MD;  Location: [place] [facility];  Service: Cardiovascular;  Laterality: N/A;  . CORONARY STENT PLACEMENT  04/2011  . CYSTECTOMY     Spine and jaw  . ICD IMPLANT N/A 07/19/2018   Procedure: ICD IMPLANT;  Surgeon: Constance Haw, MD;  Location: [place] [facility];  Service: Cardiovascular;  Laterality: N/A;  . LEFT HEART CATH AND CORONARY ANGIOGRAPHY N/A 12/25/2017   Procedure: LEFT HEART CATH AND CORONARY ANGIOGRAPHY;  Surgeon: Martinique, Peter M, MD;  Location: [place] [facility];  Service: Cardiovascular;  Laterality: N/A;  . LEFT HEART CATHETERIZATION WITH CORONARY ANGIOGRAM N/A 11/24/2012  Procedure: LEFT HEART CATHETERIZATION WITH CORONARY ANGIOGRAM;  Surgeon: Kathleene Hazel, MD;  Location: [facility] CATH LAB;  Service: Cardiovascular;  Laterality: N/A;  . lesion removed from foot     right foot  . RIGHT/LEFT HEART CATH AND CORONARY ANGIOGRAPHY N/A 01/06/2019   Procedure: RIGHT/LEFT HEART CATH AND CORONARY ANGIOGRAPHY;  Surgeon: Runell Gess, MD;  Location: [facility] INVASIVE [facility] LAB;  Service: Cardiovascular;  Laterality: N/A;  . TONSILLECTOMY AND ADENOIDECTOMY       Current Outpatient Medications  Medication Sig Dispense Refill  . acetaminophen (TYLENOL) 500 MG tablet Take 500 mg by mouth every 6 (six) hours as  needed (for headaches.).    Marland Kitchen albuterol (PROVENTIL HFA;VENTOLIN HFA) 108 (90 Base) MCG/ACT inhaler Inhale 1-2 puffs into the lungs every 6 (six) hours as needed for wheezing or shortness of breath.    Marland Kitchen amiodarone (PACERONE) 200 MG tablet Take 1 tablet (200 mg total) by mouth daily. 90 tablet 2  . aspirin 81 MG chewable tablet Chew 1 tablet (81 mg total) by mouth daily.    . carvedilol (COREG) 3.125 MG tablet Take 1 tablet (3.125 mg total) by mouth 2 (two) times daily with a meal. 180 tablet 1  . CINNAMON PO Take 1,000 mg by mouth daily.    . clopidogrel (PLAVIX) 75 MG tablet Take 1 tablet (75 mg total) by mouth daily. 90 tablet 1  . Coenzyme Q10 (CO Q 10) 100 MG CAPS Take 100 mg by mouth daily.    . furosemide (LASIX) 20 MG tablet TAKE 2 TABLETS DAILY FOR 3 DAYS THEN QD 30 tablet 0  . JARDIANCE 10 MG TABS tablet Take 10 mg by mouth daily.    . magnesium oxide (MAG-OX) 400 MG tablet Take 400 mg by mouth 2 (two) times daily.    . metFORMIN (GLUCOPHAGE) 1000 MG tablet Take 1,000 mg by mouth 2 (two) times daily.    . nitroGLYCERIN (NITROSTAT) 0.4 MG SL tablet Place 1 tablet (0.4 mg total) under the tongue every 5 (five) minutes as needed for chest pain. 30 tablet 1  . pantoprazole (PROTONIX) 40 MG tablet Take 1 tablet (40 mg total) by mouth every evening. (Patient taking differently: Take 40 mg by mouth daily. ) 30 tablet 0  . pramipexole (MIRAPEX) 1 MG tablet Take 1 mg by mouth 2 (two) times a day.     . spironolactone (ALDACTONE) 25 MG tablet Take 0.5 tablets (12.5 mg total) by mouth daily. 45 tablet 1  . TOUJEO SOLOSTAR 300 UNIT/ML SOPN Inject 20 Units into the skin daily.    . sacubitril-valsartan (ENTRESTO) 97-103 MG Take 1 tablet by mouth 2 (two) times daily. 180 tablet 3   No current facility-administered medications for this visit.     Allergies:   Brilinta [ticagrelor], Codeine, Lipitor [atorvastatin calcium], and Niaspan [niacin er]    ROS:  Please see the history of present illness.    Otherwise, review of systems are positive for none.   All other systems are reviewed and negative.    PHYSICAL EXAM: VS:  BP 130/78   Pulse 62   Ht 5\' 10"  (1.778 m)   Wt 216 lb 9.6 oz (98.2 kg)   BMI 31.08 kg/m  , BMI Body mass index is 31.08 kg/m. GENERAL:  Well appearing NECK:  No jugular venous distention, waveform within normal limits, carotid upstroke brisk and symmetric, no bruits, no thyromegaly LUNGS:  Clear to auscultation bilaterally CHEST: Well-healed ICD pocket HEART:  PMI not displaced or  sustained,S1 and S2 within normal limits, no S3, no S4, no clicks, no rubs, no murmurs ABD:  Flat, positive bowel sounds normal in frequency in pitch, no bruits, no rebound, no guarding, no midline pulsatile mass, no hepatomegaly, no splenomegaly EXT:  2 plus pulses throughout, no edema, no cyanosis no clubbing   EKG:  EKG is not ordered today.    Recent Labs: 01/04/2019: B Natriuretic Peptide 187.1; TSH 2.742 01/08/2019: ALT 36; Magnesium 2.3 03/31/2019: Hemoglobin 16.5; NT-Pro BNP 55; Platelets 146 05/04/2019: BUN 15; Creatinine, Ser 1.08; Potassium 4.5; Sodium 137    Lipid Panel    Component Value Date/Time   CHOL 188 01/05/2019 0403   TRIG 336 (H) 01/05/2019 0403   HDL 33 (L) 01/05/2019 0403   CHOLHDL 5.7 01/05/2019 0403   VLDL 67 (H) 01/05/2019 0403   LDLCALC 88 01/05/2019 0403      Wt Readings from Last 3 Encounters:  05/17/19 216 lb 9.6 oz (98.2 kg)  04/14/19 217 lb (98.4 kg)  03/31/19 218 lb (98.9 kg)      Other studies Reviewed: Additional studies/ records that were reviewed today include: Labs. Review of the above records demonstrates:  Please see elsewhere in the note.     ASSESSMENT AND PLAN:   Acute on chronic systolic CHF (congestive heart failure) (HCC) Today about increasing contrast load and 97/103 twice daily.  He is having follow-up he notes it is been quite stable.    Cardiomyopathy, ischemic EF 20-25% May 2020 echo.  We will continue med  titration.  I might feel to go home beta-blocker in the future although he is quite fatigued and somewhat limited therapy.  CAD S/P percutaneous coronary angioplasty He has coronary disease as described above.  I am trying to impress on him the continued need for secondary risk reduction.  VT (ventricular tachycardia) (HCC) He has had no symptomatic recurrence.  He is up-to-date with ICD follow-up.  He will continue on low-dose of amiodarone.  We see him back with him in follow-up thyroid and liver enzymes.  They were done earlier this year.  He understands the need to avoid the sun with the amiodarone.  ICD (implantable cardioverter-defibrillator) in place This is followed by Dr. Elberta Fortis  Insulin dependent diabetes mellitus [facility]) This is followed by his primary provider.  I do not see a recent A1c and asked him to get this.  Dyslipidemia He is not on a statin.  He refuses to take 1.  He does not want a PCSK9 inhibitor.  He wants to bring it down himself.  Current medicines are reviewed at length with the patient today.  The patient does not have concerns regarding medicines.  The following changes have been made:  no change  Labs/ tests ordered today include: None No orders of the defined types were placed in this encounter.    Disposition:   FU with me in 2 months.     Signed, Rollene Rotunda, MD  05/17/2019 10:39 AM    [facility]

## 2019-05-17 ENCOUNTER — Encounter: Payer: Self-pay | Admitting: Cardiology

## 2019-05-17 ENCOUNTER — Ambulatory Visit: Payer: PRIVATE HEALTH INSURANCE | Admitting: Cardiology

## 2019-05-17 ENCOUNTER — Other Ambulatory Visit: Payer: Self-pay

## 2019-05-17 VITALS — BP 130/78 | HR 62 | Ht 70.0 in | Wt 216.6 lb

## 2019-05-17 DIAGNOSIS — Z9861 Coronary angioplasty status: Secondary | ICD-10-CM

## 2019-05-17 DIAGNOSIS — I5023 Acute on chronic systolic (congestive) heart failure: Secondary | ICD-10-CM | POA: Diagnosis not present

## 2019-05-17 DIAGNOSIS — I472 Ventricular tachycardia, unspecified: Secondary | ICD-10-CM

## 2019-05-17 DIAGNOSIS — I2510 Atherosclerotic heart disease of native coronary artery without angina pectoris: Secondary | ICD-10-CM | POA: Diagnosis not present

## 2019-05-17 DIAGNOSIS — E118 Type 2 diabetes mellitus with unspecified complications: Secondary | ICD-10-CM

## 2019-05-17 MED ORDER — ENTRESTO 97-103 MG PO TABS: 1.0000 | ORAL_TABLET | Freq: Two times a day (BID) | ORAL | 3 refills | Status: DC

## 2019-05-17 NOTE — Patient Instructions (Signed)
Medication Instructions:  Stop taking Entresto 49-51. Start taking Enteresto 97-103 twice daily.  If you need a refill on your cardiac medications before your next appointment, please call your pharmacy.   Lab work: NONE  Testing/Procedures: NONE  Follow-Up: At [facility], you and your health needs are our priority.  As part of our continuing mission to provide you with exceptional heart care, we have created designated Provider Care Teams.  These Care Teams include your primary Cardiologist (physician) and Advanced Practice Providers (APPs -  Physician Assistants and Nurse Practitioners) who all work together to provide you with the care you need, when you need it. You will need a follow up appointment in 2 months.  Please call our office 2 months in advance to schedule this appointment.  You may see Minus Breeding, MD or one of the following Advanced Practice Providers on your designated Care Team:   Rosaria Ferries, PA-C Jory Sims, DNP, ANP

## 2019-06-30 ENCOUNTER — Telehealth: Payer: Self-pay | Admitting: Cardiology

## 2019-06-30 MED ORDER — ENTRESTO 97-103 MG PO TABS: 1.0000 | ORAL_TABLET | Freq: Two times a day (BID) | ORAL | 3 refills | Status: DC

## 2019-06-30 MED ORDER — CARVEDILOL 3.125 MG PO TABS: 3.1250 mg | ORAL_TABLET | Freq: Two times a day (BID) | ORAL | 3 refills | Status: DC

## 2019-06-30 MED ORDER — CLOPIDOGREL BISULFATE 75 MG PO TABS: 75.0000 mg | ORAL_TABLET | Freq: Every day | ORAL | 3 refills | Status: DC

## 2019-06-30 MED ORDER — AMIODARONE HCL 200 MG PO TABS: 200.0000 mg | ORAL_TABLET | Freq: Every day | ORAL | 3 refills | Status: DC

## 2019-06-30 MED ORDER — SPIRONOLACTONE 25 MG PO TABS: 12.5000 mg | ORAL_TABLET | Freq: Every day | ORAL | 3 refills | Status: DC

## 2019-06-30 NOTE — Telephone Encounter (Signed)
Spoke with pt wife who states pt will be changing to [employer] as insurance company in the near future. She states she believes pt should have grace period up until the 30th to order refills through current insurance company. Pt wife requests the meds listed below to be refilled and sent to [facility]. She provided the following contact number for pharmacy [phone]  Amiodarone Clopidogrel Entresto spironolactone  Carvedilol   prescriptions sent to pharmacy requested

## 2019-06-30 NOTE — Telephone Encounter (Signed)
° ° °  Routed to NL Triage °

## 2019-06-30 NOTE — Telephone Encounter (Signed)
New message   Patient's wife states that effective 07/19/2019 and need to be sent to [facility] [phone]. Please call to discuss.

## 2019-06-30 NOTE — Addendum Note (Signed)
Addended by: Annita Brod on: 06/30/2019 12:52 PM   Modules accepted: Orders

## 2019-07-11 ENCOUNTER — Other Ambulatory Visit: Payer: Self-pay | Admitting: Cardiology

## 2019-07-11 NOTE — Telephone Encounter (Signed)
New Message   *STAT* If patient is at the pharmacy, call can be transferred to refill team.   1. Which medications need to be refilled? (please list name of each medication and dose if known) carvedilol (COREG) 3.125 MG tablet clopidogrel (PLAVIX) 75 MG tablet spironolactone (ALDACTONE) 25 MG tablet amiodarone (PACERONE) 200 MG tablet  sacubitril-valsartan (ENTRESTO) 97-103 MG   2. Which pharmacy/location (including street and city if local pharmacy) is medication to be sent to? [address] ([facility]) - [place], [place] - [address]  3. Do they need a 30 day or 90 day supply? All prescriptions 90 day supply

## 2019-07-12 ENCOUNTER — Telehealth: Payer: Self-pay | Admitting: Cardiology

## 2019-07-12 ENCOUNTER — Other Ambulatory Visit: Payer: Self-pay | Admitting: Cardiology

## 2019-07-12 ENCOUNTER — Other Ambulatory Visit: Payer: Self-pay

## 2019-07-12 NOTE — Telephone Encounter (Signed)
REFILL ALREADY SENT THIS MORNING

## 2019-07-12 NOTE — Telephone Encounter (Signed)
ALREADY SENT THIS AM

## 2019-07-12 NOTE — Telephone Encounter (Signed)
Follow Up:   Wife said pt did not receive his Entresto and Amiodarone.

## 2019-07-12 NOTE — Telephone Encounter (Signed)
°*  STAT* If patient is at the pharmacy, call can be transferred to refill team.   1. Which medications need to be refilled? (please list name of each medication and dose if known) ENTRESTO 24-26 MG  amiodarone (PACERONE) 200 MG tablet   2. Which pharmacy/location (including street and city if local pharmacy) is medication to be sent to? [address] ([facility]) - [place], [place] - [address]  3. Do they need a 30 day or 90 day supply? 90 day  Patients is completely out of medication.

## 2019-07-12 NOTE — Telephone Encounter (Signed)
ALREADY SENT TODAY

## 2019-07-16 NOTE — Progress Notes (Signed)
Cardiology Office Note   Date:  07/18/2019   ID:  Ludwin, Flahive Apr 24, 1956, MRN [id number]  PCP:  Hal Morales, NP  Cardiologist:   Rollene Rotunda, MD   Chief Complaint  Patient presents with  . Coronary Artery Disease      History of Present Illness: [name] is a 63 y.o. male who presents for  Evaluation of CAD.  He had an OM1 PCI in 2012.  In May 2019 he had a 90% CFX treated with DES.  The RCA was occluded with left ot right collaterals.  His EF then was 20-25% (down from 50-55% by echo in 2014).  In Dec 2019 he had an ICD placed.   He was admitted in May 2020 with VT.  Amiodarone was added.Cath done Jan 06 2019 showed the previously placed CFX and OM stents were patent. EF was unchanged-20-25%. The patient saw Dr. Elberta Fortis January 27, 2019 and his amiodarone was cut back to 200 mg daily.  At the last visit I increased his Entresto.    He did okay with this.  He has had fatigue and has to nap in the afternoon.  However he denies any other cardiovascular symptoms. The patient denies any new symptoms such as chest discomfort, neck or arm discomfort. There has been no new shortness of breath, PND or orthopnea. There have been no reported palpitations, presyncope or syncope.     Past Medical History:  Diagnosis Date  . ASCVD (arteriosclerotic cardiovascular disease)    a. MI 2004, did not seek care at that time. b. s/p PTCA/BMS to OM1 04/2011 (known totally occluded RCA with L-R collaterals and failed angioplasty on that vessel). c.)  Cath 2019 chronically occluded right coronary artery with collateralization.  90% circumflex stenosis treated with a DES.  Marland Kitchen Chronic bronchitis (HCC)   . HTN (hypertension)   . Hyperlipidemia   . Ischemic cardiomyopathy    EF 30%.    . OSA (obstructive sleep apnea) 05/24/11  . Restless leg syndrome 06/25/2011  . Type 2 diabetes mellitus (HCC)     Past Surgical History:  Procedure Laterality Date  . CARDIAC CATHETERIZATION   11/24/2012   40-50% ostial LAD, 40% mid LAD, 60-70% ostial D1, 40% proximal LCx, patent OM branch stent, 99% serial proximal-mid RCA lesions, 100% mid RCA occlusion (chronic with left to right collateralization); EF 40%, inferior wall hypokinesis.  Marland Kitchen CARDIAC CATHETERIZATION N/A 05/31/2015   Procedure: Left Heart Cath and Coronary Angiography;  Surgeon: Kathleene Hazel, MD;  Location: [facility] INVASIVE CV LAB;  Service: Cardiovascular;  Laterality: N/A;  . CORONARY STENT INTERVENTION N/A 12/25/2017   Procedure: CORONARY STENT INTERVENTION;  Surgeon: Swaziland, Peter M, MD;  Location: [facility] INVASIVE CV LAB;  Service: Cardiovascular;  Laterality: N/A;  . CORONARY STENT PLACEMENT  04/2011  . CYSTECTOMY     Spine and jaw  . ICD IMPLANT N/A 07/19/2018   Procedure: ICD IMPLANT;  Surgeon: Regan Lemming, MD;  Location: [facility] INVASIVE CV LAB;  Service: Cardiovascular;  Laterality: N/A;  . LEFT HEART CATH AND CORONARY ANGIOGRAPHY N/A 12/25/2017   Procedure: LEFT HEART CATH AND CORONARY ANGIOGRAPHY;  Surgeon: Swaziland, Peter M, MD;  Location: [facility] INVASIVE CV LAB;  Service: Cardiovascular;  Laterality: N/A;  . LEFT HEART CATHETERIZATION WITH CORONARY ANGIOGRAM N/A 11/24/2012   Procedure: LEFT HEART CATHETERIZATION WITH CORONARY ANGIOGRAM;  Surgeon: Kathleene Hazel, MD;  Location: [facility] CATH LAB;  Service: Cardiovascular;  Laterality: N/A;  . lesion removed  from foot     right foot  . RIGHT/LEFT HEART CATH AND CORONARY ANGIOGRAPHY N/A 01/06/2019   Procedure: RIGHT/LEFT HEART CATH AND CORONARY ANGIOGRAPHY;  Surgeon: Lorretta Harp, MD;  Location: [place] CV LAB;  Service: Cardiovascular;  Laterality: N/A;  . TONSILLECTOMY AND ADENOIDECTOMY       Current Outpatient Medications  Medication Sig Dispense Refill  . acetaminophen (TYLENOL) 500 MG tablet Take 500 mg by mouth every 6 (six) hours as needed (for headaches.).    Marland Kitchen albuterol (PROVENTIL HFA;VENTOLIN HFA) 108 (90 Base) MCG/ACT inhaler Inhale 1-2 puffs  into the lungs every 6 (six) hours as needed for wheezing or shortness of breath.    Marland Kitchen amiodarone (PACERONE) 200 MG tablet Take 1 tablet (200 mg total) by mouth daily. 90 tablet 3  . aspirin 81 MG chewable tablet Chew 1 tablet (81 mg total) by mouth daily.    . carvedilol (COREG) 6.25 MG tablet Take 1 tablet (6.25 mg total) by mouth 2 (two) times daily with a meal. 180 tablet 3  . CINNAMON PO Take 1,000 mg by mouth daily.    . clopidogrel (PLAVIX) 75 MG tablet TAKE 1 TABLET BY MOUTH EVERY DAY 90 tablet 3  . Coenzyme Q10 (CO Q 10) 100 MG CAPS Take 100 mg by mouth daily.    . furosemide (LASIX) 20 MG tablet TAKE 2 TABLETS DAILY FOR 3 DAYS THEN QD 30 tablet 0  . JARDIANCE 10 MG TABS tablet Take 10 mg by mouth daily.    . magnesium oxide (MAG-OX) 400 MG tablet Take 400 mg by mouth 2 (two) times daily.    . metFORMIN (GLUCOPHAGE) 1000 MG tablet Take 1,000 mg by mouth 2 (two) times daily.    . nitroGLYCERIN (NITROSTAT) 0.4 MG SL tablet Place 1 tablet (0.4 mg total) under the tongue every 5 (five) minutes as needed for chest pain. 30 tablet 1  . pantoprazole (PROTONIX) 40 MG tablet Take 1 tablet (40 mg total) by mouth every evening. (Patient taking differently: Take 40 mg by mouth daily. ) 30 tablet 0  . pramipexole (MIRAPEX) 1 MG tablet Take 1 mg by mouth 2 (two) times a day.     . sacubitril-valsartan (ENTRESTO) 97-103 MG Take 1 tablet by mouth 2 (two) times daily. 180 tablet 3  . spironolactone (ALDACTONE) 25 MG tablet TAKE 1/2 TABLET BY MOUTH EVERY DAY 45 tablet 3  . TOUJEO SOLOSTAR 300 UNIT/ML SOPN Inject 20 Units into the skin daily.     No current facility-administered medications for this visit.     Allergies:   Brilinta [ticagrelor], Codeine, Lipitor [atorvastatin calcium], and Niaspan [niacin er]    ROS:  Please see the history of present illness.   Otherwise, review of systems are positive for none.   All other systems are reviewed and negative.    PHYSICAL EXAM: VS:  BP 124/72    Pulse 67   Ht 5\' 10"  (1.778 m)   Wt 220 lb 12.8 oz (100.2 kg)   SpO2 95%   BMI 31.68 kg/m  , BMI Body mass index is 31.68 kg/m. GENERAL:  Well appearing NECK:  No jugular venous distention, waveform within normal limits, carotid upstroke brisk and symmetric, no bruits, no thyromegaly LUNGS:  Clear to auscultation bilaterally CHEST:  Well healed ICD pocket.  HEART:  PMI not displaced or sustained,S1 and S2 within normal limits, no S3, no S4, no clicks, no rubs, no murmurs ABD:  Flat, positive bowel sounds normal in frequency  in pitch, no bruits, no rebound, no guarding, no midline pulsatile mass, no hepatomegaly, no splenomegaly EXT:  2 plus pulses throughout, no edema, no cyanosis no clubbing   EKG:  EKG is not ordered today.    Recent Labs: 01/04/2019: B Natriuretic Peptide 187.1; TSH 2.742 01/08/2019: ALT 36; Magnesium 2.3 03/31/2019: Hemoglobin 16.5; NT-Pro BNP 55; Platelets 146 05/04/2019: BUN 15; Creatinine, Ser 1.08; Potassium 4.5; Sodium 137    Lipid Panel    Component Value Date/Time   CHOL 188 01/05/2019 0403   TRIG 336 (H) 01/05/2019 0403   HDL 33 (L) 01/05/2019 0403   CHOLHDL 5.7 01/05/2019 0403   VLDL 67 (H) 01/05/2019 0403   LDLCALC 88 01/05/2019 0403      Wt Readings from Last 3 Encounters:  07/18/19 220 lb 12.8 oz (100.2 kg)  05/17/19 216 lb 9.6 oz (98.2 kg)  04/14/19 217 lb (98.4 kg)      Other studies Reviewed: Additional studies/ records that were reviewed today include: None Review of the above records demonstrates:  Please see elsewhere in the note.     ASSESSMENT AND PLAN:   Acute on chronic systolic CHF (congestive heart failure) (HCC) Today I am going to increase his carvedilol 6.25 mg twice daily.   Cardiomyopathy, ischemic EF 20-25% May 2020 echo.  He seems to be euvolemic.  I will continue med titration as above.   CAD S/P percutaneous coronary angioplasty I continue to work with him on the need for secondary risk reduction.  VT  (ventricular tachycardia) (HCC) He is up-to-date with ICD follow-up.  He will continue with low-dose amiodarone.  When he comes back he will need TSH and liver enzymes if those have not been done by his primary provider.   ICD (implantable cardioverter-defibrillator) in place As above.  This is followed by Dr. Elberta Fortis  Insulin dependent diabetes mellitus [facility]) This is followed by his primary provider.  Last A1c was 7.5 but he is actively working with them on this.  Dyslipidemia He is not on a statin.  He refuses to take one no change in therapy per his request  COVID 36 Education We discussed the vaccine and other issues.   We gave him a flu shot today.   Current medicines are reviewed at length with the patient today.  The patient does not have concerns regarding medicines.  The following changes have been made:  As above  Labs/ tests ordered today include:   Orders Placed This Encounter  Procedures  . Flu Vaccine QUAD 36+ mos IM     Disposition:   FU with me in 3 months.     Signed, Rollene Rotunda, MD  07/18/2019 12:15 PM    [facility]

## 2019-07-18 ENCOUNTER — Other Ambulatory Visit: Payer: Self-pay

## 2019-07-18 ENCOUNTER — Ambulatory Visit: Payer: PRIVATE HEALTH INSURANCE | Admitting: Cardiology

## 2019-07-18 ENCOUNTER — Other Ambulatory Visit: Payer: Self-pay | Admitting: Cardiology

## 2019-07-18 ENCOUNTER — Encounter: Payer: Self-pay | Admitting: Cardiology

## 2019-07-18 VITALS — BP 124/72 | HR 67 | Ht 70.0 in | Wt 220.8 lb

## 2019-07-18 DIAGNOSIS — I255 Ischemic cardiomyopathy: Secondary | ICD-10-CM | POA: Diagnosis not present

## 2019-07-18 DIAGNOSIS — I2510 Atherosclerotic heart disease of native coronary artery without angina pectoris: Secondary | ICD-10-CM

## 2019-07-18 DIAGNOSIS — Z23 Encounter for immunization: Secondary | ICD-10-CM | POA: Diagnosis not present

## 2019-07-18 DIAGNOSIS — E785 Hyperlipidemia, unspecified: Secondary | ICD-10-CM

## 2019-07-18 DIAGNOSIS — I472 Ventricular tachycardia, unspecified: Secondary | ICD-10-CM

## 2019-07-18 DIAGNOSIS — I5023 Acute on chronic systolic (congestive) heart failure: Secondary | ICD-10-CM

## 2019-07-18 DIAGNOSIS — Z79899 Other long term (current) drug therapy: Secondary | ICD-10-CM

## 2019-07-18 DIAGNOSIS — Z7189 Other specified counseling: Secondary | ICD-10-CM

## 2019-07-18 DIAGNOSIS — E118 Type 2 diabetes mellitus with unspecified complications: Secondary | ICD-10-CM

## 2019-07-18 DIAGNOSIS — Z955 Presence of coronary angioplasty implant and graft: Secondary | ICD-10-CM

## 2019-07-18 DIAGNOSIS — E119 Type 2 diabetes mellitus without complications: Secondary | ICD-10-CM

## 2019-07-18 DIAGNOSIS — Z95810 Presence of automatic (implantable) cardiac defibrillator: Secondary | ICD-10-CM

## 2019-07-18 DIAGNOSIS — Z794 Long term (current) use of insulin: Secondary | ICD-10-CM

## 2019-07-18 MED ORDER — ENTRESTO 97-103 MG PO TABS: 1.0000 | ORAL_TABLET | Freq: Two times a day (BID) | ORAL | 3 refills | Status: DC

## 2019-07-18 MED ORDER — AMIODARONE HCL 200 MG PO TABS: 200.0000 mg | ORAL_TABLET | Freq: Every day | ORAL | 3 refills | Status: DC

## 2019-07-18 MED ORDER — CARVEDILOL 6.25 MG PO TABS: 6.4500 mg | ORAL_TABLET | Freq: Two times a day (BID) | ORAL | 3 refills | Status: DC

## 2019-07-18 NOTE — Telephone Encounter (Signed)
°*  STAT* If patient is at the pharmacy, call can be transferred to refill team.   1. Which medications need to be refilled? (please list name of each medication and dose if known) amiodarone (PACERONE) 200 MG tablet / sacubitril-valsartan (ENTRESTO) 97-103 MG  2. Which pharmacy/location (including street and city if local pharmacy) is medication to be sent to? [address] ([facility]) - [place], [place] - [address]  3. Do they need a 30 day or 90 day supply? 90   Patient states [facility] never received our prescription. Make the attention to [facility] when faxing.

## 2019-07-18 NOTE — Patient Instructions (Signed)
Medication Instructions:  Increase Carvedilol to 6.25mg  Twice Daily.  If you need a refill on your cardiac medications before your next appointment, please call your pharmacy.   Lab work: NONE  Testing/Procedures: NONE  Follow-Up: At [facility], you and your health needs are our priority.  As part of our continuing mission to provide you with exceptional heart care, we have created designated Provider Care Teams.  These Care Teams include your primary Cardiologist (physician) and Advanced Practice Providers (APPs -  Physician Assistants and Nurse Practitioners) who all work together to provide you with the care you need, when you need it. You may see Minus Breeding, MD or one of the following Advanced Practice Providers on your designated Care Team:    Rosaria Ferries, PA-C  Jory Sims, DNP, ANP  Cadence Kathlen Mody, NP  Your physician wants you to follow-up in: 3 months.

## 2019-07-26 ENCOUNTER — Ambulatory Visit (INDEPENDENT_AMBULATORY_CARE_PROVIDER_SITE_OTHER): Payer: PRIVATE HEALTH INSURANCE | Admitting: *Deleted

## 2019-07-26 DIAGNOSIS — I255 Ischemic cardiomyopathy: Secondary | ICD-10-CM

## 2019-07-27 LAB — CUP PACEART REMOTE DEVICE CHECK
Battery Remaining Longevity: 129 mo
Battery Voltage: 3.01 V
Brady Statistic RV Percent Paced: 0.18 %
Date Time Interrogation Session: 20201208052505
HighPow Impedance: 72 Ohm
Implantable Lead Implant Date: 20191202
Implantable Lead Location: 753860
Implantable Pulse Generator Implant Date: 20191202
Lead Channel Impedance Value: 285 Ohm
Lead Channel Impedance Value: 380 Ohm
Lead Channel Pacing Threshold Amplitude: 0.875 V
Lead Channel Pacing Threshold Pulse Width: 0.4 ms
Lead Channel Sensing Intrinsic Amplitude: 2.875 mV
Lead Channel Sensing Intrinsic Amplitude: 2.875 mV
Lead Channel Setting Pacing Amplitude: 2.5 V
Lead Channel Setting Pacing Pulse Width: 0.4 ms
Lead Channel Setting Sensing Sensitivity: 0.3 mV

## 2019-08-23 ENCOUNTER — Telehealth: Payer: Self-pay | Admitting: *Deleted

## 2019-08-23 NOTE — Telephone Encounter (Signed)
Received notification from [facility] [employer] approved 07/26/19-07/25/2020

## 2019-10-08 NOTE — Progress Notes (Addendum)
Cardiology Office Note   Date:  10/10/2019   ID:  Kayvon, Mo October 20, 1955, MRN [id number]  PCP:  Hal Morales, NP  Cardiologist:   Rollene Rotunda, MD   No chief complaint on file.     History of Present Illness: [name] is a 64 y.o. male who presents for  Evaluation of CAD.  He had an OM1 PCI in 2012.  In May 2019 he had a 90% CFX treated with DES.  The RCA was occluded with left ot right collaterals.  His EF then was 20-25% (down from 50-55% by echo in 2014).  In Dec 2019 he had an ICD placed.   He was admitted in May 2020 with VT.  Amiodarone was added.Cath done Jan 06 2019 showed the previously placed CFX and OM stents were patent. EF was unchanged-20-25%. The patient saw Dr. Elberta Fortis January 27, 2019 and his amiodarone was cut back to 200 mg daily.    I have titrated his Sherryll Burger recently.  Unfortunately despite maximal medical therapy he continues to have increasing fatigue and shortness of breath.  He has class III symptoms.  He has to rest when he is trying to work around the house.  I am filling out disability papers on him.  He denies any chest pressure, neck or arm discomfort.  Has had no further palpitations.  He feels lightheaded when he gets short of breath and fatigue.  He is not describing PND or orthopnea.  Has had no weight gain or edema.    Past Medical History:  Diagnosis Date  . ASCVD (arteriosclerotic cardiovascular disease)    a. MI 2004, did not seek care at that time. b. s/p PTCA/BMS to OM1 04/2011 (known totally occluded RCA with L-R collaterals and failed angioplasty on that vessel). c.)  Cath 2019 chronically occluded right coronary artery with collateralization.  90% circumflex stenosis treated with a DES.  Marland Kitchen Chronic bronchitis (HCC)   . HTN (hypertension)   . Hyperlipidemia   . Ischemic cardiomyopathy    EF 30%.    . OSA (obstructive sleep apnea) 05/24/11  . Restless leg syndrome 06/25/2011  . Type 2 diabetes mellitus (HCC)      Past Surgical History:  Procedure Laterality Date  . CARDIAC CATHETERIZATION  11/24/2012   40-50% ostial LAD, 40% mid LAD, 60-70% ostial D1, 40% proximal LCx, patent OM branch stent, 99% serial proximal-mid RCA lesions, 100% mid RCA occlusion (chronic with left to right collateralization); EF 40%, inferior wall hypokinesis.  Marland Kitchen CARDIAC CATHETERIZATION N/A 05/31/2015   Procedure: Left Heart Cath and Coronary Angiography;  Surgeon: Kathleene Hazel, MD;  Location: [facility] INVASIVE CV LAB;  Service: Cardiovascular;  Laterality: N/A;  . CORONARY STENT INTERVENTION N/A 12/25/2017   Procedure: CORONARY STENT INTERVENTION;  Surgeon: Swaziland, Peter M, MD;  Location: [facility] INVASIVE CV LAB;  Service: Cardiovascular;  Laterality: N/A;  . CORONARY STENT PLACEMENT  04/2011  . CYSTECTOMY     Spine and jaw  . ICD IMPLANT N/A 07/19/2018   Procedure: ICD IMPLANT;  Surgeon: Regan Lemming, MD;  Location: [facility] INVASIVE CV LAB;  Service: Cardiovascular;  Laterality: N/A;  . LEFT HEART CATH AND CORONARY ANGIOGRAPHY N/A 12/25/2017   Procedure: LEFT HEART CATH AND CORONARY ANGIOGRAPHY;  Surgeon: Swaziland, Peter M, MD;  Location: [facility] INVASIVE CV LAB;  Service: Cardiovascular;  Laterality: N/A;  . LEFT HEART CATHETERIZATION WITH CORONARY ANGIOGRAM N/A 11/24/2012   Procedure: LEFT HEART CATHETERIZATION WITH CORONARY ANGIOGRAM;  Surgeon:  Kathleene Hazel, MD;  Location: [facility] CATH LAB;  Service: Cardiovascular;  Laterality: N/A;  . lesion removed from foot     right foot  . RIGHT/LEFT HEART CATH AND CORONARY ANGIOGRAPHY N/A 01/06/2019   Procedure: RIGHT/LEFT HEART CATH AND CORONARY ANGIOGRAPHY;  Surgeon: Runell Gess, MD;  Location: [facility] INVASIVE CV LAB;  Service: Cardiovascular;  Laterality: N/A;  . TONSILLECTOMY AND ADENOIDECTOMY       Current Outpatient Medications  Medication Sig Dispense Refill  . acetaminophen (TYLENOL) 500 MG tablet Take 500 mg by mouth every 6 (six) hours as needed (for headaches.).    Marland Kitchen  albuterol (PROVENTIL HFA;VENTOLIN HFA) 108 (90 Base) MCG/ACT inhaler Inhale 1-2 puffs into the lungs every 6 (six) hours as needed for wheezing or shortness of breath.    Marland Kitchen amiodarone (PACERONE) 200 MG tablet Take 1 tablet (200 mg total) by mouth daily. 90 tablet 3  . aspirin 81 MG chewable tablet Chew 1 tablet (81 mg total) by mouth daily.    . carvedilol (COREG) 6.25 MG tablet Take 1 tablet (6.25 mg total) by mouth 2 (two) times daily with a meal. 180 tablet 3  . CINNAMON PO Take 1,000 mg by mouth daily.    . clopidogrel (PLAVIX) 75 MG tablet TAKE 1 TABLET BY MOUTH EVERY DAY 90 tablet 3  . Coenzyme Q10 (CO Q 10) 100 MG CAPS Take 100 mg by mouth daily.    . furosemide (LASIX) 40 MG tablet Take 1 tablet daily 90 tablet 3  . JARDIANCE 10 MG TABS tablet Take 10 mg by mouth daily.    . magnesium oxide (MAG-OX) 400 MG tablet Take 400 mg by mouth 2 (two) times daily.    . metFORMIN (GLUCOPHAGE) 1000 MG tablet Take 1,000 mg by mouth 2 (two) times daily.    . nitroGLYCERIN (NITROSTAT) 0.4 MG SL tablet Place 1 tablet (0.4 mg total) under the tongue every 5 (five) minutes as needed for chest pain. 30 tablet 1  . pantoprazole (PROTONIX) 40 MG tablet Take 1 tablet (40 mg total) by mouth every evening. (Patient taking differently: Take 40 mg by mouth daily. ) 30 tablet 0  . pramipexole (MIRAPEX) 1 MG tablet Take 1 mg by mouth 2 (two) times a day.     . sacubitril-valsartan (ENTRESTO) 97-103 MG Take 1 tablet by mouth 2 (two) times daily. 180 tablet 3  . spironolactone (ALDACTONE) 25 MG tablet TAKE 1/2 TABLET BY MOUTH EVERY DAY 45 tablet 3  . TOUJEO SOLOSTAR 300 UNIT/ML SOPN Inject 20 Units into the skin daily.     No current facility-administered medications for this visit.    Allergies:   Brilinta [ticagrelor], Codeine, Lipitor [atorvastatin calcium], and Niaspan [niacin er]    ROS:  Please see the history of present illness.   Otherwise, review of systems are positive for none.   All other systems are  reviewed and negative.    PHYSICAL EXAM: VS:  BP 124/68   Pulse 60   Temp (!) 97.2 F (36.2 C)   Ht 5\' 10"  (1.778 m)   Wt 223 lb 12.8 oz (101.5 kg)   SpO2 94%   BMI 32.11 kg/m  , BMI Body mass index is 32.11 kg/m. GENERAL:  Well appearing NECK:  No jugular venous distention, waveform within normal limits, carotid upstroke brisk and symmetric, no bruits, no thyromegaly LUNGS:  Clear to auscultation bilaterally CHEST: Well-healed ICD pocket HEART:  PMI not displaced or sustained,S1 and S2 within normal limits, no  S3, no S4, no clicks, no rubs, no murmurs ABD:  Flat, positive bowel sounds normal in frequency in pitch, no bruits, no rebound, no guarding, no midline pulsatile mass, no hepatomegaly, no splenomegaly EXT:  2 plus pulses throughout, no edema, no cyanosis no clubbing   EKG:  EKG is  ordered today. Sinus rhythm, rate 60, axis within normal limits, intervals within normal limits, no acute ST-T wave changes.   Recent Labs: 01/04/2019: B Natriuretic Peptide 187.1; TSH 2.742 01/08/2019: ALT 36; Magnesium 2.3 03/31/2019: Hemoglobin 16.5; NT-Pro BNP 55; Platelets 146 05/04/2019: Ramos 15; Creatinine, Ser 1.08; Potassium 4.5; Sodium 137    Lipid Panel    Component Value Date/Time   CHOL 188 01/05/2019 0403   TRIG 336 (H) 01/05/2019 0403   HDL 33 (L) 01/05/2019 0403   CHOLHDL 5.7 01/05/2019 0403   VLDL 67 (H) 01/05/2019 0403   LDLCALC 88 01/05/2019 0403      Wt Readings from Last 3 Encounters:  10/10/19 223 lb 12.8 oz (101.5 kg)  07/18/19 220 lb 12.8 oz (100.2 kg)  05/17/19 216 lb 9.6 oz (98.2 kg)      Other studies Reviewed: Additional studies/ records that were reviewed today include: Labs Review of the above records demonstrates: NA    ASSESSMENT AND PLAN:   Chronic systolic CHF (congestive heart failure) ([place]) :   The patient is feeling OMT.  He has class III symptoms.  I do not feel like I can titrate his medications further other than to give him a diuretic.   He had a normal proBNP and does not seem overtly volume overloaded.  I think he has low up with symptoms.  I am going to refer him to the [facility].  Of note with his fatigue be ordering TSH and CBC though these have been normal in the past year.  Finally because of going up on his Lasix I will check a basic metabolic profile he comes back.  I will repeat an echo before that appt.   Cardiomyopathy, ischemic :   As above.  CAD S/P percutaneous coronary angioplasty:   He has   VT (ventricular tachycardia) [facility]): He is up-to-date with ICD follow-up.  ICD (implantable cardioverter-defibrillator): He is followed by Dr. Curt Bears.  Insulin dependent diabetes mellitus ([place]): A1c was 7.5.  No remain on meds as listed.  Dyslipidemia:  He has not wanted to take statins.   He refuses to take one no change in therapy per his request  Sleep apnea :  He has not used CPAP and, speaking to his wife, he would not want follow up at this point.   Current medicines are reviewed at length with the patient today.  The patient does not have concerns regarding medicines.  The following changes have been made:  As above  Labs/ tests ordered today include:   Orders Placed This Encounter  Procedures  . Basic metabolic panel  . TSH  . CBC  . EKG 12-Lead  . ECHOCARDIOGRAM COMPLETE     Disposition:   FU with me in 3 months.     Signed, Minus Breeding, MD  10/10/2019 11:31 AM    [facility]

## 2019-10-10 ENCOUNTER — Encounter: Payer: Self-pay | Admitting: Cardiology

## 2019-10-10 ENCOUNTER — Telehealth: Payer: Self-pay | Admitting: Cardiology

## 2019-10-10 ENCOUNTER — Other Ambulatory Visit: Payer: Self-pay

## 2019-10-10 ENCOUNTER — Ambulatory Visit (INDEPENDENT_AMBULATORY_CARE_PROVIDER_SITE_OTHER): Payer: BC Managed Care – PPO | Admitting: Cardiology

## 2019-10-10 VITALS — BP 124/68 | HR 60 | Temp 97.2°F | Ht 70.0 in | Wt 223.8 lb

## 2019-10-10 DIAGNOSIS — I2510 Atherosclerotic heart disease of native coronary artery without angina pectoris: Secondary | ICD-10-CM

## 2019-10-10 DIAGNOSIS — I472 Ventricular tachycardia, unspecified: Secondary | ICD-10-CM

## 2019-10-10 DIAGNOSIS — I255 Ischemic cardiomyopathy: Secondary | ICD-10-CM | POA: Diagnosis not present

## 2019-10-10 DIAGNOSIS — Z7189 Other specified counseling: Secondary | ICD-10-CM

## 2019-10-10 DIAGNOSIS — E118 Type 2 diabetes mellitus with unspecified complications: Secondary | ICD-10-CM

## 2019-10-10 MED ORDER — FUROSEMIDE 40 MG PO TABS: ORAL_TABLET | ORAL | 3 refills | Status: DC

## 2019-10-10 NOTE — Telephone Encounter (Signed)
Patient's wife calling stating she would like to know why the patient's echo was scheduled at the hospital and if the patient has gotten worse. She would also like to find out if she can come with the patient to his echo appointment tomorrow. Please advise.

## 2019-10-10 NOTE — Telephone Encounter (Signed)
Spoke to pt wife per DPR. She was concerned about the echo, medications, and referral sent to the heart failure clinic. She was frustrated because she thought that the pt was not going to see Dr. Delrae Sawyers anymore. Explained to pt wife that Dr. Antoine Poche put in referral to Heart Failure Clinic because the cardiologist all work together and have different specialties. Pt wife understood and was thankful for the explanation. She was wanting to know why the Echo was scheduled at the hospital vs [facility]. Will send to Dr. Antoine Poche and his nurse for review.

## 2019-10-10 NOTE — Patient Instructions (Addendum)
Medication Instructions:  Increase Lasix to 40mg  daily *If you need a refill on your cardiac medications before your next appointment, please call your pharmacy*  Lab Work: Your physician recommends that you return for lab work at your appointment with Dr.  Testing/Procedures: Your physician has requested that you have an echocardiogram. Echocardiography is a painless test that uses sound waves to create images of your heart. It provides your doctor with information about the size and shape of your heart and how well your heart's chambers and valves are working. This procedure takes approximately one hour. There are no restrictions for this procedure. [address]  Follow-Up: At [facility], you and your health needs are our priority.  As part of our continuing mission to provide you with exceptional heart care, we have created designated Provider Care Teams.  These Care Teams include your primary Cardiologist (physician) and Advanced Practice Providers (APPs -  Physician Assistants and Nurse Practitioners) who all work together to provide you with the care you need, when you need it.  Your next appointment:   2 week(s)  The format for your next appointment:   In Person  Provider:   Dr. [facility]

## 2019-10-10 NOTE — Telephone Encounter (Signed)
I called and spoke at length to his wife to catch her up with the appt today.

## 2019-10-10 NOTE — Telephone Encounter (Signed)
Spoke with patient's spouse. Clarified echo location and why it is being done at the hospital. Spouse verbalized understanding. No further questions at this time.

## 2019-10-11 ENCOUNTER — Ambulatory Visit (HOSPITAL_COMMUNITY)
Admission: RE | Admit: 2019-10-11 | Discharge: 2019-10-11 | Disposition: A | Discharge status: Home / Self Care | Payer: BC Managed Care – PPO | Source: Ambulatory Visit | Attending: Cardiology | Admitting: Cardiology

## 2019-10-11 DIAGNOSIS — E119 Type 2 diabetes mellitus without complications: Secondary | ICD-10-CM | POA: Insufficient documentation

## 2019-10-11 DIAGNOSIS — I472 Ventricular tachycardia: Secondary | ICD-10-CM | POA: Diagnosis not present

## 2019-10-11 DIAGNOSIS — I255 Ischemic cardiomyopathy: Secondary | ICD-10-CM | POA: Diagnosis present

## 2019-10-11 DIAGNOSIS — G4730 Sleep apnea, unspecified: Secondary | ICD-10-CM | POA: Insufficient documentation

## 2019-10-11 DIAGNOSIS — Z95810 Presence of automatic (implantable) cardiac defibrillator: Secondary | ICD-10-CM | POA: Diagnosis not present

## 2019-10-11 DIAGNOSIS — I10 Essential (primary) hypertension: Secondary | ICD-10-CM | POA: Insufficient documentation

## 2019-10-11 DIAGNOSIS — I25110 Atherosclerotic heart disease of native coronary artery with unstable angina pectoris: Secondary | ICD-10-CM | POA: Insufficient documentation

## 2019-10-11 NOTE — Progress Notes (Signed)
  Echocardiogram 2D Echocardiogram has been performed.  [name] 10/11/2019, 9:33 AM

## 2019-10-13 ENCOUNTER — Telehealth (HOSPITAL_COMMUNITY): Payer: Self-pay | Admitting: Vascular Surgery

## 2019-10-13 NOTE — Telephone Encounter (Signed)
Left pt message giving new chf appt w/ [facility] 10/17/19, asked pt to call back to confirm appt

## 2019-10-17 ENCOUNTER — Ambulatory Visit (HOSPITAL_COMMUNITY)
Admission: RE | Admit: 2019-10-17 | Discharge: 2019-10-17 | Disposition: A | Discharge status: Home / Self Care | Payer: BC Managed Care – PPO | Source: Ambulatory Visit | Attending: Cardiology | Admitting: Cardiology

## 2019-10-17 ENCOUNTER — Other Ambulatory Visit: Payer: Self-pay

## 2019-10-17 VITALS — BP 108/72 | HR 57 | Wt 221.2 lb

## 2019-10-17 DIAGNOSIS — Z955 Presence of coronary angioplasty implant and graft: Secondary | ICD-10-CM | POA: Diagnosis not present

## 2019-10-17 DIAGNOSIS — I493 Ventricular premature depolarization: Secondary | ICD-10-CM

## 2019-10-17 DIAGNOSIS — I2510 Atherosclerotic heart disease of native coronary artery without angina pectoris: Secondary | ICD-10-CM

## 2019-10-17 DIAGNOSIS — Z79899 Other long term (current) drug therapy: Secondary | ICD-10-CM | POA: Diagnosis not present

## 2019-10-17 DIAGNOSIS — Z8249 Family history of ischemic heart disease and other diseases of the circulatory system: Secondary | ICD-10-CM | POA: Diagnosis not present

## 2019-10-17 DIAGNOSIS — E119 Type 2 diabetes mellitus without complications: Secondary | ICD-10-CM | POA: Insufficient documentation

## 2019-10-17 DIAGNOSIS — Z7902 Long term (current) use of antithrombotics/antiplatelets: Secondary | ICD-10-CM | POA: Diagnosis not present

## 2019-10-17 DIAGNOSIS — I255 Ischemic cardiomyopathy: Secondary | ICD-10-CM | POA: Diagnosis not present

## 2019-10-17 DIAGNOSIS — I110 Hypertensive heart disease with heart failure: Secondary | ICD-10-CM | POA: Diagnosis not present

## 2019-10-17 DIAGNOSIS — E785 Hyperlipidemia, unspecified: Secondary | ICD-10-CM | POA: Diagnosis not present

## 2019-10-17 DIAGNOSIS — Z95810 Presence of automatic (implantable) cardiac defibrillator: Secondary | ICD-10-CM | POA: Diagnosis not present

## 2019-10-17 DIAGNOSIS — I472 Ventricular tachycardia, unspecified: Secondary | ICD-10-CM

## 2019-10-17 DIAGNOSIS — I5023 Acute on chronic systolic (congestive) heart failure: Secondary | ICD-10-CM

## 2019-10-17 DIAGNOSIS — Z7982 Long term (current) use of aspirin: Secondary | ICD-10-CM | POA: Insufficient documentation

## 2019-10-17 DIAGNOSIS — Z794 Long term (current) use of insulin: Secondary | ICD-10-CM | POA: Insufficient documentation

## 2019-10-17 DIAGNOSIS — I5022 Chronic systolic (congestive) heart failure: Secondary | ICD-10-CM | POA: Insufficient documentation

## 2019-10-17 DIAGNOSIS — G4733 Obstructive sleep apnea (adult) (pediatric): Secondary | ICD-10-CM | POA: Diagnosis not present

## 2019-10-17 LAB — BRAIN NATRIURETIC PEPTIDE: B Natriuretic Peptide: 46.9 pg/mL (ref 0.0–100.0)

## 2019-10-17 LAB — CBC
HCT: 47.3 % (ref 39.0–52.0)
Hemoglobin: 16.2 g/dL (ref 13.0–17.0)
MCH: 32.9 pg (ref 26.0–34.0)
MCHC: 34.2 g/dL (ref 30.0–36.0)
MCV: 96.1 fL (ref 80.0–100.0)
Platelets: 151 10*3/uL (ref 150–400)
RBC: 4.92 MIL/uL (ref 4.22–5.81)
RDW: 13.2 % (ref 11.5–15.5)
WBC: 8.8 10*3/uL (ref 4.0–10.5)
nRBC: 0 % (ref 0.0–0.2)

## 2019-10-17 LAB — COMPREHENSIVE METABOLIC PANEL
ALT: 44 U/L (ref 0–44)
AST: 31 U/L (ref 15–41)
Albumin: 4 g/dL (ref 3.5–5.0)
Alkaline Phosphatase: 45 U/L (ref 38–126)
Anion gap: 13 (ref 5–15)
BUN: 26 mg/dL — ABNORMAL HIGH (ref 8–23)
CO2: 19 mmol/L — ABNORMAL LOW (ref 22–32)
Calcium: 9.1 mg/dL (ref 8.9–10.3)
Chloride: 104 mmol/L (ref 98–111)
Creatinine, Ser: 1.24 mg/dL (ref 0.61–1.24)
GFR calc Af Amer: >60 mL/min (ref >60)
GFR calc non Af Amer: >60 mL/min (ref >60)
Glucose, Bld: 142 mg/dL — ABNORMAL HIGH (ref 70–99)
Potassium: 4.1 mmol/L (ref 3.5–5.1)
Sodium: 136 mmol/L (ref 135–145)
Total Bilirubin: 0.8 mg/dL (ref 0.3–1.2)
Total Protein: 7.1 g/dL (ref 6.5–8.1)

## 2019-10-17 LAB — TSH: TSH: 2.819 u[IU]/mL (ref 0.350–4.500)

## 2019-10-17 MED ORDER — SPIRONOLACTONE 25 MG PO TABS: 25.0000 mg | ORAL_TABLET | Freq: Every day | ORAL | 3 refills | Status: DC

## 2019-10-17 MED ORDER — FUROSEMIDE 40 MG PO TABS: 40.0000 mg | ORAL_TABLET | Freq: Two times a day (BID) | ORAL | 3 refills | Status: DC

## 2019-10-17 NOTE — Progress Notes (Signed)
Zio patch placed onto patient.  All instructions and information reviewed with patient, they verbalize understanding with no questions. 

## 2019-10-17 NOTE — Patient Instructions (Addendum)
INCREASE Spironolactone to 25mg  (1 tab) daily   INCREASE Lasix to 40mg  twice a day   You have been referred to Dr for a new mask.  Her office will call you to schedule this appointment.    You have been referred to the lipid clinic to manage your cholesterol.  They will call you to schedule this appointment.    You were referred to Cardiac Rehab.  They will call  You to schedule this appointment.    Your physician has recommended that you have a cardiopulmonary stress test (CPX). CPX testing is a non-invasive measurement of heart and lung function. It replaces a traditional treadmill stress test. This type of test provides a tremendous amount of information that relates not only to your present condition but also for future outcomes. This test combines measurements of you ventilation, respiratory gas exchange in the lungs, electrocardiogram (EKG), blood pressure and physical response before, during, and following an exercise protocol.   Your provider has recommended that  you wear a Zio Patch for 3 days.  This monitor will record your heart rhythm for our review.  IF you have any symptoms while wearing the monitor please press the button.  If you have any issues with the patch or you notice a red or orange light on it please call the company at [phone].  Once you remove the patch please mail it back to the company as soon as possible so we can get the results.   Labs today and repeat in 10 days. Please fax results to [phone] We will only contact you if something comes back abnormal or we need to make some changes. Otherwise no news is good news!   Your physician recommends that you schedule a follow-up appointment in: 1 month with Dr [phone]   Please call office at [phone] option [phone] if you have any questions or concerns.    At [facility], you and your health needs are our priority. As part of our continuing mission to provide you with  exceptional heart care, we have created designated Provider Care Teams. These Care Teams include your primary Cardiologist (physician) and Advanced Practice Providers (APPs- Physician Assistants and Nurse Practitioners) who all work together to provide you with the care you need, when you need it.   You may see any of the following providers on your designated Care Team at your next follow up: Shirlee Latch Dr [phone] . Dr Marland Kitchen . Arvilla Meres, NP . Marca Ancona, PA . Tonye Becket, PharmD   Please be sure to bring in all your medications bottles to every appointment.

## 2019-10-18 NOTE — Progress Notes (Signed)
PCP: Steve Morales, NP Cardiology: Dr. Antoine Poche HF Cardiology: Dr. Shirlee Latch  64 y.o. with history of CAD, ischemic cardiomyopathy/chronic systolic CHF, OSA, and VT was referred by Dr. Antoine Poche for evaluation of CHF.  Patient has a long history of CAD (see PMH below).  Last cath in 5/20 showed a chronically occluded RCA with collaterals and stable stents in the LCx system, no intervention.  He has had an ischemic cardiomyopathy with EF 20-25% in 5/20, and actually up to 35-40% on 2/21 echo (I reviewed images today).  He has a Medtronic ICD and is on amiodarone for VT. He has significant OSA but has not been able to tolerate CPAP with a facemask.    His main complaint for since 6/20 has been very poor energy/fatigue. Symptoms are up and down, some days he is short of breath with moderate activities but on other days he does not notice much dyspnea.  Generally short of breath after walking about 500 feet (has to stop).  No chest pain or tightness.  He sleeps on 2 pillows and elevates the head of the bed.  Per his wife, he will stop breathing for relatively long periods of time in his sleep. He has been noted to have frequent PVCs in the past.  He does not feel palpitations.   ECG (personally reviewed): NSR at 57, left axis deviation.   REDS clip: 43%  Labs (9/20): K 4.5, creatinine 1.08  Medtronic device interrogation: No AF, no VT, stable thoracic impedance  PMH: 1. CAD:  - PCI to OM1 in 2012.  - DES to LCx in 5/19.  - LHC (5/20): 50% proximal LAD, 95% proximal D1, patent LCx and OM1 stents, total occlusion of RCA with collaterals.  2. Chronic systolic CHF: Ischemic cardiomyopathy. Medtronic ICD.  - RHC (5/20): mean RA 3, PA 25/5, mean PCWP 6, CI 1.86 - Echo (5/20): EF 20-25% - Echo (2/21): EF 35-40%, mild LV dilation, RV low normal function.  3. VT: Now on amiodarone.  4. OSA: Has not tolerate CPAP.  5. HTN 6. Type 2 diabetes 7. Hyperlipidemia: Has not tolerate statins.  8. PVCs  SH:  Married, lives in [place], retired, quit smoking in 2007.   Family History  Problem Relation Age of Onset  . Lymphoma Mother   . Heart attack Father 14  . Heart attack Brother 51   ROS: All systems reviewed and negative except as per HPI.   Current Outpatient Medications  Medication Sig Dispense Refill  . acetaminophen (TYLENOL) 500 MG tablet Take 500 mg by mouth every 6 (six) hours as needed (for headaches.).    Marland Kitchen albuterol (PROVENTIL HFA;VENTOLIN HFA) 108 (90 Base) MCG/ACT inhaler Inhale 1-2 puffs into the lungs every 6 (six) hours as needed for wheezing or shortness of breath.    Marland Kitchen amiodarone (PACERONE) 200 MG tablet Take 1 tablet (200 mg total) by mouth daily. 90 tablet 3  . aspirin 81 MG chewable tablet Chew 1 tablet (81 mg total) by mouth daily.    . carvedilol (COREG) 6.25 MG tablet Take 1 tablet (6.25 mg total) by mouth 2 (two) times daily with a meal. 180 tablet 3  . CINNAMON PO Take 1,000 mg by mouth daily.    . clopidogrel (PLAVIX) 75 MG tablet TAKE 1 TABLET BY MOUTH EVERY DAY 90 tablet 3  . Coenzyme Q10 (CO Q 10) 100 MG CAPS Take 100 mg by mouth daily.    . furosemide (LASIX) 40 MG tablet Take 1 tablet (40 mg  total) by mouth 2 (two) times daily. Take 1 tablet daily 180 tablet 3  . JARDIANCE 10 MG TABS tablet Take 10 mg by mouth daily.    . magnesium oxide (MAG-OX) 400 MG tablet Take 400 mg by mouth 2 (two) times daily.    . metFORMIN (GLUCOPHAGE) 1000 MG tablet Take 1,000 mg by mouth 2 (two) times daily.    . nitroGLYCERIN (NITROSTAT) 0.4 MG SL tablet Place 1 tablet (0.4 mg total) under the tongue every 5 (five) minutes as needed for chest pain. 30 tablet 1  . pantoprazole (PROTONIX) 40 MG tablet Take 1 tablet (40 mg total) by mouth every evening. (Patient taking differently: Take 40 mg by mouth daily. ) 30 tablet 0  . pramipexole (MIRAPEX) 1 MG tablet Take 1 mg by mouth 2 (two) times a day.     . sacubitril-valsartan (ENTRESTO) 97-103 MG Take 1 tablet by mouth 2 (two) times  daily. 180 tablet 3  . spironolactone (ALDACTONE) 25 MG tablet Take 1 tablet (25 mg total) by mouth daily. 90 tablet 3  . TOUJEO SOLOSTAR 300 UNIT/ML SOPN Inject 20 Units into the skin daily.     No current facility-administered medications for this encounter.   BP 108/72   Pulse (!) 57   Wt 100.3 kg (221 lb 3.2 oz)   SpO2 96%   BMI 31.74 kg/m  General: NAD Neck: Thick, JVP difficult but appears 8-9 cm, no thyromegaly or thyroid nodule.  Lungs: Clear to auscultation bilaterally with normal respiratory effort. CV: Nondisplaced PMI.  Heart regular S1/S2, no S3/S4, no murmur.  1+ ankle edema.  No carotid bruit.  Normal pedal pulses.  Abdomen: Soft, nontender, no hepatosplenomegaly, no distention.  Skin: Intact without lesions or rashes.  Neurologic: Alert and oriented x 3.  Psych: Normal affect. Extremities: No clubbing or cyanosis.  HEENT: Normal.   Assessment/Plan: 1. CAD: Cath in 5/20 showed chronically occluded RCA with patent LCx system stents.  No interventional target.  No chest pain recently.  He does not take statins due to myalgias.  - Continue ASA 81 daily and Plavix 75 mg daily. Could consider changing from Plavix to rivaroxaban 2.5 mg bid in the future (would be good candidate for COMPASS regimen).  - He is open to use of PCSK9 inhibitor, I will refer to lipid clinic.  2. Chronic systolic CHF: Ischemic cardiomyopathy.  Medtronic ICD.  Narrow QRS so not CRT candidate.  Echo in 5/20 with EF 20-25%, but echo in 2/21 looked better, with EF 35-40%.  However, patient is still significantly fatigued and short of breath with exertion. Exam is somewhat difficult for volume but he looks mildly volume overloaded. REDS clip was significantly elevated at 43%.  Optivol from device did not suggest volume overload.  Given the totality of evidence, suspect he does have some volume overload.  NYHA class III symptoms. I suspect untreated OSA plays a role in his fatigue.  - Increase Lasix to 40 mg  bid.  BMET today and again in 10 days.  - Continue Entresto 97/103 bid.  - Increase spironolactone to 25 mg daily.  - Continue Coreg 6.25 mg bid.  With mild bradycardia and fatigue would hold off on increasing for now.  - I will arrange for CPX, question if deconditioning/body habitus/restrictive lung function is playing a significant role in his symptoms.  - I will arrange for cardiac rehab at [facility]  3. OSA: Untreated, sounds severe based on his wife's description.  He cannot tolerate  the facemask.  - Refer to Dr.Turner for evaluation, would see if he could tolerate nasal CPAP or nasal prongs.  4. PVCs: History of PVCs, now on amiodarone.  - Will get Zio patch x 3 days to quantify PVCs, ?if they play a role in symptoms/cardiomyopathy.  - With amiodarone use, due for LFTs and TSH (draw today).  He will need a regular eye exam.   Followup in 1 month  [name] 10/18/2019

## 2019-10-19 ENCOUNTER — Ambulatory Visit (INDEPENDENT_AMBULATORY_CARE_PROVIDER_SITE_OTHER): Payer: BC Managed Care – PPO | Admitting: Pharmacist

## 2019-10-19 ENCOUNTER — Other Ambulatory Visit: Payer: Self-pay

## 2019-10-19 ENCOUNTER — Telehealth (HOSPITAL_COMMUNITY): Payer: Self-pay

## 2019-10-19 DIAGNOSIS — E785 Hyperlipidemia, unspecified: Secondary | ICD-10-CM

## 2019-10-19 MED ORDER — ICOSAPENT ETHYL 1 G PO CAPS: 2.0000 g | ORAL_CAPSULE | Freq: Two times a day (BID) | ORAL | 11 refills | Status: DC

## 2019-10-19 MED ORDER — PRALUENT 75 MG/ML ~~LOC~~ SOAJ: 75.0000 mg | SUBCUTANEOUS | 11 refills | Status: DC

## 2019-10-19 NOTE — Patient Instructions (Addendum)
It was nice to meet you today   Your LDL is 88 and your goal is < 55  Start Praluent injections every 14 days. This medication is stored in the fridge, is given every 2 weeks into the fatty tissue of your stomach, and lowers your LDL cholesterol by 60%   Your triglycerides are 336 and your goal is <150  Start prescription fish oil (icosapent ethyl). Take 2 capsules twice daily with food. This will lower your triglycerides by 30%.  Both of these medications have great data to reduce the incidence of heart attack and stroke.   We will plan to recheck your cholesterol 8 weeks on 12/14/19, Come anytime after 7:30 am for fasting labs.

## 2019-10-19 NOTE — Telephone Encounter (Signed)
Referral for Cardiac Rehab faxed to [facility]. Confirmation received.

## 2019-10-19 NOTE — Progress Notes (Signed)
Patient ID: [name]                 DOB: April 21, 1956                    MRN: [id number]     HPI: [name] is a 64 y.o. male patient referred to lipid clinic by Dr. Aundra Dubin. PMH is significant for CAD with PCI to [place] in 2012 and DES to LCx in 4098, HTN, systolic HF, VT, OSA, J1BJ. Last cath in 5/20 showed a chronically occluded RCA with collaterals and stable stents in the LCx system. No intervention made. He has ischemic cardiomyopathy with EF 35-40% on 2/21. He has a Medtronic ICD and is on amiodarone for VT. He has significant OSA but has not been able to tolerate CPAP with a facemask. Patient was last seen by Dr. Aundra Dubin on 10/17/19 where he expressed interest in starting a PCSK9 inhibitor.  Patient arrives today for initial visit. He is not currently on cholesterol-lowering medications and has not tolerated statins in the past. He had trouble walking when he took statins due to the myalgias. Symptoms resolved when he took statin therapy. He remains interested in starting PCSK9 inhibitor.  Insurance: [place] ($30/mo) - [place] approved through 10/18/20 - copay card brings price down to $25 - [employer] ($5/mo for generic) - [place] approved through 10/19/22  Current Medications: none  Intolerances: rosuvastatin 5mg  3 times per week (myalgias), simvastatin 20 mg daily (myalgias), atorvastatin (myalgias), Lovaza 2g BID, Niaspan (headache)  Risk Factors: multi-vessel CAD with multiple stents, HTN, T2DM, OSA, former smoker, strong family history  LDL goal: 55mg /dL  Diet: Likes to eat bread and sugary foods (I.e. cookie). Yesterday, he ate a piece of [place] and a pear for breakfast, salmon patty with pinto beans and slaw for lunch, and salmon for dinner. Drinks water and unsweetened tea.  Exercise: Stays active by going outside to garden and farm, has goats that keep him active  Family History: Heart attack in father (42 years) and brother (92 years)  Social History:  Former smoker (quit 2007), 1.5 PPD, 52.5 pack-years. No alcohol use.   Labs: 12/2018: TC 188, TG 336, HDL 33, LDL 88 12/2017: TC 127, TG 128, HDL 33, LDL 68 11/2012: TC 149, TG 139, HDL 31, LDL 90 (rosuvastatin, Lovaza)   Past Medical History:  Diagnosis Date  . ASCVD (arteriosclerotic cardiovascular disease)    a. MI 2004, did not seek care at that time. b. s/p PTCA/BMS to OM1 04/2011 (known totally occluded RCA with L-R collaterals and failed angioplasty on that vessel). c.)  Cath 2019 chronically occluded right coronary artery with collateralization.  90% circumflex stenosis treated with a DES.  Marland Kitchen Chronic bronchitis ([place])   . HTN (hypertension)   . Hyperlipidemia   . Ischemic cardiomyopathy    EF 30%.    . OSA (obstructive sleep apnea) 05/24/11  . Restless leg syndrome 06/25/2011  . Type 2 diabetes mellitus ([place])     Current Outpatient Medications on File Prior to Visit  Medication Sig Dispense Refill  . acetaminophen (TYLENOL) 500 MG tablet Take 500 mg by mouth every 6 (six) hours as needed (for headaches.).    Marland Kitchen albuterol (PROVENTIL HFA;VENTOLIN HFA) 108 (90 Base) MCG/ACT inhaler Inhale 1-2 puffs into the lungs every 6 (six) hours as needed for wheezing or shortness of breath.    Marland Kitchen amiodarone (PACERONE) 200 MG tablet Take 1 tablet (200 mg total)  by mouth daily. 90 tablet 3  . aspirin 81 MG chewable tablet Chew 1 tablet (81 mg total) by mouth daily.    . carvedilol (COREG) 6.25 MG tablet Take 1 tablet (6.25 mg total) by mouth 2 (two) times daily with a meal. 180 tablet 3  . CINNAMON PO Take 1,000 mg by mouth daily.    . clopidogrel (PLAVIX) 75 MG tablet TAKE 1 TABLET BY MOUTH EVERY DAY 90 tablet 3  . Coenzyme Q10 (CO Q 10) 100 MG CAPS Take 100 mg by mouth daily.    . furosemide (LASIX) 40 MG tablet Take 1 tablet (40 mg total) by mouth 2 (two) times daily. Take 1 tablet daily 180 tablet 3  . JARDIANCE 10 MG TABS tablet Take 10 mg by mouth daily.    . magnesium oxide (MAG-OX) 400 MG  tablet Take 400 mg by mouth 2 (two) times daily.    . metFORMIN (GLUCOPHAGE) 1000 MG tablet Take 1,000 mg by mouth 2 (two) times daily.    . nitroGLYCERIN (NITROSTAT) 0.4 MG SL tablet Place 1 tablet (0.4 mg total) under the tongue every 5 (five) minutes as needed for chest pain. 30 tablet 1  . pantoprazole (PROTONIX) 40 MG tablet Take 1 tablet (40 mg total) by mouth every evening. (Patient taking differently: Take 40 mg by mouth daily. ) 30 tablet 0  . pramipexole (MIRAPEX) 1 MG tablet Take 1 mg by mouth 2 (two) times a day.     . sacubitril-valsartan (ENTRESTO) 97-103 MG Take 1 tablet by mouth 2 (two) times daily. 180 tablet 3  . spironolactone (ALDACTONE) 25 MG tablet Take 1 tablet (25 mg total) by mouth daily. 90 tablet 3  . TOUJEO SOLOSTAR 300 UNIT/ML SOPN Inject 20 Units into the skin daily.     No current facility-administered medications on file prior to visit.    Allergies  Allergen Reactions  . Brilinta [Ticagrelor] Other (See Comments)    Intolerable dyspnea-changed to Plavix  . Codeine Other (See Comments)    Lightheaded, vision issues  . Lipitor [Atorvastatin Calcium] Other (See Comments)    Muscle pain  . Niaspan [Niacin Er] Other (See Comments)    Head hurts    Assessment/Plan:  1. Hyperlipidemia - LDL is above goal of <55. Start Praluent 75 mg injections every two weeks (Praluent preferred on formulary, Repatha not covered). Triglycerides are also above goal of <150. Start Vascepa (icosapent ethyl) 2g twice daily. Prior authorizations have been approved and new prescriptions have been sent to pharmacy. Total cost for both medications will be $30 which pt is ok with. Encouraged patient to remain active and to limit carbohydrate/sugar intake. Will check fasting lipid panel and LFTs on 12/14/19.  Tama Headings, PharmD PGY1 Pharmacy Resident  Megan E. Supple, PharmD, BCACP, CPP [facility] [address] [place], [place] [address] Phone: [phone]; Fax: [phone] 10/19/2019 3:57 PM

## 2019-10-21 ENCOUNTER — Other Ambulatory Visit (HOSPITAL_COMMUNITY)
Admission: RE | Admit: 2019-10-21 | Discharge: 2019-10-21 | Disposition: A | Discharge status: Home / Self Care | Payer: BC Managed Care – PPO | Source: Ambulatory Visit | Attending: Cardiology | Admitting: Cardiology

## 2019-10-21 DIAGNOSIS — E785 Hyperlipidemia, unspecified: Secondary | ICD-10-CM | POA: Diagnosis not present

## 2019-10-21 DIAGNOSIS — Z20822 Contact with and (suspected) exposure to covid-19: Secondary | ICD-10-CM | POA: Insufficient documentation

## 2019-10-21 LAB — SARS CORONAVIRUS 2 (TAT 6-24 HRS): SARS Coronavirus 2: NEGATIVE

## 2019-10-25 ENCOUNTER — Ambulatory Visit (INDEPENDENT_AMBULATORY_CARE_PROVIDER_SITE_OTHER): Payer: BC Managed Care – PPO | Admitting: *Deleted

## 2019-10-25 ENCOUNTER — Other Ambulatory Visit: Payer: Self-pay

## 2019-10-25 ENCOUNTER — Ambulatory Visit (HOSPITAL_COMMUNITY): Payer: BC Managed Care – PPO | Attending: Cardiology

## 2019-10-25 DIAGNOSIS — I255 Ischemic cardiomyopathy: Secondary | ICD-10-CM | POA: Diagnosis not present

## 2019-10-25 DIAGNOSIS — I5023 Acute on chronic systolic (congestive) heart failure: Secondary | ICD-10-CM | POA: Diagnosis present

## 2019-10-25 LAB — CUP PACEART REMOTE DEVICE CHECK
Battery Remaining Longevity: 127 mo
Battery Voltage: 3.01 V
Brady Statistic RV Percent Paced: 0.1 %
Date Time Interrogation Session: 20210309022604
HighPow Impedance: 85 Ohm
Implantable Lead Implant Date: 20191202
Implantable Lead Location: 753860
Implantable Pulse Generator Implant Date: 20191202
Lead Channel Impedance Value: 285 Ohm
Lead Channel Impedance Value: 380 Ohm
Lead Channel Pacing Threshold Amplitude: 0.875 V
Lead Channel Pacing Threshold Pulse Width: 0.4 ms
Lead Channel Sensing Intrinsic Amplitude: 2.375 mV
Lead Channel Sensing Intrinsic Amplitude: 2.375 mV
Lead Channel Setting Pacing Amplitude: 2.5 V
Lead Channel Setting Pacing Pulse Width: 0.4 ms
Lead Channel Setting Sensing Sensitivity: 0.3 mV

## 2019-10-26 NOTE — Progress Notes (Signed)
PPM Remote  

## 2019-11-01 ENCOUNTER — Telehealth (HOSPITAL_COMMUNITY): Payer: Self-pay

## 2019-11-01 DIAGNOSIS — I2782 Chronic pulmonary embolism: Secondary | ICD-10-CM

## 2019-11-01 NOTE — Telephone Encounter (Signed)
-----   Message from Laurey Morale, MD sent at 10/30/2019 11:25 PM EDT ----- Submaximal test.  Moderate HF limitation. Also limited by body habitus.  There is some concern for possible chronic PE, would arrange for V/Q scanning to look for chronic PE.

## 2019-11-01 NOTE — Telephone Encounter (Signed)
Pt aware of results. VQ scan ordered. Verbalized understanding

## 2019-11-03 ENCOUNTER — Telehealth (HOSPITAL_COMMUNITY): Payer: Self-pay | Admitting: Vascular Surgery

## 2019-11-03 NOTE — Telephone Encounter (Signed)
Left pt message giving VQ scan appt 11/08/19 , asked pt to call back to confirm appt

## 2019-11-08 ENCOUNTER — Other Ambulatory Visit: Payer: Self-pay

## 2019-11-08 ENCOUNTER — Encounter (HOSPITAL_COMMUNITY)
Admission: RE | Admit: 2019-11-08 | Discharge: 2019-11-08 | Disposition: A | Discharge status: Home / Self Care | Payer: BC Managed Care – PPO | Source: Ambulatory Visit | Attending: Cardiology | Admitting: Cardiology

## 2019-11-08 ENCOUNTER — Ambulatory Visit (HOSPITAL_COMMUNITY)
Admission: RE | Admit: 2019-11-08 | Discharge: 2019-11-08 | Disposition: A | Discharge status: Home / Self Care | Payer: BC Managed Care – PPO | Source: Ambulatory Visit | Attending: Cardiology | Admitting: Cardiology

## 2019-11-08 ENCOUNTER — Other Ambulatory Visit (HOSPITAL_COMMUNITY): Payer: Self-pay | Admitting: Cardiology

## 2019-11-08 DIAGNOSIS — I2782 Chronic pulmonary embolism: Secondary | ICD-10-CM

## 2019-11-15 ENCOUNTER — Other Ambulatory Visit: Payer: Self-pay

## 2019-11-22 ENCOUNTER — Ambulatory Visit (HOSPITAL_COMMUNITY)
Admission: RE | Admit: 2019-11-22 | Discharge: 2019-11-22 | Disposition: A | Discharge status: Home / Self Care | Payer: BC Managed Care – PPO | Source: Ambulatory Visit | Attending: Cardiology | Admitting: Cardiology

## 2019-11-22 ENCOUNTER — Encounter (HOSPITAL_COMMUNITY): Payer: Self-pay | Admitting: Cardiology

## 2019-11-22 ENCOUNTER — Other Ambulatory Visit: Payer: Self-pay

## 2019-11-22 ENCOUNTER — Encounter (HOSPITAL_COMMUNITY): Payer: BC Managed Care – PPO | Admitting: Cardiology

## 2019-11-22 VITALS — BP 100/70 | HR 59 | Wt 215.0 lb

## 2019-11-22 DIAGNOSIS — I493 Ventricular premature depolarization: Secondary | ICD-10-CM

## 2019-11-22 DIAGNOSIS — Z8249 Family history of ischemic heart disease and other diseases of the circulatory system: Secondary | ICD-10-CM | POA: Diagnosis not present

## 2019-11-22 DIAGNOSIS — Z7902 Long term (current) use of antithrombotics/antiplatelets: Secondary | ICD-10-CM | POA: Insufficient documentation

## 2019-11-22 DIAGNOSIS — I255 Ischemic cardiomyopathy: Secondary | ICD-10-CM | POA: Diagnosis not present

## 2019-11-22 DIAGNOSIS — I110 Hypertensive heart disease with heart failure: Secondary | ICD-10-CM | POA: Insufficient documentation

## 2019-11-22 DIAGNOSIS — I5022 Chronic systolic (congestive) heart failure: Secondary | ICD-10-CM | POA: Diagnosis not present

## 2019-11-22 DIAGNOSIS — Z794 Long term (current) use of insulin: Secondary | ICD-10-CM | POA: Diagnosis not present

## 2019-11-22 DIAGNOSIS — Z87891 Personal history of nicotine dependence: Secondary | ICD-10-CM | POA: Insufficient documentation

## 2019-11-22 DIAGNOSIS — E119 Type 2 diabetes mellitus without complications: Secondary | ICD-10-CM | POA: Diagnosis not present

## 2019-11-22 DIAGNOSIS — J849 Interstitial pulmonary disease, unspecified: Secondary | ICD-10-CM | POA: Diagnosis not present

## 2019-11-22 DIAGNOSIS — Z7982 Long term (current) use of aspirin: Secondary | ICD-10-CM | POA: Diagnosis not present

## 2019-11-22 DIAGNOSIS — E785 Hyperlipidemia, unspecified: Secondary | ICD-10-CM | POA: Diagnosis not present

## 2019-11-22 DIAGNOSIS — Z79899 Other long term (current) drug therapy: Secondary | ICD-10-CM | POA: Diagnosis not present

## 2019-11-22 DIAGNOSIS — I2510 Atherosclerotic heart disease of native coronary artery without angina pectoris: Secondary | ICD-10-CM | POA: Insufficient documentation

## 2019-11-22 DIAGNOSIS — G4733 Obstructive sleep apnea (adult) (pediatric): Secondary | ICD-10-CM | POA: Insufficient documentation

## 2019-11-22 DIAGNOSIS — Z7901 Long term (current) use of anticoagulants: Secondary | ICD-10-CM | POA: Diagnosis not present

## 2019-11-22 DIAGNOSIS — Z955 Presence of coronary angioplasty implant and graft: Secondary | ICD-10-CM | POA: Diagnosis not present

## 2019-11-22 NOTE — Patient Instructions (Signed)
You have been ordered for High Resolution Chest CT.  You will be called to scheduled this appointment. IF you do not receive a call, please call us to help facilitate that.    You have been referred to Dr Isaiah Serge with Pulmonary.  His office staff will call you to schedule your appointment.    Please have labs done at the end of the month. A prescription have been provided for a basic metabolic, liver function panel and lipids  Your physician recommends that you schedule a follow-up appointment in: 3 months with Dr Shirlee Latch.    Please call office at [phone] option 2 if you have any questions or concerns.   At the [facility], you and your health needs are our priority. As part of our continuing mission to provide you with exceptional heart care, we have created designated Provider Care Teams. These Care Teams include your primary Cardiologist (physician) and Advanced Practice Providers (APPs- Physician Assistants and Nurse Practitioners) who all work together to provide you with the care you need, when you need it.   You may see any of the following providers on your designated Care Team at your next follow up: Marland Kitchen Dr Arvilla Meres . Dr Marca Ancona . Tonye Becket, NP . Robbie Lis, PA . Karle Plumber, PharmD   Please be sure to bring in all your medications bottles to every appointment.

## 2019-11-22 NOTE — Progress Notes (Signed)
PCP: Hal Morales, NP Cardiology: Dr. Antoine Poche HF Cardiology: Dr. Shirlee Latch  64 y.o. with history of CAD, ischemic cardiomyopathy/chronic systolic CHF, OSA, and VT was referred by Dr. Antoine Poche for evaluation of CHF.  Patient has a long history of CAD (see PMH below).  Last cath in 5/20 showed a chronically occluded RCA with collaterals and stable stents in the LCx system, no intervention.  He has had an ischemic cardiomyopathy with EF 20-25% in 5/20, and actually up to 35-40% on 2/21 echo (I reviewed images today).  He has a Medtronic ICD and is on amiodarone for VT. He has significant OSA but has not been able to tolerate CPAP with a facemask.    CPX was done in 3/21, study was submaximal with moderate HF limitation but also functional limitation from pulmonary restriction and exertional hypoxemia.  V/Q scan was done and was not suggestive of chronic PE.  Zio patch in 3/21 showed 2% PVCs.   He returns for followup of CHF.  He has been doing well post-Lasix increase.  Weight is down 6 lbs.  Breathing is significantly improved.  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  He is not short of breath unless he is doing heavy lifting.  No orthopnea/PND.  No chest pain.  No palpitations.  No lightheadedness.   Labs (9/20): K 4.5, creatinine 1.08 Labs (3/21): hgb 16.9, K 4.5, creatinine 1.3, AST 34, ALT 54 (mildly elevated), LDL 51, TGs 373, TSH normal.   PMH: 1. CAD:  - PCI to OM1 in 2012.  - DES to LCx in 5/19.  - LHC (5/20): 50% proximal LAD, 95% proximal D1, patent LCx and OM1 stents, total occlusion of RCA with collaterals.  2. Chronic systolic CHF: Ischemic cardiomyopathy. Medtronic ICD.  - RHC (5/20): mean RA 3, PA 25/5, mean PCWP 6, CI 1.86 - Echo (5/20): EF 20-25% - Echo (2/21): EF 35-40%, mild LV dilation, RV low normal function.  - CPX (3/21): peak VO2 17.6, VE/VCO2 slope 43, RER 0.96 => submaximal, moderate HF limitation but  also noted is pulmonary restriction with exertional hypoxemia.  3. VT: Now on amiodarone.  4. OSA: Has not tolerate CPAP.  5. HTN 6. Type 2 diabetes 7. Hyperlipidemia: Has not tolerated statins.  8. PVCs - Zio patch (3/21) with 2% PVCs  SH: Married, lives in [place], retired, quit smoking in 2007.   Family History  Problem Relation Age of Onset  . Lymphoma Mother   . Heart attack Father 23  . Heart attack Brother 51   ROS: All systems reviewed and negative except as per HPI.   Current Outpatient Medications  Medication Sig Dispense Refill  . acetaminophen (TYLENOL) 500 MG tablet Take 500 mg by mouth every 6 (six) hours as needed (for headaches.).    Marland Kitchen Alirocumab (PRALUENT) 75 MG/ML SOAJ Inject 75 mg into the skin every 14 (fourteen) days. 2 pen 11  . amiodarone (PACERONE) 200 MG tablet Take 1 tablet (200 mg total) by mouth daily. 90 tablet 3  . aspirin 81 MG chewable tablet Chew 1 tablet (81 mg total) by mouth daily.    . carvedilol (COREG) 6.25 MG tablet Take 1 tablet (6.25 mg total) by mouth 2 (two) times daily with a meal. 180 tablet 3  . CINNAMON PO Take 1,000 mg by mouth daily.    . clopidogrel (PLAVIX) 75 MG tablet TAKE 1 TABLET BY MOUTH  EVERY DAY 90 tablet 3  . Coenzyme Q10 (CO Q 10) 100 MG CAPS Take 100 mg by mouth daily.    . furosemide (LASIX) 40 MG tablet Take 1 tablet (40 mg total) by mouth 2 (two) times daily. Take 1 tablet daily 180 tablet 3  . icosapent Ethyl (VASCEPA) 1 g capsule Take 1 g by mouth 2 (two) times daily.    Marland Kitchen JARDIANCE 10 MG TABS tablet Take 10 mg by mouth daily.    . magnesium oxide (MAG-OX) 400 MG tablet Take 400 mg by mouth 2 (two) times daily.    . metFORMIN (GLUCOPHAGE) 1000 MG tablet Take 1,000 mg by mouth 2 (two) times daily.    . nitroGLYCERIN (NITROSTAT) 0.4 MG SL tablet Place 1 tablet (0.4 mg total) under the tongue every 5 (five) minutes as needed for chest pain. 30 tablet 1  . pantoprazole (PROTONIX) 40 MG tablet Take 40 mg by mouth daily.     . pramipexole (MIRAPEX) 1 MG tablet Take 0.5 mg by mouth 4 (four) times daily.     . sacubitril-valsartan (ENTRESTO) 97-103 MG Take 1 tablet by mouth 2 (two) times daily. 180 tablet 3  . spironolactone (ALDACTONE) 25 MG tablet Take 1 tablet (25 mg total) by mouth daily. 90 tablet 3  . TOUJEO SOLOSTAR 300 UNIT/ML SOPN Inject 30 Units into the skin at bedtime.      No current facility-administered medications for this encounter.   BP 100/70   Pulse (!) 59   Wt 97.5 kg (215 lb)   SpO2 96%   BMI 30.85 kg/m  General: NAD Neck: No JVD, no thyromegaly or thyroid nodule.  Lungs: Clear to auscultation bilaterally with normal respiratory effort. CV: Nondisplaced PMI.  Heart regular S1/S2, no S3/S4, no murmur.  No peripheral edema.  No carotid bruit.  Normal pedal pulses.  Abdomen: Soft, nontender, no hepatosplenomegaly, no distention.  Skin: Intact without lesions or rashes.  Neurologic: Alert and oriented x 3.  Psych: Normal affect. Extremities: No clubbing or cyanosis.  HEENT: Normal.   Assessment/Plan: 1. CAD: Cath in 5/20 showed chronically occluded RCA with patent LCx system stents.  No interventional target.  No chest pain recently.  He does not take statins due to myalgias.  - Continue ASA 81 daily and Plavix 75 mg daily. Could consider changing from Plavix to rivaroxaban 2.5 mg bid in the future (would be good candidate for COMPASS regimen).  - Continue Praluent, check lipids in a few weeks.   2. Chronic systolic CHF: Ischemic cardiomyopathy.  Medtronic ICD.  Narrow QRS so not CRT candidate.  Echo in 5/20 with EF 20-25%, but echo in 2/21 looked better, with EF 35-40%.  CPX in 3/21 with moderate HF limitation.  Currently, NYHA class II symptoms, improved since increasing Lasix and appears euvolemic on exam.   - Continue Lasix 40 mg bid.  Recent BMET stable.   - Continue Entresto 97/103 bid.  - Continue spironolactone 25 mg daily.  - Continue Coreg 6.25 mg bid.  With mild bradycardia  and fatigue would hold off on increasing for now.  - Continue empagliflozin.  3. OSA: Untreated, sounds severe based on his wife's description.  He has not been able to tolerate CPAP in the past and is not willing to try again.  4. PVCs: History of PVCs, now on amiodarone. Most recent Zio patch in 3/21 with only 2% PVCs.  - ALT mildly elevated, AST normal on recent labs. Need to repeat LFTs on amiodarone  in a couple of weeks.  - Recent TSH was normal.  5. Pulmonary restriction: Noticed on PFTs with CPX.  ?Interstitial lung disease.  Given amiodarone use, must have some concern for amiodarone pulmonary toxicity.  - Arrange for high resolution CT chest to look for evidence of amiodarone toxicity or other form of ILD.  - I will refer for pulmonary evaluation.   Followup in 3 months.   [name] 11/22/2019

## 2019-12-13 ENCOUNTER — Other Ambulatory Visit: Payer: Self-pay

## 2019-12-13 ENCOUNTER — Ambulatory Visit (HOSPITAL_COMMUNITY)
Admission: RE | Admit: 2019-12-13 | Discharge: 2019-12-13 | Disposition: A | Discharge status: Home / Self Care | Payer: BC Managed Care – PPO | Source: Ambulatory Visit | Attending: Cardiology | Admitting: Cardiology

## 2019-12-13 DIAGNOSIS — J849 Interstitial pulmonary disease, unspecified: Secondary | ICD-10-CM | POA: Insufficient documentation

## 2019-12-14 ENCOUNTER — Other Ambulatory Visit: Payer: BC Managed Care – PPO

## 2019-12-15 ENCOUNTER — Telehealth (HOSPITAL_COMMUNITY): Payer: Self-pay

## 2019-12-15 DIAGNOSIS — J849 Interstitial pulmonary disease, unspecified: Secondary | ICD-10-CM

## 2019-12-15 NOTE — Telephone Encounter (Signed)
Orders Placed This Encounter  Procedures  . Sed Rate (ESR)    Standing Status:   Future    Standing Expiration Date:   12/14/2020    Order Specific Question:   Release to patient    Answer:   Immediate  . Ambulatory referral to Pulmonology    Referral Priority:   Urgent    Referral Type:   Consultation    Referral Reason:   Specialty Services Required    Requested Specialty:   Pulmonary Disease    Number of Visits Requested:   1

## 2019-12-15 NOTE — Telephone Encounter (Signed)
-----  Message from Larey Dresser, MD sent at 12/14/2019  4:29 PM EDT ----- Concern for interstitial lung disease.  ?if this is related to amiodarone.  Would like him seen by pulmonary ASAP.  Please have him get ESR drawn.

## 2019-12-21 ENCOUNTER — Emergency Department (HOSPITAL_COMMUNITY): Payer: BC Managed Care – PPO

## 2019-12-21 ENCOUNTER — Inpatient Hospital Stay (HOSPITAL_COMMUNITY)
Admission: EM | Admit: 2019-12-21 | Discharge: 2019-12-26 | DRG: 871 | Disposition: A | Discharge status: Home / Self Care | Payer: BC Managed Care – PPO | Attending: Internal Medicine | Admitting: Internal Medicine

## 2019-12-21 DIAGNOSIS — Z79899 Other long term (current) drug therapy: Secondary | ICD-10-CM

## 2019-12-21 DIAGNOSIS — I110 Hypertensive heart disease with heart failure: Secondary | ICD-10-CM | POA: Diagnosis present

## 2019-12-21 DIAGNOSIS — E669 Obesity, unspecified: Secondary | ICD-10-CM | POA: Diagnosis present

## 2019-12-21 DIAGNOSIS — J84112 Idiopathic pulmonary fibrosis: Secondary | ICD-10-CM | POA: Diagnosis present

## 2019-12-21 DIAGNOSIS — J9601 Acute respiratory failure with hypoxia: Secondary | ICD-10-CM | POA: Diagnosis not present

## 2019-12-21 DIAGNOSIS — Z7982 Long term (current) use of aspirin: Secondary | ICD-10-CM

## 2019-12-21 DIAGNOSIS — Z7952 Long term (current) use of systemic steroids: Secondary | ICD-10-CM

## 2019-12-21 DIAGNOSIS — J849 Interstitial pulmonary disease, unspecified: Secondary | ICD-10-CM | POA: Diagnosis not present

## 2019-12-21 DIAGNOSIS — K219 Gastro-esophageal reflux disease without esophagitis: Secondary | ICD-10-CM | POA: Diagnosis present

## 2019-12-21 DIAGNOSIS — Z955 Presence of coronary angioplasty implant and graft: Secondary | ICD-10-CM

## 2019-12-21 DIAGNOSIS — R0902 Hypoxemia: Secondary | ICD-10-CM

## 2019-12-21 DIAGNOSIS — Z77090 Contact with and (suspected) exposure to asbestos: Secondary | ICD-10-CM | POA: Diagnosis present

## 2019-12-21 DIAGNOSIS — A419 Sepsis, unspecified organism: Principal | ICD-10-CM

## 2019-12-21 DIAGNOSIS — I5042 Chronic combined systolic (congestive) and diastolic (congestive) heart failure: Secondary | ICD-10-CM | POA: Diagnosis present

## 2019-12-21 DIAGNOSIS — G2581 Restless legs syndrome: Secondary | ICD-10-CM | POA: Diagnosis present

## 2019-12-21 DIAGNOSIS — E119 Type 2 diabetes mellitus without complications: Secondary | ICD-10-CM | POA: Diagnosis present

## 2019-12-21 DIAGNOSIS — Z20822 Contact with and (suspected) exposure to covid-19: Secondary | ICD-10-CM | POA: Diagnosis present

## 2019-12-21 DIAGNOSIS — N179 Acute kidney failure, unspecified: Secondary | ICD-10-CM | POA: Diagnosis present

## 2019-12-21 DIAGNOSIS — I252 Old myocardial infarction: Secondary | ICD-10-CM

## 2019-12-21 DIAGNOSIS — K760 Fatty (change of) liver, not elsewhere classified: Secondary | ICD-10-CM | POA: Diagnosis present

## 2019-12-21 DIAGNOSIS — I2510 Atherosclerotic heart disease of native coronary artery without angina pectoris: Secondary | ICD-10-CM | POA: Diagnosis present

## 2019-12-21 DIAGNOSIS — Z95810 Presence of automatic (implantable) cardiac defibrillator: Secondary | ICD-10-CM

## 2019-12-21 DIAGNOSIS — E782 Mixed hyperlipidemia: Secondary | ICD-10-CM | POA: Diagnosis present

## 2019-12-21 DIAGNOSIS — Z87891 Personal history of nicotine dependence: Secondary | ICD-10-CM

## 2019-12-21 DIAGNOSIS — J189 Pneumonia, unspecified organism: Secondary | ICD-10-CM | POA: Diagnosis not present

## 2019-12-21 DIAGNOSIS — G4733 Obstructive sleep apnea (adult) (pediatric): Secondary | ICD-10-CM | POA: Diagnosis present

## 2019-12-21 DIAGNOSIS — Z7984 Long term (current) use of oral hypoglycemic drugs: Secondary | ICD-10-CM

## 2019-12-21 DIAGNOSIS — I255 Ischemic cardiomyopathy: Secondary | ICD-10-CM | POA: Diagnosis present

## 2019-12-21 DIAGNOSIS — Z7902 Long term (current) use of antithrombotics/antiplatelets: Secondary | ICD-10-CM

## 2019-12-21 DIAGNOSIS — Z6831 Body mass index (BMI) 31.0-31.9, adult: Secondary | ICD-10-CM | POA: Diagnosis not present

## 2019-12-21 DIAGNOSIS — Z9119 Patient's noncompliance with other medical treatment and regimen: Secondary | ICD-10-CM

## 2019-12-21 LAB — BASIC METABOLIC PANEL
Anion gap: 15 (ref 5–15)
BUN: 21 mg/dL (ref 8–23)
CO2: 19 mmol/L — ABNORMAL LOW (ref 22–32)
Calcium: 8.9 mg/dL (ref 8.9–10.3)
Chloride: 98 mmol/L (ref 98–111)
Creatinine, Ser: 1.33 mg/dL — ABNORMAL HIGH (ref 0.61–1.24)
GFR calc Af Amer: >60 mL/min (ref >60)
GFR calc non Af Amer: 56 mL/min — ABNORMAL LOW (ref >60)
Glucose, Bld: 238 mg/dL — ABNORMAL HIGH (ref 70–99)
Potassium: 4.2 mmol/L (ref 3.5–5.1)
Sodium: 132 mmol/L — ABNORMAL LOW (ref 135–145)

## 2019-12-21 LAB — CBC
HCT: 45.9 % (ref 39.0–52.0)
Hemoglobin: 15.4 g/dL (ref 13.0–17.0)
MCH: 32.2 pg (ref 26.0–34.0)
MCHC: 33.6 g/dL (ref 30.0–36.0)
MCV: 96 fL (ref 80.0–100.0)
Platelets: 124 10*3/uL — ABNORMAL LOW (ref 150–400)
RBC: 4.78 MIL/uL (ref 4.22–5.81)
RDW: 13.2 % (ref 11.5–15.5)
WBC: 13.9 10*3/uL — ABNORMAL HIGH (ref 4.0–10.5)
nRBC: 0 % (ref 0.0–0.2)

## 2019-12-21 LAB — TROPONIN I (HIGH SENSITIVITY)
Troponin I (High Sensitivity): 12 ng/L (ref <18)
Troponin I (High Sensitivity): 16 ng/L (ref <18)

## 2019-12-21 LAB — RESPIRATORY PANEL BY RT PCR (FLU A&B, COVID)
Influenza A by PCR: NEGATIVE
Influenza B by PCR: NEGATIVE
SARS Coronavirus 2 by RT PCR: NEGATIVE

## 2019-12-21 LAB — LACTIC ACID, PLASMA
Lactic Acid, Venous: 2 mmol/L (ref 0.5–1.9)
Lactic Acid, Venous: 1.2 mmol/L (ref 0.5–1.9)

## 2019-12-21 MED ORDER — SODIUM CHLORIDE 0.9 % IV SOLN
1.0000 g | Freq: Once | INTRAVENOUS | Status: AC
  Administered 2019-12-21: 1 g via INTRAVENOUS
  Filled 2019-12-21: qty 10

## 2019-12-21 MED ORDER — SODIUM CHLORIDE 0.9 % IV SOLN
1.0000 g | INTRAVENOUS | Status: DC
  Administered 2019-12-22: 1 g via INTRAVENOUS
  Filled 2019-12-21: qty 1

## 2019-12-21 MED ORDER — ACETAMINOPHEN 500 MG PO TABS
1000.0000 mg | ORAL_TABLET | Freq: Once | ORAL | Status: AC
  Administered 2019-12-21: 1000 mg via ORAL
  Filled 2019-12-21: qty 2

## 2019-12-21 MED ORDER — IBUPROFEN 800 MG PO TABS
800.0000 mg | ORAL_TABLET | Freq: Once | ORAL | Status: AC
  Administered 2019-12-21: 800 mg via ORAL
  Filled 2019-12-21: qty 1

## 2019-12-21 MED ORDER — SODIUM CHLORIDE 0.9 % IV BOLUS
1000.0000 mL | Freq: Once | INTRAVENOUS | Status: AC
  Administered 2019-12-21: 19:00:00 1000 mL via INTRAVENOUS

## 2019-12-21 MED ORDER — SODIUM CHLORIDE 0.9% FLUSH: 3.0000 mL | Freq: Once | INTRAVENOUS | Status: DC

## 2019-12-21 MED ORDER — PRAMIPEXOLE DIHYDROCHLORIDE 0.25 MG PO TABS
0.5000 mg | ORAL_TABLET | Freq: Four times a day (QID) | ORAL | Status: DC
  Administered 2019-12-21: 0.5 mg via ORAL
  Filled 2019-12-21: qty 2
  Filled 2019-12-21: qty 1
  Filled 2019-12-21: qty 2

## 2019-12-21 MED ORDER — SODIUM CHLORIDE 0.9 % IV SOLN
500.0000 mg | Freq: Once | INTRAVENOUS | Status: AC
  Administered 2019-12-21: 500 mg via INTRAVENOUS
  Filled 2019-12-21: qty 500

## 2019-12-21 MED ORDER — SODIUM CHLORIDE 0.9 % IV SOLN
500.0000 mg | INTRAVENOUS | Status: DC
  Administered 2019-12-22 – 2019-12-25 (×4): 500 mg via INTRAVENOUS
  Filled 2019-12-21 (×5): qty 500

## 2019-12-21 NOTE — ED Triage Notes (Signed)
Pt reports not feeling well for several days. Pt reports oxygen levels dropping down to 79 at some points. Pt has received both covid vaccinations. Pt in NAD.

## 2019-12-21 NOTE — ED Notes (Signed)
Pt ambulated to restroom without assistance.

## 2019-12-21 NOTE — H&P (Addendum)
History and Physical    [name] VQQ:[id number] DOB: 1956-01-19 DOA: 12/21/2019  PCP: Hal Morales, NP  Patient coming from: Home  I have personally briefly reviewed patient's old medical records in [facility]  Chief Complaint: SOB/Cough  HPI: [name] is a 64 y.o. male with medical history significant of ASCVD, HTN, HLD, T2DM, ICMP/VT s/p ICD, OSA, Pulm Fibrosis and RLS who presents with SOB.  Patient reports he has been feeling unwell since Friday.  He reports he had been dealing with fatigue, weakness.  He reports he has been dealing with fevers, chills, cough and shortness of breath.  He reports cough is dry, no productive sputum.    No recent sick contacts.  He has completed vaccination for COVID.  No urinary symptoms of dysuria, frequency, urgency.  No new rashes.  No diarrhea, abdominal pain, nausea/vomiting.    He had PCP visit who prescribed prednisone for which he has taken for one day.  Review of Systems: As per HPI otherwise 10 point review of systems negative.    Past Medical History:  Diagnosis Date  . ASCVD (arteriosclerotic cardiovascular disease)    a. MI 2004, did not seek care at that time. b. s/p PTCA/BMS to OM1 04/2011 (known totally occluded RCA with L-R collaterals and failed angioplasty on that vessel). c.)  Cath 2019 chronically occluded right coronary artery with collateralization.  90% circumflex stenosis treated with a DES.  Marland Kitchen Chronic bronchitis (HCC)   . HTN (hypertension)   . Hyperlipidemia   . Ischemic cardiomyopathy    EF 30%.    . OSA (obstructive sleep apnea) 05/24/11  . Restless leg syndrome 06/25/2011  . Type 2 diabetes mellitus (HCC)     Past Surgical History:  Procedure Laterality Date  . CARDIAC CATHETERIZATION  11/24/2012   40-50% ostial LAD, 40% mid LAD, 60-70% ostial D1, 40% proximal LCx, patent OM branch stent, 99% serial proximal-mid RCA lesions, 100% mid RCA occlusion (chronic with left to right collateralization);  EF 40%, inferior wall hypokinesis.  Marland Kitchen CARDIAC CATHETERIZATION N/A 05/31/2015   Procedure: Left Heart Cath and Coronary Angiography;  Surgeon: Kathleene Hazel, MD;  Location: [facility] INVASIVE CV LAB;  Service: Cardiovascular;  Laterality: N/A;  . CORONARY STENT INTERVENTION N/A 12/25/2017   Procedure: CORONARY STENT INTERVENTION;  Surgeon: Swaziland, Peter M, MD;  Location: [facility] INVASIVE CV LAB;  Service: Cardiovascular;  Laterality: N/A;  . CORONARY STENT PLACEMENT  04/2011  . CYSTECTOMY     Spine and jaw  . ICD IMPLANT N/A 07/19/2018   Procedure: ICD IMPLANT;  Surgeon: Regan Lemming, MD;  Location: [facility] INVASIVE CV LAB;  Service: Cardiovascular;  Laterality: N/A;  . LEFT HEART CATH AND CORONARY ANGIOGRAPHY N/A 12/25/2017   Procedure: LEFT HEART CATH AND CORONARY ANGIOGRAPHY;  Surgeon: Swaziland, Peter M, MD;  Location: [facility] INVASIVE CV LAB;  Service: Cardiovascular;  Laterality: N/A;  . LEFT HEART CATHETERIZATION WITH CORONARY ANGIOGRAM N/A 11/24/2012   Procedure: LEFT HEART CATHETERIZATION WITH CORONARY ANGIOGRAM;  Surgeon: Kathleene Hazel, MD;  Location: [facility] CATH LAB;  Service: Cardiovascular;  Laterality: N/A;  . lesion removed from foot     right foot  . RIGHT/LEFT HEART CATH AND CORONARY ANGIOGRAPHY N/A 01/06/2019   Procedure: RIGHT/LEFT HEART CATH AND CORONARY ANGIOGRAPHY;  Surgeon: Runell Gess, MD;  Location: [facility] INVASIVE CV LAB;  Service: Cardiovascular;  Laterality: N/A;  . TONSILLECTOMY AND ADENOIDECTOMY       reports that he quit smoking about 14  years ago. His smoking use included cigarettes. He has a 52.50 pack-year smoking history. He has never used smokeless tobacco. He reports that he does not drink alcohol or use drugs.  Allergies  Allergen Reactions  . Brilinta [Ticagrelor] Other (See Comments)    Intolerable dyspnea-changed to Plavix  . Codeine Other (See Comments)    Lightheaded, vision issues  . Lipitor [Atorvastatin Calcium] Other (See Comments)    Muscle pain    . Niaspan [Niacin Er] Other (See Comments)    Head hurts    Family History  Problem Relation Age of Onset  . Lymphoma Mother   . Heart attack Father 47  . Heart attack Brother 51     Prior to Admission medications   Medication Sig Start Date End Date Taking? Authorizing Provider  acetaminophen (TYLENOL) 500 MG tablet Take 500 mg by mouth every 6 (six) hours as needed (for headaches.).   Yes [provider]  albuterol (VENTOLIN HFA) 108 (90 Base) MCG/ACT inhaler Inhale 1 puff into the lungs every 6 (six) hours as needed for wheezing or shortness of breath.   Yes [provider]  Alirocumab (PRALUENT) 75 MG/ML SOAJ Inject 75 mg into the skin every 14 (fourteen) days. 10/19/19  Yes Laurey Morale, MD  amiodarone (PACERONE) 200 MG tablet Take 1 tablet (200 mg total) by mouth daily. 07/18/19  Yes Rollene Rotunda, MD  aspirin 81 MG chewable tablet Chew 1 tablet (81 mg total) by mouth daily. 01/06/19  Yes Arty Baumgartner, NP  carvedilol (COREG) 6.25 MG tablet Take 1 tablet (6.25 mg total) by mouth 2 (two) times daily with a meal. 07/18/19  Yes Rollene Rotunda, MD  CINNAMON PO Take 1,000 mg by mouth daily.   Yes [provider]  clopidogrel (PLAVIX) 75 MG tablet TAKE 1 TABLET BY MOUTH EVERY DAY 07/11/19  Yes Rollene Rotunda, MD  Coenzyme Q10 (CO Q 10) 100 MG CAPS Take 100 mg by mouth daily.   Yes [provider]  furosemide (LASIX) 40 MG tablet Take 1 tablet (40 mg total) by mouth 2 (two) times daily. Take 1 tablet daily 10/17/19  Yes Laurey Morale, MD  icosapent Ethyl (VASCEPA) 1 g capsule Take 1 g by mouth 2 (two) times daily.   Yes [provider]  JARDIANCE 10 MG TABS tablet Take 10 mg by mouth daily. 10/05/17  Yes [provider]  magnesium oxide (MAG-OX) 400 MG tablet Take 400 mg by mouth 2 (two) times daily.   Yes [provider]  metFORMIN (GLUCOPHAGE) 1000 MG tablet Take 1,000 mg by mouth 2 (two) times daily. 10/05/17  Yes  [provider]  nitroGLYCERIN (NITROSTAT) 0.4 MG SL tablet Place 1 tablet (0.4 mg total) under the tongue every 5 (five) minutes as needed for chest pain. 04/20/15  Yes Rollene Rotunda, MD  pantoprazole (PROTONIX) 40 MG tablet Take 40 mg by mouth every evening.    Yes [provider]  pramipexole (MIRAPEX) 1 MG tablet Take 0.5 mg by mouth 4 (four) times daily.    Yes [provider]  predniSONE (DELTASONE) 20 MG tablet Take 20 mg by mouth 2 (two) times daily with a meal.   Yes [provider]  sacubitril-valsartan (ENTRESTO) 97-103 MG Take 1 tablet by mouth 2 (two) times daily. 07/18/19  Yes Rollene Rotunda, MD  spironolactone (ALDACTONE) 25 MG tablet Take 1 tablet (25 mg total) by mouth daily. 10/17/19  Yes Laurey Morale, MD  TOUJEO SOLOSTAR 300 UNIT/ML  SOPN Inject 20 Units into the skin at bedtime.  10/14/18  Yes [provider]    Physical Exam: Vitals:   12/21/19 1254  BP: 118/69  Pulse: 74  Resp: 18  Temp: 99.5 F (37.5 C)  TempSrc: Oral  SpO2: 92%  Weight: 98.9 kg  Height: 5\' 10"  (1.778 m)    Vitals:   12/21/19 1254  BP: 118/69  Pulse: 74  Resp: 18  Temp: 99.5 F (37.5 C)  TempSrc: Oral  SpO2: 92%  Weight: 98.9 kg  Height: 5\' 10"  (1.778 m)     Constitutional: Appears fatigued, NAD Eyes: PERRL, lids and conjunctivae normal ENMT: Mucous membranes are moist. Posterior pharynx clear of any exudate or lesions.Normal dentition.  Neck: normal, supple, no masses, no thyromegaly Respiratory: crackles appreciated posteriorly RLL Cardiovascular: Regular rate and rhythm, no murmurs / rubs / gallops. No extremity edema. 2+ pedal pulses. No carotid bruits.  Abdomen: obese, no tenderness, no masses palpated. No hepatosplenomegaly. Bowel sounds positive.  Musculoskeletal: no clubbing / cyanosis. No joint deformity upper and lower extremities. Good ROM, no contractures. Normal muscle tone.  Skin: warm to touch, no rashes Neurologic: CN  2-12 grossly intact. Sensation and strength intact. Psychiatric: Normal judgment and insight. Alert and oriented x 3. Normal mood.    Labs on Admission: I have personally reviewed following labs and imaging studies  CBC: Recent Labs  Lab 12/21/19 1304  WBC 13.9*  HGB 15.4  HCT 45.9  MCV 96.0  PLT 716*   Basic Metabolic Panel: Recent Labs  Lab 12/21/19 1304  NA 132*  K 4.2  CL 98  CO2 19*  GLUCOSE 238*  BUN 21  CREATININE 1.33*  CALCIUM 8.9   GFR: Estimated Creatinine Clearance: 66.2 mL/min (A) (by C-G formula based on SCr of 1.33 mg/dL (H)). Liver Function Tests: No results for input(s): AST, ALT, ALKPHOS, BILITOT, PROT, ALBUMIN in the last 168 hours. No results for input(s): LIPASE, AMYLASE in the last 168 hours. No results for input(s): AMMONIA in the last 168 hours. Coagulation Profile: No results for input(s): INR, PROTIME in the last 168 hours. Cardiac Enzymes: No results for input(s): CKTOTAL, CKMB, CKMBINDEX, TROPONINI in the last 168 hours. BNP (last 3 results) Recent Labs    03/31/19 1557  PROBNP 55   HbA1C: No results for input(s): HGBA1C in the last 72 hours. CBG: No results for input(s): GLUCAP in the last 168 hours. Lipid Profile: No results for input(s): CHOL, HDL, LDLCALC, TRIG, CHOLHDL, LDLDIRECT in the last 72 hours. Thyroid Function Tests: No results for input(s): TSH, T4TOTAL, FREET4, T3FREE, THYROIDAB in the last 72 hours. Anemia Panel: No results for input(s): VITAMINB12, FOLATE, FERRITIN, TIBC, IRON, RETICCTPCT in the last 72 hours. Urine analysis:    Component Value Date/Time   COLORURINE STRAW (A) 12/24/2017 1224   APPEARANCEUR CLEAR 12/24/2017 1224   LABSPEC 1.011 12/24/2017 1224   PHURINE 5.0 12/24/2017 1224   GLUCOSEU >=500 (A) 12/24/2017 1224   HGBUR NEGATIVE 12/24/2017 1224   BILIRUBINUR NEGATIVE 12/24/2017 1224   KETONESUR NEGATIVE 12/24/2017 1224   PROTEINUR NEGATIVE 12/24/2017 1224   UROBILINOGEN 1.0 08/22/2007 1224    NITRITE NEGATIVE 12/24/2017 1224   LEUKOCYTESUR NEGATIVE 12/24/2017 1224    Radiological Exams on Admission: DG Chest 2 View  Result Date: 12/21/2019 CLINICAL DATA:  Shortness of breath.  Fever. EXAM: CHEST - 2 VIEW COMPARISON:  November 08, 2019 chest radiograph; chest CT December 13, 2019 FINDINGS: There is underlying fibrotic change. There is scarring in  the upper lobe regions, better seen on CT. In comparison with prior studies, there is ill-defined airspace opacity superimposed on fibrotic type change. No similar changes of this nature elsewhere. Heart size and pulmonary vascularity are normal. Pacemaker lead is attached to the right ventricle. No adenopathy. There is degenerative change in the lower thoracic and upper lumbar regions. IMPRESSION: Airspace opacity right lower lung region, superimposed on fibrosis. Suspect a degree of pneumonia superimposed on fibrosis. Fibrotic changes elsewhere appear stable. Scarring in the upper lobes is better seen on CT than by radiography. Cardiac silhouette within normal limits. Pacemaker lead attached to right ventricle. No adenopathy appreciable by radiography. Electronically Signed   By: Bretta Bang III M.D.   On: 12/21/2019 13:24    EKG: Independently reviewed.   Assessment/Plan [name] is a 64 y.o. male with medical history significant of ASCVD, HTN, HLD, T2DM, ICMP/VT s/p ICD, OSA, Pulm Fibrosis and RLS who presents with SOB and acute hypoxemic respiratory failure 2/2 community acquired pneumonia.  # Sepsis 2/2 Community Acquired Pneumonia # Acute Hypoxemic Respiratory Failure # Idiopathic pulmonary fibrosis? - self-reported fever, leukocytosis 13.9K, low grade temp recorded here of 99.5 - currently on 2L via nasal cannula to maintain sats.  Recent CT scan high res does reveal basilar predominant fibrotic interstitial lung disease. CXR reveals airspace opacity RLL. - patient vaccinated, COVID/Influenza neg - added on procalcitonin - held  prednisone - continue ceftriaxone and azithromycin - for now given acuity of symptoms, favor treating PNA and having patient follow-up closely with pulm for ongoing management of fibrotic lung disease (work-up and further management/evaluation, continue PPI)  # CAD s/p PCI # ICM/VT s/p ICD # Chronic systolic and diastolic heart failure - continue aspirin, clopidogrel, amiodarone, carvedilol, held furosemide and spironolactone as creatinine is slightly uptrending and suspect patient is slightly dehydrated - would continue Entresto but will await renal function in AM - order if stable (patient received a dose of ibuprofen by ER as antipyretic)  # Mixed HLD - on alirocumab and icosapent ethyl  # Hepatic steatosis - weight loss  # T2DM - held home meds - continue ISS and CBG  # RLS - continue pramipexole  #GERD -continue PPI  #Obesity - BMI 31.28 - complicates all aspects of care  DVT prophylaxis: Lovenox Code Status: Full Family Communication: Wife at bedside Admission status: inpatient   Clydia Llano MD [facility] Pager [phone]  If 7PM-7AM, please contact night-coverage [email or web address] Password [email or web address]  12/21/2019, 7:40 PM

## 2019-12-21 NOTE — ED Provider Notes (Signed)
[facility] EMERGENCY DEPARTMENT Provider Note   CSN: [id number] Arrival date & time: 12/21/19  1243     History Chief Complaint  Patient presents with  . Shortness of Breath  . Fever    [name] is a 64 y.o. male.  Pt presents to the ED today with sob and cough for the past few days.  He said last night was the worse.  His O2 sats were dropping into the upper 70s.  He is having fever and chills.  Pt's last dose of his Covid vaccine was on 4/22.          Past Medical History:  Diagnosis Date  . ASCVD (arteriosclerotic cardiovascular disease)    a. MI 2004, did not seek care at that time. b. s/p PTCA/BMS to OM1 04/2011 (known totally occluded RCA with L-R collaterals and failed angioplasty on that vessel). c.)  Cath 2019 chronically occluded right coronary artery with collateralization.  90% circumflex stenosis treated with a DES.  Marland Kitchen Chronic bronchitis (HCC)   . HTN (hypertension)   . Hyperlipidemia   . Ischemic cardiomyopathy    EF 30%.    . OSA (obstructive sleep apnea) 05/24/11  . Restless leg syndrome 06/25/2011  . Type 2 diabetes mellitus [facility])     Patient Active Problem List   Diagnosis Date Noted  . ICD (implantable cardioverter-defibrillator) in place 03/31/2019  . Educated about COVID-19 virus infection 01/14/2019  . Coronary artery disease involving native coronary artery of native heart without angina pectoris 01/14/2019  . Ventricular tachyarrhythmia (HCC) 01/09/2019  . VT (ventricular tachycardia) (HCC) 01/09/2019  . Dyspnea on effort 01/04/2019  . Thrombocytopenia (HCC) 01/22/2018  . Dyspnea 01/21/2018  . Acute on chronic systolic CHF (congestive heart failure) (HCC) 12/25/2017  . Unstable angina (HCC) 12/24/2017  . Cardiomyopathy, ischemic   . Essential hypertension 11/25/2012  . Chronic bronchitis (HCC) 11/25/2012  . Insulin dependent diabetes mellitus (HCC) 11/25/2012  . Bradycardia 11/25/2012  . Restless leg syndrome  06/25/2011  . Dyslipidemia 06/05/2011  . OSA (obstructive sleep apnea) 06/05/2011  . CAD S/P percutaneous coronary angioplasty 05/09/2011    Past Surgical History:  Procedure Laterality Date  . CARDIAC CATHETERIZATION  11/24/2012   40-50% ostial LAD, 40% mid LAD, 60-70% ostial D1, 40% proximal LCx, patent OM branch stent, 99% serial proximal-mid RCA lesions, 100% mid RCA occlusion (chronic with left to right collateralization); EF 40%, inferior wall hypokinesis.  Marland Kitchen CARDIAC CATHETERIZATION N/A 05/31/2015   Procedure: Left Heart Cath and Coronary Angiography;  Surgeon: Kathleene Hazel, MD;  Location: [facility] INVASIVE CV LAB;  Service: Cardiovascular;  Laterality: N/A;  . CORONARY STENT INTERVENTION N/A 12/25/2017   Procedure: CORONARY STENT INTERVENTION;  Surgeon: Swaziland, Peter M, MD;  Location: [facility] INVASIVE CV LAB;  Service: Cardiovascular;  Laterality: N/A;  . CORONARY STENT PLACEMENT  04/2011  . CYSTECTOMY     Spine and jaw  . ICD IMPLANT N/A 07/19/2018   Procedure: ICD IMPLANT;  Surgeon: Regan Lemming, MD;  Location: [facility] INVASIVE CV LAB;  Service: Cardiovascular;  Laterality: N/A;  . LEFT HEART CATH AND CORONARY ANGIOGRAPHY N/A 12/25/2017   Procedure: LEFT HEART CATH AND CORONARY ANGIOGRAPHY;  Surgeon: Swaziland, Peter M, MD;  Location: [facility] INVASIVE CV LAB;  Service: Cardiovascular;  Laterality: N/A;  . LEFT HEART CATHETERIZATION WITH CORONARY ANGIOGRAM N/A 11/24/2012   Procedure: LEFT HEART CATHETERIZATION WITH CORONARY ANGIOGRAM;  Surgeon: Kathleene Hazel, MD;  Location: [facility] CATH LAB;  Service: Cardiovascular;  Laterality:  N/A;  . lesion removed from foot     right foot  . RIGHT/LEFT HEART CATH AND CORONARY ANGIOGRAPHY N/A 01/06/2019   Procedure: RIGHT/LEFT HEART CATH AND CORONARY ANGIOGRAPHY;  Surgeon: Lorretta Harp, MD;  Location: [facility] CV LAB;  Service: Cardiovascular;  Laterality: N/A;  . TONSILLECTOMY AND ADENOIDECTOMY         Family History  Problem Relation  Age of Onset  . Lymphoma Mother   . Heart attack Father 83  . Heart attack Brother 58    Social History   Tobacco Use  . Smoking status: Former Smoker    Packs/day: 1.50    Years: 35.00    Pack years: 52.50    Types: Cigarettes    Quit date: 08/18/2005    Years since quitting: 14.3  . Smokeless tobacco: Never Used  Substance Use Topics  . Alcohol use: No  . Drug use: No    Home Medications Prior to Admission medications   Medication Sig Start Date End Date Taking? Authorizing Provider  acetaminophen (TYLENOL) 500 MG tablet Take 500 mg by mouth every 6 (six) hours as needed (for headaches.).   Yes [provider]  albuterol (VENTOLIN HFA) 108 (90 Base) MCG/ACT inhaler Inhale 1 puff into the lungs every 6 (six) hours as needed for wheezing or shortness of breath.   Yes [provider]  Alirocumab (PRALUENT) 75 MG/ML SOAJ Inject 75 mg into the skin every 14 (fourteen) days. 10/19/19  Yes Larey Dresser, MD  amiodarone (PACERONE) 200 MG tablet Take 1 tablet (200 mg total) by mouth daily. 07/18/19  Yes Minus Breeding, MD  aspirin 81 MG chewable tablet Chew 1 tablet (81 mg total) by mouth daily. 01/06/19  Yes Cheryln Manly, NP  carvedilol (COREG) 6.25 MG tablet Take 1 tablet (6.25 mg total) by mouth 2 (two) times daily with a meal. 07/18/19  Yes Minus Breeding, MD  CINNAMON PO Take 1,000 mg by mouth daily.   Yes [provider]  clopidogrel (PLAVIX) 75 MG tablet TAKE 1 TABLET BY MOUTH EVERY DAY 07/11/19  Yes Minus Breeding, MD  Coenzyme Q10 (CO Q 10) 100 MG CAPS Take 100 mg by mouth daily.   Yes [provider]  furosemide (LASIX) 40 MG tablet Take 1 tablet (40 mg total) by mouth 2 (two) times daily. Take 1 tablet daily 10/17/19  Yes Larey Dresser, MD  icosapent Ethyl (VASCEPA) 1 g capsule Take 1 g by mouth 2 (two) times daily.   Yes [provider]  JARDIANCE 10 MG TABS tablet Take 10 mg by mouth daily. 10/05/17  Yes [provider]  magnesium oxide (MAG-OX) 400 MG tablet Take 400 mg by mouth 2 (two) times daily.   Yes [provider]  metFORMIN (GLUCOPHAGE) 1000 MG tablet Take 1,000 mg by mouth 2 (two) times daily. 10/05/17  Yes [provider]  nitroGLYCERIN (NITROSTAT) 0.4 MG SL tablet Place 1 tablet (0.4 mg total) under the tongue every 5 (five) minutes as needed for chest pain. 04/20/15  Yes Minus Breeding, MD  pantoprazole (PROTONIX) 40 MG tablet Take 40 mg by mouth every evening.    Yes [provider]  pramipexole (MIRAPEX) 1 MG tablet Take 0.5 mg by mouth 4 (four) times daily.    Yes [provider]  predniSONE (DELTASONE) 20 MG tablet Take 20 mg by mouth 2 (two) times daily with a meal.   Yes [provider]  sacubitril-valsartan (ENTRESTO) 97-103 MG Take  1 tablet by mouth 2 (two) times daily. 07/18/19  Yes Rollene Rotunda, MD  spironolactone (ALDACTONE) 25 MG tablet Take 1 tablet (25 mg total) by mouth daily. 10/17/19  Yes Laurey Morale, MD  TOUJEO SOLOSTAR 300 UNIT/ML SOPN Inject 20 Units into the skin at bedtime.  10/14/18  Yes [provider]    Allergies    Brilinta [ticagrelor], Codeine, Lipitor [atorvastatin calcium], and Niaspan [niacin er]  Review of Systems   Review of Systems  Constitutional: Positive for chills and fever.  Respiratory: Positive for cough and shortness of breath.   All other systems reviewed and are negative.   Physical Exam Updated Vital Signs BP 121/62   Pulse 68   Temp 99.5 F (37.5 C) (Oral)   Resp 18   Ht 5\' 10"  (1.778 m)   Wt 98.9 kg   SpO2 (!) 89%   BMI 31.28 kg/m   Physical Exam Vitals and nursing note reviewed.  Constitutional:      Appearance: He is well-developed. He is ill-appearing.  HENT:     Head: Normocephalic and atraumatic.     Mouth/Throat:     Mouth: Mucous membranes are moist.     Pharynx: Oropharynx is clear.  Eyes:     Extraocular Movements: Extraocular movements intact.      Pupils: Pupils are equal, round, and reactive to light.  Cardiovascular:     Rate and Rhythm: Normal rate and regular rhythm.  Pulmonary:     Effort: Pulmonary effort is normal.     Breath sounds: Rhonchi present.  Abdominal:     General: Bowel sounds are normal.     Palpations: Abdomen is soft.  Musculoskeletal:        General: Normal range of motion.     Cervical back: Normal range of motion and neck supple.  Skin:    General: Skin is warm.     Capillary Refill: Capillary refill takes less than 2 seconds.  Neurological:     General: No focal deficit present.     Mental Status: He is alert and oriented to person, place, and time.  Psychiatric:        Mood and Affect: Mood normal.        Behavior: Behavior normal.     ED Results / Procedures / Treatments   Labs (all labs ordered are listed, but only abnormal results are displayed) Labs Reviewed  BASIC METABOLIC PANEL - Abnormal; Notable for the following components:      Result Value   Sodium 132 (*)    CO2 19 (*)    Glucose, Bld 238 (*)    Creatinine, Ser 1.33 (*)    GFR calc non Af Amer 56 (*)    All other components within normal limits  CBC - Abnormal; Notable for the following components:   WBC 13.9 (*)    Platelets 124 (*)    All other components within normal limits  LACTIC ACID, PLASMA - Abnormal; Notable for the following components:   Lactic Acid, Venous 2.0 (*)    All other components within normal limits  RESPIRATORY PANEL BY RT PCR (FLU A&B, COVID)  CULTURE, BLOOD (ROUTINE X 2)  CULTURE, BLOOD (ROUTINE X 2)  LACTIC ACID, PLASMA  TROPONIN I (HIGH SENSITIVITY)  TROPONIN I (HIGH SENSITIVITY)    EKG EKG Interpretation  Date/Time:  Wednesday Dec 21 2019 12:51:32 EDT Ventricular Rate:  73 PR Interval:  190 QRS Duration: 114 QT Interval:  406 QTC Calculation: 447 R  Axis:   -39 Text Interpretation: Normal sinus rhythm Left axis deviation Nonspecific T wave abnormality Abnormal ECG No significant  change since last tracing Confirmed by Jacalyn Lefevre [phone]) on 12/21/2019 7:16:51 PM   Radiology DG Chest 2 View  Result Date: 12/21/2019 CLINICAL DATA:  Shortness of breath.  Fever. EXAM: CHEST - 2 VIEW COMPARISON:  November 08, 2019 chest radiograph; chest CT December 13, 2019 FINDINGS: There is underlying fibrotic change. There is scarring in the upper lobe regions, better seen on CT. In comparison with prior studies, there is ill-defined airspace opacity superimposed on fibrotic type change. No similar changes of this nature elsewhere. Heart size and pulmonary vascularity are normal. Pacemaker lead is attached to the right ventricle. No adenopathy. There is degenerative change in the lower thoracic and upper lumbar regions. IMPRESSION: Airspace opacity right lower lung region, superimposed on fibrosis. Suspect a degree of pneumonia superimposed on fibrosis. Fibrotic changes elsewhere appear stable. Scarring in the upper lobes is better seen on CT than by radiography. Cardiac silhouette within normal limits. Pacemaker lead attached to right ventricle. No adenopathy appreciable by radiography. Electronically Signed   By: Bretta Bang III M.D.   On: 12/21/2019 13:24    Procedures Procedures (including critical care time)  Medications Ordered in ED Medications  sodium chloride flush (NS) 0.9 % injection 3 mL (3 mLs Intravenous Not Given 12/21/19 1819)  acetaminophen (TYLENOL) tablet 1,000 mg (1,000 mg Oral Given 12/21/19 1648)  cefTRIAXone (ROCEPHIN) 1 g in sodium chloride 0.9 % 100 mL IVPB (0 g Intravenous Stopped 12/21/19 1847)  azithromycin (ZITHROMAX) 500 mg in sodium chloride 0.9 % 250 mL IVPB (0 mg Intravenous Stopped 12/21/19 1850)  sodium chloride 0.9 % bolus 1,000 mL (1,000 mLs Intravenous New Bag/Given 12/21/19 1847)    ED Course  I have reviewed the triage vital signs and the nursing notes.  Pertinent labs & imaging results that were available during my care of the patient were reviewed by me  and considered in my medical decision making (see chart for details).    MDM Rules/Calculators/A&P                      Pt placed on 2L oxygen for O2 sats in the upper 80s.  The pt's O2 sats are now in the mid-90s.    Pt does have a RLL pneumonia superimposed on pulmonary fibrosis.  He was given IVFs, rocephin, zithromax.    His Covid was negative.  Pt has improved after treatment, but due to oxygen sats in the 70s and 80s, he will need admission.  Pt d/w Dr. Karren Burly (triad) for admission.   Final Clinical Impression(s) / ED Diagnoses Final diagnoses:  Acute respiratory failure with hypoxia (HCC)  Community acquired pneumonia of right lower lobe of lung    Rx / DC Orders ED Discharge Orders    None       Jacalyn Lefevre, MD 12/21/19 2019

## 2019-12-22 ENCOUNTER — Encounter (HOSPITAL_COMMUNITY): Payer: Self-pay | Admitting: Internal Medicine

## 2019-12-22 ENCOUNTER — Other Ambulatory Visit: Payer: Self-pay

## 2019-12-22 ENCOUNTER — Other Ambulatory Visit (HOSPITAL_COMMUNITY): Payer: BC Managed Care – PPO

## 2019-12-22 DIAGNOSIS — J9601 Acute respiratory failure with hypoxia: Secondary | ICD-10-CM

## 2019-12-22 DIAGNOSIS — J849 Interstitial pulmonary disease, unspecified: Secondary | ICD-10-CM

## 2019-12-22 LAB — PROCALCITONIN: Procalcitonin: 0.29 ng/mL

## 2019-12-22 LAB — MRSA PCR SCREENING: MRSA by PCR: NEGATIVE

## 2019-12-22 LAB — BASIC METABOLIC PANEL
Anion gap: 11 (ref 5–15)
BUN: 19 mg/dL (ref 8–23)
CO2: 20 mmol/L — ABNORMAL LOW (ref 22–32)
Calcium: 8.1 mg/dL — ABNORMAL LOW (ref 8.9–10.3)
Chloride: 101 mmol/L (ref 98–111)
Creatinine, Ser: 1.35 mg/dL — ABNORMAL HIGH (ref 0.61–1.24)
GFR calc Af Amer: >60 mL/min (ref >60)
GFR calc non Af Amer: 55 mL/min — ABNORMAL LOW (ref >60)
Glucose, Bld: 207 mg/dL — ABNORMAL HIGH (ref 70–99)
Potassium: 3.7 mmol/L (ref 3.5–5.1)
Sodium: 132 mmol/L — ABNORMAL LOW (ref 135–145)

## 2019-12-22 LAB — GLUCOSE, CAPILLARY
Glucose-Capillary: 93 mg/dL (ref 70–99)
Glucose-Capillary: 171 mg/dL — ABNORMAL HIGH (ref 70–99)
Glucose-Capillary: 131 mg/dL — ABNORMAL HIGH (ref 70–99)
Glucose-Capillary: 136 mg/dL — ABNORMAL HIGH (ref 70–99)
Glucose-Capillary: 91 mg/dL (ref 70–99)

## 2019-12-22 LAB — SEDIMENTATION RATE: Sed Rate: 44 mm/hr — ABNORMAL HIGH (ref 0–16)

## 2019-12-22 LAB — HEMOGLOBIN A1C
Hgb A1c MFr Bld: 8.9 % — ABNORMAL HIGH (ref 4.8–5.6)
Mean Plasma Glucose: 208.73 mg/dL

## 2019-12-22 MED ORDER — INSULIN ASPART 100 UNIT/ML ~~LOC~~ SOLN
0.0000 [IU] | Freq: Every day | SUBCUTANEOUS | Status: DC
  Administered 2019-12-24: 3 [IU] via SUBCUTANEOUS

## 2019-12-22 MED ORDER — PANTOPRAZOLE SODIUM 40 MG PO TBEC
40.0000 mg | DELAYED_RELEASE_TABLET | Freq: Every evening | ORAL | Status: DC
  Administered 2019-12-22 – 2019-12-25 (×4): 40 mg via ORAL
  Filled 2019-12-22 (×5): qty 1

## 2019-12-22 MED ORDER — SACUBITRIL-VALSARTAN 49-51 MG PO TABS
1.0000 | ORAL_TABLET | Freq: Two times a day (BID) | ORAL | Status: DC
  Administered 2019-12-22 – 2019-12-26 (×8): 1 via ORAL
  Filled 2019-12-22 (×9): qty 1

## 2019-12-22 MED ORDER — SODIUM CHLORIDE 0.9% FLUSH
3.0000 mL | Freq: Two times a day (BID) | INTRAVENOUS | Status: DC
  Administered 2019-12-22 – 2019-12-25 (×8): 3 mL via INTRAVENOUS

## 2019-12-22 MED ORDER — AMIODARONE HCL 200 MG PO TABS
200.0000 mg | ORAL_TABLET | Freq: Every day | ORAL | Status: DC
  Administered 2019-12-22: 200 mg via ORAL
  Filled 2019-12-22: qty 1

## 2019-12-22 MED ORDER — ALBUTEROL SULFATE (2.5 MG/3ML) 0.083% IN NEBU
2.5000 mg | INHALATION_SOLUTION | RESPIRATORY_TRACT | Status: DC | PRN
  Administered 2019-12-22 – 2019-12-24 (×2): 2.5 mg via RESPIRATORY_TRACT
  Filled 2019-12-22: qty 3

## 2019-12-22 MED ORDER — FUROSEMIDE 20 MG PO TABS
40.0000 mg | ORAL_TABLET | Freq: Two times a day (BID) | ORAL | Status: DC
  Administered 2019-12-22: 40 mg via ORAL
  Filled 2019-12-22: qty 2

## 2019-12-22 MED ORDER — ICOSAPENT ETHYL 1 G PO CAPS
1.0000 g | ORAL_CAPSULE | Freq: Two times a day (BID) | ORAL | Status: DC
  Administered 2019-12-22 – 2019-12-25 (×9): 1 g via ORAL
  Filled 2019-12-22 (×12): qty 1

## 2019-12-22 MED ORDER — FUROSEMIDE 20 MG PO TABS
40.0000 mg | ORAL_TABLET | Freq: Two times a day (BID) | ORAL | Status: DC
  Administered 2019-12-22 – 2019-12-26 (×8): 40 mg via ORAL
  Filled 2019-12-22 (×9): qty 2

## 2019-12-22 MED ORDER — CLOPIDOGREL BISULFATE 75 MG PO TABS
75.0000 mg | ORAL_TABLET | Freq: Every day | ORAL | Status: DC
  Administered 2019-12-22 – 2019-12-26 (×5): 75 mg via ORAL
  Filled 2019-12-22 (×5): qty 1

## 2019-12-22 MED ORDER — SODIUM CHLORIDE 0.9 % IV SOLN
2.0000 g | Freq: Three times a day (TID) | INTRAVENOUS | Status: DC
  Administered 2019-12-22 – 2019-12-26 (×10): 2 g via INTRAVENOUS
  Filled 2019-12-22 (×14): qty 2

## 2019-12-22 MED ORDER — SACUBITRIL-VALSARTAN 97-103 MG PO TABS
1.0000 | ORAL_TABLET | Freq: Two times a day (BID) | ORAL | Status: DC
  Filled 2019-12-22: qty 1

## 2019-12-22 MED ORDER — ASPIRIN 81 MG PO CHEW
81.0000 mg | CHEWABLE_TABLET | Freq: Every day | ORAL | Status: DC
  Administered 2019-12-22 – 2019-12-26 (×5): 81 mg via ORAL
  Filled 2019-12-22 (×5): qty 1

## 2019-12-22 MED ORDER — INSULIN ASPART 100 UNIT/ML ~~LOC~~ SOLN
0.0000 [IU] | Freq: Three times a day (TID) | SUBCUTANEOUS | Status: DC
  Administered 2019-12-22 (×2): 2 [IU] via SUBCUTANEOUS
  Administered 2019-12-23 (×2): 3 [IU] via SUBCUTANEOUS
  Administered 2019-12-24: 2 [IU] via SUBCUTANEOUS
  Administered 2019-12-24 – 2019-12-25 (×4): 3 [IU] via SUBCUTANEOUS
  Administered 2019-12-25: 5 [IU] via SUBCUTANEOUS
  Administered 2019-12-26: 2 [IU] via SUBCUTANEOUS
  Administered 2019-12-26: 5 [IU] via SUBCUTANEOUS

## 2019-12-22 MED ORDER — SENNOSIDES-DOCUSATE SODIUM 8.6-50 MG PO TABS: 1.0000 | ORAL_TABLET | Freq: Every evening | ORAL | Status: DC | PRN

## 2019-12-22 MED ORDER — CARVEDILOL 6.25 MG PO TABS
6.4500 mg | ORAL_TABLET | Freq: Two times a day (BID) | ORAL | Status: DC
  Administered 2019-12-22 – 2019-12-26 (×9): 6.25 mg via ORAL
  Filled 2019-12-22 (×9): qty 1

## 2019-12-22 MED ORDER — FUROSEMIDE 20 MG PO TABS: 40.0000 mg | ORAL_TABLET | Freq: Two times a day (BID) | ORAL | Status: DC

## 2019-12-22 MED ORDER — MEXILETINE HCL 150 MG PO CAPS
150.0000 mg | ORAL_CAPSULE | Freq: Two times a day (BID) | ORAL | Status: DC
  Administered 2019-12-22 – 2019-12-26 (×8): 150 mg via ORAL
  Filled 2019-12-22 (×9): qty 1

## 2019-12-22 MED ORDER — PRAMIPEXOLE DIHYDROCHLORIDE 0.25 MG PO TABS
0.5000 mg | ORAL_TABLET | Freq: Three times a day (TID) | ORAL | Status: DC
  Administered 2019-12-22 – 2019-12-26 (×18): 0.5 mg via ORAL
  Filled 2019-12-22 (×19): qty 2

## 2019-12-22 MED ORDER — SPIRONOLACTONE 25 MG PO TABS
25.0000 mg | ORAL_TABLET | Freq: Every day | ORAL | Status: DC
  Administered 2019-12-23 – 2019-12-26 (×4): 25 mg via ORAL
  Filled 2019-12-22 (×4): qty 1

## 2019-12-22 MED ORDER — NITROGLYCERIN 0.4 MG SL SUBL: 0.4000 mg | SUBLINGUAL_TABLET | SUBLINGUAL | Status: DC | PRN

## 2019-12-22 MED ORDER — ALBUTEROL SULFATE (2.5 MG/3ML) 0.083% IN NEBU
2.5000 mg | INHALATION_SOLUTION | Freq: Four times a day (QID) | RESPIRATORY_TRACT | Status: DC
  Administered 2019-12-22 – 2019-12-24 (×7): 2.5 mg via RESPIRATORY_TRACT
  Filled 2019-12-22 (×8): qty 3

## 2019-12-22 MED ORDER — ENOXAPARIN SODIUM 40 MG/0.4ML ~~LOC~~ SOLN
40.0000 mg | Freq: Every day | SUBCUTANEOUS | Status: DC
  Administered 2019-12-22 – 2019-12-26 (×5): 40 mg via SUBCUTANEOUS
  Filled 2019-12-22 (×5): qty 0.4

## 2019-12-22 MED ORDER — ACETAMINOPHEN 500 MG PO TABS
500.0000 mg | ORAL_TABLET | Freq: Four times a day (QID) | ORAL | Status: DC | PRN
  Administered 2019-12-22 – 2019-12-25 (×6): 500 mg via ORAL
  Filled 2019-12-22 (×6): qty 1

## 2019-12-22 MED ORDER — ALBUTEROL SULFATE (2.5 MG/3ML) 0.083% IN NEBU: 2.5000 mg | INHALATION_SOLUTION | Freq: Four times a day (QID) | RESPIRATORY_TRACT | Status: DC | PRN

## 2019-12-22 NOTE — Consult Note (Signed)
NAME:  [name], MRN:  [id number], DOB:  1956/05/19, LOS: 1 ADMISSION DATE:  12/21/2019, CONSULTATION DATE:  5/6 REFERRING MD:  Regalado (TRH), CHIEF COMPLAINT:  Dyspnea    Brief History   64yo male former smoker (quit 14 years ago, >52 pack year hx) with hx CAD, HTN, DM, OSA (noncompliant with CPAP), chronic systolic CHF and VT on amiodarone, ischemic cardiomyopathy recent high res CT chest suggestive of ILD (UIP) presented 5/5 with 4 day hx cough, SOB, chills.  He was admitted by [facility], CXR concerning for RLL PNA. Treated with rocephin/azithro and PCCM consulted for further assistance with ?ILD.   History of present illness   64yo male with hx CAD, HTN, DM, OSA, chronic systolic CHF and VT on amiodarone, ischemic cardiomyopathy recent high res CT chest suggestive of ILD (UIP) presented 5/5 with 4 day hx cough, SOB, chills.  He was admitted by [facility], CXR concerning for RLL PNA. Treated with rocephin/azithro and PCCM consulted for further assistance with ?ILD.   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    Past Medical History   has a past medical history of ASCVD (arteriosclerotic cardiovascular disease), Chronic bronchitis (HCC), HTN (hypertension), Hyperlipidemia, Ischemic cardiomyopathy, OSA (obstructive sleep apnea) (05/24/11), Restless leg syndrome (06/25/2011), and Type 2 diabetes mellitus (HCC).   Significant Hospital Events     Consults:    Procedures:    Significant Diagnostic Tests:  HRCT chest 4/27 (pta)>>> 1. Spectrum of findings compatible with basilar predominant fibrotic interstitial lung disease with mild honeycombing, with interval progression since 01/04/2019 chest CT. Findings are consistent with UIP per consensus guidelines:   Micro Data:  BC x3 5/5>>>  covid 5/5>>> neg  Sputum  5/5>>>  Antimicrobials:  Rocephin 5/5>>> azithro 5/5>>>  Interim history/subjective:  Still c/o SOB but overall feeling better.  Chills resolved.  Cough is non productive.  Denies chest pain.  Wore bipap x2-3 hours overnight r/t WOB but did not tolerate and feels it did not help his dyspnea.   Objective   Blood pressure 135/90, pulse 75, temperature 97.9 F (36.6 C), temperature source Oral, resp. rate 19, height 5\' 10"  (1.778 m), weight 99.2 kg, SpO2 96 %.        Intake/Output Summary (Last 24 hours) at 12/22/2019 1003 Last data filed at 12/22/2019 0310 Gross per 24 hour  Intake 0 ml  Output --  Net 0 ml   Filed Weights   12/21/19 1254 12/22/19 0606  Weight: 98.9 kg 99.2 kg    Examination: General: very pleasant wdwn male NAD sitting on side of bed  HENT: mm moist, no JVD  Lungs: resps even non labored on 3L [place], scattered crackles, no audible wheeze  Cardiovascular: s1s2 rrr  Abdomen: soft, non tender  Extremities: warm and dry, no edema  Neuro: awake, alert, appropriate, MAE    Assessment & Plan:  Acute hypoxic respiratory failure - multifactorial in setting CAP with probable underlying ILD and underlying OSA.   PLAN -  Continue IV abx  Supplemental O2 as needed to keep sats >92%  Sputum culture if able  Trend pct, WBC, fever curve  no IV steroids for now  Consider holding amiodarone  Resume home lasix once renal function allows   Best practice:  Diet: carb mod diet  Pain/Anxiety/Delirium protocol (if indicated): n/a  VAP protocol (if indicated): n/a DVT prophylaxis: lovenox GI prophylaxis: n/a Glucose control: SSI Mobility: OOB Code Status: full Family Communication: per primary  Disposition:   Labs   CBC: Recent Labs  Lab 12/21/19 1304  WBC 13.9*  HGB 15.4  HCT 45.9  MCV 96.0  PLT 124*    Basic Metabolic Panel: Recent Labs  Lab 12/21/19 1304 12/22/19 0045  NA 132* 132*  K 4.2 3.7  CL 98 101  CO2 19* 20*  GLUCOSE 238* 207*  BUN 21 19   CREATININE 1.33* 1.35*  CALCIUM 8.9 8.1*   GFR: Estimated Creatinine Clearance: 65.3 mL/min (A) (by C-G formula based on SCr of 1.35 mg/dL (H)). Recent Labs  Lab 12/21/19 1304 12/21/19 1722 12/21/19 2036 12/22/19 0045  PROCALCITON  --   --   --  0.29  WBC 13.9*  --   --   --   LATICACIDVEN  --  2.0* 1.2  --     Liver Function Tests: No results for input(s): AST, ALT, ALKPHOS, BILITOT, PROT, ALBUMIN in the last 168 hours. No results for input(s): LIPASE, AMYLASE in the last 168 hours. No results for input(s): AMMONIA in the last 168 hours.  ABG    Component Value Date/Time   PHART 7.366 01/06/2019 0840   PCO2ART 35.7 01/06/2019 0840   PO2ART 70.0 (L) 01/06/2019 0840   HCO3 20.4 01/06/2019 0840   TCO2 22 01/06/2019 0840   ACIDBASEDEF 4.0 (H) 01/06/2019 0840   O2SAT 93.0 01/06/2019 0840     Coagulation Profile: No results for input(s): INR, PROTIME in the last 168 hours.  Cardiac Enzymes: No results for input(s): CKTOTAL, CKMB, CKMBINDEX, TROPONINI in the last 168 hours.  HbA1C: Hgb A1c MFr Bld  Date/Time Value Ref Range Status  12/22/2019 12:45 AM 8.9 (H) 4.8 - 5.6 % Final    Comment:    (NOTE) Pre diabetes:          5.7%-6.4% Diabetes:              >6.4% Glycemic control for   <7.0% adults with diabetes   12/24/2017 12:24 PM 7.5 (H) 4.8 - 5.6 % Final    Comment:    (NOTE) Pre diabetes:          5.7%-6.4% Diabetes:              >6.4% Glycemic control for   <7.0% adults with diabetes     CBG: Recent Labs  Lab 12/22/19 0025 12/22/19 0724  GLUCAP 171* 93    Review of Systems:   As per HPI - All other systems reviewed and were neg.     Past Medical History  He,  has a past medical history of ASCVD (arteriosclerotic cardiovascular disease), Chronic bronchitis (HCC), HTN (hypertension), Hyperlipidemia, Ischemic cardiomyopathy, OSA (obstructive sleep apnea) (05/24/11), Restless leg syndrome (06/25/2011), and Type 2 diabetes mellitus (HCC).    Surgical History    Past Surgical History:  Procedure Laterality Date  . CARDIAC CATHETERIZATION  11/24/2012   40-50% ostial LAD, 40% mid LAD, 60-70% ostial D1, 40% proximal LCx, patent OM branch stent, 99% serial proximal-mid RCA lesions, 100% mid RCA occlusion (chronic with left to right collateralization); EF 40%, inferior wall hypokinesis.  Marland Kitchen CARDIAC CATHETERIZATION N/A 05/31/2015   Procedure: Left Heart Cath and Coronary Angiography;  Surgeon: Kathleene Hazel, MD;  Location: [facility] INVASIVE CV LAB;  Service: Cardiovascular;  Laterality: N/A;  . CORONARY STENT INTERVENTION N/A 12/25/2017   Procedure: CORONARY STENT INTERVENTION;  Surgeon:  Swaziland, Peter M, MD;  Location: [facility] INVASIVE CV LAB;  Service: Cardiovascular;  Laterality: N/A;  . CORONARY STENT PLACEMENT  04/2011  . CYSTECTOMY     Spine and jaw  . ICD IMPLANT N/A 07/19/2018   Procedure: ICD IMPLANT;  Surgeon: Regan Lemming, MD;  Location: [facility] INVASIVE CV LAB;  Service: Cardiovascular;  Laterality: N/A;  . LEFT HEART CATH AND CORONARY ANGIOGRAPHY N/A 12/25/2017   Procedure: LEFT HEART CATH AND CORONARY ANGIOGRAPHY;  Surgeon: Swaziland, Peter M, MD;  Location: [facility] INVASIVE CV LAB;  Service: Cardiovascular;  Laterality: N/A;  . LEFT HEART CATHETERIZATION WITH CORONARY ANGIOGRAM N/A 11/24/2012   Procedure: LEFT HEART CATHETERIZATION WITH CORONARY ANGIOGRAM;  Surgeon: Kathleene Hazel, MD;  Location: [facility] CATH LAB;  Service: Cardiovascular;  Laterality: N/A;  . lesion removed from foot     right foot  . RIGHT/LEFT HEART CATH AND CORONARY ANGIOGRAPHY N/A 01/06/2019   Procedure: RIGHT/LEFT HEART CATH AND CORONARY ANGIOGRAPHY;  Surgeon: Runell Gess, MD;  Location: [facility] INVASIVE CV LAB;  Service: Cardiovascular;  Laterality: N/A;  . TONSILLECTOMY AND ADENOIDECTOMY       Social History   reports that he quit smoking about 14 years ago. His smoking use included cigarettes. He has a 52.50 pack-year smoking history. He has never  used smokeless tobacco. He reports that he does not drink alcohol or use drugs.   Family History   His family history includes Heart attack (age of onset: 65) in his brother; Heart attack (age of onset: 87) in his father; Lymphoma in his mother.   Allergies Allergies  Allergen Reactions  . Brilinta [Ticagrelor] Other (See Comments)    Intolerable dyspnea-changed to Plavix  . Codeine Other (See Comments)    Lightheaded, vision issues  . Lipitor [Atorvastatin Calcium] Other (See Comments)    Muscle pain  . Niaspan [Niacin Er] Other (See Comments)    Head hurts     Home Medications  Prior to Admission medications   Medication Sig Start Date End Date Taking? Authorizing Provider  acetaminophen (TYLENOL) 500 MG tablet Take 500 mg by mouth every 6 (six) hours as needed (for headaches.).   Yes [provider]  albuterol (VENTOLIN HFA) 108 (90 Base) MCG/ACT inhaler Inhale 1 puff into the lungs every 6 (six) hours as needed for wheezing or shortness of breath.   Yes [provider]  Alirocumab (PRALUENT) 75 MG/ML SOAJ Inject 75 mg into the skin every 14 (fourteen) days. 10/19/19  Yes Laurey Morale, MD  amiodarone (PACERONE) 200 MG tablet Take 1 tablet (200 mg total) by mouth daily. 07/18/19  Yes Rollene Rotunda, MD  aspirin 81 MG chewable tablet Chew 1 tablet (81 mg total) by mouth daily. 01/06/19  Yes Arty Baumgartner, NP  carvedilol (COREG) 6.25 MG tablet Take 1 tablet (6.25 mg total) by mouth 2 (two) times daily with a meal. 07/18/19  Yes Rollene Rotunda, MD  CINNAMON PO Take 1,000 mg by mouth daily.   Yes [provider]  clopidogrel (PLAVIX) 75 MG tablet TAKE 1 TABLET BY MOUTH EVERY DAY 07/11/19  Yes Rollene Rotunda, MD  Coenzyme Q10 (CO Q 10) 100 MG CAPS Take 100 mg by mouth daily.   Yes [provider]  furosemide (LASIX) 40 MG tablet Take 1 tablet (40 mg total) by mouth 2 (two) times daily. Take 1 tablet daily 10/17/19  Yes Laurey Morale, MD   icosapent Ethyl (VASCEPA) 1 g capsule Take 1 g by mouth 2 (  two) times daily.   Yes [provider]  JARDIANCE 10 MG TABS tablet Take 10 mg by mouth daily. 10/05/17  Yes [provider]  magnesium oxide (MAG-OX) 400 MG tablet Take 400 mg by mouth 2 (two) times daily.   Yes [provider]  metFORMIN (GLUCOPHAGE) 1000 MG tablet Take 1,000 mg by mouth 2 (two) times daily. 10/05/17  Yes [provider]  nitroGLYCERIN (NITROSTAT) 0.4 MG SL tablet Place 1 tablet (0.4 mg total) under the tongue every 5 (five) minutes as needed for chest pain. 04/20/15  Yes Minus Breeding, MD  pantoprazole (PROTONIX) 40 MG tablet Take 40 mg by mouth every evening.    Yes [provider]  pramipexole (MIRAPEX) 1 MG tablet Take 0.5 mg by mouth 4 (four) times daily.    Yes [provider]  predniSONE (DELTASONE) 20 MG tablet Take 20 mg by mouth 2 (two) times daily with a meal.   Yes [provider]  sacubitril-valsartan (ENTRESTO) 97-103 MG Take 1 tablet by mouth 2 (two) times daily. 07/18/19  Yes Minus Breeding, MD  spironolactone (ALDACTONE) 25 MG tablet Take 1 tablet (25 mg total) by mouth daily. 10/17/19  Yes Larey Dresser, MD  TOUJEO SOLOSTAR 300 UNIT/ML SOPN Inject 20 Units into the skin at bedtime.  10/14/18  Yes [provider]     Nickolas Madrid, NP Pulmonary/Critical Care Medicine  12/22/2019  10:03 AM

## 2019-12-22 NOTE — Progress Notes (Signed)
Occupational Therapy Evaluation Only Patient Details Name: [name] MRN: [id number] DOB: [birth year] Today's Date: 12/22/2019    History of Present Illness [name] is a 64 y.o. male with medical history significant of ASCVD, HTN, HLD, T2DM, ICMP/VT s/p ICD, OSA, Pulm Fibrosis and RLS who presents with SOB. Pt admitted with sepsis secondary to community acquired pneumonia and acute hypoxemic respiratory failure.    Clinical Impression   PTA pt resided with wife, independent in all ADL, IADL, and mobility tasks. Pt does not ambulate with an assistive device and reports 0 falls in the last 6 months. Pt still drives. Pt able to ambulate around room and to/from bathroom independently. 0 instances of LOB. Pt completed LB dressing, toileting task, walk-in shower transfer, and grooming tasks at the sink independently. Pt demo good balance and safety throughout. Pt on 3.5L [place] with SpO2 maintaining in 90s with no reports of SOB. Educated pt on energy conservation techniques with handout provided. All questions/concerns answered at this time. Skilled OT services not warranted as pt appears to be functioning at/near baseline for self-care and mobility tasks.     Follow Up Recommendations  No OT follow up    Equipment Recommendations  None recommended by OT    Recommendations for Other Services       Precautions / Restrictions Precautions Precautions: Other (comment) Precaution Comments: On 3.5L [place] - Monitor SpO2 Restrictions Weight Bearing Restrictions: No      Mobility Bed Mobility Overal bed mobility: Modified Independent             General bed mobility comments: HOB elevated, use of bed rail  Transfers Overall transfer level: Independent Equipment used: None             General transfer comment: 0 instances of LOB. Good balance and safety    Balance Overall balance assessment: No apparent balance deficits (not formally assessed)                                         ADL either performed or assessed with clinical judgement   ADL Overall ADL's : At baseline;Independent                                       General ADL Comments: Pt completed LB dressing, toileting, walk-in shower transfer, and grooming at the sink independently. SpO2 maintained in 90s on 3.5L [place]. No reports of SOB. Good balance and safety throughout.      Vision Baseline Vision/History: No visual deficits       Perception     Praxis      Pertinent Vitals/Pain Pain Assessment: No/denies pain     Hand Dominance Right   Extremity/Trunk Assessment Upper Extremity Assessment Upper Extremity Assessment: Overall WFL for tasks assessed   Lower Extremity Assessment Lower Extremity Assessment: Defer to PT evaluation       Communication Communication Communication: No difficulties   Cognition Arousal/Alertness: Awake/alert Behavior During Therapy: WFL for tasks assessed/performed Overall Cognitive Status: Within Functional Limits for tasks assessed                                     General Comments  Pt does not use oxygen at home  and is currently on 3.5L [place]. SpO2 maintained in 90s throughout. No reports of SOB. Educated and provided pt with handout in regards to energy conservation.     Exercises Exercises: Other exercises Other Exercises Other Exercises: Educated pt on pursed lip breathing strategies when SOB presents   Shoulder Instructions      Home Living Family/patient expects to be discharged to:: Private residence Living Arrangements: Spouse/significant other Available Help at Discharge: Family Type of Home: House Home Access: Stairs to enter Technical brewer of Steps: 3   Home Layout: One level     Bathroom Shower/Tub: Occupational psychologist: Standard     Home Equipment: None          Prior Functioning/Environment Level of Independence: Independent        Comments:  Pt independent in all ADL, IADL, and mobility tasks. Pt does not ambulate with an assistive device and reports 0 falls in the last 6 months. Pt still drives and is retired.         OT Problem List: Cardiopulmonary status limiting activity      OT Treatment/Interventions:      OT Goals(Current goals can be found in the care plan section)    OT Frequency:     Barriers to D/C:            Co-evaluation              AM-PAC OT "6 Clicks" Daily Activity     Outcome Measure Help from another person eating meals?: None Help from another person taking care of personal grooming?: None Help from another person toileting, which includes using toliet, bedpan, or urinal?: None Help from another person bathing (including washing, rinsing, drying)?: None Help from another person to put on and taking off regular upper body clothing?: None Help from another person to put on and taking off regular lower body clothing?: None 6 Click Score: 24   End of Session Equipment Utilized During Treatment: Oxygen Nurse Communication: Mobility status  Activity Tolerance: Patient tolerated treatment well Patient left: in bed;with call bell/phone within reach  OT Visit Diagnosis: Other (comment)(decreased activity tolerance)                Time: 0830-0900 OT Time Calculation (min): 30 min Charges:  OT General Charges $OT Visit: 1 Visit OT Evaluation $OT Eval Low Complexity: 1 Low OT Treatments $Self Care/Home Management : 8-22 mins  Mauri Brooklyn OTR/L [phone]  Mauri Brooklyn 12/22/2019, 9:13 AM

## 2019-12-22 NOTE — Progress Notes (Signed)
Placed patient on bipap due to OSA.

## 2019-12-22 NOTE — Evaluation (Signed)
Physical Therapy Evaluation Patient Details Name: [name] MRN: [id number] DOB: [birth year] Today's Date: 12/22/2019   History of Present Illness  [name] is a 64 y.o. male with medical history significant of ASCVD, HTN, HLD, T2DM, ICMP/VT s/p ICD, OSA, Pulm Fibrosis and RLS who presents with SOB. Pt admitted with sepsis secondary to community acquired pneumonia and acute hypoxemic respiratory failure.   Clinical Impression   Pt presents with significant dyspnea on exertion, mild unsteadiness in standing, and decreased activity tolerance vs baseline. Pt to benefit from acute PT to address deficits. Pt ambulated hallway distance on 3LO2, with increased work of breathing and O2 desats to 85% requiring up to 4LO2, standing rest x2 minutes, and breathing technique education to recover. Pt is very independent at baseline, and wants to get back to independence with improved respiratory status. From a mobility standpoint, pt is close to baseline, so no follow up currently warrented post-acutely. PT to progress mobility as tolerated, and will continue to follow acutely.      Follow Up Recommendations No PT follow up;Supervision for mobility/OOB    Equipment Recommendations  None recommended by PT    Recommendations for Other Services       Precautions / Restrictions Precautions Precautions: Other (comment) Precaution Comments: On 3.5L [place] - Monitor SpO2 Restrictions Weight Bearing Restrictions: No      Mobility  Bed Mobility Overal bed mobility: Needs Assistance Bed Mobility: Supine to Sit;Sit to Supine     Supine to sit: Supervision Sit to supine: Supervision   General bed mobility comments: for safety, increased time and effort with use of bedrail.  Transfers Overall transfer level: Needs assistance Equipment used: None Transfers: Sit to/from Stand Sit to Stand: Supervision         General transfer comment: supervision for safety, increased time to steady with  reaching for environment.  Ambulation/Gait Ambulation/Gait assistance: Min guard Gait Distance (Feet): 250 Feet(250+100) Assistive device: None Gait Pattern/deviations: Step-through pattern;Decreased stride length;Trunk flexed Gait velocity: slightly decr   General Gait Details: min guard for safety, occasionally reaching for environment to steady. DOE 3/4 after 250 ft ambulation, SpO2 85% on 3LO2 requiring 4LO2 and standing rest break x2 minutes with cuing for breathing technique.  Stairs            Wheelchair Mobility    Modified Rankin (Stroke Patients Only)       Balance Overall balance assessment: Mild deficits observed, not formally tested                                           Pertinent Vitals/Pain Pain Assessment: No/denies pain    Home Living Family/patient expects to be discharged to:: Private residence Living Arrangements: Spouse/significant other Available Help at Discharge: Family Type of Home: House Home Access: Stairs to enter   Secretary/administrator of Steps: 3 Home Layout: One level Home Equipment: None      Prior Function Level of Independence: Independent         Comments: Pt independent in all ADL, IADL, and mobility tasks. Pt does not ambulate with an assistive device and reports 0 falls in the last 6 months. Pt still drives and is retired. Pt enjoys fishing with his grandson.     Hand Dominance   Dominant Hand: Right    Extremity/Trunk Assessment   Upper Extremity Assessment Upper Extremity Assessment: Defer to OT  evaluation    Lower Extremity Assessment Lower Extremity Assessment: Overall WFL for tasks assessed    Cervical / Trunk Assessment Cervical / Trunk Assessment: Normal  Communication   Communication: No difficulties  Cognition Arousal/Alertness: Awake/alert Behavior During Therapy: WFL for tasks assessed/performed Overall Cognitive Status: Within Functional Limits for tasks assessed                                         General Comments General comments (skin integrity, edema, etc.): SpO2 85% on 3LO2 during ambulation, requiring 4LO2 and standing rest break    Exercises     Assessment/Plan    PT Assessment Patient needs continued PT services  PT Problem List Decreased strength;Decreased mobility;Decreased activity tolerance;Decreased balance;Pain;Decreased safety awareness       PT Treatment Interventions DME instruction;Therapeutic activities;Gait training;Therapeutic exercise;Patient/family education;Balance training;Stair training;Functional mobility training;Neuromuscular re-education    PT Goals (Current goals can be found in the Care Plan section)  Acute Rehab PT Goals Patient Stated Goal: go home, breathe better PT Goal Formulation: With patient Time For Goal Achievement: 01/05/20 Potential to Achieve Goals: Good    Frequency Min 3X/week   Barriers to discharge        Co-evaluation               AM-PAC PT "6 Clicks" Mobility  Outcome Measure Help needed turning from your back to your side while in a flat bed without using bedrails?: None Help needed moving from lying on your back to sitting on the side of a flat bed without using bedrails?: None Help needed moving to and from a bed to a chair (including a wheelchair)?: A Little Help needed standing up from a chair using your arms (e.g., wheelchair or bedside chair)?: A Little Help needed to walk in hospital room?: A Little Help needed climbing 3-5 steps with a railing? : A Little 6 Click Score: 20    End of Session Equipment Utilized During Treatment: Oxygen;Gait belt Activity Tolerance: Patient limited by fatigue Patient left: in bed;with call bell/phone within reach;with family/visitor present Nurse Communication: Mobility status PT Visit Diagnosis: Other abnormalities of gait and mobility (R26.89);Muscle weakness (generalized) (M62.81);History of falling (Z91.81)     Time: 7414-2395 PT Time Calculation (min) (ACUTE ONLY): 27 min   Charges:   PT Evaluation $PT Eval Low Complexity: 1 Low PT Treatments $Gait Training: 8-22 mins       Evangelene Vora E, PT Acute Rehabilitation Services Pager [phone]  Office [phone]  Robt Okuda D Elonda Husky 12/22/2019, 4:17 PM

## 2019-12-22 NOTE — Progress Notes (Signed)
Pharmacy Antibiotic Note  [name] is a 64 y.o. male admitted on 12/21/2019 with pneumonia.  Pharmacy has been consulted for Cefepime dosing.  CrCl of 65 ml/min  Plan: Start Cefepime 2 gms IV q8hr Monitor renal function, clinical status and C&S  Height: 5\' 10"  (177.8 cm) Weight: 99.2 kg (218 lb 11.1 oz) IBW/kg (Calculated) : 73  Temp (24hrs), Avg:99.7 F (37.6 C), Min:97.7 F (36.5 C), Max:103.1 F (39.5 C)  Recent Labs  Lab 12/21/19 1304 12/21/19 1722 12/21/19 2036 12/22/19 0045  WBC 13.9*  --   --   --   CREATININE 1.33*  --   --  1.35*  LATICACIDVEN  --  2.0* 1.2  --     Estimated Creatinine Clearance: 65.3 mL/min (A) (by C-G formula based on SCr of 1.35 mg/dL (H)).    Allergies  Allergen Reactions  . Brilinta [Ticagrelor] Other (See Comments)    Intolerable dyspnea-changed to Plavix  . Codeine Other (See Comments)    Lightheaded, vision issues  . Lipitor [Atorvastatin Calcium] Other (See Comments)    Muscle pain  . Niaspan [Niacin Er] Other (See Comments)    Head hurts    Antimicrobials this admission: Azithro 5/6 >>  Cefepime 5/6 >>  Thank you for allowing pharmacy to be a part of this patient's care.  02/21/20 12/22/2019 5:46 PM

## 2019-12-22 NOTE — Plan of Care (Signed)

## 2019-12-22 NOTE — Progress Notes (Signed)
V60 in use.  Saturation 91-93%

## 2019-12-22 NOTE — Consult Note (Addendum)
Advanced Heart Failure Team Consult Note   Primary Physician: Charlynn Court, NP PCP-Cardiologist:  Minus Breeding, MD  [facility]: Dr. Aundra Dubin   Reason for Consultation: Dyspnea and Medication Recommendations. ? Continuation amiodarone in the setting of restrictive lung disease/ ILD.   HPI:    [name] is seen today for evaluation of dyspnea and Medication Recommendations. ? Continuation amiodarone in the setting of restrictive lung disease/ ILD, at the request of Dr. Tyrell Antonio, Internal Medicine.   64 y.o. with history of CAD, ischemic cardiomyopathy/chronic systolic CHF, OSA, and VT was referred by Dr. Percival Spanish for evaluation of CHF.  Patient has a long history of CAD (see PMH below).  Last cath in 5/20 showed a chronically occluded RCA with collaterals and stable stents in the LCx system, no intervention.  He has had an ischemic cardiomyopathy with EF 20-25% in 5/20, and actually up to 35-40% on 2/21 echo.  He has a Medtronic ICD and is on amiodarone for VT. He has significant OSA but has not been able to tolerate CPAP with a facemask.    CPX was done in 3/21, study was submaximal with moderate HF limitation but also functional limitation from pulmonary restriction and exertional hypoxemia.  V/Q scan was done and was not suggestive of chronic PE. Zio patch in 3/21 showed 2% PVCs.   He was recently seen in [facility] on 4/6 and stable from cardiac standpoint, however given pulmonary restriction noted on PFTs w/ CPX, there was concern for ILD and given amiodarone use, he was ordered to get a high resolution CT of chest to look for evidence of amiodarone toxicity or other form for ILD. Scan was done 4/27 and showed spectrum of findings compatible with basilar predominant fibrotic interstitial lung disease with mild honeycombing, with interval progression since 01/04/2019 chest CT. Findings are consistent with UIP per consensus guidelines. He was referred to pulmonology but has not had visit yet.   On 5/5, pt presented to the [facility] ED w/ complaints of cough and SOB, fever at home, body aches and chills. He has completed vaccination for COVID. In ED, he was tachypenic and hypoxic. RR 24. O2 stats 89% on RA. Had low grade fever, temp 99.2 COVID and influenza test negative. BP stable. Labs showed leukocytosis, WBC 13.9. Lactic acid 2.0. SCr 1.35. PCT 0.29.  CXR showed RLL opacity concerning for PNA. EKG showed NSR. He was admitted for sepsis PNA and started on rocephin/azithromycin.   Today if feels better but continues w/ rigors. Breathing improved but still on 3L [place]. Body aches less severe. AF. CBC not repeated today.   Pulmonology consult placed for further evaluation for possible ILD. AHF team all asked to weigh in. He remains on PO amiodarone 200 mg daily. In NSR on tele.     Echo 09/2019  1. Compared with the echo 3/58/25, systolic function has improved.. Left  ventricular ejection fraction, by estimation, is 35 to 40%. The left  ventricle has moderately decreased function. The left ventricle  demonstrates global hypokinesis. The left  ventricular internal cavity size was mildly dilated. Left ventricular  diastolic parameters are consistent with Grade I diastolic dysfunction  (impaired relaxation).  2. Right ventricular systolic function is low normal. The right  ventricular size is normal. There is normal pulmonary artery systolic  pressure.  3. The mitral valve is normal in structure and function. Trivial mitral  valve regurgitation. No evidence of mitral stenosis.  4. The aortic valve is normal in structure and  function. Aortic valve  regurgitation is not visualized. No aortic stenosis is present.  5. The inferior vena cava is normal in size with greater than 50%  respiratory variability, suggesting right atrial pressure of 3 mmHg.    Review of Systems: [y] = yes, _0  = no   . General: Weight gain _1 ; Weight loss _2 ; Anorexia _3 ; Fatigue _4 ; Fever _5 ; Chills _6 ;  Weakness _7   . Cardiac: Chest pain/pressure _8 ; Resting SOB _9 ; Exertional SOB _10 ; Orthopnea _11 ; Pedal Edema _12 ; Palpitations _13 ; Syncope _14 ; Presyncope _15 ; Paroxysmal nocturnal dyspnea_16   . Pulmonary: Cough _17 ; Wheezing_18 ; Hemoptysis_19 ; Sputum _20 ; Snoring _21   . GI: Vomiting_22 ; Dysphagia_23 ; Melena_24 ; Hematochezia _25 ; Heartburn_26 ; Abdominal pain _27 ; Constipation _28 ; Diarrhea _29 ; BRBPR _30   . GU: Hematuria_31 ; Dysuria _32 ; Nocturia_33   . Vascular: Pain in legs with walking _34 ; Pain in feet with lying flat _35 ; Non-healing sores _36 ; Stroke _37 ; TIA _38 ; Slurred speech _39 ;  . Neuro: Headaches_40 ; Vertigo_41 ; Seizures_42 ; Paresthesias_43 ;Blurred vision _44 ; Diplopia _45 ; Vision changes _46   . Ortho/Skin: Arthritis _47 ; Joint pain _48 ; Muscle pain _49 ; Joint swelling _50 ; Back Pain _51 ; Rash _52   . Psych: Depression_53 ; Anxiety_54   . Heme: Bleeding problems _55 ; Clotting disorders _56 ; Anemia _57   . Endocrine: Diabetes _58 ; Thyroid dysfunction_59   Home Medications Prior to Admission medications   Medication Sig Start Date End Date Taking? Authorizing Provider  acetaminophen (TYLENOL) 500 MG tablet Take 500 mg by mouth every 6 (six) hours as needed (for headaches.).   Yes [provider]  albuterol (VENTOLIN HFA) 108 (90 Base) MCG/ACT inhaler Inhale 1 puff into the lungs every 6 (six) hours as needed for wheezing or shortness of breath.   Yes [provider]  Alirocumab (PRALUENT) 75 MG/ML SOAJ Inject 75 mg into the skin every 14 (fourteen) days. 10/19/19  Yes Larey Dresser, MD  amiodarone (PACERONE) 200 MG tablet Take 1 tablet (200 mg total) by mouth daily. 07/18/19  Yes Minus Breeding, MD  aspirin 81 MG chewable tablet Chew 1 tablet (81 mg total) by mouth daily. 01/06/19  Yes Cheryln Manly, NP  carvedilol (COREG) 6.25 MG tablet Take 1 tablet (6.25 mg total) by mouth 2 (two) times daily with a meal. 07/18/19  Yes Minus Breeding, MD  CINNAMON PO Take  1,000 mg by mouth daily.   Yes [provider]  clopidogrel (PLAVIX) 75 MG tablet TAKE 1 TABLET BY MOUTH EVERY DAY 07/11/19  Yes Minus Breeding, MD  Coenzyme Q10 (CO Q 10) 100 MG CAPS Take 100 mg by mouth daily.   Yes [provider]  furosemide (LASIX) 40 MG tablet Take 1 tablet (40 mg total) by mouth 2 (two) times daily. Take 1 tablet daily 10/17/19  Yes Larey Dresser, MD  icosapent Ethyl (VASCEPA) 1 g capsule Take 1 g by mouth 2 (two) times daily.   Yes [provider]  JARDIANCE 10 MG TABS tablet Take 10 mg by mouth daily. 10/05/17  Yes [provider]  magnesium oxide (MAG-OX) 400 MG tablet Take 400 mg by mouth 2 (two) times daily.   Yes [provider]  metFORMIN (GLUCOPHAGE) 1000 MG tablet Take 1,000 mg by  mouth 2 (two) times daily. 10/05/17  Yes [provider]  nitroGLYCERIN (NITROSTAT) 0.4 MG SL tablet Place 1 tablet (0.4 mg total) under the tongue every 5 (five) minutes as needed for chest pain. 04/20/15  Yes Minus Breeding, MD  pantoprazole (PROTONIX) 40 MG tablet Take 40 mg by mouth every evening.    Yes [provider]  pramipexole (MIRAPEX) 1 MG tablet Take 0.5 mg by mouth 4 (four) times daily.    Yes [provider]  predniSONE (DELTASONE) 20 MG tablet Take 20 mg by mouth 2 (two) times daily with a meal.   Yes [provider]  sacubitril-valsartan (ENTRESTO) 97-103 MG Take 1 tablet by mouth 2 (two) times daily. 07/18/19  Yes Minus Breeding, MD  spironolactone (ALDACTONE) 25 MG tablet Take 1 tablet (25 mg total) by mouth daily. 10/17/19  Yes Larey Dresser, MD  TOUJEO SOLOSTAR 300 UNIT/ML SOPN Inject 20 Units into the skin at bedtime.  10/14/18  Yes [provider]    Past Medical History: Past Medical History:  Diagnosis Date  . ASCVD (arteriosclerotic cardiovascular disease)    a. MI 2004, did not seek care at that time. b. s/p PTCA/BMS to OM1 04/2011 (known totally occluded RCA with L-R  collaterals and failed angioplasty on that vessel). c.)  Cath 2019 chronically occluded right coronary artery with collateralization.  90% circumflex stenosis treated with a DES.  Marland Kitchen Chronic bronchitis ([place])   . HTN (hypertension)   . Hyperlipidemia   . Ischemic cardiomyopathy    EF 30%.    . OSA (obstructive sleep apnea) 05/24/11  . Restless leg syndrome 06/25/2011  . Type 2 diabetes mellitus ([place])     Past Surgical History: Past Surgical History:  Procedure Laterality Date  . CARDIAC CATHETERIZATION  11/24/2012   40-50% ostial LAD, 40% mid LAD, 60-70% ostial D1, 40% proximal LCx, patent OM branch stent, 99% serial proximal-mid RCA lesions, 100% mid RCA occlusion (chronic with left to right collateralization); EF 40%, inferior wall hypokinesis.  Marland Kitchen CARDIAC CATHETERIZATION N/A 05/31/2015   Procedure: Left Heart Cath and Coronary Angiography;  Surgeon: Burnell Blanks, MD;  Location: [place] [facility];  Service: Cardiovascular;  Laterality: N/A;  . CORONARY STENT INTERVENTION N/A 12/25/2017   Procedure: CORONARY STENT INTERVENTION;  Surgeon: Martinique, Peter M, MD;  Location: [place] [facility];  Service: Cardiovascular;  Laterality: N/A;  . CORONARY STENT PLACEMENT  04/2011  . CYSTECTOMY     Spine and jaw  . ICD IMPLANT N/A 07/19/2018   Procedure: ICD IMPLANT;  Surgeon: Constance Haw, MD;  Location: [place] [facility];  Service: Cardiovascular;  Laterality: N/A;  . LEFT HEART CATH AND CORONARY ANGIOGRAPHY N/A 12/25/2017   Procedure: LEFT HEART CATH AND CORONARY ANGIOGRAPHY;  Surgeon: Martinique, Peter M, MD;  Location: [place] [facility];  Service: Cardiovascular;  Laterality: N/A;  . LEFT HEART CATHETERIZATION WITH CORONARY ANGIOGRAM N/A 11/24/2012   Procedure: LEFT HEART CATHETERIZATION WITH CORONARY ANGIOGRAM;  Surgeon: Burnell Blanks, MD;  Location: [facility] CATH LAB;  Service: Cardiovascular;  Laterality: N/A;  . lesion removed from foot     right foot  . RIGHT/LEFT HEART CATH  AND CORONARY ANGIOGRAPHY N/A 01/06/2019   Procedure: RIGHT/LEFT HEART CATH AND CORONARY ANGIOGRAPHY;  Surgeon: Lorretta Harp, MD;  Location: [place] [facility];  Service: Cardiovascular;  Laterality: N/A;  . TONSILLECTOMY AND ADENOIDECTOMY      Family History: Family History  Problem Relation Age of Onset  .  Lymphoma Mother   . Heart attack Father 53  . Heart attack Brother 17    Social History: Social History   Socioeconomic History  . Marital status: Married    Spouse name: Not on file  . Number of children: 1  . Years of education: Not on file  . Highest education level: Not on file  Occupational History  . Occupation: Development worker, international aid   Tobacco Use  . Smoking status: Former Smoker    Packs/day: 1.50    Years: 35.00    Pack years: 52.50    Types: Cigarettes    Quit date: 08/18/2005    Years since quitting: 14.3  . Smokeless tobacco: Never Used  Substance and Sexual Activity  . Alcohol use: No  . Drug use: No  . Sexual activity: Not Currently  Other Topics Concern  . Not on file  Social History Narrative   Married with 1 grown daughter who is a Network engineer for a school         Social Determinants of Radio broadcast assistant Strain:   . Difficulty of Paying Living Expenses:   Food Insecurity:   . Worried About Charity fundraiser in the Last Year:   . Arboriculturist in the Last Year:   Transportation Needs:   . Film/video editor (Medical):   Marland Kitchen Lack of Transportation (Non-Medical):   Physical Activity:   . Days of Exercise per Week:   . Minutes of Exercise per Session:   Stress:   . Feeling of Stress :   Social Connections:   . Frequency of Communication with Friends and Family:   . Frequency of Social Gatherings with Friends and Family:   . Attends Religious Services:   . Active Member of Clubs or Organizations:   . Attends Archivist Meetings:   Marland Kitchen Marital Status:     Allergies:  Allergies  Allergen Reactions  . Brilinta  [Ticagrelor] Other (See Comments)    Intolerable dyspnea-changed to Plavix  . Codeine Other (See Comments)    Lightheaded, vision issues  . Lipitor [Atorvastatin Calcium] Other (See Comments)    Muscle pain  . Niaspan [Niacin Er] Other (See Comments)    Head hurts    Objective:    Vital Signs:   Temp:  [97.7 F (36.5 C)-99.5 F (37.5 C)] 99.2 F (37.3 C) (05/06 1134) Pulse Rate:  [57-79] 71 (05/06 1134) Resp:  [16-24] 24 (05/06 1134) BP: (101-135)/(56-90) 111/63 (05/06 1134) SpO2:  [89 %-96 %] 92 % (05/06 1134) Weight:  [98.9 kg-99.2 kg] 99.2 kg (05/06 0606) Last BM Date: 12/21/19  Weight change: Filed Weights   12/21/19 1254 12/22/19 0606  Weight: 98.9 kg 99.2 kg    Intake/Output:   Intake/Output Summary (Last 24 hours) at 12/22/2019 1233 Last data filed at 12/22/2019 0310 Gross per 24 hour  Intake 0 ml  Output -  Net 0 ml      Physical Exam    General:  Moderately obese, fatigue appearing WM, shaking rigors. No resp difficulty HEENT: normal Neck: supple. JVP . Carotids 2+ bilat; no bruits. No lymphadenopathy or thyromegaly appreciated. Cor: PMI nondisplaced. Regular rate & rhythm. No rubs, gallops or murmurs. Lungs: clear Abdomen: obese, soft, nontender, nondistended. No hepatosplenomegaly. No bruits or masses. Good bowel sounds. Extremities: no cyanosis, clubbing, rash, edema Neuro: alert & orientedx3, cranial nerves grossly intact. moves all 4 extremities w/o difficulty. Affect pleasant   Telemetry   NSR 80s   EKG  NSR 73 bpm    Labs   Basic Metabolic Panel: Recent Labs  Lab 12/21/19 1304 12/22/19 0045  NA 132* 132*  K 4.2 3.7  CL 98 101  CO2 19* 20*  GLUCOSE 238* 207*  BUN 21 19  CREATININE 1.33* 1.35*  CALCIUM 8.9 8.1*    Liver Function Tests: No results for input(s): AST, ALT, ALKPHOS, BILITOT, PROT, ALBUMIN in the last 168 hours. No results for input(s): LIPASE, AMYLASE in the last 168 hours. No results for input(s): AMMONIA in  the last 168 hours.  CBC: Recent Labs  Lab 12/21/19 1304  WBC 13.9*  HGB 15.4  HCT 45.9  MCV 96.0  PLT 124*    Cardiac Enzymes: No results for input(s): CKTOTAL, CKMB, CKMBINDEX, TROPONINI in the last 168 hours.  BNP: BNP (last 3 results) Recent Labs    01/04/19 1433 10/17/19 1452  BNP 187.1* 46.9    ProBNP (last 3 results) Recent Labs    03/31/19 1557  PROBNP 55     CBG: Recent Labs  Lab 12/22/19 0025 12/22/19 0724 12/22/19 1132  GLUCAP 171* 93 131*    Coagulation Studies: No results for input(s): LABPROT, INR in the last 72 hours.   Imaging   DG Chest 2 View  Result Date: 12/21/2019 CLINICAL DATA:  Shortness of breath.  Fever. EXAM: CHEST - 2 VIEW COMPARISON:  November 08, 2019 chest radiograph; chest CT December 13, 2019 FINDINGS: There is underlying fibrotic change. There is scarring in the upper lobe regions, better seen on CT. In comparison with prior studies, there is ill-defined airspace opacity superimposed on fibrotic type change. No similar changes of this nature elsewhere. Heart size and pulmonary vascularity are normal. Pacemaker lead is attached to the right ventricle. No adenopathy. There is degenerative change in the lower thoracic and upper lumbar regions. IMPRESSION: Airspace opacity right lower lung region, superimposed on fibrosis. Suspect a degree of pneumonia superimposed on fibrosis. Fibrotic changes elsewhere appear stable. Scarring in the upper lobes is better seen on CT than by radiography. Cardiac silhouette within normal limits. Pacemaker lead attached to right ventricle. No adenopathy appreciable by radiography. Electronically Signed   By: Lowella Grip III M.D.   On: 12/21/2019 13:24      Medications:     Current Medications: . amiodarone  200 mg Oral Daily  . aspirin  81 mg Oral Daily  . carvedilol  6.25 mg Oral BID WC  . clopidogrel  75 mg Oral Daily  . enoxaparin (LOVENOX) injection  40 mg Subcutaneous Daily  . furosemide   40 mg Oral BID  . icosapent Ethyl  1 g Oral BID  . insulin aspart  0-15 Units Subcutaneous TID WC  . insulin aspart  0-5 Units Subcutaneous QHS  . pantoprazole  40 mg Oral QPM  . pramipexole  0.5 mg Oral TID AC & HS  . sodium chloride flush  3 mL Intravenous Once  . sodium chloride flush  3 mL Intravenous Q12H     Infusions: . azithromycin    . cefTRIAXone (ROCEPHIN)  IV        Assessment/Plan   1. CAP - CXR shows RLL PNA. WBC 13 on admit. PCT 0.29. Now AF - continue abx per primary team, on Rocephin and Azithromycin  - blood cultures NGTD - consider sputum cultures   2. Acute Hypoxic Respiratory Failure - hypoxic on RA at time of admit, O2 sats in the 80s - 2/2 combination CAP + likely underlying ILD -  doubt CHF playing a role. CXR w/o overt edema. BNP not obtained. However we can check Optivol diagnostics to better assess volume status  - remains on 3L [place]  - continue to treat PNA. Pulmonology consult pending   3. Sepsis - 2/2 PNA - resolved - Blood cultures pending.   4. ? ILD - pulmonary restriction noted on PFTs w/ recent CPX - High resolution chest CT 4/27 w/ findings c/w basilar predominant fibrotic interstitial lung disease with mild honeycombing, with interval progression since 01/04/2019 chest CT. - he has been on PO amiodarone since 12/2018 for VT. Initially on loading dose 400 mg BID w/ taper down to maintenance dose of 200 mg once daily. He did not have baseline PFTs at the start of therapy  - Will check ESR. If markedly elevated, would have high concern for potential amiodarone pulmonary toxicity - pulmonology to also weigh in   5. Chronic Systolic Heart Failure - Ischemic cardiomyopathy.  Echo in 5/20 with EF 20-25%, but echo in 2/21 looked better, with EF 35-40%.   - CPX in 3/21 with moderate HF limitation.  NYHA II-III - Has Medtronic ICD. Narrow QRS so not CRT candidate  - Appears euvolemic on exam but can check Optivol diagnostics to better assess  volume status. He did receive 1L of fluids as part of sepsis protocol. His wt is up ~3 lb above his recent clinic wt  - Continue PO Lasix 40 mg bid - Home Entresto and Spiro on hold for soft BP and AKI  - SGLT2i also held on admit ([place])  6. CAD:  - LHC 12/2018 showed chronically occluded RCA w/ patent LCx system stents. No interventional targets - denies ischemic CP - continue ASA + Plavix +  blocker - on PCSK9i (Praluent) at home - on Vascepa for hypertriglyceridemia   7. H/o VT - has ICD - currently on maintenance  therapy w/ PO amiodarone but given concerns for possible pulmonary toxicity, may need to discontinue - if amiodarone needs to be discontinued, may need EP to weigh in regarding other therapies. ? Mexiletine   8. OSA - noncompliant w/ CPAP due to inability to tolerate face mask  Length of Stay: 1  Lyda Jester, PA-C  12/22/2019, 12:33 PM  Advanced Heart Failure Team Pager [phone] (M-F; 7a - 4p)  Please contact [facility] for night-coverage after hours (4p -7a ) and weekends on [email or web address]  Patient seen with PA, agree with the above note.   Patient was admitted with RLL PNA.  CHF seems fairly stable.  Question has been raised about amiodarone lung toxicity.  He had high resolution CT chest prior to admission with fibrotic interstitial lung disease. Creatinine stable at 1.35.   General: NAD Neck: Thick, no JVD, no thyromegaly or thyroid nodule.  Lungs: Rhonchi/crackles R>L.  CV: Nondisplaced PMI.  Heart regular S1/S2, no S3/S4, no murmur.  No peripheral edema.  No carotid bruit.  Normal pedal pulses.  Abdomen: Soft, nontender, no hepatosplenomegaly, no distention.  Skin: Intact without lesions or rashes.  Neurologic: Alert and oriented x 3.  Psych: Normal affect. Extremities: No clubbing or cyanosis.  HEENT: Normal.   Suspect PNA superimposed on baseline ILD, ?UIP.  Discussed with pulmonary, does not look like amiodarone lung toxicity.  However,  given significant underlying lung disease, we are going to stop amiodarone.  - Will stop amiodarone and start mexiletine 150 mg bid to control his ventricular arrhythmias.  - Continue abx for PNA.  - Would restart home Lasix  40 mg bid though he seems fairly well optimized from a CHF standpoint.  - He can restart spironolactone, Entresto (use 49/51 for now), and dapagliflozin.   Loralie Champagne 12/22/2019 3:38 PM

## 2019-12-22 NOTE — Progress Notes (Signed)
8P38 [place] Temp 103.1 SPO2 86 2L [place] at 1614. I increased oxygen to 6L  SPO2 90. Gave pt 500 mg tylenol and informed Hartley Barefoot MD via Amion. Provider recommended another dose of 500 mg tylenol. Called Rapid Response nurse to bedside. At 1712  gave 500 mg tylenol, Temp 101.7. Dr Sunnie Nielsen MD arrived at bedside and assessed pt. Albuterol was ordered and given to pt. At 1815 temp 100.3  BP 95/55 SPO2 92 ON 5L [place]. Pt resting in bed now.

## 2019-12-22 NOTE — Progress Notes (Addendum)
PROGRESS NOTE    [name]  POE:[id number] DOB: 1955/12/19 DOA: 12/21/2019 PCP: Hal Morales, NP   Brief Narrative: 64 year old with past medical history significant for ASCVD, hypertension, hyperlipidemia, diabetes type 2, ICP/V. tach status post ICD, OSA, pulmonary fibrosis and RLS, who presented with shortness of breath.  He also complained of fatigue and weakness.  He reports fever chills cough and shortness of breath.  Reports dry cough no productive sputum.  He completed his vaccination for Covid.  His primary care doctor prescribed him prednisone for which he took for 1 day. Chest x-ray showed airspace opacity right lower lung region, superimposed on fibrosis.  Suspect degree of pneumonia superimposed on fibrosis.  Patient has been admitted for community-acquired pneumonia.  Assessment & Plan:   Active Problems:   Community acquired pneumonia    1-Acute hypoxic respiratory failure: Secondary to pneumonia, underlying pulmonary fibrosis: Patient presented with worsening shortness of breath, cough chills, leukocytosis.  Influenza panel and Covid negative. -Continue with IV ceftriaxone and azithromycin. -Continue with oral Lasix. -Continue with nebulizer, albuterol.  -CCM consulted. Needs to establish care with Pulmonologist. Should we start IV steroids.   2-Sepsis Secondary  to Community-acquired pneumonia: Presented with chills, leukocytosis, low-grade temperature Continue with ceftriaxone and azithromycin.  Spiking fever, will change ceftriaxone to cefepime.  Extra nebulizer.  Now requiring 6 L, report chronic unilateral nostril obstruction. Could use venturi mask if needed.   3-CAD status post PCI, ICM/V. tach status post ICD, chronic systolic and diastolic heart failure: Continue aspirin, clopidogrel, amiodarone, carvedilol Continue with Lasix. Holding spironolactone due to slight increase in creatinine Will inform HF team of patient admission.   4-AKI; monitor  on lasix.   5-HLD: On alirocumab and icosapent ethyl.  6-Hepatic steatosis; needs to loose weight.  Diabetes type 2: Continue with a sliding scale insulin RLS: Continue with pramipexole Obesity BMI 31:   Estimated body mass index is 31.38 kg/m as calculated from the following:   Height as of this encounter: 5\' 10"  (1.778 m).   Weight as of this encounter: 99.2 kg.   DVT prophylaxis: Lovenox Code Status: Full code Family Communication: Care discussed with patient.  Disposition Plan:  Patient is from: Home  Anticipated d/c date: 2-3 days Anticipated discharge to ; Home Barriers to d/c or necessity for inpatient status: admitted with acute hypoxic resp failure, PNA, evaluation for pulmonary fibrosis.   Consultants:   Pulmonary  Cardiology  Procedures:   none  Antimicrobials:  Ceftriaxone and azithromycin.   Subjective: He is breathing better than on admission. Not at baseline yet.  Denies chest pain.  He takes amiodarone, Dr has been checking his lugs.  He report dry cough. He has lost some weight.    Objective: Vitals:   12/22/19 0330 12/22/19 0433 12/22/19 0606 12/22/19 0726  BP: 110/64     Pulse: 62 (!) 58    Resp: 18 17    Temp: 99 F (37.2 C)   97.9 F (36.6 C)  TempSrc: Axillary     SpO2: 95% 94%    Weight:   99.2 kg   Height:        Intake/Output Summary (Last 24 hours) at 12/22/2019 0728 Last data filed at 12/22/2019 0310 Gross per 24 hour  Intake 0 ml  Output -  Net 0 ml   Filed Weights   12/21/19 1254 12/22/19 0606  Weight: 98.9 kg 99.2 kg    Examination:  General exam: Appears calm and comfortable  Respiratory system:  Tachypnea, bilateral dry crackles.  Cardiovascular system: S1 & S2 heard, RRR. No JVD, murmurs, rubs, gallops or clicks. No pedal edema. Gastrointestinal system: Abdomen is nondistended, soft and nontender. No organomegaly or masses felt. Normal bowel sounds heard. Central nervous system: Alert and oriented. No  focal neurological deficits. Extremities: Symmetric 5 x 5 power. Skin: No rashes, lesions or ulcers   Data Reviewed: I have personally reviewed following labs and imaging studies  CBC: Recent Labs  Lab 12/21/19 1304  WBC 13.9*  HGB 15.4  HCT 45.9  MCV 96.0  PLT 124*   Basic Metabolic Panel: Recent Labs  Lab 12/21/19 1304 12/22/19 0045  NA 132* 132*  K 4.2 3.7  CL 98 101  CO2 19* 20*  GLUCOSE 238* 207*  BUN 21 19  CREATININE 1.33* 1.35*  CALCIUM 8.9 8.1*   GFR: Estimated Creatinine Clearance: 65.3 mL/min (A) (by C-G formula based on SCr of 1.35 mg/dL (H)). Liver Function Tests: No results for input(s): AST, ALT, ALKPHOS, BILITOT, PROT, ALBUMIN in the last 168 hours. No results for input(s): LIPASE, AMYLASE in the last 168 hours. No results for input(s): AMMONIA in the last 168 hours. Coagulation Profile: No results for input(s): INR, PROTIME in the last 168 hours. Cardiac Enzymes: No results for input(s): CKTOTAL, CKMB, CKMBINDEX, TROPONINI in the last 168 hours. BNP (last 3 results) Recent Labs    03/31/19 1557  PROBNP 55   HbA1C: Recent Labs    12/22/19 0045  HGBA1C 8.9*   CBG: Recent Labs  Lab 12/22/19 0724  GLUCAP 93   Lipid Profile: No results for input(s): CHOL, HDL, LDLCALC, TRIG, CHOLHDL, LDLDIRECT in the last 72 hours. Thyroid Function Tests: No results for input(s): TSH, T4TOTAL, FREET4, T3FREE, THYROIDAB in the last 72 hours. Anemia Panel: No results for input(s): VITAMINB12, FOLATE, FERRITIN, TIBC, IRON, RETICCTPCT in the last 72 hours. Sepsis Labs: Recent Labs  Lab 12/21/19 1722 12/21/19 2036 12/22/19 0045  PROCALCITON  --   --  0.29  LATICACIDVEN 2.0* 1.2  --     Recent Results (from the past 240 hour(s))  Culture, blood (routine x 2)     Status: None (Preliminary result)   Collection Time: 12/21/19  5:19 PM   Specimen: BLOOD  Result Value Ref Range Status   Specimen Description BLOOD RIGHT ANTECUBITAL  Final   Special  Requests   Final    BOTTLES DRAWN AEROBIC AND ANAEROBIC Blood Culture results may not be optimal due to an excessive volume of blood received in culture bottles   Culture   Final    NO GROWTH < 12 HOURS Performed at [facility], [address] [place], [place] [address]    Report Status PENDING  Incomplete  Respiratory Panel by RT PCR (Flu A&B, Covid) - Nasopharyngeal Swab     Status: None   Collection Time: 12/21/19  6:19 PM   Specimen: Nasopharyngeal Swab  Result Value Ref Range Status   SARS Coronavirus 2 by RT PCR NEGATIVE NEGATIVE Final    Comment: (NOTE) SARS-CoV-2 target nucleic acids are NOT DETECTED. The SARS-CoV-2 RNA is generally detectable in upper respiratoy specimens during the acute phase of infection. The lowest concentration of SARS-CoV-2 viral copies this assay can detect is 131 copies/mL. A negative result does not preclude SARS-Cov-2 infection and should not be used as the sole basis for treatment or other patient management decisions. A negative result may occur with  improper specimen collection/handling, submission of specimen other than nasopharyngeal swab,  presence of viral mutation(s) within the areas targeted by this assay, and inadequate number of viral copies (<131 copies/mL). A negative result must be combined with clinical observations, patient history, and epidemiological information. The expected result is Negative. Fact Sheet for Patients:  PinkCheek.be Fact Sheet for Healthcare Providers:  GravelBags.it This test is not yet ap proved or cleared by the [place] FDA and  has been authorized for detection and/or diagnosis of SARS-CoV-2 by FDA under an Emergency Use Authorization (EUA). This EUA will remain  in effect (meaning this test can be used) for the duration of the COVID-19 declaration under Section 564(b)(1) of the Act, 21 U.S.C. section 360bbb-3(b)(1), unless the  authorization is terminated or revoked sooner.    Influenza A by PCR NEGATIVE NEGATIVE Final   Influenza B by PCR NEGATIVE NEGATIVE Final    Comment: (NOTE) The Xpert Xpress SARS-CoV-2/FLU/RSV assay is intended as an aid in  the diagnosis of influenza from Nasopharyngeal swab specimens and  should not be used as a sole basis for treatment. Nasal washings and  aspirates are unacceptable for Xpert Xpress SARS-CoV-2/FLU/RSV  testing. Fact Sheet for Patients: PinkCheek.be Fact Sheet for Healthcare Providers: GravelBags.it This test is not yet approved or cleared by the [place] FDA and  has been authorized for detection and/or diagnosis of SARS-CoV-2 by  FDA under an Emergency Use Authorization (EUA). This EUA will remain  in effect (meaning this test can be used) for the duration of the  Covid-19 declaration under Section 564(b)(1) of the Act, 21  U.S.C. section 360bbb-3(b)(1), unless the authorization is  terminated or revoked. Performed at [facility], [address], [place], [place] [address]   Culture, blood (routine x 2)     Status: None (Preliminary result)   Collection Time: 12/21/19  8:36 PM   Specimen: BLOOD RIGHT HAND  Result Value Ref Range Status   Specimen Description BLOOD RIGHT HAND  Final   Special Requests   Final    BOTTLES DRAWN AEROBIC AND ANAEROBIC Blood Culture adequate volume PT ON ROCEPHIN ZITHROMAX   Culture   Final    NO GROWTH < 12 HOURS Performed at [facility], [place] [address] [place], [place] [address]    Report Status PENDING  Incomplete         Radiology Studies: DG Chest 2 View  Result Date: 12/21/2019 CLINICAL DATA:  Shortness of breath.  Fever. EXAM: CHEST - 2 VIEW COMPARISON:  November 08, 2019 chest radiograph; chest CT December 13, 2019 FINDINGS: There is underlying fibrotic change. There is scarring in the upper lobe regions, better seen on CT. In comparison with  prior studies, there is ill-defined airspace opacity superimposed on fibrotic type change. No similar changes of this nature elsewhere. Heart size and pulmonary vascularity are normal. Pacemaker lead is attached to the right ventricle. No adenopathy. There is degenerative change in the lower thoracic and upper lumbar regions. IMPRESSION: Airspace opacity right lower lung region, superimposed on fibrosis. Suspect a degree of pneumonia superimposed on fibrosis. Fibrotic changes elsewhere appear stable. Scarring in the upper lobes is better seen on CT than by radiography. Cardiac silhouette within normal limits. Pacemaker lead attached to right ventricle. No adenopathy appreciable by radiography. Electronically Signed   By: Lowella Grip III M.D.   On: 12/21/2019 13:24        Scheduled Meds: . amiodarone  200 mg Oral Daily  . aspirin  81 mg Oral Daily  . carvedilol  6.25  mg Oral BID WC  . clopidogrel  75 mg Oral Daily  . enoxaparin (LOVENOX) injection  40 mg Subcutaneous Daily  . furosemide  40 mg Oral BID  . icosapent Ethyl  1 g Oral BID  . insulin aspart  0-15 Units Subcutaneous TID WC  . insulin aspart  0-5 Units Subcutaneous QHS  . pantoprazole  40 mg Oral QPM  . pramipexole  0.5 mg Oral QID  . pramipexole  0.5 mg Oral TID AC & HS  . sodium chloride flush  3 mL Intravenous Once  . sodium chloride flush  3 mL Intravenous Q12H   Continuous Infusions: . azithromycin    . cefTRIAXone (ROCEPHIN)  IV       LOS: 1 day    Time spent: 35 minutes.     Alba Cory, MD [facility]   If 7PM-7AM, please contact night-coverage [email or web address]  12/22/2019, 7:28 AM

## 2019-12-22 NOTE — Significant Event (Signed)
Rapid Response Event Note   Called to patient's bedside for Temp 103.1 about an hour ago. Per RN patient diaphoretic after receiving Tylenol x2.   Upon my arrival patient alert and very conversant.  States he has been up walking today and is feeling better.  His only complaint is that he is Hot.  BP 96/53  SR 73  RR 20  O2 sat 89% on 3L [place] Temp 101.7  Increased O2 to 6L [place]   O2 sats 90-92% Gave patient ice cold washcloths to help him cool off. Taught patient how to do IS and encouraged him to continue to do IS on an hourly basis while he is awake.    Dr Sunnie Nielsen at bedside to assess patient.  Plan of Care: Transition patient to a venturi mask if unable to maintain O2 sats >90% on 6L [place].  (per patient he chronically has an obstructed nostral and is a mouth breather)   Event End: 1755     Marcellina Millin

## 2019-12-23 ENCOUNTER — Inpatient Hospital Stay (HOSPITAL_COMMUNITY): Payer: BC Managed Care – PPO

## 2019-12-23 DIAGNOSIS — J9601 Acute respiratory failure with hypoxia: Secondary | ICD-10-CM

## 2019-12-23 LAB — CBC
HCT: 42 % (ref 39.0–52.0)
Hemoglobin: 14 g/dL (ref 13.0–17.0)
MCH: 31.9 pg (ref 26.0–34.0)
MCHC: 33.3 g/dL (ref 30.0–36.0)
MCV: 95.7 fL (ref 80.0–100.0)
Platelets: 136 10*3/uL — ABNORMAL LOW (ref 150–400)
RBC: 4.39 MIL/uL (ref 4.22–5.81)
RDW: 13.4 % (ref 11.5–15.5)
WBC: 10.7 10*3/uL — ABNORMAL HIGH (ref 4.0–10.5)
nRBC: 0 % (ref 0.0–0.2)

## 2019-12-23 LAB — BASIC METABOLIC PANEL
Anion gap: 16 — ABNORMAL HIGH (ref 5–15)
BUN: 19 mg/dL (ref 8–23)
CO2: 18 mmol/L — ABNORMAL LOW (ref 22–32)
Calcium: 8.3 mg/dL — ABNORMAL LOW (ref 8.9–10.3)
Chloride: 98 mmol/L (ref 98–111)
Creatinine, Ser: 1.33 mg/dL — ABNORMAL HIGH (ref 0.61–1.24)
GFR calc Af Amer: >60 mL/min (ref >60)
GFR calc non Af Amer: 56 mL/min — ABNORMAL LOW (ref >60)
Glucose, Bld: 103 mg/dL — ABNORMAL HIGH (ref 70–99)
Potassium: 3.7 mmol/L (ref 3.5–5.1)
Sodium: 132 mmol/L — ABNORMAL LOW (ref 135–145)

## 2019-12-23 LAB — LACTIC ACID, PLASMA
Lactic Acid, Venous: 1.3 mmol/L (ref 0.5–1.9)
Lactic Acid, Venous: 1.3 mmol/L (ref 0.5–1.9)

## 2019-12-23 LAB — MAGNESIUM: Magnesium: 2 mg/dL (ref 1.7–2.4)

## 2019-12-23 LAB — PROCALCITONIN: Procalcitonin: 0.69 ng/mL

## 2019-12-23 LAB — GLUCOSE, CAPILLARY
Glucose-Capillary: 95 mg/dL (ref 70–99)
Glucose-Capillary: 154 mg/dL — ABNORMAL HIGH (ref 70–99)
Glucose-Capillary: 155 mg/dL — ABNORMAL HIGH (ref 70–99)
Glucose-Capillary: 173 mg/dL — ABNORMAL HIGH (ref 70–99)

## 2019-12-23 LAB — CK: Total CK: 131 U/L (ref 49–397)

## 2019-12-23 MED ORDER — POTASSIUM CHLORIDE CRYS ER 20 MEQ PO TBCR
40.0000 meq | EXTENDED_RELEASE_TABLET | Freq: Once | ORAL | Status: AC
  Administered 2019-12-23: 40 meq via ORAL
  Filled 2019-12-23: qty 2

## 2019-12-23 NOTE — Progress Notes (Signed)
NAME:  [name], MRN:  [id number], DOB:  11-10-1955, LOS: 2 ADMISSION DATE:  12/21/2019, CONSULTATION DATE:  5/6 REFERRING MD:  Regalado (TRH), CHIEF COMPLAINT:  Dyspnea    Brief History   64yo male former smoker (quit 14 years ago, >52 pack year hx) with hx CAD, HTN, DM, OSA (noncompliant with CPAP), chronic systolic CHF and VT on amiodarone, ischemic cardiomyopathy recent high res CT chest suggestive of ILD (UIP) presented 5/5 with 4 day hx cough, SOB, chills.  He was admitted by [facility], CXR concerning for RLL PNA. Treated with rocephin/azithro and PCCM consulted for further assistance with ?ILD.   History of present illness   64yo male with hx CAD, HTN, DM, OSA, chronic systolic CHF and VT on amiodarone, ischemic cardiomyopathy recent high res CT chest suggestive of ILD (UIP) presented 5/5 with 4 day hx cough, SOB, chills.  He was admitted by [facility], CXR concerning for RLL PNA. Treated with rocephin/azithro and PCCM consulted for further assistance with ?ILD.   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    Past Medical History   has a past medical history of ASCVD (arteriosclerotic cardiovascular disease), Chronic bronchitis ([place]), HTN (hypertension), Hyperlipidemia, Ischemic cardiomyopathy, OSA (obstructive sleep apnea) (05/24/11), Restless leg syndrome (06/25/2011), and Type 2 diabetes mellitus ([place]).   Significant Hospital Events   Admitted 5/6  Consults:  Cardiology   Procedures:    Significant Diagnostic Tests:  HRCT chest 4/27 (pta) > Spectrum of findings compatible with basilar predominant fibrotic interstitial lung disease with mild honeycombing, with interval progression since 01/04/2019 chest CT. Findings are consistent with UIP per consensus guidelines:   Micro Data:  BC x3 5/5 > covid 5/5 > neg  Sputum  5/5 >  Antimicrobials:  Rocephin 5/5 > azithro 5/5 >  Interim history/subjective:  Spiked fever of 103.1 with increased oxygen demand afternoon or 5/6 requiring escalation of antibiotics. Patient states he feels better today with no acute complaints this am. States he did not wear BIPAP overnight.   Objective   Blood pressure 122/70, pulse 66, temperature 98.3 F (36.8 C), temperature source Oral, resp. rate 20, height _0  (1.778 m), weight 96.9 kg, SpO2 93 %.        Intake/Output Summary (Last 24 hours) at 12/23/2019 0845 Last data filed at 12/22/2019 1747 Gross per 24 hour  Intake 100 ml  Output --  Net 100 ml   Filed Weights   12/21/19 1254 12/22/19 0606 12/23/19 0514  Weight: 98.9 kg 99.2 kg 96.9 kg    Examination: General: Very pleasant middle aged gentleman seen lying in bed in NAD HEENT: [place]/AT, MM pink/moist, PERRL Neuro: Alert and oriented x3 CV: s1s2 regular rate and rhythm, no murmur, rubs, or gallops,  PULM:  No increased work of breathing, faint crackles to bilateral bases, oxygen saturations 89-93 on 4-5L [place] GI: soft, bowel sounds active in all 4 quadrants, non-tender, non-distended Extremities: warm/dry, no edema  Skin: no rashes or lesions  Assessment & Plan:  64 year old male presented with cough and chills for crying admission for community-acquired pneumonia.  Outpatient high-resolution CT scan showing UIP fibrosis for which PCCM was consulted  Acute hypoxic respiratory failure  - multifactorial in setting CAP with probable underlying ILD and underlying OSA.   P: Continue broad spectrum antibiotics Follow cultures Supplemental  oxygen for SPO2 goal greater than 92% Sputum culture pending collection Procalcitonin 0.29 > 0.69 Antifibrotic therapy started 5/6 Follow connective tissue serologies  Will need to follow up with pulmonary clinic upon discharge  ESR 44 Amiodarone on hold, will defer to cardiology for resumption Continue home lasix   Best  practice:  Diet: carb mod diet  Pain/Anxiety/Delirium protocol (if indicated): n/a  VAP protocol (if indicated): n/a DVT prophylaxis: lovenox GI prophylaxis: n/a Glucose control: SSI Mobility: OOB Code Status: full Family Communication: per primary  Disposition: Floor  Labs   CBC: Recent Labs  Lab 12/21/19 1304 12/23/19 0321  WBC 13.9* 10.7*  HGB 15.4 14.0  HCT 45.9 42.0  MCV 96.0 95.7  PLT 124* 136*    Basic Metabolic Panel: Recent Labs  Lab 12/21/19 1304 12/22/19 0045 12/23/19 0321  NA 132* 132* 132*  K 4.2 3.7 3.7  CL 98 101 98  CO2 19* 20* 18*  GLUCOSE 238* 207* 103*  BUN _0 CREATININE 1.33* 1.35* 1.33*  CALCIUM 8.9 8.1* 8.3*   GFR: Estimated Creatinine Clearance: 65.6 mL/min (A) (by C-G formula based on SCr of 1.33 mg/dL (H)). Recent Labs  Lab 12/21/19 1304 12/21/19 1722 12/21/19 2036 12/22/19 0045 12/23/19 0321  PROCALCITON  --   --   --  0.29 0.69  WBC 13.9*  --   --   --  10.7*  LATICACIDVEN  --  2.0* 1.2  --   --     Liver Function Tests: No results for input(s): AST, ALT, ALKPHOS, BILITOT, PROT, ALBUMIN in the last 168 hours. No results for input(s): LIPASE, AMYLASE in the last 168 hours. No results for input(s): AMMONIA in the last 168 hours.  ABG    Component Value Date/Time   PHART 7.366 01/06/2019 0840   PCO2ART 35.7 01/06/2019 0840   PO2ART 70.0 (L) 01/06/2019 0840   HCO3 20.4 01/06/2019 0840   TCO2 22 01/06/2019 0840   ACIDBASEDEF 4.0 (H) 01/06/2019 0840   O2SAT 93.0 01/06/2019 0840     Coagulation Profile: No results for input(s): INR, PROTIME in the last 168 hours.  Cardiac Enzymes: Recent Labs  Lab 12/23/19 0321  CKTOTAL 131    HbA1C: Hgb A1c MFr Bld  Date/Time Value Ref Range Status  12/22/2019 12:45 AM 8.9 (H) 4.8 - 5.6 % Final    Comment:    (NOTE) Pre diabetes:          5.7%-6.4% Diabetes:              >6.4% Glycemic control for   <7.0% adults with diabetes   12/24/2017 12:24 PM 7.5 (H) 4.8 - 5.6  % Final    Comment:    (NOTE) Pre diabetes:          5.7%-6.4% Diabetes:              >6.4% Glycemic control for   <7.0% adults with diabetes     CBG: Recent Labs  Lab 12/22/19 0724 12/22/19 1132 12/22/19 1616 12/22/19 2052 12/23/19 0739  GLUCAP 93 131* 136* 91 95    Review of Systems:   As per HPI - All other systems reviewed and were neg.     Past Medical History  He,  has a past medical history of ASCVD (arteriosclerotic cardiovascular disease), Chronic bronchitis ([place]), HTN (hypertension), Hyperlipidemia, Ischemic cardiomyopathy, OSA (obstructive sleep apnea) (05/24/11), Restless leg syndrome (06/25/2011), and Type 2 diabetes mellitus ([place]).   Surgical History    Past Surgical History:  Procedure Laterality Date  . CARDIAC CATHETERIZATION  11/24/2012   40-50% ostial LAD, 40% mid LAD, 60-70% ostial D1, 40% proximal LCx, patent OM branch stent, 99% serial proximal-mid RCA lesions, 100% mid RCA occlusion (chronic with left to right collateralization); EF 40%, inferior wall hypokinesis.  Marland Kitchen CARDIAC CATHETERIZATION N/A 05/31/2015   Procedure: Left Heart Cath and Coronary Angiography;  Surgeon: Burnell Blanks, MD;  Location: [facility];  Service: Cardiovascular;  Laterality: N/A;  . CORONARY STENT INTERVENTION N/A 12/25/2017   Procedure: CORONARY STENT INTERVENTION;  Surgeon: Martinique, Peter M, MD;  Location: [facility];  Service: Cardiovascular;  Laterality: N/A;  . CORONARY STENT PLACEMENT  04/2011  . CYSTECTOMY     Spine and jaw  . ICD IMPLANT N/A 07/19/2018   Procedure: ICD IMPLANT;  Surgeon: Constance Haw, MD;  Location: [facility];  Service: Cardiovascular;  Laterality: N/A;  . LEFT HEART CATH AND CORONARY ANGIOGRAPHY N/A 12/25/2017   Procedure: LEFT HEART CATH AND CORONARY ANGIOGRAPHY;  Surgeon: Martinique, Peter M, MD;  Location: [facility];  Service: Cardiovascular;  Laterality: N/A;  . LEFT HEART CATHETERIZATION WITH CORONARY  ANGIOGRAM N/A 11/24/2012   Procedure: LEFT HEART CATHETERIZATION WITH CORONARY ANGIOGRAM;  Surgeon: Burnell Blanks, MD;  Location: [facility] LAB;  Service: Cardiovascular;  Laterality: N/A;  . lesion removed from foot     right foot  . RIGHT/LEFT HEART CATH AND CORONARY ANGIOGRAPHY N/A 01/06/2019   Procedure: RIGHT/LEFT HEART CATH AND CORONARY ANGIOGRAPHY;  Surgeon: Lorretta Harp, MD;  Location: [facility];  Service: Cardiovascular;  Laterality: N/A;  . TONSILLECTOMY AND ADENOIDECTOMY       Social History   reports that he quit smoking about 14 years ago. His smoking use included cigarettes. He has a 52.50 pack-year smoking history. He has never used smokeless tobacco. He reports that he does not drink alcohol or use drugs.   Family History   His family history includes Heart attack (age of onset: 35) in his brother; Heart attack (age of onset: 74) in his father; Lymphoma in his mother.   Allergies Allergies  Allergen Reactions  . Brilinta [Ticagrelor] Other (See Comments)    Intolerable dyspnea-changed to Plavix  . Codeine Other (See Comments)    Lightheaded, vision issues  . Lipitor [Atorvastatin Calcium] Other (See Comments)    Muscle pain  . Niaspan [Niacin Er] Other (See Comments)    Head hurts     Home Medications  Prior to Admission medications   Medication Sig Start Date End Date Taking? Authorizing Provider  acetaminophen (TYLENOL) 500 MG tablet Take 500 mg by mouth every 6 (six) hours as needed (for headaches.).   Yes [provider]  albuterol (VENTOLIN HFA) 108 (90 Base) MCG/ACT inhaler Inhale 1 puff into the lungs every 6 (six) hours as needed for wheezing or shortness of breath.   Yes [provider]  Alirocumab (PRALUENT) 75 MG/ML SOAJ Inject 75 mg into the skin every 14 (fourteen) days. 10/19/19  Yes Larey Dresser, MD  amiodarone (PACERONE) 200 MG tablet Take 1 tablet (200 mg total) by mouth daily. 07/18/19  Yes Minus Breeding, MD    aspirin 81 MG chewable tablet Chew 1 tablet (81 mg total) by mouth daily. 01/06/19  Yes Cheryln Manly, NP  carvedilol (COREG) 6.25 MG tablet Take 1 tablet (6.25 mg total) by mouth 2 (two) times daily with a meal. 07/18/19  Yes Minus Breeding, MD  CINNAMON PO Take 1,000 mg by mouth daily.   Yes [provider]  clopidogrel (PLAVIX) 75 MG tablet TAKE 1 TABLET BY MOUTH EVERY DAY 07/11/19  Yes Minus Breeding, MD  Coenzyme Q10 (CO Q 10) 100 MG CAPS Take 100 mg by mouth daily.   Yes [provider]  furosemide (LASIX) 40 MG tablet Take 1 tablet (40 mg total) by mouth 2 (two) times daily. Take 1 tablet daily 10/17/19  Yes Larey Dresser, MD  icosapent Ethyl (VASCEPA) 1 g capsule Take 1 g by mouth 2 (two) times daily.   Yes [provider]  JARDIANCE 10 MG TABS tablet Take 10 mg by mouth daily. 10/05/17  Yes [provider]  magnesium oxide (MAG-OX) 400 MG tablet Take 400 mg by mouth 2 (two) times daily.   Yes [provider]  metFORMIN (GLUCOPHAGE) 1000 MG tablet Take 1,000 mg by mouth 2 (two) times daily. 10/05/17  Yes [provider]  nitroGLYCERIN (NITROSTAT) 0.4 MG SL tablet Place 1 tablet (0.4 mg total) under the tongue every 5 (five) minutes as needed for chest pain. 04/20/15  Yes Minus Breeding, MD  pantoprazole (PROTONIX) 40 MG tablet Take 40 mg by mouth every evening.    Yes [provider]  pramipexole (MIRAPEX) 1 MG tablet Take 0.5 mg by mouth 4 (four) times daily.    Yes [provider]  predniSONE (DELTASONE) 20 MG tablet Take 20 mg by mouth 2 (two) times daily with a meal.   Yes [provider]  sacubitril-valsartan (ENTRESTO) 97-103 MG Take 1 tablet by mouth 2 (two) times daily. 07/18/19  Yes Minus Breeding, MD  spironolactone (ALDACTONE) 25 MG tablet Take 1 tablet (25 mg total) by mouth daily. 10/17/19  Yes Larey Dresser, MD  TOUJEO SOLOSTAR 300 UNIT/ML SOPN Inject 20 Units into the skin at bedtime.   10/14/18  Yes [provider]     Signature  Johnsie Cancel, NP-C [facility] / Pager information can be found on Amion  12/23/2019, 8:57 AM

## 2019-12-23 NOTE — Progress Notes (Signed)
PROGRESS NOTE    [name]  UXL:[id number] DOB: Feb 28, 1956 DOA: 12/21/2019 PCP: Hal Morales, NP   Brief Narrative: 64 year old with past medical history significant for ASCVD, hypertension, hyperlipidemia, diabetes type 2, ICP/V. tach status post ICD, OSA, pulmonary fibrosis and RLS, who presented with shortness of breath.  He also complained of fatigue and weakness.  He reports fever chills cough and shortness of breath.  Reports dry cough no productive sputum.  He completed his vaccination for Covid.  His primary care doctor prescribed him prednisone for which he took for 1 day. Chest x-ray showed airspace opacity right lower lung region, superimposed on fibrosis.  Suspect degree of pneumonia superimposed on fibrosis.  Patient has been admitted for community-acquired pneumonia.  Assessment & Plan:   Active Problems:   Community acquired pneumonia   Acute respiratory failure with hypoxia (HCC)    1-Acute hypoxic respiratory failure: Secondary to pneumonia, underlying pulmonary fibrosis: Patient presented with worsening shortness of breath, cough chills, leukocytosis.  Influenza panel and Covid negative. -Continue with IV azithromycin. Change ceftriaxone to Cefepime 12/22/2019 -Continue with oral Lasix. Monitor on oral lasix -Continue with nebulizer, albuterol.  -CCM consulted. Needs to establish care with Pulmonologist. Worsening hypoxemia yesterday, antibiotics change. Monitor closely.  -WBC trending down.   2-Sepsis Secondary  to Community-acquired pneumonia: Presented with chills, leukocytosis, low-grade temperature Continue with  azithromycin.  Spike  Fever 5/06, change ceftriaxone to cefepime.  Gap increase, monitor on lasix. Check lactic acid/ repeat chest x ray concern for hypoxemia, lactic acidosis.   3-CAD status post PCI, ICM/V. tach status post ICD, chronic systolic and diastolic heart failure: Continue aspirin, clopidogrel, , carvedilol Continue with Lasix. HF  managing medications.  Amiodarone change to Mexitil. Check mg level.   4-AKI; monitor on lasix. Cr stable.   5-HLD: On alirocumab and icosapent ethyl.  6-Hepatic steatosis; needs to loose weight.  Diabetes type 2: Continue with a sliding scale insulin RLS: Continue with pramipexole Obesity BMI 31:   Estimated body mass index is 30.65 kg/m as calculated from the following:   Height as of this encounter: 5\' 10"  (1.778 m).   Weight as of this encounter: 96.9 kg.   DVT prophylaxis: Lovenox Code Status: Full code Family Communication: Care discussed with patient.  Disposition Plan:  Patient is from: Home  Anticipated d/c date: 2-3 days Anticipated discharge to ; Home Barriers to d/c or necessity for inpatient status: admitted with acute hypoxic resp failure, PNA,. Still requiring high flow oxygen.   Consultants:   Pulmonary  Cardiology  Procedures:   none  Antimicrobials:  Ceftriaxone and azithromycin.   Subjective: He is feeling better than yesterday. Still experiencing dry cough. Denies chest pain. No fever since last night  Objective: Vitals:   12/23/19 0514 12/23/19 0741 12/23/19 0817 12/23/19 0820  BP: (!) 110/58 122/70    Pulse: 69 66    Resp: 19 20    Temp: 99.5 F (37.5 C) (!) 97.5 F (36.4 C) 98.3 F (36.8 C)   TempSrc: Oral  Oral   SpO2: 91% (!) 89% 92% 93%  Weight: 96.9 kg     Height:        Intake/Output Summary (Last 24 hours) at 12/23/2019 0924 Last data filed at 12/22/2019 1747 Gross per 24 hour  Intake 100 ml  Output --  Net 100 ml   Filed Weights   12/21/19 1254 12/22/19 0606 12/23/19 0514  Weight: 98.9 kg 99.2 kg 96.9 kg    Examination:  General  exam: NAD, Alert  Respiratory system: Normal respiratory effort. Fine crackles bases Cardiovascular system: S 1, S 2 RRR Gastrointestinal system: BS present, soft, nt Central nervous system: Non focal. . Extremities: Symmetric power Skin: No rashes  Data Reviewed: I have personally  reviewed following labs and imaging studies  CBC: Recent Labs  Lab 12/21/19 1304 12/23/19 0321  WBC 13.9* 10.7*  HGB 15.4 14.0  HCT 45.9 42.0  MCV 96.0 95.7  PLT 124* 136*   Basic Metabolic Panel: Recent Labs  Lab 12/21/19 1304 12/22/19 0045 12/23/19 0321  NA 132* 132* 132*  K 4.2 3.7 3.7  CL 98 101 98  CO2 19* 20* 18*  GLUCOSE 238* 207* 103*  BUN 21 19 19   CREATININE 1.33* 1.35* 1.33*  CALCIUM 8.9 8.1* 8.3*   GFR: Estimated Creatinine Clearance: 65.6 mL/min (A) (by C-G formula based on SCr of 1.33 mg/dL (H)). Liver Function Tests: No results for input(s): AST, ALT, ALKPHOS, BILITOT, PROT, ALBUMIN in the last 168 hours. No results for input(s): LIPASE, AMYLASE in the last 168 hours. No results for input(s): AMMONIA in the last 168 hours. Coagulation Profile: No results for input(s): INR, PROTIME in the last 168 hours. Cardiac Enzymes: Recent Labs  Lab 12/23/19 0321  CKTOTAL 131   BNP (last 3 results) Recent Labs    03/31/19 1557  PROBNP 55   HbA1C: Recent Labs    12/22/19 0045  HGBA1C 8.9*   CBG: Recent Labs  Lab 12/22/19 0724 12/22/19 1132 12/22/19 1616 12/22/19 2052 12/23/19 0739  GLUCAP 93 131* 136* 91 95   Lipid Profile: No results for input(s): CHOL, HDL, LDLCALC, TRIG, CHOLHDL, LDLDIRECT in the last 72 hours. Thyroid Function Tests: No results for input(s): TSH, T4TOTAL, FREET4, T3FREE, THYROIDAB in the last 72 hours. Anemia Panel: No results for input(s): VITAMINB12, FOLATE, FERRITIN, TIBC, IRON, RETICCTPCT in the last 72 hours. Sepsis Labs: Recent Labs  Lab 12/21/19 1722 12/21/19 2036 12/22/19 0045 12/23/19 0321  PROCALCITON  --   --  0.29 0.69  LATICACIDVEN 2.0* 1.2  --   --     Recent Results (from the past 240 hour(s))  Culture, blood (routine x 2)     Status: None (Preliminary result)   Collection Time: 12/21/19  5:19 PM   Specimen: BLOOD  Result Value Ref Range Status   Specimen Description BLOOD RIGHT ANTECUBITAL   Final   Special Requests   Final    BOTTLES DRAWN AEROBIC AND ANAEROBIC Blood Culture results may not be optimal due to an excessive volume of blood received in culture bottles   Culture   Final    NO GROWTH 2 DAYS Performed at [facility], [address] [place], [place]    Report Status PENDING  Incomplete  Respiratory Panel by RT PCR (Flu A&B, Covid) - Nasopharyngeal Swab     Status: None   Collection Time: 12/21/19  6:19 PM   Specimen: Nasopharyngeal Swab  Result Value Ref Range Status   SARS Coronavirus 2 by RT PCR NEGATIVE NEGATIVE Final    Comment: (NOTE) SARS-CoV-2 target nucleic acids are NOT DETECTED. The SARS-CoV-2 RNA is generally detectable in upper respiratoy specimens during the acute phase of infection. The lowest concentration of SARS-CoV-2 viral copies this assay can detect is 131 copies/mL. A negative result does not preclude SARS-Cov-2 infection and should not be used as the sole basis for treatment or other patient management decisions. A negative result may occur with  improper specimen collection/handling,  submission of specimen other than nasopharyngeal swab, presence of viral mutation(s) within the areas targeted by this assay, and inadequate number of viral copies (<131 copies/mL). A negative result must be combined with clinical observations, patient history, and epidemiological information. The expected result is Negative. Fact Sheet for Patients:  PinkCheek.be Fact Sheet for Healthcare Providers:  GravelBags.it This test is not yet ap proved or cleared by the [place] FDA and  has been authorized for detection and/or diagnosis of SARS-CoV-2 by FDA under an Emergency Use Authorization (EUA). This EUA will remain  in effect (meaning this test can be used) for the duration of the COVID-19 declaration under Section 564(b)(1) of the Act, 21 U.S.C. section 360bbb-3(b)(1), unless  the authorization is terminated or revoked sooner.    Influenza A by PCR NEGATIVE NEGATIVE Final   Influenza B by PCR NEGATIVE NEGATIVE Final    Comment: (NOTE) The Xpert Xpress SARS-CoV-2/FLU/RSV assay is intended as an aid in  the diagnosis of influenza from Nasopharyngeal swab specimens and  should not be used as a sole basis for treatment. Nasal washings and  aspirates are unacceptable for Xpert Xpress SARS-CoV-2/FLU/RSV  testing. Fact Sheet for Patients: PinkCheek.be Fact Sheet for Healthcare Providers: GravelBags.it This test is not yet approved or cleared by the [place] FDA and  has been authorized for detection and/or diagnosis of SARS-CoV-2 by  FDA under an Emergency Use Authorization (EUA). This EUA will remain  in effect (meaning this test can be used) for the duration of the  Covid-19 declaration under Section 564(b)(1) of the Act, 21  U.S.C. section 360bbb-3(b)(1), unless the authorization is  terminated or revoked. Performed at [facility], [place] [address], [place], [place] [address]   Culture, blood (routine x 2)     Status: None (Preliminary result)   Collection Time: 12/21/19  8:36 PM   Specimen: BLOOD RIGHT HAND  Result Value Ref Range Status   Specimen Description BLOOD RIGHT HAND  Final   Special Requests   Final    BOTTLES DRAWN AEROBIC AND ANAEROBIC Blood Culture adequate volume PT ON ROCEPHIN ZITHROMAX   Culture   Final    NO GROWTH 2 DAYS Performed at [facility], [place] [address], [place], [place] [address]    Report Status PENDING  Incomplete  MRSA PCR Screening     Status: None   Collection Time: 12/22/19  6:53 PM   Specimen: Nasal Mucosa; Nasopharyngeal  Result Value Ref Range Status   MRSA by PCR NEGATIVE NEGATIVE Final    Comment:        The GeneXpert MRSA Assay (FDA approved for NASAL specimens only), is one component of a comprehensive MRSA colonization surveillance  program. It is not intended to diagnose MRSA infection nor to guide or monitor treatment for MRSA infections. Performed at [facility], [place] [address] [place], [place] [address]          Radiology Studies: DG Chest 2 View  Result Date: 12/21/2019 CLINICAL DATA:  Shortness of breath.  Fever. EXAM: CHEST - 2 VIEW COMPARISON:  November 08, 2019 chest radiograph; chest CT December 13, 2019 FINDINGS: There is underlying fibrotic change. There is scarring in the upper lobe regions, better seen on CT. In comparison with prior studies, there is ill-defined airspace opacity superimposed on fibrotic type change. No similar changes of this nature elsewhere. Heart size and pulmonary vascularity are normal. Pacemaker lead is attached to the right ventricle. No adenopathy. There is  degenerative change in the lower thoracic and upper lumbar regions. IMPRESSION: Airspace opacity right lower lung region, superimposed on fibrosis. Suspect a degree of pneumonia superimposed on fibrosis. Fibrotic changes elsewhere appear stable. Scarring in the upper lobes is better seen on CT than by radiography. Cardiac silhouette within normal limits. Pacemaker lead attached to right ventricle. No adenopathy appreciable by radiography. Electronically Signed   By: Bretta Bang III M.D.   On: 12/21/2019 13:24        Scheduled Meds: . albuterol  2.5 mg Inhalation QID  . aspirin  81 mg Oral Daily  . carvedilol  6.25 mg Oral BID WC  . clopidogrel  75 mg Oral Daily  . enoxaparin (LOVENOX) injection  40 mg Subcutaneous Daily  . furosemide  40 mg Oral BID  . icosapent Ethyl  1 g Oral BID  . insulin aspart  0-15 Units Subcutaneous TID WC  . insulin aspart  0-5 Units Subcutaneous QHS  . mexiletine  150 mg Oral Q12H  . pantoprazole  40 mg Oral QPM  . potassium chloride  40 mEq Oral Once  . pramipexole  0.5 mg Oral TID AC & HS  . sacubitril-valsartan  1 tablet Oral BID  . sodium chloride flush  3 mL Intravenous Once    . sodium chloride flush  3 mL Intravenous Q12H  . spironolactone  25 mg Oral Daily   Continuous Infusions: . azithromycin 500 mg (12/22/19 1747)  . ceFEPime (MAXIPIME) IV 2 g (12/23/19 0521)     LOS: 2 days    Time spent: 35 minutes.     Alba Cory, MD [facility]   If 7PM-7AM, please contact night-coverage [email or web address]  12/23/2019, 9:24 AM

## 2019-12-23 NOTE — Plan of Care (Signed)
met 

## 2019-12-23 NOTE — Progress Notes (Addendum)
Patient ID: [name], male   DOB: 1956-07-08, 64 y.o.   MRN: [id number]     Advanced Heart Failure Rounding Note  PCP-Cardiologist: Rollene Rotunda, MD   Subjective:    Tm 99.5, BP stable.  WBCs 10.7.  He is on cefepime + azithromycin for PNA.   He remains on 5L [place], says he is comfortable.    Objective:   Weight Range: 96.9 kg Body mass index is 30.65 kg/m.   Vital Signs:   Temp:  [97.5 F (36.4 C)-103.1 F (39.5 C)] 98.3 F (36.8 C) (05/07 0817) Pulse Rate:  [64-74] 66 (05/07 0741) Resp:  [18-24] 20 (05/07 0741) BP: (92-125)/(53-70) 122/70 (05/07 0741) SpO2:  [89 %-95 %] 93 % (05/07 0820) Weight:  [96.9 kg] 96.9 kg (05/07 0514) Last BM Date: 12/23/19  Weight change: Filed Weights   12/21/19 1254 12/22/19 0606 12/23/19 0514  Weight: 98.9 kg 99.2 kg 96.9 kg    Intake/Output:   Intake/Output Summary (Last 24 hours) at 12/23/2019 1102 Last data filed at 12/22/2019 1747 Gross per 24 hour  Intake 100 ml  Output --  Net 100 ml      Physical Exam    General:  Well appearing. No resp difficulty HEENT: Normal Neck: Supple. JVP not elevated. Carotids 2+ bilat; no bruits. No lymphadenopathy or thyromegaly appreciated. Cor: PMI nondisplaced. Regular rate & rhythm. No rubs, gallops or murmurs. Lungs: Dry crackles Abdomen: Soft, nontender, nondistended. No hepatosplenomegaly. No bruits or masses. Good bowel sounds. Extremities: No cyanosis, clubbing, rash, edema Neuro: Alert & orientedx3, cranial nerves grossly intact. moves all 4 extremities w/o difficulty. Affect pleasant   Telemetry   NSR, rare PVCs (personally reviewed)  Labs    CBC Recent Labs    12/21/19 1304 12/23/19 0321  WBC 13.9* 10.7*  HGB 15.4 14.0  HCT 45.9 42.0  MCV 96.0 95.7  PLT 124* 136*   Basic Metabolic Panel Recent Labs    70/01/74 0045 12/23/19 0321 12/23/19 0952  NA 132* 132*  --   K 3.7 3.7  --   CL 101 98  --   CO2 20* 18*  --   GLUCOSE 207* 103*  --   BUN 19 19  --    CREATININE 1.35* 1.33*  --   CALCIUM 8.1* 8.3*  --   MG  --   --  2.0   Liver Function Tests No results for input(s): AST, ALT, ALKPHOS, BILITOT, PROT, ALBUMIN in the last 72 hours. No results for input(s): LIPASE, AMYLASE in the last 72 hours. Cardiac Enzymes Recent Labs    12/23/19 0321  CKTOTAL 131    BNP: BNP (last 3 results) Recent Labs    01/04/19 1433 10/17/19 1452  BNP 187.1* 46.9    ProBNP (last 3 results) Recent Labs    03/31/19 1557  PROBNP 55     D-Dimer No results for input(s): DDIMER in the last 72 hours. Hemoglobin A1C Recent Labs    12/22/19 0045  HGBA1C 8.9*   Fasting Lipid Panel No results for input(s): CHOL, HDL, LDLCALC, TRIG, CHOLHDL, LDLDIRECT in the last 72 hours. Thyroid Function Tests No results for input(s): TSH, T4TOTAL, T3FREE, THYROIDAB in the last 72 hours.  Invalid input(s): FREET3  Other results:   Imaging    DG Chest 2 View  Result Date: 12/23/2019 CLINICAL DATA:  Hypoxia, coronary vascular disease, sleep apnea, chronic bronchitis EXAM: CHEST - 2 VIEW COMPARISON:  12/21/2019 FINDINGS: Stable cardiomegaly and asymmetric chronic appearing interstitial opacities, worse  in the right lower lung favored to be chronic interstitial lung disease. No significant interval change in lung aeration. No enlarging effusion or pneumothorax. Left subclavian AICD noted. No new collapse or consolidation. IMPRESSION: Similar cardiomegaly and chronic interstitial lung disease pattern. Electronically Signed   By: Jerilynn Mages.  Shick M.D.   On: 12/23/2019 10:37      Medications:     Scheduled Medications: . albuterol  2.5 mg Inhalation QID  . aspirin  81 mg Oral Daily  . carvedilol  6.25 mg Oral BID WC  . clopidogrel  75 mg Oral Daily  . enoxaparin (LOVENOX) injection  40 mg Subcutaneous Daily  . furosemide  40 mg Oral BID  . icosapent Ethyl  1 g Oral BID  . insulin aspart  0-15 Units Subcutaneous TID WC  . insulin aspart  0-5 Units Subcutaneous  QHS  . mexiletine  150 mg Oral Q12H  . pantoprazole  40 mg Oral QPM  . potassium chloride  40 mEq Oral Once  . pramipexole  0.5 mg Oral TID AC & HS  . sacubitril-valsartan  1 tablet Oral BID  . sodium chloride flush  3 mL Intravenous Once  . sodium chloride flush  3 mL Intravenous Q12H  . spironolactone  25 mg Oral Daily     Infusions: . azithromycin 500 mg (12/22/19 1747)  . ceFEPime (MAXIPIME) IV 2 g (12/23/19 0521)     PRN Medications:  acetaminophen, albuterol, nitroGLYCERIN, senna-docusate   Assessment/Plan   1. PNA: CXR with RLL PNA, fever at admission.  PCT 0.29 => 0.69.  No culture data yet.  This has occurred on a background of ILD.  - Continue cefepime/azithromycin per CCM/primary service.  2. Interstitial lung disease: Possible UIP.  Present on high resolution CT chest done prior to admission.  Discussed with Dr. Vaughan Browner, probably not amiodarone-related, but given significant lung parenchymal disease, we decided to stop amiodarone.  - Now off amiodarone.  - He will start anti-fibrotic med as outpatient.  3. Chronic systolic CHF: Echo in 3/15 with EF 20-25%, but echo in 2/21 looked better, with EF 35-40%. CPX in 3/21 with moderate HF limitation. Has [employer] ICD.  He is not volume overloaded on exam.  Suspect main issue here is pulmonary.  - Continue home Lasix 40 mg po bid.  - Continue Entresto 49/51 bid, can increase back to home dose 97/103 bid as BP allows . - Continue spironolactone 25 daily.  - Continue Coreg 6.25 mg bid.  4. H/o VT/frequent PVCs: As above, will stop amiodarone.  He has started instead on mexiletine 150 mg bid.  So far, minimal ectopy.  5. CAD: LHC 12/2018 showed chronically occluded RCA w/ patent LCx system stents. No interventional targets.  No chest pain.  - continue ASA + Plavix + ? blocker - on PCSK9i (Praluent) at home - on Vascepa for hypertriglyceridemia   Cardiology will see again on Monday unless called.   Length of Stay:  2  Loralie Champagne, MD  12/23/2019, 11:02 AM  Advanced Heart Failure Team Pager [phone] (M-F; 7a - 4p)  Please contact [facility] for night-coverage after hours (4p -7a ) and weekends on [email or web address]

## 2019-12-23 NOTE — Progress Notes (Signed)
Patient does not want to wear the Bipap.

## 2019-12-23 NOTE — Progress Notes (Signed)
Patient resting well. Vital signs stable at this time. No signs of acute distress. RN will continue to monitor.

## 2019-12-24 LAB — GLUCOSE, CAPILLARY
Glucose-Capillary: 169 mg/dL — ABNORMAL HIGH (ref 70–99)
Glucose-Capillary: 154 mg/dL — ABNORMAL HIGH (ref 70–99)
Glucose-Capillary: 140 mg/dL — ABNORMAL HIGH (ref 70–99)
Glucose-Capillary: 284 mg/dL — ABNORMAL HIGH (ref 70–99)

## 2019-12-24 LAB — BASIC METABOLIC PANEL
Anion gap: 13 (ref 5–15)
BUN: 17 mg/dL (ref 8–23)
CO2: 19 mmol/L — ABNORMAL LOW (ref 22–32)
Calcium: 8.1 mg/dL — ABNORMAL LOW (ref 8.9–10.3)
Chloride: 102 mmol/L (ref 98–111)
Creatinine, Ser: 0.97 mg/dL (ref 0.61–1.24)
GFR calc Af Amer: >60 mL/min (ref >60)
GFR calc non Af Amer: >60 mL/min (ref >60)
Glucose, Bld: 144 mg/dL — ABNORMAL HIGH (ref 70–99)
Potassium: 4.1 mmol/L (ref 3.5–5.1)
Sodium: 134 mmol/L — ABNORMAL LOW (ref 135–145)

## 2019-12-24 LAB — EXPECTORATED SPUTUM ASSESSMENT W GRAM STAIN, RFLX TO RESP C: Special Requests: NORMAL

## 2019-12-24 LAB — ALDOLASE: Aldolase: 10 U/L (ref 3.3–10.3)

## 2019-12-24 LAB — RHEUMATOID FACTOR: Rheumatoid fact SerPl-aCnc: 12.9 IU/mL (ref 0.0–13.9)

## 2019-12-24 IMAGING — CR DG CHEST 2V
2 series · 2 of 2 positions shown · non-contrast
Comparison: 11/23/2012

CLINICAL DATA: Shortness of breath for 3 weeks, awoke this morning
with nausea, sensation of shoulder heaviness, slight jaw pain,
history of coronary disease post MI x 2, hypertension, diabetes
mellitus, former smoker

EXAM:
CHEST - 2 VIEW

[chest pa]
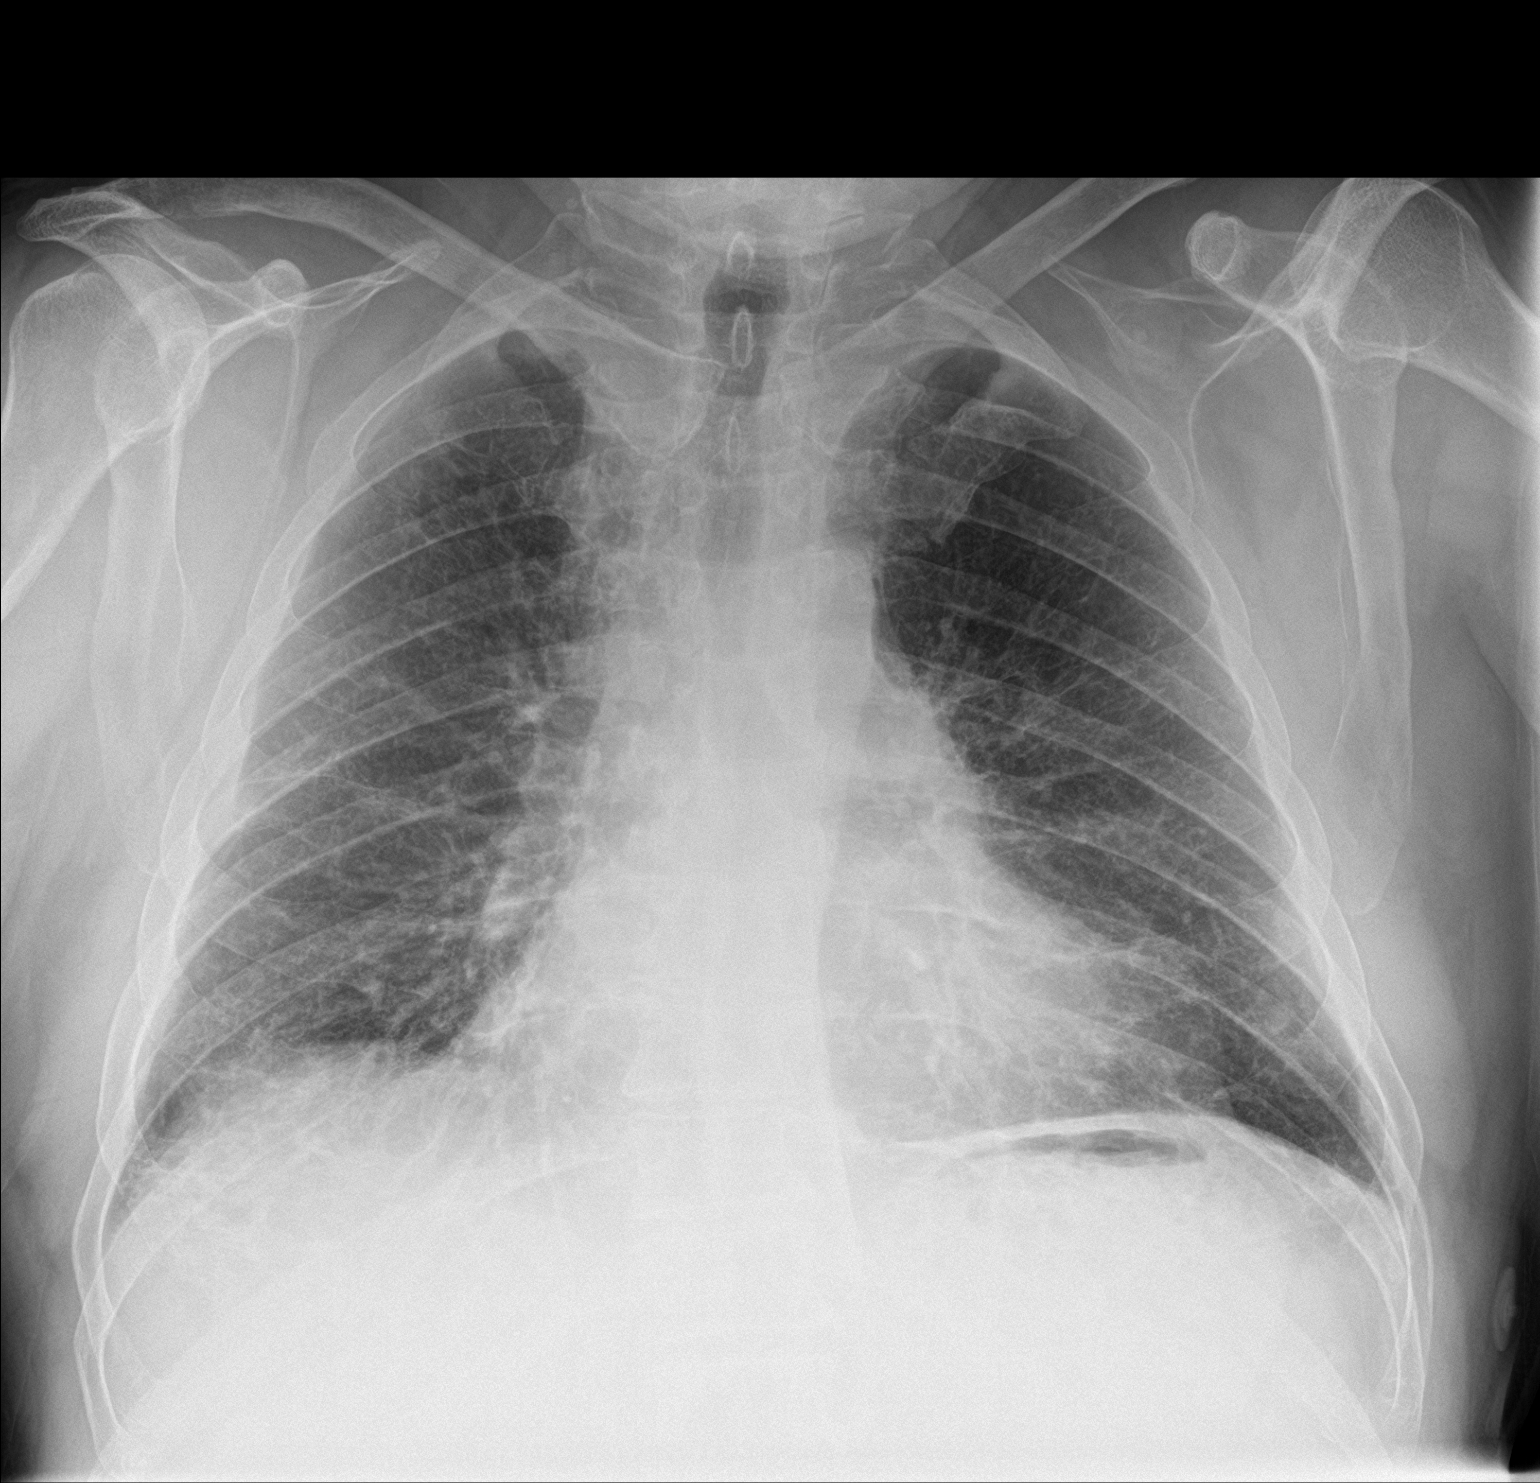

[chest lat]
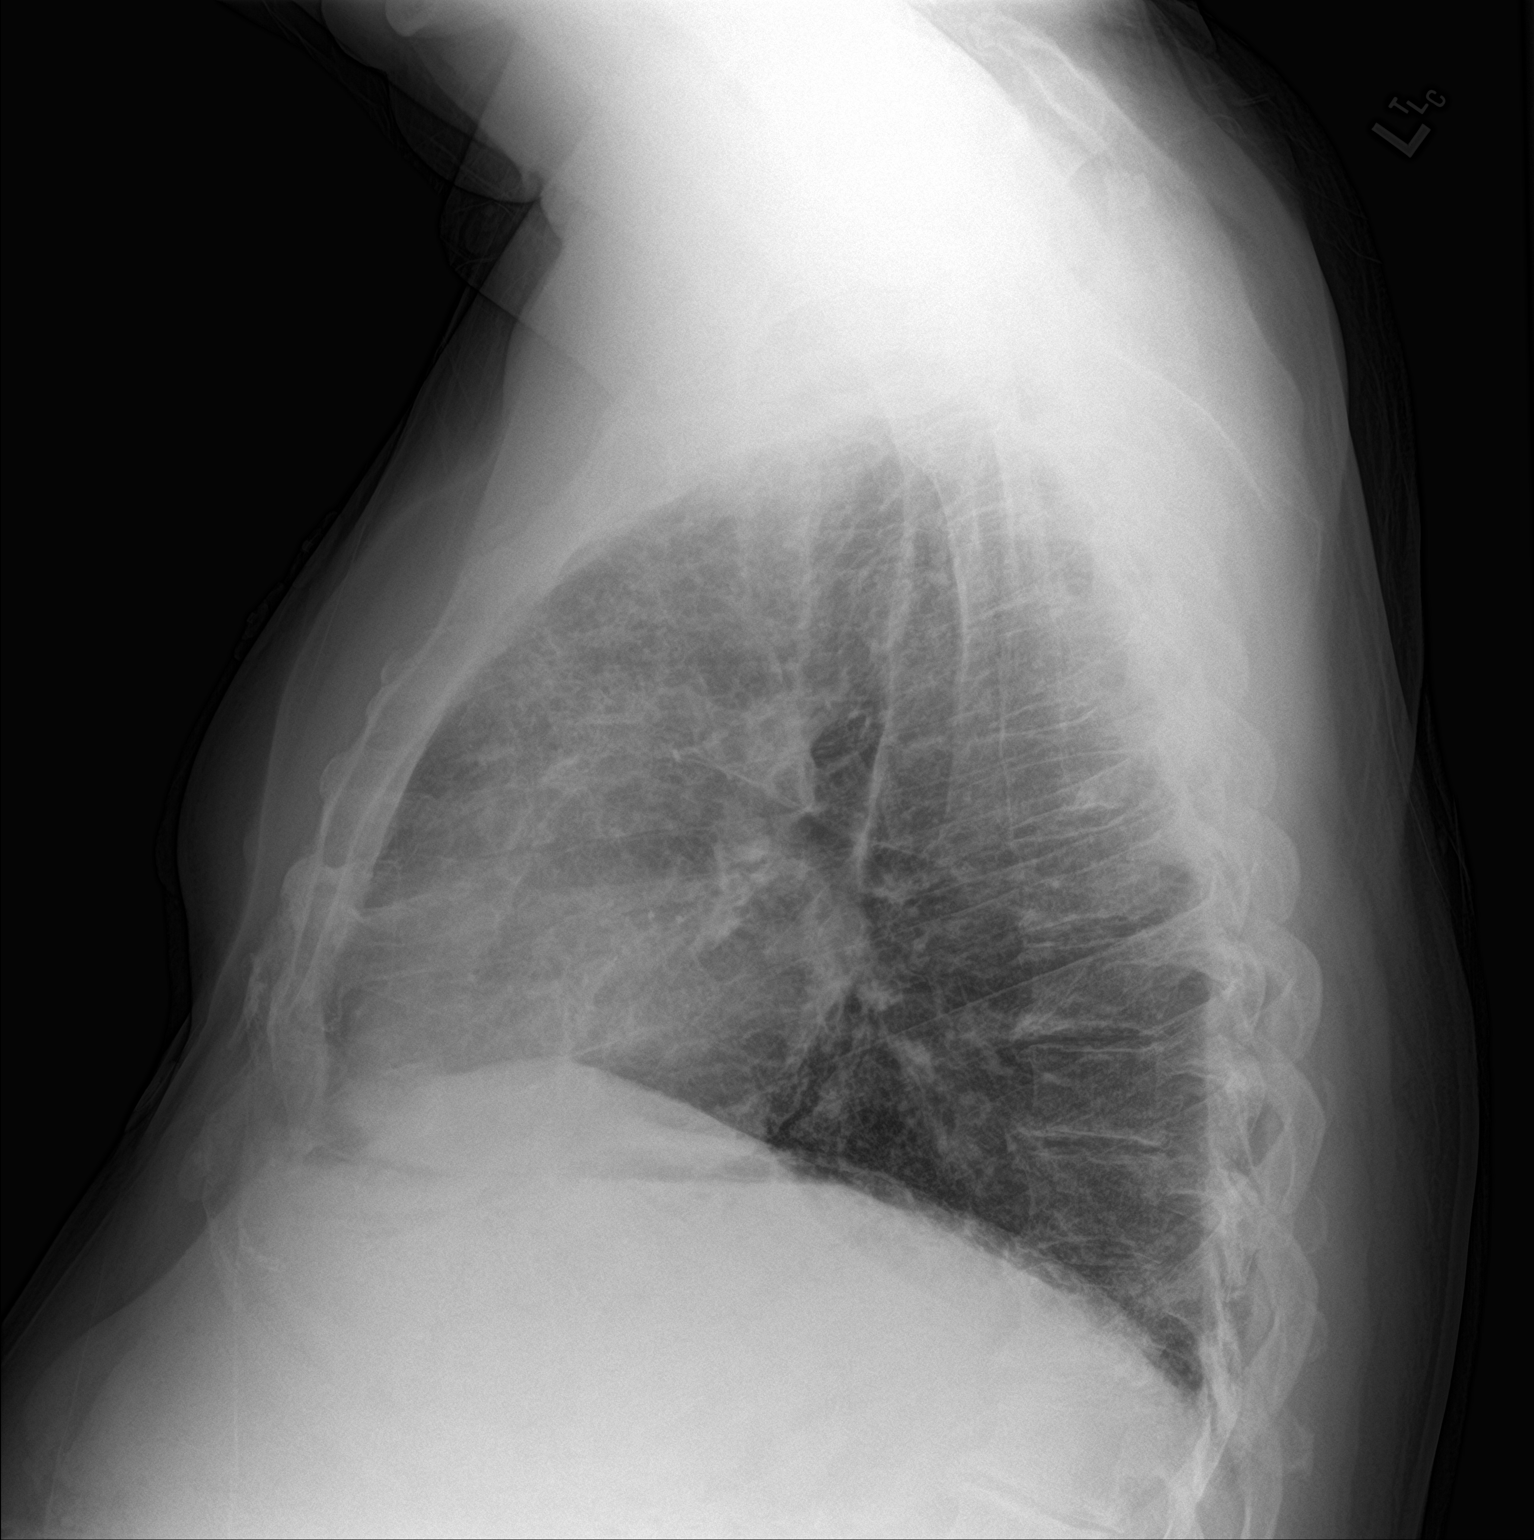

[2 of 2 positions shown; findings below may reference images not displayed]

FINDINGS: Enlargement of cardiac silhouette.

Mediastinal contours and pulmonary vascularity normal.

Slightly prominent superior mediastinal soft tissues at the thoracic
inlet appears stable.

Diffuse interstitial prominence in the mid to lower lungs
bilaterally slightly increased since previous exam, suspect mild
pulmonary edema.

No pleural effusion or pneumothorax.

Bones unremarkable.
IMPRESSION: Enlargement of cardiac silhouette with increased interstitial
markings in the mid to lower lungs increased since previous exam,
question mild pulmonary edema.

## 2019-12-24 NOTE — Plan of Care (Signed)
  Problem: Education: Goal: Knowledge of General Education information will improve Description Including pain rating scale, medication(s)/side effects and non-pharmacologic comfort measures Outcome: Progressing   

## 2019-12-24 NOTE — Progress Notes (Signed)
Patient refuses Bipap. 

## 2019-12-24 NOTE — Progress Notes (Signed)
PROGRESS NOTE    NIRAJ KUDRNA  QPR:[id number] DOB: 1955-11-02 DOA: 12/21/2019 PCP: Hal Morales, NP   Brief Narrative: 64 year old with past medical history significant for ASCVD, hypertension, hyperlipidemia, diabetes type 2, ICP/V. tach status post ICD, OSA, pulmonary fibrosis and RLS, who presented with shortness of breath.  He also complained of fatigue and weakness.  He reports fever chills cough and shortness of breath.  Reports dry cough no productive sputum.  He completed his vaccination for Covid.  His primary care doctor prescribed him prednisone for which he took for 1 day. Chest x-ray showed airspace opacity right lower lung region, superimposed on fibrosis.  Suspect degree of pneumonia superimposed on fibrosis.  Patient has been admitted for community-acquired pneumonia.  Assessment & Plan:   Active Problems:   Community acquired pneumonia   Acute respiratory failure with hypoxia (HCC)    1-Acute hypoxic respiratory failure: Secondary to pneumonia, underlying pulmonary fibrosis: Patient presented with worsening shortness of breath, cough chills, leukocytosis.  Influenza panel and Covid negative. -Continue with IV azithromycin. Change ceftriaxone to Cefepime 12/22/2019 -Continue with oral Lasix. Monitor on oral lasix -Continue with nebulizer, albuterol.  -CCM consulted. Needs to establish care with Pulmonologist. Worsening hypoxemia yesterday, antibiotics change. Monitor closely.  -WBC trending down.  Improving. Still requiring 4 L oxygen.   2-Sepsis Secondary  to Community-acquired pneumonia: Presented with chills, leukocytosis, low-grade temperature Continue with  azithromycin.  Spike  Fever 5/06, change ceftriaxone to cefepime.  Gap normal, lactic acid normal.   3-CAD status post PCI, ICM/V. tach status post ICD, chronic systolic and diastolic heart failure: Continue aspirin, clopidogrel, , carvedilol Continue with Lasix. HF managing medications.  Amiodarone  change to Mexitil. Check mg level.   4-AKI; monitor on lasix. Cr stable.   5-HLD: On alirocumab and icosapent ethyl.  6-Hepatic steatosis; needs to loose weight.  Diabetes type 2: Continue with a sliding scale insulin RLS: Continue with pramipexole Obesity BMI 31:   Estimated body mass index is 30.94 kg/m as calculated from the following:   Height as of this encounter: 5\' 10"  (1.778 m).   Weight as of this encounter: 97.8 kg.   DVT prophylaxis: Lovenox Code Status: Full code Family Communication: Care discussed with patient.  Disposition Plan:  Patient is from: Home  Anticipated d/c date: 2-3 days Anticipated discharge to ; Home Barriers to d/c or necessity for inpatient status: Still requiring Oxygen, plan to give another day of IV antibiotics, PT evaluation, evaluate for home oxygen   Consultants:   Pulmonary  Cardiology  Procedures:   none  Antimicrobials:  Ceftriaxone and azithromycin.   Subjective: He feels he is improving. Not at baseline for breathing   Objective: Vitals:   12/23/19 2146 12/23/19 2202 12/24/19 0228 12/24/19 0653  BP: 102/61  (!) 141/80 126/70  Pulse: 61 66 61 64  Resp: 17 18 18 16   Temp: 98.3 F (36.8 C)  97.9 F (36.6 C) (!) 97.4 F (36.3 C)  TempSrc: Oral     SpO2: 90% 90% 94% 95%  Weight:   97.8 kg   Height:        Intake/Output Summary (Last 24 hours) at 12/24/2019 0820 Last data filed at 12/24/2019 0648 Gross per 24 hour  Intake 1492.94 ml  Output 0 ml  Net 1492.94 ml   Filed Weights   12/22/19 0606 12/23/19 0514 12/24/19 0228  Weight: 99.2 kg 96.9 kg 97.8 kg    Examination:  General exam: NAD Respiratory system: Normal resp effort,  fine crackles bases Cardiovascular system: S 1, S 2 RRR Gastrointestinal system: BS present, soft, nt Central nervous system: Non focal.  Extremities: symmetric power  Data Reviewed: I have personally reviewed following labs and imaging studies  CBC: Recent Labs  Lab 12/21/19 1304  12/23/19 0321  WBC 13.9* 10.7*  HGB 15.4 14.0  HCT 45.9 42.0  MCV 96.0 95.7  PLT 124* 136*   Basic Metabolic Panel: Recent Labs  Lab 12/21/19 1304 12/22/19 0045 12/23/19 0321 12/23/19 0952 12/24/19 0357  NA 132* 132* 132*  --  134*  K 4.2 3.7 3.7  --  4.1  CL 98 101 98  --  102  CO2 19* 20* 18*  --  19*  GLUCOSE 238* 207* 103*  --  144*  BUN 21 19 19   --  17  CREATININE 1.33* 1.35* 1.33*  --  0.97  CALCIUM 8.9 8.1* 8.3*  --  8.1*  MG  --   --   --  2.0  --    GFR: Estimated Creatinine Clearance: 90.2 mL/min (by C-G formula based on SCr of 0.97 mg/dL). Liver Function Tests: No results for input(s): AST, ALT, ALKPHOS, BILITOT, PROT, ALBUMIN in the last 168 hours. No results for input(s): LIPASE, AMYLASE in the last 168 hours. No results for input(s): AMMONIA in the last 168 hours. Coagulation Profile: No results for input(s): INR, PROTIME in the last 168 hours. Cardiac Enzymes: Recent Labs  Lab 12/23/19 0321  CKTOTAL 131   BNP (last 3 results) Recent Labs    03/31/19 1557  PROBNP 55   HbA1C: Recent Labs    12/22/19 0045  HGBA1C 8.9*   CBG: Recent Labs  Lab 12/22/19 2052 12/23/19 0739 12/23/19 1133 12/23/19 1524 12/23/19 2142  GLUCAP 91 95 154* 155* 173*   Lipid Profile: No results for input(s): CHOL, HDL, LDLCALC, TRIG, CHOLHDL, LDLDIRECT in the last 72 hours. Thyroid Function Tests: No results for input(s): TSH, T4TOTAL, FREET4, T3FREE, THYROIDAB in the last 72 hours. Anemia Panel: No results for input(s): VITAMINB12, FOLATE, FERRITIN, TIBC, IRON, RETICCTPCT in the last 72 hours. Sepsis Labs: Recent Labs  Lab 12/21/19 1722 12/21/19 2036 12/22/19 0045 12/23/19 0321 12/23/19 0952 12/23/19 1229  PROCALCITON  --   --  0.29 0.69  --   --   LATICACIDVEN 2.0* 1.2  --   --  1.3 1.3    Recent Results (from the past 240 hour(s))  Culture, blood (routine x 2)     Status: None (Preliminary result)   Collection Time: 12/21/19  5:19 PM   Specimen:  BLOOD  Result Value Ref Range Status   Specimen Description BLOOD RIGHT ANTECUBITAL  Final   Special Requests   Final    BOTTLES DRAWN AEROBIC AND ANAEROBIC Blood Culture results may not be optimal due to an excessive volume of blood received in culture bottles   Culture   Final    NO GROWTH 3 DAYS Performed at [facility], [address] [place], [place]    Report Status PENDING  Incomplete  Respiratory Panel by RT PCR (Flu A&B, Covid) - Nasopharyngeal Swab     Status: None   Collection Time: 12/21/19  6:19 PM   Specimen: Nasopharyngeal Swab  Result Value Ref Range Status   SARS Coronavirus 2 by RT PCR NEGATIVE NEGATIVE Final    Comment: (NOTE) SARS-CoV-2 target nucleic acids are NOT DETECTED. The SARS-CoV-2 RNA is generally detectable in upper respiratoy specimens during the acute phase of  infection. The lowest concentration of SARS-CoV-2 viral copies this assay can detect is 131 copies/mL. A negative result does not preclude SARS-Cov-2 infection and should not be used as the sole basis for treatment or other patient management decisions. A negative result may occur with  improper specimen collection/handling, submission of specimen other than nasopharyngeal swab, presence of viral mutation(s) within the areas targeted by this assay, and inadequate number of viral copies (<131 copies/mL). A negative result must be combined with clinical observations, patient history, and epidemiological information. The expected result is Negative. Fact Sheet for Patients:  https://www.moore.com/ Fact Sheet for Healthcare Providers:  https://www.young.biz/ This test is not yet ap proved or cleared by the [place] FDA and  has been authorized for detection and/or diagnosis of SARS-CoV-2 by FDA under an Emergency Use Authorization (EUA). This EUA will remain  in effect (meaning this test can be used) for the duration of the COVID-19  declaration under Section 564(b)(1) of the Act, 21 U.S.C. section 360bbb-3(b)(1), unless the authorization is terminated or revoked sooner.    Influenza A by PCR NEGATIVE NEGATIVE Final   Influenza B by PCR NEGATIVE NEGATIVE Final    Comment: (NOTE) The Xpert Xpress SARS-CoV-2/FLU/RSV assay is intended as an aid in  the diagnosis of influenza from Nasopharyngeal swab specimens and  should not be used as a sole basis for treatment. Nasal washings and  aspirates are unacceptable for Xpert Xpress SARS-CoV-2/FLU/RSV  testing. Fact Sheet for Patients: https://www.moore.com/ Fact Sheet for Healthcare Providers: https://www.young.biz/ This test is not yet approved or cleared by the [place] FDA and  has been authorized for detection and/or diagnosis of SARS-CoV-2 by  FDA under an Emergency Use Authorization (EUA). This EUA will remain  in effect (meaning this test can be used) for the duration of the  Covid-19 declaration under Section 564(b)(1) of the Act, 21  U.S.C. section 360bbb-3(b)(1), unless the authorization is  terminated or revoked. Performed at [facility], [address] [place], [place] [address]   Culture, blood (routine x 2)     Status: None (Preliminary result)   Collection Time: 12/21/19  8:36 PM   Specimen: BLOOD RIGHT HAND  Result Value Ref Range Status   Specimen Description BLOOD RIGHT HAND  Final   Special Requests   Final    BOTTLES DRAWN AEROBIC AND ANAEROBIC Blood Culture adequate volume PT ON ROCEPHIN ZITHROMAX   Culture   Final    NO GROWTH 3 DAYS Performed at [facility], [address], [place], [place] [address]    Report Status PENDING  Incomplete  MRSA PCR Screening     Status: None   Collection Time: 12/22/19  6:53 PM   Specimen: Nasal Mucosa; Nasopharyngeal  Result Value Ref Range Status   MRSA by PCR NEGATIVE NEGATIVE Final    Comment:        The GeneXpert MRSA Assay (FDA approved for  NASAL specimens only), is one component of a comprehensive MRSA colonization surveillance program. It is not intended to diagnose MRSA infection nor to guide or monitor treatment for MRSA infections. Performed at [facility], [address], [place], [place] [address]          Radiology Studies: DG Chest 2 View  Result Date: 12/23/2019 CLINICAL DATA:  Hypoxia, coronary vascular disease, sleep apnea, chronic bronchitis EXAM: CHEST - 2 VIEW COMPARISON:  12/21/2019 FINDINGS: Stable cardiomegaly and asymmetric chronic appearing interstitial opacities, worse in the right lower lung favored  to be chronic interstitial lung disease. No significant interval change in lung aeration. No enlarging effusion or pneumothorax. Left subclavian AICD noted. No new collapse or consolidation. IMPRESSION: Similar cardiomegaly and chronic interstitial lung disease pattern. Electronically Signed   By: Jerilynn Mages.  Shick M.D.   On: 12/23/2019 10:37        Scheduled Meds: . albuterol  2.5 mg Inhalation QID  . aspirin  81 mg Oral Daily  . carvedilol  6.25 mg Oral BID WC  . clopidogrel  75 mg Oral Daily  . enoxaparin (LOVENOX) injection  40 mg Subcutaneous Daily  . furosemide  40 mg Oral BID  . icosapent Ethyl  1 g Oral BID  . insulin aspart  0-15 Units Subcutaneous TID WC  . insulin aspart  0-5 Units Subcutaneous QHS  . mexiletine  150 mg Oral Q12H  . pantoprazole  40 mg Oral QPM  . pramipexole  0.5 mg Oral TID AC & HS  . sacubitril-valsartan  1 tablet Oral BID  . sodium chloride flush  3 mL Intravenous Once  . sodium chloride flush  3 mL Intravenous Q12H  . spironolactone  25 mg Oral Daily   Continuous Infusions: . azithromycin 500 mg (12/23/19 1647)  . ceFEPime (MAXIPIME) IV 2 g (12/24/19 0648)     LOS: 3 days    Time spent: 35 minutes.     Elmarie Shiley, MD [facility]   If 7PM-7AM, please contact night-coverage [email or web address]  12/24/2019, 8:20 AM

## 2019-12-25 LAB — CBC
HCT: 42.1 % (ref 39.0–52.0)
Hemoglobin: 14.1 g/dL (ref 13.0–17.0)
MCH: 31.9 pg (ref 26.0–34.0)
MCHC: 33.5 g/dL (ref 30.0–36.0)
MCV: 95.2 fL (ref 80.0–100.0)
Platelets: 184 10*3/uL (ref 150–400)
RBC: 4.42 MIL/uL (ref 4.22–5.81)
RDW: 13.2 % (ref 11.5–15.5)
WBC: 10.4 10*3/uL (ref 4.0–10.5)
nRBC: 0 % (ref 0.0–0.2)

## 2019-12-25 LAB — BASIC METABOLIC PANEL
Anion gap: 10 (ref 5–15)
BUN: 15 mg/dL (ref 8–23)
CO2: 20 mmol/L — ABNORMAL LOW (ref 22–32)
Calcium: 8.5 mg/dL — ABNORMAL LOW (ref 8.9–10.3)
Chloride: 107 mmol/L (ref 98–111)
Creatinine, Ser: 0.91 mg/dL (ref 0.61–1.24)
GFR calc Af Amer: >60 mL/min (ref >60)
GFR calc non Af Amer: >60 mL/min (ref >60)
Glucose, Bld: 159 mg/dL — ABNORMAL HIGH (ref 70–99)
Potassium: 3.7 mmol/L (ref 3.5–5.1)
Sodium: 137 mmol/L (ref 135–145)

## 2019-12-25 LAB — GLUCOSE, CAPILLARY
Glucose-Capillary: 187 mg/dL — ABNORMAL HIGH (ref 70–99)
Glucose-Capillary: 232 mg/dL — ABNORMAL HIGH (ref 70–99)
Glucose-Capillary: 169 mg/dL — ABNORMAL HIGH (ref 70–99)
Glucose-Capillary: 184 mg/dL — ABNORMAL HIGH (ref 70–99)

## 2019-12-25 LAB — CYCLIC CITRUL PEPTIDE ANTIBODY, IGG/IGA: CCP Antibodies IgG/IgA: 5 units (ref 0–19)

## 2019-12-25 NOTE — Progress Notes (Signed)
PROGRESS NOTE    [name]  HDQ:[id number] DOB: 05/19/1956 DOA: 12/21/2019 PCP: Hal Morales, NP   Brief Narrative: 64 year old with past medical history significant for ASCVD, hypertension, hyperlipidemia, diabetes type 2, ICP/V. tach status post ICD, OSA, pulmonary fibrosis and RLS, who presented with shortness of breath.  He also complained of fatigue and weakness.  He reports fever chills cough and shortness of breath.  Reports dry cough no productive sputum.  He completed his vaccination for Covid.  His primary care doctor prescribed him prednisone for which he took for 1 day. Chest x-ray showed airspace opacity right lower lung region, superimposed on fibrosis.  Suspect degree of pneumonia superimposed on fibrosis.  Patient has been admitted for community-acquired pneumonia.  Assessment & Plan:   Active Problems:   Community acquired pneumonia   Acute respiratory failure with hypoxia (HCC)    1-Acute hypoxic respiratory failure: Secondary to pneumonia, underlying pulmonary fibrosis: Patient presented with worsening shortness of breath, cough chills, leukocytosis.  Influenza panel and Covid negative. -Continue with IV azithromycin. Change ceftriaxone to Cefepime 12/22/2019 -Continue with oral Lasix. Monitor on oral lasix -Continue with nebulizer, albuterol.  -CCM consulted. Needs to establish care with Pulmonologist. Worsening hypoxemia yesterday, antibiotics change. Monitor closely.  -WBC trending down.  Improving. Still requiring 4 L oxygen at rest, 6 L on exertion  Waiting sputum culture results.   2-Sepsis Secondary  to Community-acquired pneumonia: Presented with chills, leukocytosis, low-grade temperature Continue with  azithromycin.  Spike  Fever 5/06, change ceftriaxone to cefepime. Day 3 cefepime [place] normal, lactic acid normal.   3-CAD status post PCI, ICM/V. tach status post ICD, chronic systolic and diastolic heart failure: Continue aspirin, clopidogrel, ,  carvedilol Continue with Lasix. HF managing medications.  Amiodarone change to Mexitil. Check mg level.   4-AKI; monitor on lasix. Cr stable.   5-HLD: On alirocumab and icosapent ethyl.  6-Hepatic steatosis; needs to loose weight.  Diabetes type 2: Continue with a sliding scale insulin RLS: Continue with pramipexole Obesity BMI 31:   Estimated body mass index is 30.94 kg/m as calculated from the following:   Height as of this encounter: 5\' 10"  (1.778 m).   Weight as of this encounter: 97.8 kg.   DVT prophylaxis: Lovenox Code Status: Full code Family Communication: Care discussed with patient.  Disposition Plan:  Patient is from: Home  Anticipated d/c date: 5/10 Anticipated discharge to ; Home Barriers to d/c or necessity for inpatient status: awaiting sputum culture. Home 5/10 if clear by Pulmonology   Consultants:   Pulmonary  Cardiology  Procedures:   none  Antimicrobials:  Ceftriaxone and azithromycin.   Subjective: Dyspnea improved. Requiting oxygen at rest and on exertion.   Objective: Vitals:   12/24/19 2119 12/24/19 2153 12/24/19 2300 12/25/19 0723  BP: 107/62   (!) 117/94  Pulse: 62 62 64 67  Resp:  18 18 18   Temp:    (!) 97.5 F (36.4 C)  TempSrc:    Oral  SpO2: 92% 92% 92% 96%  Weight:      Height:        Intake/Output Summary (Last 24 hours) at 12/25/2019 1252 Last data filed at 12/25/2019 0300 Gross per 24 hour  Intake 575.05 ml  Output --  Net 575.05 ml   Filed Weights   12/22/19 0606 12/23/19 0514 12/24/19 0228  Weight: 99.2 kg 96.9 kg 97.8 kg    Examination:  General exam: NAD Respiratory system: Normal resp effort, bilateral ronchus Cardiovascular system: S  1, S 2  RRR Gastrointestinal system; BS present, soft. nt Central nervous system: Non focal.  Extremities: no edema  Data Reviewed: I have personally reviewed following labs and imaging studies  CBC: Recent Labs  Lab 12/21/19 1304 12/23/19 0321 12/25/19 0804  WBC  13.9* 10.7* 10.4  HGB 15.4 14.0 14.1  HCT 45.9 42.0 42.1  MCV 96.0 95.7 95.2  PLT 124* 136* 184   Basic Metabolic Panel: Recent Labs  Lab 12/21/19 1304 12/22/19 0045 12/23/19 0321 12/23/19 0952 12/24/19 0357 12/25/19 0333  NA 132* 132* 132*  --  134* 137  K 4.2 3.7 3.7  --  4.1 3.7  CL 98 101 98  --  102 107  CO2 19* 20* 18*  --  19* 20*  GLUCOSE 238* 207* 103*  --  144* 159*  BUN 21 19 19   --  17 15  CREATININE 1.33* 1.35* 1.33*  --  0.97 0.91  CALCIUM 8.9 8.1* 8.3*  --  8.1* 8.5*  MG  --   --   --  2.0  --   --    GFR: Estimated Creatinine Clearance: 96.2 mL/min (by C-G formula based on SCr of 0.91 mg/dL). Liver Function Tests: No results for input(s): AST, ALT, ALKPHOS, BILITOT, PROT, ALBUMIN in the last 168 hours. No results for input(s): LIPASE, AMYLASE in the last 168 hours. No results for input(s): AMMONIA in the last 168 hours. Coagulation Profile: No results for input(s): INR, PROTIME in the last 168 hours. Cardiac Enzymes: Recent Labs  Lab 12/23/19 0321  CKTOTAL 131   BNP (last 3 results) Recent Labs    03/31/19 1557  PROBNP 55   HbA1C: No results for input(s): HGBA1C in the last 72 hours. CBG: Recent Labs  Lab 12/24/19 1229 12/24/19 1637 12/24/19 2121 12/25/19 0721 12/25/19 1135  GLUCAP 154* 140* 284* 187* 232*   Lipid Profile: No results for input(s): CHOL, HDL, LDLCALC, TRIG, CHOLHDL, LDLDIRECT in the last 72 hours. Thyroid Function Tests: No results for input(s): TSH, T4TOTAL, FREET4, T3FREE, THYROIDAB in the last 72 hours. Anemia Panel: No results for input(s): VITAMINB12, FOLATE, FERRITIN, TIBC, IRON, RETICCTPCT in the last 72 hours. Sepsis Labs: Recent Labs  Lab 12/21/19 1722 12/21/19 2036 12/22/19 0045 12/23/19 0321 12/23/19 0952 12/23/19 1229  PROCALCITON  --   --  0.29 0.69  --   --   LATICACIDVEN 2.0* 1.2  --   --  1.3 1.3    Recent Results (from the past 240 hour(s))  Culture, blood (routine x 2)     Status: None  (Preliminary result)   Collection Time: 12/21/19  5:19 PM   Specimen: BLOOD  Result Value Ref Range Status   Specimen Description BLOOD RIGHT ANTECUBITAL  Final   Special Requests   Final    BOTTLES DRAWN AEROBIC AND ANAEROBIC Blood Culture results may not be optimal due to an excessive volume of blood received in culture bottles   Culture   Final    NO GROWTH 4 DAYS Performed at [facility], [address], [place], [place]    Report Status PENDING  Incomplete  Respiratory Panel by RT PCR (Flu A&B, Covid) - Nasopharyngeal Swab     Status: None   Collection Time: 12/21/19  6:19 PM   Specimen: Nasopharyngeal Swab  Result Value Ref Range Status   SARS Coronavirus 2 by RT PCR NEGATIVE NEGATIVE Final    Comment: (NOTE) SARS-CoV-2 target nucleic acids are NOT DETECTED. The SARS-CoV-2  RNA is generally detectable in upper respiratoy specimens during the acute phase of infection. The lowest concentration of SARS-CoV-2 viral copies this assay can detect is 131 copies/mL. A negative result does not preclude SARS-Cov-2 infection and should not be used as the sole basis for treatment or other patient management decisions. A negative result may occur with  improper specimen collection/handling, submission of specimen other than nasopharyngeal swab, presence of viral mutation(s) within the areas targeted by this assay, and inadequate number of viral copies (<131 copies/mL). A negative result must be combined with clinical observations, patient history, and epidemiological information. The expected result is Negative. Fact Sheet for Patients:  https://www.moore.com/ Fact Sheet for Healthcare Providers:  https://www.young.biz/ This test is not yet ap proved or cleared by the [place] FDA and  has been authorized for detection and/or diagnosis of SARS-CoV-2 by FDA under an Emergency Use Authorization (EUA). This EUA will remain  in  effect (meaning this test can be used) for the duration of the COVID-19 declaration under Section 564(b)(1) of the Act, 21 U.S.C. section 360bbb-3(b)(1), unless the authorization is terminated or revoked sooner.    Influenza A by PCR NEGATIVE NEGATIVE Final   Influenza B by PCR NEGATIVE NEGATIVE Final    Comment: (NOTE) The Xpert Xpress SARS-CoV-2/FLU/RSV assay is intended as an aid in  the diagnosis of influenza from Nasopharyngeal swab specimens and  should not be used as a sole basis for treatment. Nasal washings and  aspirates are unacceptable for Xpert Xpress SARS-CoV-2/FLU/RSV  testing. Fact Sheet for Patients: https://www.moore.com/ Fact Sheet for Healthcare Providers: https://www.young.biz/ This test is not yet approved or cleared by the [place] FDA and  has been authorized for detection and/or diagnosis of SARS-CoV-2 by  FDA under an Emergency Use Authorization (EUA). This EUA will remain  in effect (meaning this test can be used) for the duration of the  Covid-19 declaration under Section 564(b)(1) of the Act, 21  U.S.C. section 360bbb-3(b)(1), unless the authorization is  terminated or revoked. Performed at [facility], [address], [place], [place] [address]   Culture, blood (routine x 2)     Status: None (Preliminary result)   Collection Time: 12/21/19  8:36 PM   Specimen: BLOOD RIGHT HAND  Result Value Ref Range Status   Specimen Description BLOOD RIGHT HAND  Final   Special Requests   Final    BOTTLES DRAWN AEROBIC AND ANAEROBIC Blood Culture adequate volume PT ON ROCEPHIN ZITHROMAX   Culture   Final    NO GROWTH 4 DAYS Performed at [facility], [address], [place], [place] [address]    Report Status PENDING  Incomplete  MRSA PCR Screening     Status: None   Collection Time: 12/22/19  6:53 PM   Specimen: Nasal Mucosa; Nasopharyngeal  Result Value Ref Range Status   MRSA by PCR NEGATIVE NEGATIVE  Final    Comment:        The GeneXpert MRSA Assay (FDA approved for NASAL specimens only), is one component of a comprehensive MRSA colonization surveillance program. It is not intended to diagnose MRSA infection nor to guide or monitor treatment for MRSA infections. Performed at [facility], [address] [place], [place] [address]   Expectorated sputum assessment w rflx to resp cult     Status: None   Collection Time: 12/24/19  2:08 PM   Specimen: Expectorated Sputum  Result Value Ref Range Status   Specimen Description EXPECTORATED SPUTUM  Final   Special Requests Normal  Final   Sputum evaluation   Final    THIS SPECIMEN IS ACCEPTABLE FOR SPUTUM CULTURE Performed at [facility], [place] [address] [place], [place] [address]    Report Status 12/24/2019 FINAL  Final  Culture, respiratory     Status: None (Preliminary result)   Collection Time: 12/24/19  2:08 PM  Result Value Ref Range Status   Specimen Description EXPECTORATED SPUTUM  Final   Special Requests Normal Reflexed from [id number]  Final   Gram Stain   Final    RARE WBC PRESENT, PREDOMINANTLY PMN RARE GRAM POSITIVE COCCI    Culture   Final    CULTURE REINCUBATED FOR BETTER GROWTH Performed at [facility], [place] [address] [place],  [address]    Report Status PENDING  Incomplete         Radiology Studies: No results found.      Scheduled Meds: . aspirin  81 mg Oral Daily  . carvedilol  6.25 mg Oral BID WC  . clopidogrel  75 mg Oral Daily  . enoxaparin (LOVENOX) injection  40 mg Subcutaneous Daily  . furosemide  40 mg Oral BID  . icosapent Ethyl  1 g Oral BID  . insulin aspart  0-15 Units Subcutaneous TID WC  . insulin aspart  0-5 Units Subcutaneous QHS  . mexiletine  150 mg Oral Q12H  . pantoprazole  40 mg Oral QPM  . pramipexole  0.5 mg Oral TID AC & HS  . sacubitril-valsartan  1 tablet Oral BID  . sodium chloride flush  3 mL Intravenous Once  . sodium chloride flush   3 mL Intravenous Q12H  . spironolactone  25 mg Oral Daily   Continuous Infusions: . azithromycin 500 mg (12/24/19 1736)  . ceFEPime (MAXIPIME) IV 2 g (12/25/19 0618)     LOS: 4 days    Time spent: 35 minutes.     Elmarie Shiley, MD [facility]   If 7PM-7AM, please contact night-coverage [email or web address]  12/25/2019, 12:52 PM

## 2019-12-25 NOTE — Progress Notes (Signed)
Pharmacy Antibiotic Note  [name] is a 64 y.o. male admitted on 12/21/2019 with pneumonia.  Pharmacy has been consulted for Cefepime dosing.  Antibiotics were broadened on 5/6 due to worsening SOB -SCr= 0.92 -respiratory cultures with GPC  Plan: Continue Cefepime 2 gms IV q8hr Monitor renal function, clinical status and C&S   Height: 5\' 10"  (177.8 cm) Weight: 97.8 kg (215 lb 9.8 oz) IBW/kg (Calculated) : 73  Temp (24hrs), Avg:98 F (36.7 C), Min:97.5 F (36.4 C), Max:99 F (37.2 C)  Recent Labs  Lab 12/21/19 1304 12/21/19 1722 12/21/19 2036 12/22/19 0045 12/23/19 0321 12/23/19 0952 12/23/19 1229 12/24/19 0357 12/25/19 0333  WBC 13.9*  --   --   --  10.7*  --   --   --   --   CREATININE 1.33*  --   --  1.35* 1.33*  --   --  0.97 0.91  LATICACIDVEN  --  2.0* 1.2  --   --  1.3 1.3  --   --     Estimated Creatinine Clearance: 96.2 mL/min (by C-G formula based on SCr of 0.91 mg/dL).    Allergies  Allergen Reactions  . Brilinta [Ticagrelor] Other (See Comments)    Intolerable dyspnea-changed to Plavix  . Codeine Other (See Comments)    Lightheaded, vision issues  . Lipitor [Atorvastatin Calcium] Other (See Comments)    Muscle pain  . Niaspan [Niacin Er] Other (See Comments)    Head hurts    Antimicrobials this admission: Azithro 5/6 >>  Cefepime 5/6 >>  Thank you for allowing pharmacy to be a part of this patient's care.  02/24/20, PharmD Clinical Pharmacist **Pharmacist phone directory can now be found on [email or web address] ([email or web address]).  Listed under [facility].

## 2019-12-25 NOTE — Therapy (Signed)
SATURATION QUALIFICATIONS: (This note is used to comply with regulatory documentation for home oxygen)  Patient Saturations on Room Air at Rest = 87%  Patient Saturations on Room Air while Ambulating = 84%  Patient Saturations on 6 Liters of oxygen while Ambulating = 90%  Please briefly explain why patient needs home oxygen: Pt's shortness of breath with ambulation and ADL tasks inhibit pt to be able to properly care for self without fatiguing so easily. Pt requires home O2 in order to perform necessary mobility and ADL tasks.  Flora Lipps, OTR/L Acute Rehabilitation Services Pager: [phone] Office: [phone]

## 2019-12-25 NOTE — Progress Notes (Signed)
Patient refused CPAP at this time. Patient aware to call for Respiratory if he changes his decision.

## 2019-12-25 NOTE — Progress Notes (Signed)
Physical Therapy Treatment Patient Details Name: [name] MRN: [id number] DOB: Jun 28, 1956 Today's Date: 12/25/2019    History of Present Illness [name] is a 64 y.o. male with medical history significant of ASCVD, HTN, HLD, T2DM, ICMP/VT s/p ICD, OSA, Pulm Fibrosis and RLS who presents with SOB. Pt admitted with sepsis secondary to community acquired pneumonia and acute hypoxemic respiratory failure.     PT Comments    Patient progressing well towards PT goals. Improved ambulation distance today to 450' with supervision for safety on 4L/min 02 [place] and Sp02 remained >90%. Pt continues to require standing rest breaks due to mild SOB. Lengthy discussion re: energy conservation techniques, importance of short bouts of activity with longer rest breaks and walking program etc. Working on weaning down 02 at rest, now at 3L/min 02 [place]. Encouraged walking with nursing 2 more times today and to continue to wean. Will follow.    Follow Up Recommendations  No PT follow up;Supervision for mobility/OOB     Equipment Recommendations  None recommended by PT    Recommendations for Other Services       Precautions / Restrictions Precautions Precautions: Other (comment) Precaution Comments: watch 02 Restrictions Weight Bearing Restrictions: No    Mobility  Bed Mobility               General bed mobility comments: Up in chair upon PT arrival.  Transfers Overall transfer level: Needs assistance Equipment used: None Transfers: Sit to/from Stand Sit to Stand: Supervision         General transfer comment: supervision for safety, stood from chair x1.  Ambulation/Gait Ambulation/Gait assistance: Supervision Gait Distance (Feet): 450 Feet Assistive device: None Gait Pattern/deviations: Step-through pattern;Decreased stride length;Drifts right/left Gait velocity: slightly decr   General Gait Details: Steady gait with 2 standing rest breaks. Sp02 remained >90% on 4l/min 02  [place]. 2/4 DOE.   Stairs             Wheelchair Mobility    Modified Rankin (Stroke Patients Only)       Balance Overall balance assessment: Mild deficits observed, not formally tested                                          Cognition Arousal/Alertness: Awake/alert Behavior During Therapy: WFL for tasks assessed/performed Overall Cognitive Status: Within Functional Limits for tasks assessed                                        Exercises      General Comments General comments (skin integrity, edema, etc.): SP02 decreased to 3L at rest and remained >92%, requires 4L 02 for activity.      Pertinent Vitals/Pain Pain Assessment: No/denies pain    Home Living                      Prior Function            PT Goals (current goals can now be found in the care plan section) Progress towards PT goals: Progressing toward goals    Frequency    Min 3X/week      PT Plan Current plan remains appropriate    Co-evaluation              AM-PAC PT "  6 Clicks" Mobility   Outcome Measure  Help needed turning from your back to your side while in a flat bed without using bedrails?: None Help needed moving from lying on your back to sitting on the side of a flat bed without using bedrails?: None Help needed moving to and from a bed to a chair (including a wheelchair)?: None Help needed standing up from a chair using your arms (e.g., wheelchair or bedside chair)?: None Help needed to walk in hospital room?: A Little Help needed climbing 3-5 steps with a railing? : A Little 6 Click Score: 22    End of Session Equipment Utilized During Treatment: Oxygen Activity Tolerance: Patient tolerated treatment well Patient left: in chair;with call bell/phone within reach;with family/visitor present Nurse Communication: Mobility status PT Visit Diagnosis: Other abnormalities of gait and mobility (R26.89);Muscle weakness  (generalized) (M62.81);History of falling (Z91.81)     Time: 1319-1340 PT Time Calculation (min) (ACUTE ONLY): 21 min  Charges:  $Therapeutic Exercise: 8-22 mins                     Vale Haven, PT, DPT Acute Rehabilitation Services Pager [phone] Office [phone]       [name] 12/25/2019, 1:57 PM

## 2019-12-25 NOTE — Evaluation (Signed)
Occupational Therapy Re-Evaluation Patient Details Name: [name] MRN: [id number] DOB: [birth year] Today's Date: 12/25/2019    History of Present Illness JVON MERONEY is a 64 y.o. male with medical history significant of ASCVD, HTN, HLD, T2DM, ICMP/VT s/p ICD, OSA, Pulm Fibrosis and RLS who presents with SOB. Pt admitted with sepsis secondary to community acquired pneumonia and acute hypoxemic respiratory failure.    Clinical Impression   Pt PTA: Ptliving with spouse at home and independent. Pt currently limited only by O2 requirements and SOB. Pt tolerating all ADL tasks with no physical assist and supervisionA to modified independence. Pt able to simulate step-in shower transfer and ADL at sink. 6 min walk test revealed O2 sats ~84% on RA and requiring 6L O2 to stay >90% O2 on 4L O2, pt hovering at 89% O2 with pursed lip breathing. Pt education on energy conservation techniques. Pt does not require continued OT skilled services. OT signing off.       Follow Up Recommendations  No OT follow up    Equipment Recommendations  None recommended by OT    Recommendations for Other Services       Precautions / Restrictions Precautions Precautions: Other (comment) Precaution Comments: watch 02 Restrictions Weight Bearing Restrictions: No      Mobility Bed Mobility Overal bed mobility: Modified Independent             General bed mobility comments: Up in chair upon PT arrival.  Transfers Overall transfer level: Needs assistance Equipment used: None Transfers: Sit to/from Stand Sit to Stand: Supervision         General transfer comment: for safety due to O2 and IV    Balance Overall balance assessment: No apparent balance deficits (not formally assessed)                                         ADL either performed or assessed with clinical judgement   ADL Overall ADL's : At baseline;Independent                                      Functional mobility during ADLs: Supervision/safety General ADL Comments: Pt tolerating all ADL tasks with no physical assist. Pt able to simulate step-in shower transfer and ADL at sink. 6 min walk test revealed O2 sats ~84% on RA and requiring 6L O2 to stay >90% O2 on 4L O2, pt hovering at 89% O2 with pursed lip breathing.     Vision Baseline Vision/History: No visual deficits Patient Visual Report: No change from baseline Vision Assessment?: No apparent visual deficits     Perception     Praxis      Pertinent Vitals/Pain Pain Assessment: No/denies pain     Hand Dominance Right   Extremity/Trunk Assessment Upper Extremity Assessment Upper Extremity Assessment: Overall WFL for tasks assessed   Lower Extremity Assessment Lower Extremity Assessment: Defer to PT evaluation   Cervical / Trunk Assessment Cervical / Trunk Assessment: Normal   Communication Communication Communication: No difficulties   Cognition Arousal/Alertness: Awake/alert Behavior During Therapy: WFL for tasks assessed/performed Overall Cognitive Status: Within Functional Limits for tasks assessed  General Comments  education provided on energy conservation techniques.    Exercises Exercises: Other exercises Other Exercises Other Exercises: Educated pt on pursed lip breathing strategies when SOB presents   Shoulder Instructions      Home Living Family/patient expects to be discharged to:: Private residence Living Arrangements: Spouse/significant other Available Help at Discharge: Family Type of Home: House Home Access: Stairs to enter Technical brewer of Steps: 3   Home Layout: One level     Bathroom Shower/Tub: Occupational psychologist: Standard     Home Equipment: None          Prior Functioning/Environment Level of Independence: Independent        Comments: Pt independent in all ADL, IADL, and mobility  tasks. Pt does not ambulate with an assistive device and reports 0 falls in the last 6 months. Pt still drives and is retired. Pt enjoys fishing with his grandson.        OT Problem List: Cardiopulmonary status limiting activity      OT Treatment/Interventions:      OT Goals(Current goals can be found in the care plan section) Acute Rehab OT Goals Patient Stated Goal: go home, breathe better  OT Frequency:     Barriers to D/C:            Co-evaluation              AM-PAC OT "6 Clicks" Daily Activity     Outcome Measure Help from another person eating meals?: None Help from another person taking care of personal grooming?: None Help from another person toileting, which includes using toliet, bedpan, or urinal?: None Help from another person bathing (including washing, rinsing, drying)?: None Help from another person to put on and taking off regular upper body clothing?: None Help from another person to put on and taking off regular lower body clothing?: None 6 Click Score: 24   End of Session Equipment Utilized During Treatment: Oxygen Nurse Communication: Mobility status  Activity Tolerance: Patient tolerated treatment well Patient left: in chair;with call bell/phone within reach  OT Visit Diagnosis: Muscle weakness (generalized) (M62.81)                Time: 4536-4680 OT Time Calculation (min): 15 min Charges:  OT General Charges $OT Visit: 1 Visit OT Evaluation $OT Eval Moderate Complexity: 1 Mod  Jefferey Pica, OTR/L Acute Rehabilitation Services Pager: [phone] Office: [phone]  [place] 12/25/2019, 3:02 PM

## 2019-12-26 ENCOUNTER — Telehealth: Payer: Self-pay | Admitting: Pulmonary Disease

## 2019-12-26 DIAGNOSIS — J9601 Acute respiratory failure with hypoxia: Secondary | ICD-10-CM

## 2019-12-26 LAB — BASIC METABOLIC PANEL
Anion gap: 9 (ref 5–15)
BUN: 14 mg/dL (ref 8–23)
CO2: 22 mmol/L (ref 22–32)
Calcium: 8.6 mg/dL — ABNORMAL LOW (ref 8.9–10.3)
Chloride: 106 mmol/L (ref 98–111)
Creatinine, Ser: 0.88 mg/dL (ref 0.61–1.24)
GFR calc Af Amer: >60 mL/min (ref >60)
GFR calc non Af Amer: >60 mL/min (ref >60)
Glucose, Bld: 163 mg/dL — ABNORMAL HIGH (ref 70–99)
Potassium: 3.8 mmol/L (ref 3.5–5.1)
Sodium: 137 mmol/L (ref 135–145)

## 2019-12-26 LAB — ANTINUCLEAR ANTIBODIES, IFA: ANA Ab, IFA: NEGATIVE

## 2019-12-26 LAB — CULTURE, BLOOD (ROUTINE X 2)
Culture: NO GROWTH
Culture: NO GROWTH

## 2019-12-26 LAB — CULTURE, RESPIRATORY W GRAM STAIN
Culture: NORMAL
Special Requests: NORMAL

## 2019-12-26 LAB — GLUCOSE, CAPILLARY
Glucose-Capillary: 208 mg/dL — ABNORMAL HIGH (ref 70–99)
Glucose-Capillary: 145 mg/dL — ABNORMAL HIGH (ref 70–99)

## 2019-12-26 MED ORDER — ALBUTEROL SULFATE HFA 108 (90 BASE) MCG/ACT IN AERS: 1.0000 | INHALATION_SPRAY | Freq: Four times a day (QID) | RESPIRATORY_TRACT | 0 refills | Status: AC | PRN

## 2019-12-26 MED ORDER — MEXILETINE HCL 150 MG PO CAPS: 150.0000 mg | ORAL_CAPSULE | Freq: Two times a day (BID) | ORAL | 3 refills | Status: DC

## 2019-12-26 MED ORDER — CEFUROXIME AXETIL 500 MG PO TABS: 500.0000 mg | ORAL_TABLET | Freq: Two times a day (BID) | ORAL | 0 refills | Status: AC

## 2019-12-26 NOTE — Progress Notes (Signed)
    NAME:  [name], MRN:  [id number], DOB:  1956-04-01, LOS: 5 ADMISSION DATE:  12/21/2019, CONSULTATION DATE:  5/6 REFERRING MD:  Regalado (TRH), CHIEF COMPLAINT:  Dyspnea    Brief History   64yo male former smoker (quit 14 years ago, >52 pack year hx) with hx CAD, HTN, DM, OSA (noncompliant with CPAP), chronic systolic CHF and VT on amiodarone, ischemic cardiomyopathy recent high res CT chest suggestive of ILD (UIP) presented 5/5 with 4 day hx cough, SOB, chills.  He was admitted by [facility], CXR concerning for RLL PNA. Treated with rocephin/azithro and PCCM consulted for further assistance with ?ILD.   History of present illness   64yo male with hx CAD, HTN, DM, OSA, chronic systolic CHF and VT on amiodarone, ischemic cardiomyopathy recent high res CT chest suggestive of ILD (UIP) presented 5/5 with 4 day hx cough, SOB, chills.  He was admitted by [facility], CXR concerning for RLL PNA. Treated with rocephin/azithro and PCCM consulted for further assistance with ?ILD.   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    Past Medical History   has a past medical history of ASCVD (arteriosclerotic cardiovascular disease), Chronic bronchitis (HCC), HTN (hypertension), Hyperlipidemia, Ischemic cardiomyopathy, OSA (obstructive sleep apnea) (05/24/11), Restless leg syndrome (06/25/2011), and Type 2 diabetes mellitus (HCC).   Significant Hospital Events   Admitted 5/6  Consults:  Cardiology   Procedures:    Significant Diagnostic Tests:  HRCT chest 4/27 (pta) > Spectrum of findings compatible with basilar predominant fibrotic interstitial lung disease with mild honeycombing, with interval progression since 01/04/2019 chest CT. Findings are consistent with UIP per consensus guidelines:   Micro Data:  BC x3 5/5 > covid 5/5 > neg   Sputum 5/5 >  Antimicrobials:  Rocephin 5/5 > azithro 5/5 >  Interim history/subjective:  No events. Getting ready to go home once O2 set up.  Objective   Blood pressure 135/82, pulse 71, temperature (!) 97.4 F (36.3 C), resp. rate 19, height 5\' 10"  (1.778 m), weight 97.8 kg, SpO2 96 %.        Intake/Output Summary (Last 24 hours) at 12/26/2019 1428 Last data filed at 12/25/2019 2133 Gross per 24 hour  Intake 3 ml  Output -  Net 3 ml   Filed Weights   12/22/19 0606 12/23/19 0514 12/24/19 0228  Weight: 99.2 kg 96.9 kg 97.8 kg    Examination: GEN: well appearing no acute distress HEENT: MMM, trachea midline CV: RRR, ext warm PULM: Faint crackles, no wheezing GI: Soft, +BS EXT: Chronic venous stasis changes NEURO: Moves all 4 ext to command PSYCH: RASS 0, good insight SKIN: no rashes   Assessment & Plan:  Hypoxemia- related to either IPF or fibrotic NSIP.  Sputum cx and rheum workup unremarkable.  Question of CAP that is being treated with antibiotics. - To go home with home O2 - Finishing course of antibiotics per primary - Will arrange f/u with Dr. 02/23/20 in 2 weeks or so in ILD clinic  Isaiah Serge MD PCCM

## 2019-12-26 NOTE — Telephone Encounter (Signed)
Steve Greathouse, MD  P [facility] Triage Pool; Pinion, Farley Ly, CMA  Please make appointment with me or Dr. Marchelle Gearing at next available clinic date in 2-4 weeks after discharge for pulmonary fibrosis follow up  ---------------------------------- Pt still admitted.

## 2019-12-26 NOTE — Discharge Summary (Signed)
Physician Discharge Summary  GILMORE LIST LKG:[id number] DOB: [birth year] DOA: 12/21/2019  PCP: Hal Morales, NP  Admit date: 12/21/2019 Discharge date: 12/26/2019  Admitted From: Home  Disposition: Home   Recommendations for Outpatient Follow-up:  1. Follow up with PCP in 1-2 weeks 2. Please obtain BMP/CBC in one week Please follow up on the following pending results:sputum culture.  Needs to follow up with Dr Gordy Levan for further treatment of pulmonary fibrosis.   Home Health: Yes Equipment/Devices: Oxygen   Discharge Condition:Stable.  CODE STATUS: Full code Diet recommendation: Heart Healthy  Brief/Interim Summary: 64 year old with past medical history significant for ASCVD, hypertension, hyperlipidemia, diabetes type 2, ICP/V. tach status post ICD, OSA, pulmonary fibrosis and RLS, who presented with shortness of breath.  He also complained of fatigue and weakness.  He reports fever chills cough and shortness of breath.  Reports dry cough no productive sputum.  He completed his vaccination for Covid.  His primary care doctor prescribed him prednisone for which he took for 1 day. Chest x-ray showed airspace opacity right lower lung region, superimposed on fibrosis.  Suspect degree of pneumonia superimposed on fibrosis.  Patient has been admitted for community-acquired pneumonia.   1-Acute hypoxic respiratory failure: Secondary to pneumonia, underlying pulmonary fibrosis: Patient presented with worsening shortness of breath, cough chills, leukocytosis.  Influenza panel and Covid negative. -Continue with IV azithromycin. Change ceftriaxone to Cefepime 12/22/2019 -Continue with oral Lasix. Monitor on oral lasix -Continue with nebulizer, albuterol.  -CCM consulted. Needs to establish care with Pulmonologist.  -WBC trending down.  Improving. Still requiring 3 L oxygen at rest, 6 L on exertion  Sputum culture pending.  Stable. Plan to follow up with pulmonologist for further  therapy for pulmonary fibrosis.   2-Sepsis Secondary  to Community-acquired pneumonia: Presented with chills, leukocytosis, low-grade temperature Continue with  azithromycin.  Spike  Fever 5/06, change ceftriaxone to cefepime. Day 3 cefepime. Discharge on ceftin 2 days.   Gap normal, lactic acid normal.   3-CAD status post PCI, ICM/V. tach status post ICD, chronic systolic and diastolic heart failure: Continue aspirin, clopidogrel, , carvedilol Continue with Lasix. Resume home dose of entresto at discharge HF managing medications.  Amiodarone change to Mexitil.  4-AKI; monitor on lasix. Cr stable.   5-HLD: On alirocumab and icosapent ethyl.  6-Hepatic steatosis; needs to loose weight.  Diabetes type 2: Continue with a sliding scale insulin. Resume home medications.  RLS: Continue with pramipexole Obesity BMI 31:   Discharge Diagnoses:  Active Problems:   Community acquired pneumonia   Acute respiratory failure with hypoxia [facility])    Discharge Instructions  Discharge Instructions    Diet - low sodium heart healthy   Complete by: As directed    Increase activity slowly   Complete by: As directed      Allergies as of 12/26/2019      Reactions   Brilinta [ticagrelor] Other (See Comments)   Intolerable dyspnea-changed to Plavix   Codeine Other (See Comments)   Lightheaded, vision issues   Lipitor [atorvastatin Calcium] Other (See Comments)   Muscle pain   Niaspan [niacin Er] Other (See Comments)   Head hurts      Medication List    STOP taking these medications   amiodarone 200 MG tablet Commonly known as: PACERONE   Co Q 10 100 MG Caps   predniSONE 20 MG tablet Commonly known as: DELTASONE     TAKE these medications   acetaminophen 500 MG tablet Commonly known as: TYLENOL  Take 500 mg by mouth every 6 (six) hours as needed (for headaches.).   albuterol 108 (90 Base) MCG/ACT inhaler Commonly known as: VENTOLIN HFA Inhale 1 puff into the lungs every 6  (six) hours as needed for wheezing or shortness of breath.   aspirin 81 MG chewable tablet Chew 1 tablet (81 mg total) by mouth daily.   carvedilol 6.25 MG tablet Commonly known as: COREG Take 1 tablet (6.25 mg total) by mouth 2 (two) times daily with a meal.   cefUROXime 500 MG tablet Commonly known as: CEFTIN Take 1 tablet (500 mg total) by mouth 2 (two) times daily with a meal for 2 days.   CINNAMON PO Take 1,000 mg by mouth daily.   clopidogrel 75 MG tablet Commonly known as: PLAVIX TAKE 1 TABLET BY MOUTH EVERY DAY   Entresto 97-103 MG Generic drug: sacubitril-valsartan Take 1 tablet by mouth 2 (two) times daily.   furosemide 40 MG tablet Commonly known as: LASIX Take 1 tablet (40 mg total) by mouth 2 (two) times daily. Take 1 tablet daily   icosapent Ethyl 1 g capsule Commonly known as: VASCEPA Take 1 g by mouth 2 (two) times daily.   Jardiance 10 MG Tabs tablet Generic drug: empagliflozin Take 10 mg by mouth daily.   magnesium oxide 400 MG tablet Commonly known as: MAG-OX Take 400 mg by mouth 2 (two) times daily.   metFORMIN 1000 MG tablet Commonly known as: GLUCOPHAGE Take 1,000 mg by mouth 2 (two) times daily.   mexiletine 150 MG capsule Commonly known as: MEXITIL Take 1 capsule (150 mg total) by mouth every 12 (twelve) hours.   nitroGLYCERIN 0.4 MG SL tablet Commonly known as: NITROSTAT Place 1 tablet (0.4 mg total) under the tongue every 5 (five) minutes as needed for chest pain.   pantoprazole 40 MG tablet Commonly known as: PROTONIX Take 40 mg by mouth every evening.   Praluent 75 MG/ML Soaj Generic drug: Alirocumab Inject 75 mg into the skin every 14 (fourteen) days.   pramipexole 1 MG tablet Commonly known as: MIRAPEX Take 0.5 mg by mouth 4 (four) times daily.   spironolactone 25 MG tablet Commonly known as: ALDACTONE Take 1 tablet (25 mg total) by mouth daily.   Toujeo SoloStar 300 UNIT/ML Solostar Pen Generic drug: insulin glargine  (1 Unit Dial) Inject 20 Units into the skin at bedtime.            Durable Medical Equipment  (From admission, onward)         Start     Ordered   12/25/19 1253  For home use only DME oxygen  Once    Question Answer Comment  Length of Need 6 Months   Mode or (Route) Nasal cannula   Liters per Minute 6   Oxygen delivery system Gas      12/25/19 1252          Allergies  Allergen Reactions  . Brilinta [Ticagrelor] Other (See Comments)    Intolerable dyspnea-changed to Plavix  . Codeine Other (See Comments)    Lightheaded, vision issues  . Lipitor [Atorvastatin Calcium] Other (See Comments)    Muscle pain  . Niaspan [Niacin Er] Other (See Comments)    Head hurts    Consultations: Cardiology Pulmonary  Procedures/Studies: DG Chest 2 View  Result Date: 12/23/2019 CLINICAL DATA:  Hypoxia, coronary vascular disease, sleep apnea, chronic bronchitis EXAM: CHEST - 2 VIEW COMPARISON:  12/21/2019 FINDINGS: Stable cardiomegaly and asymmetric chronic appearing interstitial opacities,  worse in the right lower lung favored to be chronic interstitial lung disease. No significant interval change in lung aeration. No enlarging effusion or pneumothorax. Left subclavian AICD noted. No new collapse or consolidation. IMPRESSION: Similar cardiomegaly and chronic interstitial lung disease pattern. Electronically Signed   By: Judie Petit.  Shick M.D.   On: 12/23/2019 10:37   DG Chest 2 View  Result Date: 12/21/2019 CLINICAL DATA:  Shortness of breath.  Fever. EXAM: CHEST - 2 VIEW COMPARISON:  November 08, 2019 chest radiograph; chest CT December 13, 2019 FINDINGS: There is underlying fibrotic change. There is scarring in the upper lobe regions, better seen on CT. In comparison with prior studies, there is ill-defined airspace opacity superimposed on fibrotic type change. No similar changes of this nature elsewhere. Heart size and pulmonary vascularity are normal. Pacemaker lead is attached to the right  ventricle. No adenopathy. There is degenerative change in the lower thoracic and upper lumbar regions. IMPRESSION: Airspace opacity right lower lung region, superimposed on fibrosis. Suspect a degree of pneumonia superimposed on fibrosis. Fibrotic changes elsewhere appear stable. Scarring in the upper lobes is better seen on CT than by radiography. Cardiac silhouette within normal limits. Pacemaker lead attached to right ventricle. No adenopathy appreciable by radiography. Electronically Signed   By: Bretta Bang III M.D.   On: 12/21/2019 13:24   CT Chest High Resolution  Result Date: 12/14/2019 CLINICAL DATA:  Persistent dyspnea. Amiodarone use. Evaluate for interstitial lung disease. EXAM: CT CHEST WITHOUT CONTRAST TECHNIQUE: Multidetector CT imaging of the chest was performed following the standard protocol without intravenous contrast. High resolution imaging of the lungs, as well as inspiratory and expiratory imaging, was performed. COMPARISON:  01/04/2019 chest CT angiogram. 11/08/2019 chest radiograph. FINDINGS: Cardiovascular: Normal heart size. No significant pericardial effusion/thickening. Three-vessel coronary atherosclerosis. Single lead left subclavian ICD with lead tip in right ventricular apex. Atherosclerotic nonaneurysmal thoracic aorta. Normal caliber pulmonary arteries. Mediastinum/Nodes: No discrete thyroid nodules. Unremarkable esophagus. No pathologically enlarged axillary, mediastinal or hilar lymph nodes, noting limited sensitivity for the detection of hilar adenopathy on this noncontrast study. Lungs/Pleura: No pneumothorax. No pleural effusion. No acute consolidative airspace disease, lung masses or significant pulmonary nodules. Moderate centrilobular and paraseptal emphysema. There is moderate patchy confluent subpleural reticulation and ground-glass opacity throughout both lungs with associated mild traction bronchiectasis and mild architectural distortion. There is a basilar  predominance to these findings. There is mild honeycombing in the mid to lower lungs bilaterally, most prominent in dependent basilar right lower lobe. These findings have progressed since 01/04/2019 chest CT. No significant lobular air trapping or evidence of tracheobronchomalacia on the expiration sequence. Upper abdomen: Diffuse hepatic steatosis. Cholelithiasis. Musculoskeletal: No aggressive appearing focal osseous lesions. Mild thoracic spondylosis. IMPRESSION: 1. Spectrum of findings compatible with basilar predominant fibrotic interstitial lung disease with mild honeycombing, with interval progression since 01/04/2019 chest CT. Findings are consistent with UIP per consensus guidelines: Diagnosis of Idiopathic Pulmonary Fibrosis: An Official ATS/ERS/JRS/ALAT Clinical Practice Guideline. Am Rosezetta Schlatter Crit Care Med Vol 198, Iss 5, [phone], Apr 18 2017. 2. Three-vessel coronary atherosclerosis. 3. Diffuse hepatic steatosis. 4. Cholelithiasis. 5. Aortic Atherosclerosis (ICD10-I70.0) and Emphysema (ICD10-J43.9). Electronically Signed   By: Delbert Phenix M.D.   On: 12/14/2019 10:54    Subjective: Breathing better, feels well to go home  Discharge Exam: Vitals:   12/25/19 2338 12/26/19 0758  BP: 119/68 135/82  Pulse: 62 71  Resp: 18 19  Temp: 98 F (36.7 C) (!) 97.4 F (36.3 C)  SpO2: 93%  96%     General: Pt is alert, awake, not in acute distress Cardiovascular: RRR, S1/S2 +, no rubs, no gallops Respiratory: CTA bilaterally, no wheezing, no rhonchi Abdominal: Soft, NT, ND, bowel sounds + Extremities: no edema, no cyanosis    The results of significant diagnostics from this hospitalization (including imaging, microbiology, ancillary and laboratory) are listed below for reference.     Microbiology: Recent Results (from the past 240 hour(s))  Culture, blood (routine x 2)     Status: None   Collection Time: 12/21/19  5:19 PM   Specimen: BLOOD  Result Value Ref Range Status   Specimen  Description BLOOD RIGHT ANTECUBITAL  Final   Special Requests   Final    BOTTLES DRAWN AEROBIC AND ANAEROBIC Blood Culture results may not be optimal due to an excessive volume of blood received in culture bottles   Culture   Final    NO GROWTH 5 DAYS Performed at [facility], [place] [address], [place], [place] [address]    Report Status 12/26/2019 FINAL  Final  Respiratory Panel by RT PCR (Flu A&B, Covid) - Nasopharyngeal Swab     Status: None   Collection Time: 12/21/19  6:19 PM   Specimen: Nasopharyngeal Swab  Result Value Ref Range Status   SARS Coronavirus 2 by RT PCR NEGATIVE NEGATIVE Final    Comment: (NOTE) SARS-CoV-2 target nucleic acids are NOT DETECTED. The SARS-CoV-2 RNA is generally detectable in upper respiratoy specimens during the acute phase of infection. The lowest concentration of SARS-CoV-2 viral copies this assay can detect is 131 copies/mL. A negative result does not preclude SARS-Cov-2 infection and should not be used as the sole basis for treatment or other patient management decisions. A negative result may occur with  improper specimen collection/handling, submission of specimen other than nasopharyngeal swab, presence of viral mutation(s) within the areas targeted by this assay, and inadequate number of viral copies (<131 copies/mL). A negative result must be combined with clinical observations, patient history, and epidemiological information. The expected result is Negative. Fact Sheet for Patients:  PinkCheek.be Fact Sheet for Healthcare Providers:  GravelBags.it This test is not yet ap proved or cleared by the [place] FDA and  has been authorized for detection and/or diagnosis of SARS-CoV-2 by FDA under an Emergency Use Authorization (EUA). This EUA will remain  in effect (meaning this test can be used) for the duration of the COVID-19 declaration under Section 564(b)(1) of the Act,  21 U.S.C. section 360bbb-3(b)(1), unless the authorization is terminated or revoked sooner.    Influenza A by PCR NEGATIVE NEGATIVE Final   Influenza B by PCR NEGATIVE NEGATIVE Final    Comment: (NOTE) The Xpert Xpress SARS-CoV-2/FLU/RSV assay is intended as an aid in  the diagnosis of influenza from Nasopharyngeal swab specimens and  should not be used as a sole basis for treatment. Nasal washings and  aspirates are unacceptable for Xpert Xpress SARS-CoV-2/FLU/RSV  testing. Fact Sheet for Patients: PinkCheek.be Fact Sheet for Healthcare Providers: GravelBags.it This test is not yet approved or cleared by the [place] FDA and  has been authorized for detection and/or diagnosis of SARS-CoV-2 by  FDA under an Emergency Use Authorization (EUA). This EUA will remain  in effect (meaning this test can be used) for the duration of the  Covid-19 declaration under Section 564(b)(1) of the Act, 21  U.S.C. section 360bbb-3(b)(1), unless the authorization is  terminated or revoked. Performed at [facility], [place] [address],  [place], [place] [address]   Culture, blood (routine x 2)     Status: None   Collection Time: 12/21/19  8:36 PM   Specimen: BLOOD RIGHT HAND  Result Value Ref Range Status   Specimen Description BLOOD RIGHT HAND  Final   Special Requests   Final    BOTTLES DRAWN AEROBIC AND ANAEROBIC Blood Culture adequate volume PT ON ROCEPHIN ZITHROMAX   Culture   Final    NO GROWTH 5 DAYS Performed at [facility], [address] [place], [place] [address]    Report Status 12/26/2019 FINAL  Final  MRSA PCR Screening     Status: None   Collection Time: 12/22/19  6:53 PM   Specimen: Nasal Mucosa; Nasopharyngeal  Result Value Ref Range Status   MRSA by PCR NEGATIVE NEGATIVE Final    Comment:        The GeneXpert MRSA Assay (FDA approved for NASAL specimens only), is one component of a comprehensive MRSA  colonization surveillance program. It is not intended to diagnose MRSA infection nor to guide or monitor treatment for MRSA infections. Performed at [facility], [address], [place], [place] [address]   Expectorated sputum assessment w rflx to resp cult     Status: None   Collection Time: 12/24/19  2:08 PM   Specimen: Expectorated Sputum  Result Value Ref Range Status   Specimen Description EXPECTORATED SPUTUM  Final   Special Requests Normal  Final   Sputum evaluation   Final    THIS SPECIMEN IS ACCEPTABLE FOR SPUTUM CULTURE Performed at [facility], [address] [place], [place] [address]    Report Status 12/24/2019 FINAL  Final  Culture, respiratory     Status: None (Preliminary result)   Collection Time: 12/24/19  2:08 PM  Result Value Ref Range Status   Specimen Description EXPECTORATED SPUTUM  Final   Special Requests Normal Reflexed from [id number]  Final   Gram Stain   Final    RARE WBC PRESENT, PREDOMINANTLY PMN RARE GRAM POSITIVE COCCI    Culture   Final    CULTURE REINCUBATED FOR BETTER GROWTH Performed at [facility], [address] [place], [place] [address]    Report Status PENDING  Incomplete     Labs: BNP (last 3 results) Recent Labs    01/04/19 1433 10/17/19 1452  BNP 187.1* 46.9   Basic Metabolic Panel: Recent Labs  Lab 12/22/19 0045 12/23/19 0321 12/23/19 0952 12/24/19 0357 12/25/19 0333 12/26/19 0257  NA 132* 132*  --  134* 137 137  K 3.7 3.7  --  4.1 3.7 3.8  CL 101 98  --  102 107 106  CO2 20* 18*  --  19* 20* 22  GLUCOSE 207* 103*  --  144* 159* 163*  BUN 19 19  --  17 15 14   CREATININE 1.35* 1.33*  --  0.97 0.91 0.88  CALCIUM 8.1* 8.3*  --  8.1* 8.5* 8.6*  MG  --   --  2.0  --   --   --    Liver Function Tests: No results for input(s): AST, ALT, ALKPHOS, BILITOT, PROT, ALBUMIN in the last 168 hours. No results for input(s): LIPASE, AMYLASE in the last 168 hours. No results for input(s): AMMONIA in the  last 168 hours. CBC: Recent Labs  Lab 12/21/19 1304 12/23/19 0321 12/25/19 0804  WBC 13.9* 10.7* 10.4  HGB 15.4 14.0 14.1  HCT 45.9 42.0 42.1  MCV 96.0 95.7 95.2  PLT 124* 136* 184   Cardiac Enzymes: Recent Labs  Lab 12/23/19 0321  CKTOTAL 131   BNP: Invalid input(s): POCBNP CBG: Recent Labs  Lab 12/25/19 0721 12/25/19 1135 12/25/19 1556 12/25/19 2253 12/26/19 0801  GLUCAP 187* 232* 169* 184* 208*   D-Dimer No results for input(s): DDIMER in the last 72 hours. Hgb A1c No results for input(s): HGBA1C in the last 72 hours. Lipid Profile No results for input(s): CHOL, HDL, LDLCALC, TRIG, CHOLHDL, LDLDIRECT in the last 72 hours. Thyroid function studies No results for input(s): TSH, T4TOTAL, T3FREE, THYROIDAB in the last 72 hours.  Invalid input(s): FREET3 Anemia work up No results for input(s): VITAMINB12, FOLATE, FERRITIN, TIBC, IRON, RETICCTPCT in the last 72 hours. Urinalysis    Component Value Date/Time   COLORURINE STRAW (A) 12/24/2017 1224   APPEARANCEUR CLEAR 12/24/2017 1224   LABSPEC 1.011 12/24/2017 1224   PHURINE 5.0 12/24/2017 1224   GLUCOSEU >=500 (A) 12/24/2017 1224   HGBUR NEGATIVE 12/24/2017 1224   BILIRUBINUR NEGATIVE 12/24/2017 1224   KETONESUR NEGATIVE 12/24/2017 1224   PROTEINUR NEGATIVE 12/24/2017 1224   UROBILINOGEN 1.0 08/22/2007 1224   NITRITE NEGATIVE 12/24/2017 1224   LEUKOCYTESUR NEGATIVE 12/24/2017 1224   Sepsis Labs Invalid input(s): PROCALCITONIN,  WBC,  LACTICIDVEN Microbiology Recent Results (from the past 240 hour(s))  Culture, blood (routine x 2)     Status: None   Collection Time: 12/21/19  5:19 PM   Specimen: BLOOD  Result Value Ref Range Status   Specimen Description BLOOD RIGHT ANTECUBITAL  Final   Special Requests   Final    BOTTLES DRAWN AEROBIC AND ANAEROBIC Blood Culture results may not be optimal due to an excessive volume of blood received in culture bottles   Culture   Final    NO GROWTH 5 DAYS Performed  at [facility], [address] [place], [place] [address]    Report Status 12/26/2019 FINAL  Final  Respiratory Panel by RT PCR (Flu A&B, Covid) - Nasopharyngeal Swab     Status: None   Collection Time: 12/21/19  6:19 PM   Specimen: Nasopharyngeal Swab  Result Value Ref Range Status   SARS Coronavirus 2 by RT PCR NEGATIVE NEGATIVE Final    Comment: (NOTE) SARS-CoV-2 target nucleic acids are NOT DETECTED. The SARS-CoV-2 RNA is generally detectable in upper respiratoy specimens during the acute phase of infection. The lowest concentration of SARS-CoV-2 viral copies this assay can detect is 131 copies/mL. A negative result does not preclude SARS-Cov-2 infection and should not be used as the sole basis for treatment or other patient management decisions. A negative result may occur with  improper specimen collection/handling, submission of specimen other than nasopharyngeal swab, presence of viral mutation(s) within the areas targeted by this assay, and inadequate number of viral copies (<131 copies/mL). A negative result must be combined with clinical observations, patient history, and epidemiological information. The expected result is Negative. Fact Sheet for Patients:  https://www.moore.com/ Fact Sheet for Healthcare Providers:  https://www.young.biz/ This test is not yet ap proved or cleared by the [place] FDA and  has been authorized for detection and/or diagnosis of SARS-CoV-2 by FDA under an Emergency Use Authorization (EUA). This EUA will remain  in effect (meaning this test can be used) for the duration of the COVID-19 declaration under Section 564(b)(1) of the Act, 21 U.S.C. section 360bbb-3(b)(1), unless the authorization is terminated or revoked sooner.    Influenza A by PCR NEGATIVE NEGATIVE  Final   Influenza B by PCR NEGATIVE NEGATIVE Final    Comment: (NOTE) The Xpert Xpress SARS-CoV-2/FLU/RSV assay is intended as an  aid in  the diagnosis of influenza from Nasopharyngeal swab specimens and  should not be used as a sole basis for treatment. Nasal washings and  aspirates are unacceptable for Xpert Xpress SARS-CoV-2/FLU/RSV  testing. Fact Sheet for Patients: https://www.moore.com/ Fact Sheet for Healthcare Providers: https://www.young.biz/ This test is not yet approved or cleared by the [place] FDA and  has been authorized for detection and/or diagnosis of SARS-CoV-2 by  FDA under an Emergency Use Authorization (EUA). This EUA will remain  in effect (meaning this test can be used) for the duration of the  Covid-19 declaration under Section 564(b)(1) of the Act, 21  U.S.C. section 360bbb-3(b)(1), unless the authorization is  terminated or revoked. Performed at [facility], [address], [place], [place] [address]   Culture, blood (routine x 2)     Status: None   Collection Time: 12/21/19  8:36 PM   Specimen: BLOOD RIGHT HAND  Result Value Ref Range Status   Specimen Description BLOOD RIGHT HAND  Final   Special Requests   Final    BOTTLES DRAWN AEROBIC AND ANAEROBIC Blood Culture adequate volume PT ON ROCEPHIN ZITHROMAX   Culture   Final    NO GROWTH 5 DAYS Performed at [facility], [address] [place], [place] [address]    Report Status 12/26/2019 FINAL  Final  MRSA PCR Screening     Status: None   Collection Time: 12/22/19  6:53 PM   Specimen: Nasal Mucosa; Nasopharyngeal  Result Value Ref Range Status   MRSA by PCR NEGATIVE NEGATIVE Final    Comment:        The GeneXpert MRSA Assay (FDA approved for NASAL specimens only), is one component of a comprehensive MRSA colonization surveillance program. It is not intended to diagnose MRSA infection nor to guide or monitor treatment for MRSA infections. Performed at [facility], [address], [place], [place] [address]   Expectorated sputum assessment w rflx to resp  cult     Status: None   Collection Time: 12/24/19  2:08 PM   Specimen: Expectorated Sputum  Result Value Ref Range Status   Specimen Description EXPECTORATED SPUTUM  Final   Special Requests Normal  Final   Sputum evaluation   Final    THIS SPECIMEN IS ACCEPTABLE FOR SPUTUM CULTURE Performed at [facility], [address], [place], [place] [address]    Report Status 12/24/2019 FINAL  Final  Culture, respiratory     Status: None (Preliminary result)   Collection Time: 12/24/19  2:08 PM  Result Value Ref Range Status   Specimen Description EXPECTORATED SPUTUM  Final   Special Requests Normal Reflexed from [id number]  Final   Gram Stain   Final    RARE WBC PRESENT, PREDOMINANTLY PMN RARE GRAM POSITIVE COCCI    Culture   Final    CULTURE REINCUBATED FOR BETTER GROWTH Performed at [facility], [address], [place], [place] [address]    Report Status PENDING  Incomplete     Time coordinating discharge: 40 minutes  SIGNED:   Alba Cory, MD  [facility]

## 2019-12-26 NOTE — Progress Notes (Addendum)
Patient ID: [name], male   DOB: November 26, 1955, 64 y.o.   MRN: [id number]     Advanced Heart Failure Rounding Note  PCP-Cardiologist: Minus Breeding, MD   Subjective:    Feeling better. Wants to go home. Denies SOB.   Objective:   Weight Range: 97.8 kg Body mass index is 30.94 kg/m.   Vital Signs:   Temp:  [97.4 F (36.3 C)-98 F (36.7 C)] 97.4 F (36.3 C) (05/10 0758) Pulse Rate:  [62-76] 71 (05/10 0758) Resp:  [18-19] 19 (05/10 0758) BP: (119-135)/(58-82) 135/82 (05/10 0758) SpO2:  [93 %-96 %] 96 % (05/10 0758) Last BM Date: 12/24/19  Weight change: Filed Weights   12/22/19 0606 12/23/19 0514 12/24/19 0228  Weight: 99.2 kg 96.9 kg 97.8 kg    Intake/Output:   Intake/Output Summary (Last 24 hours) at 12/26/2019 1035 Last data filed at 12/25/2019 2133 Gross per 24 hour  Intake 3 ml  Output --  Net 3 ml      Physical Exam    General: Sitting in the chair.  No resp difficulty HEENT: normal Neck: supple. no JVD. Carotids 2+ bilat; no bruits. No lymphadenopathy or thryomegaly appreciated. Cor: PMI nondisplaced. Regular rate & rhythm. No rubs, gallops or murmurs. Lungs: clear on 2 liters.  Abdomen: soft, nontender, nondistended. No hepatosplenomegaly. No bruits or masses. Good bowel sounds. Extremities: no cyanosis, clubbing, rash, edema Neuro: alert & orientedx3, cranial nerves grossly intact. moves all 4 extremities w/o difficulty. Affect pleasant   Telemetry  NSr 60s   Labs    CBC Recent Labs    12/25/19 0804  WBC 10.4  HGB 14.1  HCT 42.1  MCV 95.2  PLT 323   Basic Metabolic Panel Recent Labs    12/25/19 0333 12/26/19 0257  NA 137 137  K 3.7 3.8  CL 107 106  CO2 20* 22  GLUCOSE 159* 163*  BUN 15 14  CREATININE 0.91 0.88  CALCIUM 8.5* 8.6*   Liver Function Tests No results for input(s): AST, ALT, ALKPHOS, BILITOT, PROT, ALBUMIN in the last 72 hours. No results for input(s): LIPASE, AMYLASE in the last 72 hours. Cardiac Enzymes No  results for input(s): CKTOTAL, CKMB, CKMBINDEX, TROPONINI in the last 72 hours.  BNP: BNP (last 3 results) Recent Labs    01/04/19 1433 10/17/19 1452  BNP 187.1* 46.9    ProBNP (last 3 results) Recent Labs    03/31/19 1557  PROBNP 55     D-Dimer No results for input(s): DDIMER in the last 72 hours. Hemoglobin A1C No results for input(s): HGBA1C in the last 72 hours. Fasting Lipid Panel No results for input(s): CHOL, HDL, LDLCALC, TRIG, CHOLHDL, LDLDIRECT in the last 72 hours. Thyroid Function Tests No results for input(s): TSH, T4TOTAL, T3FREE, THYROIDAB in the last 72 hours.  Invalid input(s): FREET3  Other results:   Imaging    No results found.   Medications:     Scheduled Medications:  aspirin  81 mg Oral Daily   carvedilol  6.25 mg Oral BID WC   clopidogrel  75 mg Oral Daily   enoxaparin (LOVENOX) injection  40 mg Subcutaneous Daily   furosemide  40 mg Oral BID   icosapent Ethyl  1 g Oral BID   insulin aspart  0-15 Units Subcutaneous TID WC   insulin aspart  0-5 Units Subcutaneous QHS   mexiletine  150 mg Oral Q12H   pantoprazole  40 mg Oral QPM   pramipexole  0.5 mg Oral TID AC &  HS   sacubitril-valsartan  1 tablet Oral BID   sodium chloride flush  3 mL Intravenous Once   sodium chloride flush  3 mL Intravenous Q12H   spironolactone  25 mg Oral Daily    Infusions:  azithromycin 500 mg (12/25/19 2335)   ceFEPime (MAXIPIME) IV 2 g (12/26/19 0535)    PRN Medications: acetaminophen, albuterol, nitroGLYCERIN, senna-docusate   Assessment/Plan   1. PNA: CXR with RLL PNA, fever at admission.  PCT 0.29 => 0.69.  No culture data yet.  This has occurred on a background of ILD.  - Continue cefepime/azithromycin primary service.  2. Interstitial lung disease: Possible UIP.  Present on high resolution CT chest done prior to admission.  Per Dr. Isaiah Serge, probably not amiodarone-related, but given significant lung parenchymal disease, we decided to stop  amiodarone.  - Keep off amiodarone.   - He will start anti-fibrotic med as outpatient.  3. Chronic systolic CHF: Echo in 5/20 with EF 20-25%, but echo in 2/21 looked better, with EF 35-40%.  CPX in 3/21 with moderate HF limitation. Has Medtronic ICD.   -   Suspect main issue here is pulmonary.  - Volume status stable. Continue home dose HF meds  4. H/o VT/frequent PVCs: As above, will stop amiodarone.  He has started instead on mexiletine 150 mg bid.   5. CAD: LHC 12/2018 showed chronically occluded RCA w/ patent LCx system stents. No interventional targets.   - No chest pain.   - continue ASA + Plavix + ? blocker - on PCSK9i (Praluent) at home - on Vascepa for hypertriglyceridemia   Home today. He has HF follow up. Discussed with Dr Sunnie Nielsen.  Length of Stay: 5  Amy Clegg, NP  12/26/2019, 10:35 AM  Advanced Heart Failure Team Pager [phone] (M-F; 7a - 4p)  Please contact [facility] for night-coverage after hours (4p -7a ) and weekends on [email or web address]  Patient seen with NP, agree with the above note.   Stable today from cardiac perspective, will continue home HF meds.   Has oxygen requirement in setting of ILD and suspected PNA.  Will be going home today on oxygen.    Will arrange CHF clinic followup.   Marca Ancona 12/26/2019 3:04 PM

## 2019-12-26 NOTE — TOC Transition Note (Addendum)
Transition of Care [facility]) - CM/SW Discharge Note   Patient Details  Name: [name] MRN: [id number] Date of Birth: 01/20/1956  Transition of Care [facility] CM/SW Contact:  Beckie Busing, RN Phone Number: [phone]  12/26/2019, 12:58 PM   Clinical Narrative:    Home oxygen has been set up.  Patient has been made aware. O2 to be delivered to the room per [employer]. CM has contacted [employer] who will deliver O2 to the room and set up home delivery for the concentrator. No further needs noted at this time. CM will sign off.  12/27/2019 0815 Wife called CM stating that patient received the wrong oxygen. CM called and spoke with the patient who states that he wants the smaller portable tanks and the concentrator is too big and loud. CM has consulted with the MD who states that the patient requires 6L of O2 with ambulation. MD is agreeable to change the O2 to 5L. CM is unable to enter an order because it has been more that 2 hours since d/c. MD advised CM to check with [employer] to determine if verbal order can be given. CM explained the situation above to the rep from [employer] and rep confirms that the patient will still have to use the large concentrator for 5L of oxygen and that he is still not able to get the smaller portable tanks. Rep is not comfortable with placing the patient on a smaller concentrator which will max out at 5L because the concentrator will alarm constantly for high flow. Rep states that he will advise the patient to continue on the concentrator that is safest for his flow rate and will switch out the concentrator for a more quiet one but with the same ordered flow capacity to maintain the patient at 6L. Patient has been advised to follow up with primary care for titration of oxygen.       Final next level of care: Home/Self Care Barriers to Discharge: No Barriers Identified   Patient Goals and CMS Choice Patient states their goals for this hospitalization and ongoing recovery are::  Wants to go home   Choice offered to / list presented to : NA  Discharge Placement                       Discharge Plan and Services                DME Arranged: Oxygen DME Agency: Other - Comment([employer]) Date DME Agency Contacted: 12/26/19 Time DME Agency Contacted: 1256 Representative spoke with at DME Agency: Rolm Baptise HH Arranged: NA HH Agency: NA        Social Determinants of Health (SDOH) Interventions     Readmission Risk Interventions No flowsheet data found.

## 2019-12-27 NOTE — Telephone Encounter (Signed)
Checked pt's chart and pt has now been discharged from the hospital.  Attempted to call pt but unable to reach. Left message for pt to return call. When pt returns call, please schedule him a hospital follow up with either Dr. Isaiah Serge or Dr. Marchelle Gearing in 2-4 weeks.

## 2019-12-28 ENCOUNTER — Telehealth (HOSPITAL_COMMUNITY): Payer: Self-pay | Admitting: *Deleted

## 2019-12-28 ENCOUNTER — Telehealth: Payer: Self-pay | Admitting: Internal Medicine

## 2019-12-28 NOTE — Telephone Encounter (Signed)
We will need a order there are no orders in this chart for oxygen concentrator

## 2019-12-28 NOTE — Telephone Encounter (Signed)
We can order for now but in future make oxygen changes through pulmonary.

## 2019-12-28 NOTE — Telephone Encounter (Signed)
Spoke with pt's wife and advised her that we would need the pt to come into the office first to be seen. SHe understood and nothing further is needed.

## 2019-12-28 NOTE — Telephone Encounter (Signed)
Patient has not been seen yet by our office. Patient wife is complaining that oxygen concentrator is too loud. Patient's wife requesting a new order for oxygen concentrator. [employer] is supplier, # is [phone]. [employer] said if they get new order, they'll bring new machine/concentrator.

## 2019-12-28 NOTE — Telephone Encounter (Signed)
pts wife called stating pts oxygen was set too high. Pt only requires 2L at rest and w/exertion but his discharge paperwork says 3L at rest and 6L w/exertion.  She said his machine is too loud because its the machine meant to run 6L-15L. She would like his O2 orders to be changed so he can have a smaller machine. She wants to know if that's something we should order or should pulmonary manage this. Pt has not established care with [facility] pulm but they have tried to contact pt to schedule hospital f/u.  Routed to Dr.McLean for advice

## 2019-12-28 NOTE — Telephone Encounter (Signed)
Called pt no answer. Will try again later.

## 2020-01-02 ENCOUNTER — Telehealth (HOSPITAL_COMMUNITY): Payer: Self-pay | Admitting: *Deleted

## 2020-01-02 MED ORDER — ENTRESTO 49-51 MG PO TABS: 1.0000 | ORAL_TABLET | Freq: Two times a day (BID) | ORAL | 3 refills | Status: DC

## 2020-01-02 NOTE — Telephone Encounter (Signed)
Pt aware medication  sent to pharmacy 

## 2020-01-02 NOTE — Telephone Encounter (Signed)
Appt schedule for 02/13/20

## 2020-01-02 NOTE — Telephone Encounter (Signed)
pts wife called stating pt has no energy since increasing entresto to 97/103 after he was discharged from the hospital. During his admission pt took entresto 49/51mg . pts bp is 93/63. pts wife wants to know if pt can decrease entresto to 49/51mg .   Routed to Dr.McLean for advice

## 2020-01-02 NOTE — Telephone Encounter (Signed)
Yes, decrease Entresto to 49/51 bid.

## 2020-01-03 LAB — MISC LABCORP TEST (SEND OUT): Labcorp test code: 520085

## 2020-01-09 ENCOUNTER — Other Ambulatory Visit: Payer: Self-pay

## 2020-01-09 ENCOUNTER — Encounter (HOSPITAL_COMMUNITY): Payer: Self-pay

## 2020-01-09 ENCOUNTER — Ambulatory Visit (HOSPITAL_COMMUNITY)
Admit: 2020-01-09 | Discharge: 2020-01-09 | Disposition: A | Discharge status: Home / Self Care | Payer: BC Managed Care – PPO | Source: Ambulatory Visit | Attending: Cardiology | Admitting: Cardiology

## 2020-01-09 VITALS — BP 114/76 | HR 68 | Wt 207.0 lb

## 2020-01-09 DIAGNOSIS — G4733 Obstructive sleep apnea (adult) (pediatric): Secondary | ICD-10-CM | POA: Insufficient documentation

## 2020-01-09 DIAGNOSIS — E785 Hyperlipidemia, unspecified: Secondary | ICD-10-CM | POA: Diagnosis not present

## 2020-01-09 DIAGNOSIS — I493 Ventricular premature depolarization: Secondary | ICD-10-CM | POA: Diagnosis not present

## 2020-01-09 DIAGNOSIS — Z8249 Family history of ischemic heart disease and other diseases of the circulatory system: Secondary | ICD-10-CM | POA: Diagnosis not present

## 2020-01-09 DIAGNOSIS — E781 Pure hyperglyceridemia: Secondary | ICD-10-CM | POA: Diagnosis not present

## 2020-01-09 DIAGNOSIS — E119 Type 2 diabetes mellitus without complications: Secondary | ICD-10-CM | POA: Diagnosis not present

## 2020-01-09 DIAGNOSIS — Z87891 Personal history of nicotine dependence: Secondary | ICD-10-CM | POA: Insufficient documentation

## 2020-01-09 DIAGNOSIS — Z7982 Long term (current) use of aspirin: Secondary | ICD-10-CM | POA: Diagnosis not present

## 2020-01-09 DIAGNOSIS — J984 Other disorders of lung: Secondary | ICD-10-CM | POA: Diagnosis not present

## 2020-01-09 DIAGNOSIS — Z95810 Presence of automatic (implantable) cardiac defibrillator: Secondary | ICD-10-CM | POA: Diagnosis not present

## 2020-01-09 DIAGNOSIS — Z955 Presence of coronary angioplasty implant and graft: Secondary | ICD-10-CM | POA: Diagnosis not present

## 2020-01-09 DIAGNOSIS — I5022 Chronic systolic (congestive) heart failure: Secondary | ICD-10-CM | POA: Diagnosis present

## 2020-01-09 DIAGNOSIS — I110 Hypertensive heart disease with heart failure: Secondary | ICD-10-CM | POA: Diagnosis not present

## 2020-01-09 DIAGNOSIS — Z7902 Long term (current) use of antithrombotics/antiplatelets: Secondary | ICD-10-CM | POA: Diagnosis not present

## 2020-01-09 DIAGNOSIS — I2510 Atherosclerotic heart disease of native coronary artery without angina pectoris: Secondary | ICD-10-CM | POA: Insufficient documentation

## 2020-01-09 DIAGNOSIS — J849 Interstitial pulmonary disease, unspecified: Secondary | ICD-10-CM | POA: Diagnosis not present

## 2020-01-09 DIAGNOSIS — Z794 Long term (current) use of insulin: Secondary | ICD-10-CM | POA: Insufficient documentation

## 2020-01-09 DIAGNOSIS — Z79899 Other long term (current) drug therapy: Secondary | ICD-10-CM | POA: Insufficient documentation

## 2020-01-09 DIAGNOSIS — I255 Ischemic cardiomyopathy: Secondary | ICD-10-CM | POA: Insufficient documentation

## 2020-01-09 LAB — BASIC METABOLIC PANEL
Anion gap: 12 (ref 5–15)
BUN: 13 mg/dL (ref 8–23)
CO2: 24 mmol/L (ref 22–32)
Calcium: 8.9 mg/dL (ref 8.9–10.3)
Chloride: 99 mmol/L (ref 98–111)
Creatinine, Ser: 1.07 mg/dL (ref 0.61–1.24)
GFR calc Af Amer: >60 mL/min (ref >60)
GFR calc non Af Amer: >60 mL/min (ref >60)
Glucose, Bld: 182 mg/dL — ABNORMAL HIGH (ref 70–99)
Potassium: 3.9 mmol/L (ref 3.5–5.1)
Sodium: 135 mmol/L (ref 135–145)

## 2020-01-09 LAB — SEDIMENTATION RATE: Sed Rate: 14 mm/hr (ref 0–16)

## 2020-01-09 NOTE — Progress Notes (Signed)
Advanced Heart Failure Clinic Progress Note  PCP: Charlynn Court, NP  Cardiology: Dr. Percival Spanish HF Cardiology: Dr. Aundra Dubin  64 y.o. with history of CAD, ischemic cardiomyopathy/chronic systolic CHF, OSA, and VT was referred by Dr. Percival Spanish for evaluation of CHF.  Patient has a long history of CAD (see PMH below).  Last cath in 5/20 showed a chronically occluded RCA with collaterals and stable stents in the LCx system, no intervention.  He has had an ischemic cardiomyopathy with EF 20-25% in 5/20, and actually up to 35-40% on 2/21 echo.  He has a Medtronic ICD and is on amiodarone for VT. He has significant OSA but has not been able to tolerate CPAP with a facemask.    CPX was done in 3/21, study was submaximal with moderate HF limitation but also functional limitation from pulmonary restriction and exertional hypoxemia.  V/Q scan was done and was not suggestive of chronic PE.  Zio patch in 3/21 showed 2% PVCs.   He was recently seen in [facility] on 4/6 and stable from cardiac standpoint, however given pulmonary restriction noted on PFTs w/ CPX, there was concern for ILD and given amiodarone use, he was ordered to get a high resolution CT of chest to look for evidence of amiodarone toxicity or other form for ILD. Scan was done 4/27 and showed spectrum of findings compatible with basilar predominant fibrotic interstitial lung disease with mild honeycombing, with interval progression since 01/04/2019 chest CT. Findings were consistent with UIP per consensus guidelines. He was referred to pulmonology but ended up being admitted before his initial consultation.   On 5/5, pt presented to the [facility] ED w/ complaints of cough and SOB, fever at home, body aches and chills. He has completed vaccination for COVID. In ED, he was tachypenic and hypoxic. RR 24. O2 stats 89% on RA. Had low grade fever, temp 99.2 COVID and influenza test negative. BP stable. Labs showed leukocytosis, WBC 13.9. Lactic acid 2.0. SCr 1.35. PCT  0.29.  CXR showed RLL opacity concerning for PNA. EKG showed NSR. He was admitted for sepsis PNA and started on rocephin/azithromycin. AHF team was consulted. He was seen by Dr. Aundra Dubin who disused pt case w/ pulmonology. He was suspected to have primarily PNA superimposed on baseline ILD, ?UIT. It was not felt that he had amiodarone toxicity, however decision was made to stop amiodarone and started mexiletine 150 mg bid to control his VT. His other HF/ cardiac meds were continued. Delene Loll was also increased to 97-103, however he did not tolerate high dose due to hypotension and fatigue. Dose was reduced back down to 49-51 mg bid.   He presents back to clinic for post hospital f/u. Here w/ his wife. Overall much improved and recovered from his PNA but continues w/ limitations w/ physical activities. Does ok w/ basic ADLs but SOB w/ moderate activities. No resting dyspnea. No supp O2 requirements currently. He has f/u w/ pulmonology 6/28.   He is tolerating mexiletine ok. No side effects. EKG shows NSR. QT/QTc normal. Device interrogation shows no VT/VF. No AF/AT. Volume status stable by exam and by optivol. BP stable. No further hypotension w/ current dose of Entresto. Fatigue resolved.    Labs (9/20): K 4.5, creatinine 1.08 Labs (3/21): hgb 16.9, K 4.5, creatinine 1.3, AST 34, ALT 54 (mildly elevated), LDL 51, TGs 373, TSH normal.  Labs (5/21): SCr 0.88. K 3.8.   PMH: 1. CAD:  - PCI to [facility] in 2012.  - DES to LCx in  5/19.  - LHC (5/20): 50% proximal LAD, 95% proximal D1, patent LCx and OM1 stents, total occlusion of RCA with collaterals.  2. Chronic systolic CHF: Ischemic cardiomyopathy. Medtronic ICD.  - RHC (5/20): mean RA 3, PA 25/5, mean PCWP 6, CI 1.86 - Echo (5/20): EF 20-25% - Echo (2/21): EF 35-40%, mild LV dilation, RV low normal function.  - CPX (3/21): peak VO2 17.6, VE/VCO2 slope 43, RER 0.96 => submaximal, moderate HF limitation but also noted is pulmonary restriction with exertional  hypoxemia.  3. VT: Now on amiodarone.  4. OSA: Has not tolerate CPAP.  5. HTN 6. Type 2 diabetes 7. Hyperlipidemia: Has not tolerated statins.  8. PVCs - Zio patch (3/21) with 2% PVCs  SH: Married, lives in [place], retired, quit smoking in 2007.   Family History  Problem Relation Age of Onset  . Lymphoma Mother   . Heart attack Father 82  . Heart attack Brother 51   ROS: All systems reviewed and negative except as per HPI.   Current Outpatient Medications  Medication Sig Dispense Refill  . acetaminophen (TYLENOL) 500 MG tablet Take 500 mg by mouth every 6 (six) hours as needed (for headaches.).    Marland Kitchen albuterol (VENTOLIN HFA) 108 (90 Base) MCG/ACT inhaler Inhale 1 puff into the lungs every 6 (six) hours as needed for wheezing or shortness of breath. 8 g 0  . Alirocumab (PRALUENT) 75 MG/ML SOAJ Inject 75 mg into the skin every 14 (fourteen) days. 2 pen 11  . aspirin 81 MG chewable tablet Chew 1 tablet (81 mg total) by mouth daily.    . carvedilol (COREG) 6.25 MG tablet Take 1 tablet (6.25 mg total) by mouth 2 (two) times daily with a meal. 180 tablet 3  . CINNAMON PO Take 1,000 mg by mouth daily.    . clopidogrel (PLAVIX) 75 MG tablet TAKE 1 TABLET BY MOUTH EVERY DAY 90 tablet 3  . Coenzyme Q10 (CO Q 10) 100 MG CAPS Take 1 tablet by mouth daily.    . furosemide (LASIX) 40 MG tablet Take 1 tablet (40 mg total) by mouth 2 (two) times daily. Take 1 tablet daily 180 tablet 3  . icosapent Ethyl (VASCEPA) 1 g capsule Take 1 g by mouth 2 (two) times daily.    Marland Kitchen JARDIANCE 10 MG TABS tablet Take 10 mg by mouth daily.    . magnesium oxide (MAG-OX) 400 MG tablet Take 400 mg by mouth 2 (two) times daily.    . metFORMIN (GLUCOPHAGE) 1000 MG tablet Take 1,000 mg by mouth 2 (two) times daily.    Marland Kitchen mexiletine (MEXITIL) 150 MG capsule Take 1 capsule (150 mg total) by mouth every 12 (twelve) hours. 60 capsule 3  . nitroGLYCERIN (NITROSTAT) 0.4 MG SL tablet Place 1 tablet (0.4 mg total) under the  tongue every 5 (five) minutes as needed for chest pain. 30 tablet 1  . pantoprazole (PROTONIX) 40 MG tablet Take 40 mg by mouth every evening.     . pramipexole (MIRAPEX) 1 MG tablet Take 0.5 mg by mouth 4 (four) times daily.     . sacubitril-valsartan (ENTRESTO) 49-51 MG Take 1 tablet by mouth 2 (two) times daily. 60 tablet 3  . spironolactone (ALDACTONE) 25 MG tablet Take 1 tablet (25 mg total) by mouth daily. 90 tablet 3  . TOUJEO SOLOSTAR 300 UNIT/ML SOPN Inject 30 Units into the skin at bedtime.      No current facility-administered medications for this encounter.   BP  114/76   Pulse 68   Wt 93.9 kg   SpO2 94%   BMI 29.70 kg/m   PHYSICAL EXAM: General:  Well appearing. No respiratory difficulty HEENT: normal Neck: supple. no JVD. Carotids 2+ bilat; no bruits. No lymphadenopathy or thyromegaly appreciated. Cor: PMI nondisplaced. Regular rate & rhythm. No rubs, gallops or murmurs. Lungs: bibasilar crackles c/w ILD  Abdomen: soft, nontender, nondistended. No hepatosplenomegaly. No bruits or masses. Good bowel sounds. Extremities: no cyanosis, clubbing, rash, edema Neuro: alert & oriented x 3, cranial nerves grossly intact. moves all 4 extremities w/o difficulty. Affect pleasant.   Assessment/Plan:  1. Interstitial lung disease: Possible UIP.  Present on high resolution CT of chest 4/21. Per Dr. Vaughan Browner, probably not amiodarone-related, but given significant lung parenchymal disease, we decided to stop amiodarone.   - He will hopefully start anti-fibrotic meds soon. Pulmonology to manage. Has f/u in 4 weeks.  - Check ESR 3. Chronic systolic CHF: Echo in 7/35 with EF 20-25%, but echo in 2/21 looked better, with EF 35-40%.CPX in 3/21 with moderate HF limitation. Has Medtronic ICD.   - NYHA Class III. Suspect main issue here is pulmonary. He is not fluid overloaded. Volume status ok by exam and by Optivol.  - Continue Entresto 49-51 bid (did not tolerate max dose due to  fatigue/hypotension) - Continue spironolactone 25 mg daily  - Continue Coreg 6.25 mg bid - Continue Jardiance 10 mg daily  - Check BMP today  4. H/o VT/frequent PVCs: As above,  Amiodarone was discontinued 5/21.  He has started instead on mexiletine 150 mg bid. QT/QTc stable on EKG today.  - device interrogation shows no VT/VF. 5. CAD: LHC 12/2018 showed chronically occluded RCA w/ patent LCx system stents. No interventional targets.   - stable w/o CP - continue ASA + Plavix +?blocker - on PCSK9i (Praluent) at home - on Vascepa for hypertriglyceridemia  6. Recent CAP - required inpatient treatment 5/21.  - has completed abx and has recovered    Keep f/u w/ Dr. Aundra Dubin on 02/24/20   Lyda Jester, PA-C 01/09/2020

## 2020-01-09 NOTE — Patient Instructions (Addendum)
Labs done today, we will contact you for abnormal results  Keep follow up appointment as scheduled on July 9th at 10 AM with Dr Shirlee Latch, Parking code [phone]  If you have any questions or concerns before your next appointment please send [place] a message through [facility] or call our office at [phone].  At the [facility] Clinic, you and your health needs are our priority. As part of our continuing mission to provide you with exceptional heart care, we have created designated Provider Care Teams. These Care Teams include your primary Cardiologist (physician) and Advanced Practice Providers (APPs- Physician Assistants and Nurse Practitioners) who all work together to provide you with the care you need, when you need it.   You may see any of the following providers on your designated Care Team at your next follow up: Marland Kitchen Dr Arvilla Meres . Dr Marca Ancona . Tonye Becket, NP . Robbie Lis, PA . Karle Plumber, PharmD   Please be sure to bring in all your medications bottles to every appointment.

## 2020-01-24 ENCOUNTER — Telehealth (HOSPITAL_COMMUNITY): Payer: Self-pay | Admitting: *Deleted

## 2020-01-24 ENCOUNTER — Ambulatory Visit (INDEPENDENT_AMBULATORY_CARE_PROVIDER_SITE_OTHER): Payer: BC Managed Care – PPO | Admitting: *Deleted

## 2020-01-24 DIAGNOSIS — I255 Ischemic cardiomyopathy: Secondary | ICD-10-CM | POA: Diagnosis not present

## 2020-01-24 NOTE — Telephone Encounter (Signed)
Very hard to know if mexiletine is the problem, often feel weak/tired for several weeks after pneumonia.  Needs followup visit to assess.

## 2020-01-24 NOTE — Telephone Encounter (Signed)
Pt left VM stating he was in the hospital for pneumonia on 5/5 and his amiodarone was changed to mexiletine. Pt has been tired, weak, short of breath, and no appetite. Pt said that was the only medication change. Pt wants to know if the medication is causing his symptoms.  Routed to Dr.McLean for advice

## 2020-01-24 NOTE — Telephone Encounter (Signed)
Pt aware. F/u with pulmonary on 6/28 and CHF on 7/9.

## 2020-01-25 ENCOUNTER — Telehealth: Payer: Self-pay

## 2020-01-25 LAB — CUP PACEART REMOTE DEVICE CHECK
Battery Remaining Longevity: 125 mo
Battery Voltage: 3.01 V
Brady Statistic RV Percent Paced: 0.05 %
Date Time Interrogation Session: 20210609144847
HighPow Impedance: 83 Ohm
Implantable Lead Implant Date: 20191202
Implantable Lead Location: 753860
Implantable Pulse Generator Implant Date: 20191202
Lead Channel Impedance Value: 285 Ohm
Lead Channel Impedance Value: 380 Ohm
Lead Channel Pacing Threshold Amplitude: 0.75 V
Lead Channel Pacing Threshold Pulse Width: 0.4 ms
Lead Channel Sensing Intrinsic Amplitude: 2.375 mV
Lead Channel Sensing Intrinsic Amplitude: 2.375 mV
Lead Channel Setting Pacing Amplitude: 2.5 V
Lead Channel Setting Pacing Pulse Width: 0.4 ms
Lead Channel Setting Sensing Sensitivity: 0.3 mV

## 2020-01-25 NOTE — Telephone Encounter (Signed)
Left message for patient to remind of missed remote transmission.  

## 2020-01-26 NOTE — Progress Notes (Signed)
Remote ICD transmission.   

## 2020-02-13 ENCOUNTER — Inpatient Hospital Stay: Payer: BC Managed Care – PPO | Admitting: Pulmonary Disease

## 2020-02-15 ENCOUNTER — Other Ambulatory Visit: Payer: Self-pay | Admitting: Cardiology

## 2020-02-24 ENCOUNTER — Other Ambulatory Visit: Payer: Self-pay

## 2020-02-24 ENCOUNTER — Encounter (HOSPITAL_COMMUNITY): Payer: Self-pay | Admitting: Cardiology

## 2020-02-24 ENCOUNTER — Ambulatory Visit (HOSPITAL_COMMUNITY)
Admission: RE | Admit: 2020-02-24 | Discharge: 2020-02-24 | Disposition: A | Discharge status: Home / Self Care | Payer: BC Managed Care – PPO | Source: Ambulatory Visit | Attending: Cardiology | Admitting: Cardiology

## 2020-02-24 VITALS — BP 106/74 | HR 70 | Wt 207.0 lb

## 2020-02-24 DIAGNOSIS — Z8249 Family history of ischemic heart disease and other diseases of the circulatory system: Secondary | ICD-10-CM | POA: Diagnosis not present

## 2020-02-24 DIAGNOSIS — Z87891 Personal history of nicotine dependence: Secondary | ICD-10-CM | POA: Insufficient documentation

## 2020-02-24 DIAGNOSIS — Z888 Allergy status to other drugs, medicaments and biological substances status: Secondary | ICD-10-CM | POA: Diagnosis not present

## 2020-02-24 DIAGNOSIS — I472 Ventricular tachycardia, unspecified: Secondary | ICD-10-CM

## 2020-02-24 DIAGNOSIS — Z7902 Long term (current) use of antithrombotics/antiplatelets: Secondary | ICD-10-CM | POA: Insufficient documentation

## 2020-02-24 DIAGNOSIS — I5022 Chronic systolic (congestive) heart failure: Secondary | ICD-10-CM | POA: Diagnosis not present

## 2020-02-24 DIAGNOSIS — G4733 Obstructive sleep apnea (adult) (pediatric): Secondary | ICD-10-CM | POA: Insufficient documentation

## 2020-02-24 DIAGNOSIS — Z794 Long term (current) use of insulin: Secondary | ICD-10-CM | POA: Diagnosis not present

## 2020-02-24 DIAGNOSIS — E785 Hyperlipidemia, unspecified: Secondary | ICD-10-CM | POA: Diagnosis not present

## 2020-02-24 DIAGNOSIS — Z955 Presence of coronary angioplasty implant and graft: Secondary | ICD-10-CM | POA: Insufficient documentation

## 2020-02-24 DIAGNOSIS — J849 Interstitial pulmonary disease, unspecified: Secondary | ICD-10-CM | POA: Insufficient documentation

## 2020-02-24 DIAGNOSIS — I5043 Acute on chronic combined systolic (congestive) and diastolic (congestive) heart failure: Secondary | ICD-10-CM | POA: Diagnosis not present

## 2020-02-24 DIAGNOSIS — I110 Hypertensive heart disease with heart failure: Secondary | ICD-10-CM | POA: Insufficient documentation

## 2020-02-24 DIAGNOSIS — E119 Type 2 diabetes mellitus without complications: Secondary | ICD-10-CM | POA: Diagnosis not present

## 2020-02-24 DIAGNOSIS — Z79899 Other long term (current) drug therapy: Secondary | ICD-10-CM | POA: Insufficient documentation

## 2020-02-24 DIAGNOSIS — Z7982 Long term (current) use of aspirin: Secondary | ICD-10-CM | POA: Diagnosis not present

## 2020-02-24 DIAGNOSIS — I255 Ischemic cardiomyopathy: Secondary | ICD-10-CM | POA: Insufficient documentation

## 2020-02-24 DIAGNOSIS — I493 Ventricular premature depolarization: Secondary | ICD-10-CM | POA: Diagnosis not present

## 2020-02-24 DIAGNOSIS — Z9981 Dependence on supplemental oxygen: Secondary | ICD-10-CM | POA: Insufficient documentation

## 2020-02-24 DIAGNOSIS — I2510 Atherosclerotic heart disease of native coronary artery without angina pectoris: Secondary | ICD-10-CM | POA: Diagnosis not present

## 2020-02-24 LAB — BASIC METABOLIC PANEL
Anion gap: 8 (ref 5–15)
BUN: 11 mg/dL (ref 8–23)
CO2: 22 mmol/L (ref 22–32)
Calcium: 9 mg/dL (ref 8.9–10.3)
Chloride: 104 mmol/L (ref 98–111)
Creatinine, Ser: 0.94 mg/dL (ref 0.61–1.24)
GFR calc Af Amer: >60 mL/min (ref >60)
GFR calc non Af Amer: >60 mL/min (ref >60)
Glucose, Bld: 222 mg/dL — ABNORMAL HIGH (ref 70–99)
Potassium: 3.9 mmol/L (ref 3.5–5.1)
Sodium: 134 mmol/L — ABNORMAL LOW (ref 135–145)

## 2020-02-24 LAB — LIPID PANEL
Cholesterol: 138 mg/dL (ref 0–200)
HDL: 38 mg/dL — ABNORMAL LOW (ref >40)
LDL Cholesterol: 46 mg/dL (ref 0–99)
Total CHOL/HDL Ratio: 3.6 RATIO
Triglycerides: 271 mg/dL — ABNORMAL HIGH (ref <150)
VLDL: 54 mg/dL — ABNORMAL HIGH (ref 0–40)

## 2020-02-24 NOTE — Patient Instructions (Signed)
It was great to see you today! No medication changes are needed at this time.  Labs today We will only contact you if something comes back abnormal or we need to make some changes. Otherwise no news is good news!  Your physician recommends that you schedule a follow-up appointment in: 4 months with Dr Shirlee Latch -we will call you closer to this time, if we have not called by October 2021, please give [place] a call to get on the schedule  Do the following things EVERYDAY: 1) Weigh yourself in the morning before breakfast. Write it down and keep it in a log. 2) Take your medicines as prescribed 3) Eat low salt foods--Limit salt (sodium) to 2000 mg per day.  4) Stay as active as you can everyday 5) Limit all fluids for the day to less than 2 liters  At the [facility], you and your health needs are our priority. As part of our continuing mission to provide you with exceptional heart care, we have created designated Provider Care Teams. These Care Teams include your primary Cardiologist (physician) and Advanced Practice Providers (APPs- Physician Assistants and Nurse Practitioners) who all work together to provide you with the care you need, when you need it.   You may see any of the following providers on your designated Care Team at your next follow up: Marland Kitchen Dr Arvilla Meres . Dr Marca Ancona . Tonye Becket, NP . Robbie Lis, PA . Karle Plumber, PharmD   Please be sure to bring in all your medications bottles to every appointment.

## 2020-02-25 NOTE — Progress Notes (Signed)
PCP: Hal Morales, NP Cardiology: Dr. Antoine Poche HF Cardiology: Dr. Shirlee Latch  64 y.o. with history of CAD, ischemic cardiomyopathy/chronic systolic CHF, OSA, and VT was referred by Dr. Antoine Poche for evaluation of CHF.  Patient has a long history of CAD (see PMH below).  Last cath in 5/20 showed a chronically occluded RCA with collaterals and stable stents in the LCx system, no intervention.  He has had an ischemic cardiomyopathy with EF 20-25% in 5/20, and actually up to 35-40% on 2/21 echo (I reviewed images today).  He has a Medtronic ICD and is on amiodarone for VT. He has significant OSA but has not been able to tolerate CPAP with a facemask.    CPX was done in 3/21, study was submaximal with moderate HF limitation but also functional limitation from pulmonary restriction and exertional hypoxemia.  V/Q scan was done and was not suggestive of chronic PE.  Zio patch in 3/21 showed 2% PVCs.   He was admitted in 5/21 with PNA on a background of interstitial lung disease (looks like UIP on high resolution CT chest from 4/21).  Seen by Dr. Isaiah Serge with pulmonary, thought probably not amiodarone lung toxicity but we stopped amiodarone and put him on mexiletine.  He was on oxygen in the hospital.   He returns for followup of CHF.  He is now using oxygen only at night (unable to tolerate CPAP).  He feels much better, more energy.  No lightheadedness.  No significant dyspnea walking on flat ground.  No chest pain.    Medtronic device interrogation: Stable thoracic impedance, no VT.   Labs (9/20): K 4.5, creatinine 1.08 Labs (3/21): hgb 16.9, K 4.5, creatinine 1.3, AST 34, ALT 54 (mildly elevated), LDL 51, TGs 373, TSH normal.  Labs (5/21): K 3.9, creatinine 1.07  PMH: 1. CAD:  - PCI to OM1 in 2012.  - DES to LCx in 5/19.  - LHC (5/20): 50% proximal LAD, 95% proximal D1, patent LCx and OM1 stents, total occlusion of RCA with collaterals.  2. Chronic systolic CHF: Ischemic cardiomyopathy. Medtronic ICD.   - RHC (5/20): mean RA 3, PA 25/5, mean PCWP 6, CI 1.86 - Echo (5/20): EF 20-25% - Echo (2/21): EF 35-40%, mild LV dilation, RV low normal function.  - CPX (3/21): peak VO2 17.6, VE/VCO2 slope 43, RER 0.96 => submaximal, moderate HF limitation but also noted is pulmonary restriction with exertional hypoxemia.  3. VT: Now on amiodarone.  4. OSA: Has not tolerate CPAP.  5. HTN 6. Type 2 diabetes 7. Hyperlipidemia: Has not tolerated statins.  8. PVCs - Zio patch (3/21) with 2% PVCs 9. Interstitial lung disease: high resolution CT chest in 4/21 concerning for usual interstitial pneumonitis  SH: Married, lives in [place], retired, quit smoking in 2007.   Family History  Problem Relation Age of Onset  . Lymphoma Mother   . Heart attack Father 62  . Heart attack Brother 51   ROS: All systems reviewed and negative except as per HPI.   Current Outpatient Medications  Medication Sig Dispense Refill  . acetaminophen (TYLENOL) 500 MG tablet Take 500 mg by mouth every 6 (six) hours as needed (for headaches.).    Marland Kitchen albuterol (VENTOLIN HFA) 108 (90 Base) MCG/ACT inhaler Inhale 1 puff into the lungs every 6 (six) hours as needed for wheezing or shortness of breath. 8 g 0  . Alirocumab (PRALUENT) 75 MG/ML SOAJ Inject 75 mg into the skin every 14 (fourteen) days. 2 pen 11  .  aspirin 81 MG chewable tablet Chew 1 tablet (81 mg total) by mouth daily.    . carvedilol (COREG) 6.25 MG tablet Take 1 tablet (6.25 mg total) by mouth 2 (two) times daily with a meal. 180 tablet 3  . CINNAMON PO Take 1,000 mg by mouth daily.    . clopidogrel (PLAVIX) 75 MG tablet TAKE 1 TABLET BY MOUTH EVERY DAY 90 tablet 3  . Coenzyme Q10 (CO Q 10) 100 MG CAPS Take 1 tablet by mouth daily.    . furosemide (LASIX) 40 MG tablet Take 1 tablet (40 mg total) by mouth 2 (two) times daily. Take 1 tablet daily 180 tablet 3  . icosapent Ethyl (VASCEPA) 1 g capsule Take 1 g by mouth 2 (two) times daily.    Marland Kitchen JARDIANCE 10 MG TABS tablet  Take 10 mg by mouth daily.    . magnesium oxide (MAG-OX) 400 MG tablet Take 400 mg by mouth 2 (two) times daily.    . metFORMIN (GLUCOPHAGE) 1000 MG tablet Take 1,000 mg by mouth 2 (two) times daily.    Marland Kitchen mexiletine (MEXITIL) 150 MG capsule Take 1 capsule (150 mg total) by mouth every 12 (twelve) hours. 60 capsule 3  . nitroGLYCERIN (NITROSTAT) 0.4 MG SL tablet Place 1 tablet (0.4 mg total) under the tongue every 5 (five) minutes as needed for chest pain. 30 tablet 1  . pantoprazole (PROTONIX) 40 MG tablet Take 40 mg by mouth every evening.     . pramipexole (MIRAPEX) 1 MG tablet Take 0.5 mg by mouth 4 (four) times daily.     . sacubitril-valsartan (ENTRESTO) 49-51 MG Take 1 tablet by mouth 2 (two) times daily. 60 tablet 3  . spironolactone (ALDACTONE) 25 MG tablet Take 1 tablet (25 mg total) by mouth daily. 90 tablet 3  . TOUJEO SOLOSTAR 300 UNIT/ML SOPN Inject 30 Units into the skin at bedtime.      No current facility-administered medications for this encounter.   BP 106/74   Pulse 70   Wt 93.9 kg (207 lb)   SpO2 95%   BMI 29.70 kg/m  General: NAD Neck: Thick, no JVD, no thyromegaly or thyroid nodule.  Lungs: Dry crackles at bases.  CV: Nondisplaced PMI.  Heart regular S1/S2, no S3/S4, no murmur.  No peripheral edema.  No carotid bruit.  Normal pedal pulses.  Abdomen: Soft, nontender, no hepatosplenomegaly, no distention.  Skin: Intact without lesions or rashes.  Neurologic: Alert and oriented x 3.  Psych: Normal affect. Extremities: No clubbing or cyanosis.  HEENT: Normal.   Assessment/Plan: 1. CAD: Cath in 5/20 showed chronically occluded RCA with patent LCx system stents.  No interventional target.  No chest pain recently.  He does not take statins due to myalgias.  - Continue ASA 81 daily and Plavix 75 mg daily. Could consider changing from Plavix to rivaroxaban 2.5 mg bid in the future (would be good candidate for COMPASS regimen).  - Continue Praluent, check lipids today.    2. Chronic systolic CHF: Ischemic cardiomyopathy.  Medtronic ICD.  Narrow QRS so not CRT candidate.  Echo in 5/20 with EF 20-25%, but echo in 2/21 looked better, with EF 35-40%.  CPX in 3/21 with moderate HF limitation.  Currently, NYHA class II symptoms.  He is not volume overloaded by exam or Optivol.   - Continue Lasix 40 mg bid.  BMET today.   - Continue Entresto 49/51 bid, had to decrease from top dose due to lightheadedness/soft BP.   -  Continue spironolactone 25 mg daily.  - Continue Coreg 6.25 mg bid.  BP is relatively soft, would hold off on increasing.   - Continue empagliflozin.  3. OSA: Untreated, sounds severe based on his wife's description.  He has not been able to tolerate CPAP in the past and is not willing to try again.  4. PVCs: History of PVCs, now on mexiletine. Most recent Zio patch in 3/21 with only 2% PVCs.  There was question of amiodarone lung toxicity during 5/21 admission and amiodarone was stopped, but pulmonary consult thought more likely UIP.  5. ILD: Restrictive PFTs noted with CPX.  CT chest in 4/21 concerning for UIP.  I stopped amiodarone due to concern that this could represent amiodarone lung toxicity, but pulmonary thought less likely than UIP.  - He has followup with pulmonary, ?anti-fibrotic treatment.    Followup in 4 months.   Marca Ancona 02/25/2020

## 2020-03-07 ENCOUNTER — Ambulatory Visit: Payer: BC Managed Care – PPO | Admitting: Pulmonary Disease

## 2020-03-07 ENCOUNTER — Other Ambulatory Visit: Payer: Self-pay

## 2020-03-07 ENCOUNTER — Encounter: Payer: Self-pay | Admitting: Pulmonary Disease

## 2020-03-07 VITALS — BP 128/64 | HR 74 | Temp 97.7°F | Ht 70.0 in | Wt 207.6 lb

## 2020-03-07 DIAGNOSIS — J849 Interstitial pulmonary disease, unspecified: Secondary | ICD-10-CM

## 2020-03-07 NOTE — Patient Instructions (Signed)
We will schedule pulmonary function test and 6-minute walk test  I think he will be a good candidate for a medication called Esbriet. Follow-up in September for review of these tests and discuss treatment plans for pulmonary fibrosis.

## 2020-03-07 NOTE — Progress Notes (Signed)
[name]    [id number]    [birth year]  Primary Care Physician:Gunter, Yolonda Kida, NP  Referring Physician: Hal Morales, NP [address] [place],  [place] [address]  Chief complaint:   Pulmonary fibrosis  HPI: 64 year old with hypertension, coronary artery disease, diabetes, OSA (non compliant), VT on amiodarone, ischemic cardiomyopathy Admitted to the hospital and May 2021 for respiratory failure, treated for community-acquired pneumonia Pulmonary service was consulted for high-resolution CT showing UIP fibrosis. There is a question of amiodarone toxicity and it was held during that admission and consultation with Dr. Shirlee Latch from cardiology.  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  Has a hobby of metal work and makes art with horse shoes.  He wears a mask while doing those. Smoking history: 53-pack-year smoker.  Quit in 2007 Travel history: No significant travel history Relevant family history: No significant family history of lung disease   Outpatient Encounter Medications as of 03/07/2020  Medication Sig  . acetaminophen (TYLENOL) 500 MG tablet Take 500 mg by mouth every 6 (six) hours as needed (for headaches.).  Marland Kitchen albuterol (VENTOLIN HFA) 108 (90 Base) MCG/ACT inhaler Inhale 1 puff into the lungs every 6 (six) hours as needed for wheezing or shortness of breath.  . Alirocumab (PRALUENT) 75 MG/ML SOAJ Inject 75 mg into the skin every 14 (fourteen) days.  Marland Kitchen aspirin 81 MG chewable tablet Chew 1 tablet (81 mg total) by mouth daily.  . carvedilol (COREG) 6.25 MG tablet Take 1 tablet (6.25 mg total) by mouth 2 (two) times daily with a meal.  . CINNAMON PO Take 1,000 mg by mouth daily.  . clopidogrel (PLAVIX) 75 MG tablet TAKE 1 TABLET BY MOUTH EVERY DAY  . Coenzyme Q10 (CO Q 10) 100 MG CAPS Take 1 tablet by mouth daily.  .  furosemide (LASIX) 40 MG tablet Take 1 tablet (40 mg total) by mouth 2 (two) times daily. Take 1 tablet daily  . icosapent Ethyl (VASCEPA) 1 g capsule Take 1 g by mouth 2 (two) times daily.  Marland Kitchen JARDIANCE 10 MG TABS tablet Take 10 mg by mouth daily.  . magnesium oxide (MAG-OX) 400 MG tablet Take 400 mg by mouth 2 (two) times daily.  . metFORMIN (GLUCOPHAGE) 1000 MG tablet Take 1,000 mg by mouth 2 (two) times daily.  Marland Kitchen mexiletine (MEXITIL) 150 MG capsule Take 1 capsule (150 mg total) by mouth every 12 (twelve) hours.  . nitroGLYCERIN (NITROSTAT) 0.4 MG SL tablet Place 1 tablet (0.4 mg total) under the tongue every 5 (five) minutes as needed for chest pain.  . pantoprazole (PROTONIX) 40 MG tablet Take 40 mg by mouth every evening.   . pramipexole (MIRAPEX) 1 MG tablet Take 0.5 mg by mouth 4 (four) times daily.   . sacubitril-valsartan (ENTRESTO) 49-51 MG Take 1 tablet by mouth 2 (two) times daily.  Marland Kitchen spironolactone (ALDACTONE) 25 MG tablet Take 1 tablet (25 mg total) by mouth daily.  [name] SOLOSTAR 300 UNIT/ML SOPN Inject 30 Units into the skin at bedtime.    No facility-administered encounter medications on file as of 03/07/2020.    Allergies as of 03/07/2020 - Review Complete 03/07/2020  Allergen Reaction Noted  . Brilinta [ticagrelor] Other (See Comments) 02/05/2018  . Codeine Other (See Comments) 12/29/2017  . Lipitor [atorvastatin calcium] Other (See Comments) 05/08/2011  .  Niaspan [niacin er] Other (See Comments) 05/08/2011    Past Medical History:  Diagnosis Date  . ASCVD (arteriosclerotic cardiovascular disease)    a. MI 2004, did not seek care at that time. b. s/p PTCA/BMS to OM1 04/2011 (known totally occluded RCA with L-R collaterals and failed angioplasty on that vessel). c.)  Cath 2019 chronically occluded right coronary artery with collateralization.  90% circumflex stenosis treated with a DES.  Marland Kitchen Chronic bronchitis (HCC)   . HTN (hypertension)   . Hyperlipidemia   .  Ischemic cardiomyopathy    EF 30%.    . OSA (obstructive sleep apnea) 05/24/11  . Restless leg syndrome 06/25/2011  . Type 2 diabetes mellitus (HCC)     Past Surgical History:  Procedure Laterality Date  . CARDIAC CATHETERIZATION  11/24/2012   40-50% ostial LAD, 40% mid LAD, 60-70% ostial D1, 40% proximal LCx, patent OM branch stent, 99% serial proximal-mid RCA lesions, 100% mid RCA occlusion (chronic with left to right collateralization); EF 40%, inferior wall hypokinesis.  Marland Kitchen CARDIAC CATHETERIZATION N/A 05/31/2015   Procedure: Left Heart Cath and Coronary Angiography;  Surgeon: Kathleene Hazel, MD;  Location: [facility] INVASIVE CV LAB;  Service: Cardiovascular;  Laterality: N/A;  . CORONARY STENT INTERVENTION N/A 12/25/2017   Procedure: CORONARY STENT INTERVENTION;  Surgeon: Swaziland, Peter M, MD;  Location: [facility] INVASIVE CV LAB;  Service: Cardiovascular;  Laterality: N/A;  . CORONARY STENT PLACEMENT  04/2011  . CYSTECTOMY     Spine and jaw  . ICD IMPLANT N/A 07/19/2018   Procedure: ICD IMPLANT;  Surgeon: Regan Lemming, MD;  Location: [facility] INVASIVE CV LAB;  Service: Cardiovascular;  Laterality: N/A;  . LEFT HEART CATH AND CORONARY ANGIOGRAPHY N/A 12/25/2017   Procedure: LEFT HEART CATH AND CORONARY ANGIOGRAPHY;  Surgeon: Swaziland, Peter M, MD;  Location: [facility] INVASIVE CV LAB;  Service: Cardiovascular;  Laterality: N/A;  . LEFT HEART CATHETERIZATION WITH CORONARY ANGIOGRAM N/A 11/24/2012   Procedure: LEFT HEART CATHETERIZATION WITH CORONARY ANGIOGRAM;  Surgeon: Kathleene Hazel, MD;  Location: [facility] CATH LAB;  Service: Cardiovascular;  Laterality: N/A;  . lesion removed from foot     right foot  . RIGHT/LEFT HEART CATH AND CORONARY ANGIOGRAPHY N/A 01/06/2019   Procedure: RIGHT/LEFT HEART CATH AND CORONARY ANGIOGRAPHY;  Surgeon: Runell Gess, MD;  Location: [facility] INVASIVE CV LAB;  Service: Cardiovascular;  Laterality: N/A;  . TONSILLECTOMY AND ADENOIDECTOMY      Family History  Problem  Relation Age of Onset  . Lymphoma Mother   . Heart attack Father 4  . Heart attack Brother 67    Social History   Socioeconomic History  . Marital status: Married    Spouse name: Not on file  . Number of children: 1  . Years of education: Not on file  . Highest education level: Not on file  Occupational History  . Occupation: Wellsite geologist   Tobacco Use  . Smoking status: Former Smoker    Packs/day: 1.50    Years: 35.00    Pack years: 52.50    Types: Cigarettes    Quit date: 08/18/2005    Years since quitting: 14.5  . Smokeless tobacco: Never Used  Vaping Use  . Vaping Use: Never used  Substance and Sexual Activity  . Alcohol use: No  . Drug use: No  . Sexual activity: Not Currently  Other Topics Concern  . Not on file  Social History Narrative   Married with 1 grown daughter who is a Diplomatic Services operational officer for  a school         Social Determinants of Corporate investment banker Strain:   . Difficulty of Paying Living Expenses:   Food Insecurity:   . Worried About Programme researcher, broadcasting/film/video in the Last Year:   . Barista in the Last Year:   Transportation Needs:   . Freight forwarder (Medical):   Marland Kitchen Lack of Transportation (Non-Medical):   Physical Activity:   . Days of Exercise per Week:   . Minutes of Exercise per Session:   Stress:   . Feeling of Stress :   Social Connections:   . Frequency of Communication with Friends and Family:   . Frequency of Social Gatherings with Friends and Family:   . Attends Religious Services:   . Active Member of Clubs or Organizations:   . Attends Banker Meetings:   Marland Kitchen Marital Status:   Intimate Partner Violence:   . Fear of Current or Ex-Partner:   . Emotionally Abused:   Marland Kitchen Physically Abused:   . Sexually Abused:     Review of systems: Review of Systems  Constitutional: Negative for fever and chills.  HENT: Negative.   Eyes: Negative for blurred vision.  Respiratory: as per HPI  Cardiovascular: Negative for  chest pain and palpitations.  Gastrointestinal: Negative for vomiting, diarrhea, blood per rectum. Genitourinary: Negative for dysuria, urgency, frequency and hematuria.  Musculoskeletal: Negative for myalgias, back pain and joint pain.  Skin: Negative for itching and rash.  Neurological: Negative for dizziness, tremors, focal weakness, seizures and loss of consciousness.  Endo/Heme/Allergies: Negative for environmental allergies.  Psychiatric/Behavioral: Negative for depression, suicidal ideas and hallucinations.  All other systems reviewed and are negative.  Physical Exam: Blood pressure 128/64, pulse 74, temperature 97.7 F (36.5 C), temperature source Oral, height 5\' 10"  (1.778 m), weight 207 lb 9.6 oz (94.2 kg), SpO2 95 %. Gen:      No acute distress HEENT:  EOMI, sclera anicteric Neck:     No masses; no thyromegaly Lungs:    Clear to auscultation bilaterally; normal respiratory effort CV:         Regular rate and rhythm; no murmurs Abd:      + bowel sounds; soft, non-tender; no palpable masses, no distension Ext:    No edema; adequate peripheral perfusion Skin:      Warm and dry; no rash Neuro: alert and oriented x 3 Psych: normal mood and affect  Data Reviewed: Imaging: High-resolution CT 12/13/2019-basilar predominant fibrotic lung disease UIP pattern with progression compared to 2020.  I have reviewed the images personally  PFTs:  Labs: Autoimmune work-up 12/23/2019-ANA, rheumatoid factor, CCP, myositis panel, aldolase, CK-negative  Cardiac: RHC 01/06/19 Pulmonary artery pressure- 25/5, mean 14 Pulmonary wedge pressure- A-wave 7, V wave 6, mean 6 LVEDP- 7 Cardiac output-3.95 L/min  Assessment:  Pulmonary fibrosis in UIP pattern Has remote asbestos exposure, smoking history and amiodarone use  CT does not look like amiodarone toxicity. But have stopped amio in consultation with Dr. 01/08/19 cardiology given significant underlying lung disease. Differential diagnosis  most likely IPF versus progressive asbestosis I think asbestosis is less likely as he does not have any pleural disease and exposure was 50 years ago. Regardless of etiology as he is candidate for antifibrotic therapy due to progressive phenotype.  Schedule pulmonary function test, 6-minute walk test We discussed the treatment options available and decided to go with Esbriet He is due to change his insurance and go on Shirlee Latch  in September. I will see him after he is on his new insurance to initiate paperwork.  Plan/Recommendations: PFTs, 6-minute walk test Follow-up in 2 months to initiate Esbriet paperwork  Chilton Greathouse MD [facility] 03/07/2020, 12:07 PM  CC: Hal Morales, NP

## 2020-03-10 ENCOUNTER — Encounter: Payer: Self-pay | Admitting: Pulmonary Disease

## 2020-04-04 ENCOUNTER — Ambulatory Visit: Payer: BC Managed Care – PPO

## 2020-04-04 ENCOUNTER — Ambulatory Visit: Payer: BC Managed Care – PPO | Admitting: Adult Health

## 2020-04-16 ENCOUNTER — Other Ambulatory Visit (HOSPITAL_COMMUNITY): Payer: Self-pay | Admitting: Cardiology

## 2020-04-16 ENCOUNTER — Other Ambulatory Visit (HOSPITAL_COMMUNITY): Payer: Self-pay | Admitting: *Deleted

## 2020-04-16 MED ORDER — MEXILETINE HCL 150 MG PO CAPS: 150.0000 mg | ORAL_CAPSULE | Freq: Two times a day (BID) | ORAL | 3 refills | Status: DC

## 2020-04-24 ENCOUNTER — Ambulatory Visit (INDEPENDENT_AMBULATORY_CARE_PROVIDER_SITE_OTHER): Payer: Medicare Other | Admitting: *Deleted

## 2020-04-24 DIAGNOSIS — I255 Ischemic cardiomyopathy: Secondary | ICD-10-CM

## 2020-04-24 LAB — CUP PACEART REMOTE DEVICE CHECK
Battery Remaining Longevity: 122 mo
Battery Voltage: 3.01 V
Brady Statistic RV Percent Paced: 0.09 %
Date Time Interrogation Session: 20210907033524
HighPow Impedance: 80 Ohm
Implantable Lead Implant Date: 20191202
Implantable Lead Location: 753860
Implantable Pulse Generator Implant Date: 20191202
Lead Channel Impedance Value: 285 Ohm
Lead Channel Impedance Value: 380 Ohm
Lead Channel Pacing Threshold Amplitude: 0.75 V
Lead Channel Pacing Threshold Pulse Width: 0.4 ms
Lead Channel Sensing Intrinsic Amplitude: 2.5 mV
Lead Channel Sensing Intrinsic Amplitude: 2.5 mV
Lead Channel Setting Pacing Amplitude: 2.5 V
Lead Channel Setting Pacing Pulse Width: 0.4 ms
Lead Channel Setting Sensing Sensitivity: 0.3 mV

## 2020-04-26 NOTE — Progress Notes (Signed)
Remote ICD transmission.   

## 2020-06-28 ENCOUNTER — Telehealth (HOSPITAL_COMMUNITY): Payer: Self-pay | Admitting: Pharmacist

## 2020-06-28 NOTE — Telephone Encounter (Signed)
Patient Advocate Encounter   Received notification from [employer] Part [employer] that prior authorization for Mexiletine is required.   PA submitted on CoverMyMeds Key [id number] Status is pending   Will continue to follow.   Karle Plumber, PharmD, BCPS, BCCP, CPP Heart Failure Clinic Pharmacist [phone]

## 2020-06-28 NOTE — Telephone Encounter (Signed)
Advanced Heart Failure Patient Advocate Encounter  Prior Authorization for mexiletine has been approved.    Effective dates: 04/18/20 through 06/28/21  Karle Plumber, PharmD, BCPS, BCCP, CPP Heart Failure Clinic Pharmacist [phone]

## 2020-07-12 ENCOUNTER — Other Ambulatory Visit: Payer: Self-pay | Admitting: Cardiology

## 2020-07-24 ENCOUNTER — Ambulatory Visit (INDEPENDENT_AMBULATORY_CARE_PROVIDER_SITE_OTHER): Payer: Medicare Other

## 2020-07-24 DIAGNOSIS — I255 Ischemic cardiomyopathy: Secondary | ICD-10-CM | POA: Diagnosis not present

## 2020-07-24 LAB — CUP PACEART REMOTE DEVICE CHECK
Battery Remaining Longevity: 121 mo
Battery Voltage: 3.01 V
Brady Statistic RV Percent Paced: 0.12 %
Date Time Interrogation Session: 20211207033526
HighPow Impedance: 79 Ohm
Implantable Lead Implant Date: 20191202
Implantable Lead Location: 753860
Implantable Pulse Generator Implant Date: 20191202
Lead Channel Impedance Value: 323 Ohm
Lead Channel Impedance Value: 380 Ohm
Lead Channel Pacing Threshold Amplitude: 0.875 V
Lead Channel Pacing Threshold Pulse Width: 0.4 ms
Lead Channel Sensing Intrinsic Amplitude: 2.625 mV
Lead Channel Sensing Intrinsic Amplitude: 2.625 mV
Lead Channel Setting Pacing Amplitude: 2.5 V
Lead Channel Setting Pacing Pulse Width: 0.4 ms
Lead Channel Setting Sensing Sensitivity: 0.3 mV

## 2020-08-03 NOTE — Progress Notes (Signed)
Remote ICD transmission.   

## 2020-08-15 ENCOUNTER — Other Ambulatory Visit (HOSPITAL_COMMUNITY): Payer: Self-pay | Admitting: Cardiology

## 2020-09-12 ENCOUNTER — Other Ambulatory Visit (HOSPITAL_COMMUNITY): Payer: Self-pay | Admitting: Cardiology

## 2020-09-14 ENCOUNTER — Telehealth (HOSPITAL_COMMUNITY): Payer: Self-pay | Admitting: Cardiology

## 2020-09-14 MED ORDER — MEXILETINE HCL 150 MG PO CAPS: ORAL_CAPSULE | ORAL | 0 refills | Status: DC

## 2020-09-14 NOTE — Telephone Encounter (Signed)
Pt request  Mexiletine refill, please send script to [facility], pt scheduled appt 03/25.  Thanks

## 2020-10-01 ENCOUNTER — Other Ambulatory Visit (HOSPITAL_COMMUNITY): Payer: Self-pay | Admitting: Cardiology

## 2020-10-13 ENCOUNTER — Other Ambulatory Visit: Payer: Self-pay | Admitting: Cardiology

## 2020-10-19 ENCOUNTER — Emergency Department (HOSPITAL_COMMUNITY)
Admission: EM | Admit: 2020-10-19 | Discharge: 2020-10-19 | Disposition: A | Discharge status: Home / Self Care | Payer: Medicare Other | Attending: Emergency Medicine | Admitting: Emergency Medicine

## 2020-10-19 ENCOUNTER — Encounter (HOSPITAL_COMMUNITY): Payer: Self-pay | Admitting: Pharmacy Technician

## 2020-10-19 ENCOUNTER — Other Ambulatory Visit: Payer: Self-pay

## 2020-10-19 DIAGNOSIS — I5023 Acute on chronic systolic (congestive) heart failure: Secondary | ICD-10-CM | POA: Diagnosis not present

## 2020-10-19 DIAGNOSIS — M545 Low back pain, unspecified: Secondary | ICD-10-CM | POA: Diagnosis present

## 2020-10-19 DIAGNOSIS — Z7902 Long term (current) use of antithrombotics/antiplatelets: Secondary | ICD-10-CM | POA: Diagnosis not present

## 2020-10-19 DIAGNOSIS — I2510 Atherosclerotic heart disease of native coronary artery without angina pectoris: Secondary | ICD-10-CM | POA: Insufficient documentation

## 2020-10-19 DIAGNOSIS — I10 Essential (primary) hypertension: Secondary | ICD-10-CM | POA: Diagnosis not present

## 2020-10-19 DIAGNOSIS — Z955 Presence of coronary angioplasty implant and graft: Secondary | ICD-10-CM | POA: Diagnosis not present

## 2020-10-19 DIAGNOSIS — E119 Type 2 diabetes mellitus without complications: Secondary | ICD-10-CM | POA: Diagnosis not present

## 2020-10-19 DIAGNOSIS — Z7982 Long term (current) use of aspirin: Secondary | ICD-10-CM | POA: Insufficient documentation

## 2020-10-19 DIAGNOSIS — I110 Hypertensive heart disease with heart failure: Secondary | ICD-10-CM | POA: Diagnosis not present

## 2020-10-19 DIAGNOSIS — Z87891 Personal history of nicotine dependence: Secondary | ICD-10-CM | POA: Diagnosis not present

## 2020-10-19 DIAGNOSIS — Z794 Long term (current) use of insulin: Secondary | ICD-10-CM | POA: Diagnosis not present

## 2020-10-19 DIAGNOSIS — Z7984 Long term (current) use of oral hypoglycemic drugs: Secondary | ICD-10-CM | POA: Insufficient documentation

## 2020-10-19 DIAGNOSIS — Z79899 Other long term (current) drug therapy: Secondary | ICD-10-CM | POA: Diagnosis not present

## 2020-10-19 DIAGNOSIS — M5442 Lumbago with sciatica, left side: Secondary | ICD-10-CM | POA: Diagnosis not present

## 2020-10-19 DIAGNOSIS — M5432 Sciatica, left side: Secondary | ICD-10-CM

## 2020-10-19 MED ORDER — HYDROCODONE-ACETAMINOPHEN 5-325 MG PO TABS: 1.0000 | ORAL_TABLET | Freq: Four times a day (QID) | ORAL | 0 refills | Status: DC | PRN

## 2020-10-19 NOTE — Discharge Instructions (Addendum)
Continue your muscle relaxer rest off your feet as much as possible.  Keep your appointment to see your primary care doctor on March 15.  You should be improving by then.  If you are not consideration for MRI will be needed.  Also they could consider a trial of steroids.  Take the pain medication as needed.  It would not send electronically to that pharmacy.  So we have printed it.

## 2020-10-19 NOTE — ED Provider Notes (Signed)
[facility] EMERGENCY DEPARTMENT Provider Note   CSN: [id number] Arrival date & time: 10/19/20  1640     History Chief Complaint  Patient presents with  . Leg Pain    [name] is a 65 y.o. male.  Patient with a left-sided low back pain.  On Monday.  Now with a complaint of pain left buttocks radiating into the thigh.  No numbness or weakness to the foot.  No swelling to the foot no ankle swelling.  No fall or injury.  Patient's had trouble with the back before similar to this but has not lasted as long or has not been as severe.  His primary care doctor started him on muscle relaxer Flexeril.  Patient does not have any pain medication.        Past Medical History:  Diagnosis Date  . ASCVD (arteriosclerotic cardiovascular disease)    a. MI 2004, did not seek care at that time. b. s/p PTCA/BMS to OM1 04/2011 (known totally occluded RCA with L-R collaterals and failed angioplasty on that vessel). c.)  Cath 2019 chronically occluded right coronary artery with collateralization.  90% circumflex stenosis treated with a DES.  Marland Kitchen Chronic bronchitis (HCC)   . HTN (hypertension)   . Hyperlipidemia   . Ischemic cardiomyopathy    EF 30%.    . OSA (obstructive sleep apnea) 05/24/11  . Restless leg syndrome 06/25/2011  . Type 2 diabetes mellitus [facility])     Patient Active Problem List   Diagnosis Date Noted  . Acute respiratory failure with hypoxia (HCC)   . Community acquired pneumonia 12/21/2019  . ICD (implantable cardioverter-defibrillator) in place 03/31/2019  . Educated about COVID-19 virus infection 01/14/2019  . Coronary artery disease involving native coronary artery of native heart without angina pectoris 01/14/2019  . Ventricular tachyarrhythmia (HCC) 01/09/2019  . VT (ventricular tachycardia) (HCC) 01/09/2019  . Dyspnea on effort 01/04/2019  . Thrombocytopenia (HCC) 01/22/2018  . Dyspnea 01/21/2018  . Acute on chronic systolic CHF (congestive heart  failure) (HCC) 12/25/2017  . Unstable angina (HCC) 12/24/2017  . Cardiomyopathy, ischemic   . Essential hypertension 11/25/2012  . Chronic bronchitis (HCC) 11/25/2012  . Insulin dependent diabetes mellitus (HCC) 11/25/2012  . Bradycardia 11/25/2012  . Restless leg syndrome 06/25/2011  . Dyslipidemia 06/05/2011  . OSA (obstructive sleep apnea) 06/05/2011  . CAD S/P percutaneous coronary angioplasty 05/09/2011    Past Surgical History:  Procedure Laterality Date  . CARDIAC CATHETERIZATION  11/24/2012   40-50% ostial LAD, 40% mid LAD, 60-70% ostial D1, 40% proximal LCx, patent OM branch stent, 99% serial proximal-mid RCA lesions, 100% mid RCA occlusion (chronic with left to right collateralization); EF 40%, inferior wall hypokinesis.  Marland Kitchen CARDIAC CATHETERIZATION N/A 05/31/2015   Procedure: Left Heart Cath and Coronary Angiography;  Surgeon: Kathleene Hazel, MD;  Location: [facility] INVASIVE CV LAB;  Service: Cardiovascular;  Laterality: N/A;  . CORONARY STENT INTERVENTION N/A 12/25/2017   Procedure: CORONARY STENT INTERVENTION;  Surgeon: Swaziland, Peter M, MD;  Location: [facility] INVASIVE CV LAB;  Service: Cardiovascular;  Laterality: N/A;  . CORONARY STENT PLACEMENT  04/2011  . CYSTECTOMY     Spine and jaw  . ICD IMPLANT N/A 07/19/2018   Procedure: ICD IMPLANT;  Surgeon: Regan Lemming, MD;  Location: [facility] INVASIVE CV LAB;  Service: Cardiovascular;  Laterality: N/A;  . LEFT HEART CATH AND CORONARY ANGIOGRAPHY N/A 12/25/2017   Procedure: LEFT HEART CATH AND CORONARY ANGIOGRAPHY;  Surgeon: Swaziland, Peter M, MD;  Location:  [facility] INVASIVE CV LAB;  Service: Cardiovascular;  Laterality: N/A;  . LEFT HEART CATHETERIZATION WITH CORONARY ANGIOGRAM N/A 11/24/2012   Procedure: LEFT HEART CATHETERIZATION WITH CORONARY ANGIOGRAM;  Surgeon: Kathleene Hazel, MD;  Location: [facility] CATH LAB;  Service: Cardiovascular;  Laterality: N/A;  . lesion removed from foot     right foot  . RIGHT/LEFT HEART CATH AND CORONARY  ANGIOGRAPHY N/A 01/06/2019   Procedure: RIGHT/LEFT HEART CATH AND CORONARY ANGIOGRAPHY;  Surgeon: Runell Gess, MD;  Location: [facility] INVASIVE CV LAB;  Service: Cardiovascular;  Laterality: N/A;  . TONSILLECTOMY AND ADENOIDECTOMY         Family History  Problem Relation Age of Onset  . Lymphoma Mother   . Heart attack Father 51  . Heart attack Brother 29    Social History   Tobacco Use  . Smoking status: Former Smoker    Packs/day: 1.50    Years: 35.00    Pack years: 52.50    Types: Cigarettes    Quit date: 08/18/2005    Years since quitting: 15.1  . Smokeless tobacco: Never Used  Vaping Use  . Vaping Use: Never used  Substance Use Topics  . Alcohol use: No  . Drug use: No    Home Medications Prior to Admission medications   Medication Sig Start Date End Date Taking? Authorizing Provider  HYDROcodone-acetaminophen (NORCO/VICODIN) 5-325 MG tablet Take 1 tablet by mouth every 6 (six) hours as needed for moderate pain. 10/19/20  Yes Vanetta Mulders, MD  acetaminophen (TYLENOL) 500 MG tablet Take 500 mg by mouth every 6 (six) hours as needed (for headaches.).    [provider]  albuterol (VENTOLIN HFA) 108 (90 Base) MCG/ACT inhaler Inhale 1 puff into the lungs every 6 (six) hours as needed for wheezing or shortness of breath. 12/26/19   Regalado, Belkys A, MD  aspirin 81 MG chewable tablet Chew 1 tablet (81 mg total) by mouth daily. 01/06/19   Arty Baumgartner, NP  carvedilol (COREG) 6.25 MG tablet TAKE 1 TABLET(6.25 MG) BY MOUTH TWICE DAILY WITH A MEAL 07/13/20   Rollene Rotunda, MD  CINNAMON PO Take 1,000 mg by mouth daily.    [provider]  clopidogrel (PLAVIX) 75 MG tablet TAKE 1 TABLET BY MOUTH EVERY DAY 07/11/19   Rollene Rotunda, MD  Coenzyme Q10 (CO Q 10) 100 MG CAPS Take 1 tablet by mouth daily.    [provider]  ENTRESTO 49-51 MG TAKE 1 TABLET BY MOUTH TWICE DAILY 04/16/20   Laurey Morale, MD  furosemide (LASIX) 40 MG tablet Take 1  tablet (40 mg total) by mouth 2 (two) times daily. Take 1 tablet daily 10/17/19   Laurey Morale, MD  icosapent Ethyl (VASCEPA) 1 g capsule Take 1 g by mouth 2 (two) times daily.    [provider]  JARDIANCE 10 MG TABS tablet Take 10 mg by mouth daily. 10/05/17   [provider]  magnesium oxide (MAG-OX) 400 MG tablet Take 400 mg by mouth 2 (two) times daily.    [provider]  metFORMIN (GLUCOPHAGE) 1000 MG tablet Take 1,000 mg by mouth 2 (two) times daily. 10/05/17   [provider]  mexiletine (MEXITIL) 150 MG capsule TAKE 1 CAPSULE(150 MG) BY MOUTH EVERY 12 HOURS 09/14/20   Laurey Morale, MD  nitroGLYCERIN (NITROSTAT) 0.4 MG SL tablet Place 1 tablet (0.4 mg total) under the tongue every 5 (five) minutes as needed for chest pain. 04/20/15   Rollene Rotunda,  MD  pantoprazole (PROTONIX) 40 MG tablet Take 40 mg by mouth every evening.     [provider]  PRALUENT 75 MG/ML SOAJ ADMINISTER 1 ML UNDER THE SKIN EVERY 14 DAYS 10/15/20   Laurey Morale, MD  pramipexole (MIRAPEX) 1 MG tablet Take 0.5 mg by mouth 4 (four) times daily.     [provider]  spironolactone (ALDACTONE) 25 MG tablet Take 1 tablet (25 mg total) by mouth once for 1 dose. Needs doctor appointment for further refill. 10/01/20 10/01/20  Laurey Morale, MD  TOUJEO SOLOSTAR 300 UNIT/ML SOPN Inject 30 Units into the skin at bedtime.  10/14/18   [provider]    Allergies    Brilinta [ticagrelor], Codeine, Lipitor [atorvastatin calcium], and Niaspan [niacin er]  Review of Systems   Review of Systems  Constitutional: Negative for chills and fever.  HENT: Negative for rhinorrhea and sore throat.   Eyes: Negative for visual disturbance.  Respiratory: Negative for cough and shortness of breath.   Cardiovascular: Negative for chest pain and leg swelling.  Gastrointestinal: Negative for abdominal pain, diarrhea, nausea and vomiting.  Genitourinary: Negative for dysuria.   Musculoskeletal: Positive for back pain. Negative for neck pain.  Skin: Negative for rash.  Neurological: Negative for dizziness, weakness, light-headedness, numbness and headaches.  Hematological: Does not bruise/bleed easily.  Psychiatric/Behavioral: Negative for confusion.    Physical Exam Updated Vital Signs BP 124/77 (BP Location: Right Arm)   Pulse 73   Temp (!) 97.4 F (36.3 C) (Oral)   Resp 16   SpO2 93%   Physical Exam Vitals and nursing note reviewed.  Constitutional:      Appearance: Normal appearance. He is well-developed and well-nourished.  HENT:     Head: Normocephalic and atraumatic.  Eyes:     Extraocular Movements: Extraocular movements intact.     Conjunctiva/sclera: Conjunctivae normal.     Pupils: Pupils are equal, round, and reactive to light.  Cardiovascular:     Rate and Rhythm: Normal rate and regular rhythm.     Heart sounds: No murmur heard.   Pulmonary:     Effort: Pulmonary effort is normal. No respiratory distress.     Breath sounds: Normal breath sounds.  Abdominal:     Palpations: Abdomen is soft.     Tenderness: There is no abdominal tenderness.  Musculoskeletal:        General: No swelling, tenderness or edema.     Cervical back: Normal range of motion and neck supple.     Comments: No swelling to the left foot or ankle.  Dorsalis pedis pulses 2+.  Sensation intact.  Calf has no tightness.  No tightness to the thigh.  No skin rash.  Currently no tenderness to palpation to the lumbar back area.  Skin:    General: Skin is warm and dry.  Neurological:     General: No focal deficit present.     Mental Status: He is alert and oriented to person, place, and time.     Cranial Nerves: No cranial nerve deficit.     Sensory: No sensory deficit.     Motor: No weakness.  Psychiatric:        Mood and Affect: Mood and affect normal.     ED Results / Procedures / Treatments   Labs (all labs ordered are listed, but only abnormal results are  displayed) Labs Reviewed - No data to display  EKG None  Radiology No results found.  Procedures Procedures  Medications Ordered in ED Medications - No data to display  ED Course  I have reviewed the triage vital signs and the nursing notes.  Pertinent labs & imaging results that were available during my care of the patient were reviewed by me and considered in my medical decision making (see chart for details).    MDM Rules/Calculators/A&P                          Patient symptoms consistent with a left-sided sciatica.  Recommend rest.  Continue the muscle relaxer.  Hydrocodone as needed for pain.  Keep follow-up with your primary care doctor in about 7 days.  If symptoms or not improving at the 2-week mark consideration for MRI or steroids.  No concerns for deep vein thrombosis.  No concerns for any arterial insufficiency.    Final Clinical Impression(s) / ED Diagnoses Final diagnoses:  Sciatica of left side    Rx / DC Orders ED Discharge Orders         Ordered    HYDROcodone-acetaminophen (NORCO/VICODIN) 5-325 MG tablet  Every 6 hours PRN        10/19/20 2309           Vanetta Mulders, MD 10/19/20 2313

## 2020-10-19 NOTE — ED Triage Notes (Signed)
Pt here with L sided back pain shooting down his leg. Pt states the pain comes out of nowhere and he is having trouble walking due to the pain. Pt reports this has been going on for 4 days.

## 2020-10-23 ENCOUNTER — Ambulatory Visit (INDEPENDENT_AMBULATORY_CARE_PROVIDER_SITE_OTHER): Payer: Medicare Other

## 2020-10-23 DIAGNOSIS — I255 Ischemic cardiomyopathy: Secondary | ICD-10-CM | POA: Diagnosis not present

## 2020-10-23 LAB — CUP PACEART REMOTE DEVICE CHECK
Battery Remaining Longevity: 119 mo
Battery Voltage: 3.01 V
Brady Statistic RV Percent Paced: 0.04 %
Date Time Interrogation Session: 20220308033623
HighPow Impedance: 79 Ohm
Implantable Lead Implant Date: 20191202
Implantable Lead Location: 753860
Implantable Pulse Generator Implant Date: 20191202
Lead Channel Impedance Value: 285 Ohm
Lead Channel Impedance Value: 380 Ohm
Lead Channel Pacing Threshold Amplitude: 0.875 V
Lead Channel Pacing Threshold Pulse Width: 0.4 ms
Lead Channel Sensing Intrinsic Amplitude: 3.375 mV
Lead Channel Sensing Intrinsic Amplitude: 3.375 mV
Lead Channel Setting Pacing Amplitude: 2.5 V
Lead Channel Setting Pacing Pulse Width: 0.4 ms
Lead Channel Setting Sensing Sensitivity: 0.3 mV

## 2020-11-01 NOTE — Progress Notes (Signed)
Remote ICD transmission.   

## 2020-11-09 ENCOUNTER — Encounter (HOSPITAL_COMMUNITY): Payer: Self-pay | Admitting: Cardiology

## 2020-11-09 ENCOUNTER — Ambulatory Visit (HOSPITAL_COMMUNITY)
Admission: RE | Admit: 2020-11-09 | Discharge: 2020-11-09 | Disposition: A | Discharge status: Home / Self Care | Payer: Medicare Other | Source: Ambulatory Visit | Attending: Cardiology | Admitting: Cardiology

## 2020-11-09 ENCOUNTER — Other Ambulatory Visit: Payer: Self-pay

## 2020-11-09 VITALS — BP 118/78 | HR 72 | Wt 203.8 lb

## 2020-11-09 DIAGNOSIS — Z87891 Personal history of nicotine dependence: Secondary | ICD-10-CM | POA: Diagnosis not present

## 2020-11-09 DIAGNOSIS — I2510 Atherosclerotic heart disease of native coronary artery without angina pectoris: Secondary | ICD-10-CM | POA: Insufficient documentation

## 2020-11-09 DIAGNOSIS — E785 Hyperlipidemia, unspecified: Secondary | ICD-10-CM | POA: Diagnosis not present

## 2020-11-09 DIAGNOSIS — Z955 Presence of coronary angioplasty implant and graft: Secondary | ICD-10-CM | POA: Diagnosis not present

## 2020-11-09 DIAGNOSIS — Z794 Long term (current) use of insulin: Secondary | ICD-10-CM | POA: Insufficient documentation

## 2020-11-09 DIAGNOSIS — R42 Dizziness and giddiness: Secondary | ICD-10-CM | POA: Diagnosis not present

## 2020-11-09 DIAGNOSIS — I110 Hypertensive heart disease with heart failure: Secondary | ICD-10-CM | POA: Diagnosis present

## 2020-11-09 DIAGNOSIS — I5022 Chronic systolic (congestive) heart failure: Secondary | ICD-10-CM | POA: Diagnosis not present

## 2020-11-09 DIAGNOSIS — Z7902 Long term (current) use of antithrombotics/antiplatelets: Secondary | ICD-10-CM | POA: Insufficient documentation

## 2020-11-09 DIAGNOSIS — G4733 Obstructive sleep apnea (adult) (pediatric): Secondary | ICD-10-CM | POA: Diagnosis not present

## 2020-11-09 DIAGNOSIS — I493 Ventricular premature depolarization: Secondary | ICD-10-CM | POA: Diagnosis not present

## 2020-11-09 DIAGNOSIS — Z79899 Other long term (current) drug therapy: Secondary | ICD-10-CM | POA: Insufficient documentation

## 2020-11-09 DIAGNOSIS — Z8249 Family history of ischemic heart disease and other diseases of the circulatory system: Secondary | ICD-10-CM | POA: Insufficient documentation

## 2020-11-09 DIAGNOSIS — J849 Interstitial pulmonary disease, unspecified: Secondary | ICD-10-CM | POA: Diagnosis not present

## 2020-11-09 DIAGNOSIS — Z7982 Long term (current) use of aspirin: Secondary | ICD-10-CM | POA: Insufficient documentation

## 2020-11-09 DIAGNOSIS — R0602 Shortness of breath: Secondary | ICD-10-CM | POA: Insufficient documentation

## 2020-11-09 DIAGNOSIS — M791 Myalgia, unspecified site: Secondary | ICD-10-CM | POA: Diagnosis not present

## 2020-11-09 HISTORY — DX: Heart failure, unspecified: I50.9

## 2020-11-09 LAB — BASIC METABOLIC PANEL WITH GFR
Anion gap: 7 (ref 5–15)
BUN: 18 mg/dL (ref 8–23)
CO2: 23 mmol/L (ref 22–32)
Calcium: 9.4 mg/dL (ref 8.9–10.3)
Chloride: 106 mmol/L (ref 98–111)
Creatinine, Ser: 1 mg/dL (ref 0.61–1.24)
GFR, Estimated: >60 mL/min
Glucose, Bld: 143 mg/dL — ABNORMAL HIGH (ref 70–99)
Potassium: 4.1 mmol/L (ref 3.5–5.1)
Sodium: 136 mmol/L (ref 135–145)

## 2020-11-09 LAB — LIPID PANEL
Cholesterol: 129 mg/dL (ref 0–200)
HDL: 35 mg/dL — ABNORMAL LOW (ref >40)
LDL Cholesterol: 45 mg/dL (ref 0–99)
Total CHOL/HDL Ratio: 3.7 RATIO
Triglycerides: 246 mg/dL — ABNORMAL HIGH (ref <150)
VLDL: 49 mg/dL — ABNORMAL HIGH (ref 0–40)

## 2020-11-09 MED ORDER — CARVEDILOL 12.5 MG PO TABS: 12.5000 mg | ORAL_TABLET | Freq: Two times a day (BID) | ORAL | 3 refills | Status: DC

## 2020-11-09 NOTE — Patient Instructions (Signed)
Increase Carvedilol to 12.5 mg Twice daily   Labs done today, we will call you for abnormal results  [facility] will reach out to you regarding your Praluent  Your physician recommends that you schedule a follow-up appointment in: 3 months with an echocardiogram  If you have any questions or concerns before your next appointment please send [place] a message through [place] or call our office at [phone].    TO LEAVE A MESSAGE FOR THE NURSE SELECT OPTION 2, PLEASE LEAVE A MESSAGE INCLUDING: . YOUR NAME . DATE OF BIRTH . CALL BACK NUMBER . REASON FOR CALL**this is important as we prioritize the call backs  YOU WILL RECEIVE A CALL BACK THE SAME DAY AS LONG AS YOU CALL BEFORE 4:00 PM  At the [facility], you and your health needs are our priority. As part of our continuing mission to provide you with exceptional heart care, we have created designated Provider Care Teams. These Care Teams include your primary Cardiologist (physician) and Advanced Practice Providers (APPs- Physician Assistants and Nurse Practitioners) who all work together to provide you with the care you need, when you need it.   You may see any of the following providers on your designated Care Team at your next follow up: Marland Kitchen Dr Arvilla Meres . Dr Marca Ancona . Dr Thornell Mule . Tonye Becket, NP . Robbie Lis, PA . Shanda Bumps [place],NP . Karle Plumber, PharmD   Please be sure to bring in all your medications bottles to every appointment.

## 2020-11-09 NOTE — Progress Notes (Signed)
Pt's disability forms completed and signed by Dr Shirlee Latch during visit and given to pt to return to [place] [employer] [place].

## 2020-11-11 NOTE — Progress Notes (Signed)
PCP: Hal Morales, NP Cardiology: Dr. Antoine Poche HF Cardiology: Dr. Shirlee Latch  65 y.o. with history of CAD, ischemic cardiomyopathy/chronic systolic CHF, OSA, and VT was referred by Dr. Antoine Poche for evaluation of CHF.  Patient has a long history of CAD (see PMH below).  Last cath in 5/20 showed a chronically occluded RCA with collaterals and stable stents in the LCx system, no intervention.  He has had an ischemic cardiomyopathy with EF 20-25% in 5/20, and actually up to 35-40% on 2/21 echo (I reviewed images today).  He has a Medtronic ICD and is on amiodarone for VT. He has significant OSA but has not been able to tolerate CPAP with a facemask.    CPX was done in 3/21, study was submaximal with moderate HF limitation but also functional limitation from pulmonary restriction and exertional hypoxemia.  V/Q scan was done and was not suggestive of chronic PE.  Zio patch in 3/21 showed 2% PVCs.   He was admitted in 5/21 with PNA on a background of interstitial lung disease (looks like UIP on high resolution CT chest from 4/21).  Seen by Dr. Isaiah Serge with pulmonary, thought probably not amiodarone lung toxicity but we stopped amiodarone and put him on mexiletine.  He was on oxygen in the hospital.  He decided not to start Esbriet for pulmonary fibrosis.   He returns for followup of CHF.  He is now using oxygen only at night (unable to tolerate CPAP).  Feels like metformin made him short of breath so he stopped it.  No dyspnea walking on flat ground.  Short of breath lifting heavy objects.  +Bendopnea. Weight down 4 lbs.  No orthopnea/PND.   Medtronic device interrogation: Stable thoracic impedance, no VT.   ECG (personally reviewed): NSR, normal  Labs (9/20): K 4.5, creatinine 1.08 Labs (3/21): hgb 16.9, K 4.5, creatinine 1.3, AST 34, ALT 54 (mildly elevated), LDL 51, TGs 373, TSH normal.  Labs (5/21): K 3.9, creatinine 1.07 Labs (7/21): K 3.9, creatinine 0.94, LDL 46, HDL 38  PMH: 1. CAD:  - PCI to  OM1 in 2012.  - DES to LCx in 5/19.  - LHC (5/20): 50% proximal LAD, 95% proximal D1, patent LCx and OM1 stents, total occlusion of RCA with collaterals.  2. Chronic systolic CHF: Ischemic cardiomyopathy. Medtronic ICD.  - RHC (5/20): mean RA 3, PA 25/5, mean PCWP 6, CI 1.86 - Echo (5/20): EF 20-25% - Echo (2/21): EF 35-40%, mild LV dilation, RV low normal function.  - CPX (3/21): peak VO2 17.6, VE/VCO2 slope 43, RER 0.96 => submaximal, moderate HF limitation but also noted is pulmonary restriction with exertional hypoxemia.  3. VT: Now on amiodarone.  4. OSA: Has not tolerate CPAP.  5. HTN 6. Type 2 diabetes 7. Hyperlipidemia: Has not tolerated statins.  8. PVCs - Zio patch (3/21) with 2% PVCs 9. Interstitial lung disease: high resolution CT chest in 4/21 concerning for usual interstitial pneumonitis  SH: Married, lives in [place], retired, quit smoking in 2007.   Family History  Problem Relation Age of Onset   Lymphoma Mother    Heart attack Father 33   Heart attack Brother 53   ROS: All systems reviewed and negative except as per HPI.   Current Outpatient Medications  Medication Sig Dispense Refill   acetaminophen (TYLENOL) 500 MG tablet Take 500 mg by mouth every 6 (six) hours as needed (for headaches.).     albuterol (VENTOLIN HFA) 108 (90 Base) MCG/ACT inhaler Inhale 1 puff into  the lungs every 6 (six) hours as needed for wheezing or shortness of breath. 8 g 0   aspirin 81 MG chewable tablet Chew 1 tablet (81 mg total) by mouth daily.     CINNAMON PO Take 1,000 mg by mouth daily.     clopidogrel (PLAVIX) 75 MG tablet TAKE 1 TABLET BY MOUTH EVERY DAY 90 tablet 3   Coenzyme Q10 (CO Q 10) 100 MG CAPS Take 1 tablet by mouth daily.     empagliflozin (JARDIANCE) 25 MG TABS tablet Take 25 mg by mouth daily.     ENTRESTO 49-51 MG TAKE 1 TABLET BY MOUTH TWICE DAILY 60 tablet 11   furosemide (LASIX) 40 MG tablet Take 1 tablet (40 mg total) by mouth 2 (two) times daily.  Take 1 tablet daily 180 tablet 3   Insulin Glargine (TOUJEO SOLOSTAR ) Inject 50 Units into the skin at bedtime.     magnesium oxide (MAG-OX) 400 MG tablet Take 400 mg by mouth 2 (two) times daily.     mexiletine (MEXITIL) 150 MG capsule TAKE 1 CAPSULE(150 MG) BY MOUTH EVERY 12 HOURS 180 capsule 0   nitroGLYCERIN (NITROSTAT) 0.4 MG SL tablet Place 1 tablet (0.4 mg total) under the tongue every 5 (five) minutes as needed for chest pain. 30 tablet 1   pantoprazole (PROTONIX) 40 MG tablet Take 40 mg by mouth every evening.      PRALUENT 75 MG/ML SOAJ ADMINISTER 1 ML UNDER THE SKIN EVERY 14 DAYS 2 mL 3   pramipexole (MIRAPEX) 1 MG tablet Take 0.5 mg by mouth 4 (four) times daily.      spironolactone (ALDACTONE) 25 MG tablet Take 1 tablet (25 mg total) by mouth once for 1 dose. Needs doctor appointment for further refill. 90 tablet 0   carvedilol (COREG) 12.5 MG tablet Take 1 tablet (12.5 mg total) by mouth 2 (two) times daily with a meal. 60 tablet 3   No current facility-administered medications for this encounter.   BP 118/78    Pulse 72    Wt 92.4 kg (203 lb 12.8 oz)    SpO2 96%    BMI 29.24 kg/m  General: NAD Neck: No JVD, no thyromegaly or thyroid nodule.  Lungs: Dry crackles CV: Nondisplaced PMI.  Heart regular S1/S2, no S3/S4, no murmur.  No peripheral edema.  No carotid bruit.  Normal pedal pulses.  Abdomen: Soft, nontender, no hepatosplenomegaly, no distention.  Skin: Intact without lesions or rashes.  Neurologic: Alert and oriented x 3.  Psych: Normal affect. Extremities: No clubbing or cyanosis.  HEENT: Normal.   Assessment/Plan: 1. CAD: Cath in 5/20 showed chronically occluded RCA with patent LCx system stents.  No interventional target.  No chest pain.  He does not take statins due to myalgias.  - Continue ASA 81 daily and Plavix 75 mg daily.   - Continue Praluent, check lipids today.   2. Chronic systolic CHF: Ischemic cardiomyopathy.  Medtronic ICD.  Narrow QRS so  not CRT candidate.  Echo in 5/20 with EF 20-25%, but echo in 2/21 looked better, with EF 35-40%.  CPX in 3/21 with moderate HF limitation.  Currently, NYHA class II symptoms.  Not volume overloaded by exam or Optivol.   - Continue Lasix 40 mg bid.  BMET today.   - Continue Entresto 49/51 bid, had to decrease from top dose due to lightheadedness/soft BP.   - Continue spironolactone 25 mg daily.  - Increase Coreg to 12.5 mg bid.   - Continue  empagliflozin.  - Echo at followup in 3 months.  3. OSA: Untreated, sounds severe based on his wife's description.  He has not been able to tolerate CPAP in the past and is not willing to try again.  4. PVCs: History of PVCs, now on mexiletine. Most recent Zio patch in 3/21 with only 2% PVCs.  There was question of amiodarone lung toxicity during 5/21 admission and amiodarone was stopped, but pulmonary consult thought more likely UIP.  5. ILD: Restrictive PFTs noted with CPX.  CT chest in 4/21 concerning for UIP.  I stopped amiodarone due to concern that this could represent amiodarone lung toxicity, but pulmonary thought less likely than UIP.  He saw pulmonary and decided against treatment with Esbriet.   Followup in 3 months with echo.   Marca Ancona 11/11/2020

## 2020-11-12 ENCOUNTER — Telehealth: Payer: Self-pay

## 2020-11-12 NOTE — Telephone Encounter (Signed)
Called and spoke w/pt's wife regarding the praluent and stated that their drug insurance hadn't changed so that they are good to stay w/same med. They voiced understanding

## 2020-11-12 NOTE — Telephone Encounter (Signed)
-----   Message from Awilda Metro, RPH-CPP sent at 11/12/2020  9:51 AM EDT ----- Kk thanks, would you be able to call the pt to let him know? Sounds like he told HF clinic that he got new insurance and needed help getting the med ----- Message ----- From: Eather Colas, CMA Sent: 11/12/2020   9:39 AM EDT To: Awilda Metro, RPH-CPP  Should be abe to because the card they gave me I tried to run pa and says pt has acces to med already meanng no pa required ----- Message ----- From: Awilda Metro, RPH-CPP Sent: 11/12/2020   9:27 AM EDT To: Eather Colas, CMA  Awesome so pharmacy can still fill his Praluent just fine? ----- Message ----- From: Eather Colas, CMA Sent: 11/12/2020   9:16 AM EDT To: Awilda Metro, RPH-CPP  He still has same insurance just added a supplement I called the pharmacy ----- Message ----- From: Awilda Metro, RPH-CPP Sent: 11/09/2020  11:20 AM EDT To: Eather Colas, CMA  Are you able to follow up with pt for Praluent authorization under new insurance? Thanks! ----- Message ----- From: Noralee Space, RN Sent: 11/09/2020  10:44 AM EDT To: Awilda Metro, RPH-CPP  Hey, you guys have him on Praluent but he said his insurance has changed and he needs help getting it, can you please follow up with him, thanks so much

## 2020-11-13 ENCOUNTER — Other Ambulatory Visit (HOSPITAL_COMMUNITY): Payer: Self-pay

## 2020-11-13 MED ORDER — FUROSEMIDE 40 MG PO TABS: 40.0000 mg | ORAL_TABLET | Freq: Two times a day (BID) | ORAL | 3 refills | Status: DC

## 2020-11-15 ENCOUNTER — Telehealth (HOSPITAL_COMMUNITY): Payer: Self-pay | Admitting: Pharmacy Technician

## 2020-11-15 NOTE — Telephone Encounter (Signed)
I received a message regarding this patient's Vascepa co-pay. A test claim showed that a 30 day supply would be $98. There are currently no assistance programs for this medication. I researched an organization, [employer] that helps with finding and delivery medications at a lower cost. Unfortunately, there is no way to offer cost savings to Medicare patients. They would only be able to ship the medication to the patient, while using the co-pay from his current plan.  Sent Philicia (CMA), a message regarding this update.  Archer Asa, CPhT

## 2020-11-16 ENCOUNTER — Telehealth (HOSPITAL_COMMUNITY): Payer: Self-pay

## 2020-11-16 MED ORDER — FENOFIBRATE 48 MG PO TABS: 48.0000 mg | ORAL_TABLET | Freq: Every day | ORAL | 11 refills | Status: DC

## 2020-11-16 NOTE — Telephone Encounter (Signed)
-----   Message from Laurey Morale, MD sent at 11/15/2020  9:19 PM EDT ----- OK can change to fenofibrate as I outlined.  ----- Message ----- From: Chinita Pester, CMA Sent: 11/15/2020  10:48 AM EDT To: Laurey Morale, MD  Per the pharmacy team Due to him having medicare there is no assistance for the [facility]

## 2020-11-16 NOTE — Telephone Encounter (Signed)
Patient advised and verbalized understanding. New Rx sent into patients pharmacy.   Meds ordered this encounter  Medications   fenofibrate (TRICOR) 48 MG tablet    Sig: Take 1 tablet (48 mg total) by mouth daily.    Dispense:  30 tablet    Refill:  11    

## 2020-11-17 NOTE — Progress Notes (Signed)
Electrophysiology Office Note   Date:  11/19/2020   ID:  RONNIE DOO, DOB 07/04/1956, MRN [id number]  PCP:  Hal Morales, NP  Cardiologist:  Antoine Poche Primary Electrophysiologist:  Clydene Burack [name], MD    No chief complaint on file.    History of Present Illness: [name] is a 66 y.o. male who is being seen today for the evaluation of ischemic cardiomyopathy at the request of Angelina Sheriff. Presenting today for electrophysiology evaluation.    He has a history significant for chronic systolic heart failure due to ischemic cardiomyopathy, coronary artery disease, hypertension, OSA, type 2 diabetes.  He is status post Medtronic ICD implanted 07/19/2018.  Today, denies symptoms of palpitations, chest pain, shortness of breath, orthopnea, PND, lower extremity edema, claudication, dizziness, presyncope, syncope, bleeding, or neurologic sequela. The patient is tolerating medications without difficulties.  He currently feels well.  He has no chest pain or shortness of breath.  Is able do all of his daily activities.  He actually feels better than he did a year ago.  He has had no arrhythmias noted on device interrogation.   Past Medical History:  Diagnosis Date  . ASCVD (arteriosclerotic cardiovascular disease)    a. MI 2004, did not seek care at that time. b. s/p PTCA/BMS to OM1 04/2011 (known totally occluded RCA with L-R collaterals and failed angioplasty on that vessel). c.)  Cath 2019 chronically occluded right coronary artery with collateralization.  90% circumflex stenosis treated with a DES.  . CHF (congestive heart failure) (HCC)   . Chronic bronchitis (HCC)   . HTN (hypertension)   . Hyperlipidemia   . Ischemic cardiomyopathy    EF 30%.    . OSA (obstructive sleep apnea) 05/24/11  . Restless leg syndrome 06/25/2011  . Type 2 diabetes mellitus (HCC)    Past Surgical History:  Procedure Laterality Date  . CARDIAC CATHETERIZATION  11/24/2012   40-50% ostial LAD,  40% mid LAD, 60-70% ostial D1, 40% proximal LCx, patent OM branch stent, 99% serial proximal-mid RCA lesions, 100% mid RCA occlusion (chronic with left to right collateralization); EF 40%, inferior wall hypokinesis.  Marland Kitchen CARDIAC CATHETERIZATION N/A 05/31/2015   Procedure: Left Heart Cath and Coronary Angiography;  Surgeon: Kathleene Hazel, MD;  Location: [facility] INVASIVE CV LAB;  Service: Cardiovascular;  Laterality: N/A;  . CORONARY STENT INTERVENTION N/A 12/25/2017   Procedure: CORONARY STENT INTERVENTION;  Surgeon: Swazil[place], Peter M, MD;  Location: [facility] INVASIVE CV LAB;  Service: Cardiovascular;  Laterality: N/A;  . CORONARY STENT PLACEMENT  04/2011  . CYSTECTOMY     Spine and jaw  . ICD IMPLANT N/A 07/19/2018   Procedure: ICD IMPLANT;  Surgeon: Regan Lemming, MD;  Location: [facility] INVASIVE CV LAB;  Service: Cardiovascular;  Laterality: N/A;  . LEFT HEART CATH AND CORONARY ANGIOGRAPHY N/A 12/25/2017   Procedure: LEFT HEART CATH AND CORONARY ANGIOGRAPHY;  Surgeon: [place]azil[place], Peter M, MD;  Location: [facility] INVASIVE CV LAB;  Service: Cardiovascular;  Laterality: N/A;  . LEFT HEART CATHETERIZATION WITH CORONARY ANGIOGRAM N/A 11/24/2012   Procedure: LEFT HEART CATHETERIZATION WITH CORONARY ANGIOGRAM;  Surgeon: Kathleene Hazel, MD;  Location: [facility] CATH LAB;  Service: Cardiovascular;  Laterality: N/A;  . lesion removed from foot     right foot  . RIGHT/LEFT HEART CATH AND CORONARY ANGIOGRAPHY N/A 01/06/2019   Procedure: RIGHT/LEFT HEART CATH AND CORONARY ANGIOGRAPHY;  Surgeon: Runell Gess, MD;  Location: [facility] INVASIVE CV LAB;  Service: Cardiovascular;  Laterality: N/A;  .  TONSILLECTOMY AND ADENOIDECTOMY       Current Outpatient Medications  Medication Sig Dispense Refill  . acetaminophen (TYLENOL) 500 MG tablet Take 500 mg by mouth every 6 (six) hours as needed (for headaches.).    Marland Kitchen albuterol (VENTOLIN HFA) 108 (90 Base) MCG/ACT inhaler Inhale 1 puff into the lungs every 6 (six) hours as needed for  wheezing or shortness of breath. 8 g 0  . aspirin 81 MG chewable tablet Chew 1 tablet (81 mg total) by mouth daily.    . carvedilol (COREG) 12.5 MG tablet Take 1 tablet (12.5 mg total) by mouth 2 (two) times daily with a meal. 60 tablet 3  . CINNAMON PO Take 1,000 mg by mouth daily.    . Coenzyme Q10 (CO Q 10) 100 MG CAPS Take 1 tablet by mouth daily.    . empagliflozin (JARDIANCE) 25 MG TABS tablet Take 25 mg by mouth daily.    Marland Kitchen ENTRESTO 49-51 MG TAKE 1 TABLET BY MOUTH TWICE DAILY 60 tablet 11  . fenofibrate (TRICOR) 48 MG tablet Take 1 tablet (48 mg total) by mouth daily. 30 tablet 11  . furosemide (LASIX) 40 MG tablet Take 1 tablet (40 mg total) by mouth 2 (two) times daily. Take 1 tablet daily 180 tablet 3  . Insulin Glargine (TOUJEO SOLOSTAR [place]) Inject 50 Units into the skin at bedtime.    . magnesium oxide (MAG-OX) 400 MG tablet Take 400 mg by mouth 2 (two) times daily.    Marland Kitchen mexiletine (MEXITIL) 150 MG capsule TAKE 1 CAPSULE(150 MG) BY MOUTH EVERY 12 HOURS 180 capsule 0  . nitroGLYCERIN (NITROSTAT) 0.4 MG SL tablet Place 1 tablet (0.4 mg total) under the tongue every 5 (five) minutes as needed for chest pain. 30 tablet 1  . NOVOLOG FLEXPEN 100 UNIT/ML FlexPen Inject 5 Units into the skin 3 (three) times daily.    . pantoprazole (PROTONIX) 40 MG tablet Take 40 mg by mouth every evening.     Marland Kitchen PRALUENT 75 MG/ML SOAJ ADMINISTER 1 ML UNDER THE SKIN EVERY 14 DAYS 2 mL 3  . pramipexole (MIRAPEX) 1 MG tablet Take 0.5 mg by mouth 4 (four) times daily.     . clopidogrel (PLAVIX) 75 MG tablet Take 1 tablet (75 mg total) by mouth daily. 90 tablet 3  . spironolactone (ALDACTONE) 25 MG tablet Take 1 tablet (25 mg total) by mouth once for 1 dose. Needs doctor appointment for further refill. 90 tablet 0   No current facility-administered medications for this visit.    Allergies:   Metformin and related, Brilinta [ticagrelor], Codeine, Lipitor [atorvastatin calcium], and Niaspan [niacin er]   Social  History:  The patient  reports that he quit smoking about 15 years ago. His smoking use included cigarettes. He has a 52.50 pack-year smoking history. He has never used smokeless tobacco. He reports that he does not drink alcohol and does not use drugs.   Family History:  The patient's family history includes Heart attack (age of onset: 74) in his brother; Heart attack (age of onset: 51) in his father; Lymphoma in his mother.   ROS:  Please see the history of present illness.   Otherwise, review of systems is positive for none.   All other systems are reviewed and negative.   PHYSICAL EXAM: VS:  BP 104/68   Pulse 67   Ht 5\' 10"  (1.778 m)   Wt 209 lb 3.2 oz (94.9 kg)   SpO2 95%   BMI 30.02 kg/m  ,  BMI Body mass index is 30.02 kg/m. GEN: Well nourished, well developed, in no acute distress  HEENT: normal  Neck: no JVD, carotid bruits, or masses Cardiac: RRR; no murmurs, rubs, or gallops,no edema  Respiratory:  clear to auscultation bilaterally, normal work of breathing GI: soft, nontender, nondistended, + BS MS: no deformity or atrophy  Skin: warm and dry, device site well healed Neuro:  Strength and sensation are intact Psych: euthymic mood, full affect  EKG:  EKG is ordered today. Personal review of the ekg ordered shows sinus rhythm  Personal review of the device interrogation today. Results in [facility]   Recent Labs: 12/23/2019: Magnesium 2.0 12/25/2019: Hemoglobin 14.1; Platelets 184 11/09/2020: BUN 18; Creatinine, Ser 1.00; Potassium 4.1; Sodium 136    Lipid Panel     Component Value Date/Time   CHOL 129 11/09/2020 1059   TRIG 246 (H) 11/09/2020 1059   HDL 35 (L) 11/09/2020 1059   CHOLHDL 3.7 11/09/2020 1059   VLDL 49 (H) 11/09/2020 1059   LDLCALC 45 11/09/2020 1059     Wt Readings from Last 3 Encounters:  11/19/20 209 lb 3.2 oz (94.9 kg)  11/09/20 203 lb 12.8 oz (92.4 kg)  03/07/20 207 lb 9.6 oz (94.2 kg)      Other studies Reviewed: Additional studies/ records  that were reviewed today include: TTE 03/15/18  Review of the above records today demonstrates:  - Left ventricle: The cavity size was mildly dilated. Wall   thickness was normal. Systolic function was severely reduced. The   estimated ejection fraction was in the range of 25% to 30%.   Diffuse hypokinesis. There is akinesis of the inferolateral   myocardium. Doppler parameters are consistent with abnormal left   ventricular relaxation (grade 1 diastolic dysfunction). - Right ventricle: The cavity size was mildly dilated. - Right atrium: The atrium was mildly dilated.  LHC 12/25/17  Prox RCA lesion is 99% stenosed.  Prox RCA to Mid RCA lesion is 90% stenosed.  Mid RCA lesion is 100% stenosed.  Prox LAD lesion is 40% stenosed.  Ost 1st Diag lesion is 90% stenosed.  Prox LAD to Mid LAD lesion is 10% stenosed.  Previously placed Ost 1st Mrg to 1st Mrg stent (unknown type) is widely patent.  Ost Cx to Prox Cx lesion is 90% stenosed.  Post intervention, there is a 0% residual stenosis.  A drug-eluting stent was successfully placed using a STENT SYNERGY DES 3.5X12.  Non-stenotic Prox Cx to Mid Cx lesion.  LV end diastolic pressure is normal.   ASSESSMENT AND PLAN:  1.  Coronary artery disease: Status post multiple stents placed.  No current chest pain.  Continue with current management.    2.  Ischemic cardiomyopathy: Currently on optimal medical therapy.  Status post Medtronic ICD implanted 09/29/2017.  Device functioning appropriately.  No changes at this time.   3.  Hypertension: well controlled  4.  Ventricular tachycardia: Currently on mexiletine.  High risk medication monitoring.  No further episodes.  Continue with current management.  Current medicines are reviewed at length with the patient today.   The patient does not have concerns regarding his medicines.  The following changes were made today: none  Labs/ tests ordered today include:  No orders of the defined  types were placed in this encounter.   Disposition:   FU with Jeyda Siebel 12 months  Signed, Dezaree Tracey [name], MD  11/19/2020 11:12 AM     [facility] [address] [employer]  [place] [address] [phone] (office) [phone] (fax)

## 2020-11-19 ENCOUNTER — Other Ambulatory Visit: Payer: Self-pay

## 2020-11-19 ENCOUNTER — Ambulatory Visit (INDEPENDENT_AMBULATORY_CARE_PROVIDER_SITE_OTHER): Payer: Medicare Other | Admitting: Cardiology

## 2020-11-19 ENCOUNTER — Encounter: Payer: Self-pay | Admitting: Cardiology

## 2020-11-19 VITALS — BP 104/68 | HR 67 | Ht 70.0 in | Wt 209.2 lb

## 2020-11-19 DIAGNOSIS — I255 Ischemic cardiomyopathy: Secondary | ICD-10-CM | POA: Diagnosis not present

## 2020-11-19 MED ORDER — CLOPIDOGREL BISULFATE 75 MG PO TABS: 1.0000 | ORAL_TABLET | Freq: Every day | ORAL | 3 refills | Status: DC

## 2020-11-19 NOTE — Patient Instructions (Signed)
Medication Instructions:  Your physician recommends that you continue on your current medications as directed. Please refer to the Current Medication list given to you today.  *If you need a refill on your cardiac medications before your next appointment, please call your pharmacy*   Lab Work: None ordered   Testing/Procedures: None ordered   Follow-Up: At [facility], you and your health needs are our priority.  As part of our continuing mission to provide you with exceptional heart care, we have created designated Provider Care Teams.  These Care Teams include your primary Cardiologist (physician) and Advanced Practice Providers (APPs -  Physician Assistants and Nurse Practitioners) who all work together to provide you with the care you need, when you need it.  Remote monitoring is used to monitor your Pacemaker or ICD from home. This monitoring reduces the number of office visits required to check your device to one time per year. It allows [place] to keep an eye on the functioning of your device to ensure it is working properly. You are scheduled for a device check from home on 01/22/2021. You may send your transmission at any time that day. If you have a wireless device, the transmission will be sent automatically. After your physician reviews your transmission, you will receive a postcard with your next transmission date.  Your next appointment:   1 year(s)  The format for your next appointment:   In Person  Provider:   Loman Brooklyn, MD   Thank you for choosing [facility]!!   Dory Horn, RN [phone]    Other Instructions

## 2021-01-05 ENCOUNTER — Other Ambulatory Visit (HOSPITAL_COMMUNITY): Payer: Self-pay | Admitting: Cardiology

## 2021-01-21 ENCOUNTER — Ambulatory Visit (HOSPITAL_BASED_OUTPATIENT_CLINIC_OR_DEPARTMENT_OTHER)
Admission: RE | Admit: 2021-01-21 | Discharge: 2021-01-21 | Disposition: A | Discharge status: Home / Self Care | Payer: Medicare Other | Source: Ambulatory Visit | Attending: Cardiology | Admitting: Cardiology

## 2021-01-21 ENCOUNTER — Other Ambulatory Visit: Payer: Self-pay

## 2021-01-21 ENCOUNTER — Ambulatory Visit (HOSPITAL_COMMUNITY)
Admission: RE | Admit: 2021-01-21 | Discharge: 2021-01-21 | Disposition: A | Discharge status: Home / Self Care | Payer: Medicare Other | Source: Ambulatory Visit | Attending: Cardiology | Admitting: Cardiology

## 2021-01-21 ENCOUNTER — Other Ambulatory Visit (HOSPITAL_COMMUNITY): Payer: Self-pay

## 2021-01-21 VITALS — BP 108/64 | HR 61 | Ht 70.0 in | Wt 205.2 lb

## 2021-01-21 DIAGNOSIS — Z87891 Personal history of nicotine dependence: Secondary | ICD-10-CM | POA: Insufficient documentation

## 2021-01-21 DIAGNOSIS — J849 Interstitial pulmonary disease, unspecified: Secondary | ICD-10-CM | POA: Insufficient documentation

## 2021-01-21 DIAGNOSIS — I110 Hypertensive heart disease with heart failure: Secondary | ICD-10-CM | POA: Insufficient documentation

## 2021-01-21 DIAGNOSIS — I5022 Chronic systolic (congestive) heart failure: Secondary | ICD-10-CM | POA: Insufficient documentation

## 2021-01-21 DIAGNOSIS — I255 Ischemic cardiomyopathy: Secondary | ICD-10-CM | POA: Insufficient documentation

## 2021-01-21 DIAGNOSIS — E119 Type 2 diabetes mellitus without complications: Secondary | ICD-10-CM | POA: Insufficient documentation

## 2021-01-21 DIAGNOSIS — Z794 Long term (current) use of insulin: Secondary | ICD-10-CM | POA: Diagnosis not present

## 2021-01-21 DIAGNOSIS — Z7982 Long term (current) use of aspirin: Secondary | ICD-10-CM | POA: Diagnosis not present

## 2021-01-21 DIAGNOSIS — I2510 Atherosclerotic heart disease of native coronary artery without angina pectoris: Secondary | ICD-10-CM | POA: Diagnosis not present

## 2021-01-21 DIAGNOSIS — Z7902 Long term (current) use of antithrombotics/antiplatelets: Secondary | ICD-10-CM | POA: Insufficient documentation

## 2021-01-21 DIAGNOSIS — Z8249 Family history of ischemic heart disease and other diseases of the circulatory system: Secondary | ICD-10-CM | POA: Diagnosis not present

## 2021-01-21 DIAGNOSIS — G4733 Obstructive sleep apnea (adult) (pediatric): Secondary | ICD-10-CM | POA: Insufficient documentation

## 2021-01-21 DIAGNOSIS — Z955 Presence of coronary angioplasty implant and graft: Secondary | ICD-10-CM | POA: Diagnosis not present

## 2021-01-21 DIAGNOSIS — Z79899 Other long term (current) drug therapy: Secondary | ICD-10-CM | POA: Insufficient documentation

## 2021-01-21 LAB — BASIC METABOLIC PANEL
Anion gap: 10 (ref 5–15)
BUN: 26 mg/dL — ABNORMAL HIGH (ref 8–23)
CO2: 23 mmol/L (ref 22–32)
Calcium: 9.1 mg/dL (ref 8.9–10.3)
Chloride: 101 mmol/L (ref 98–111)
Creatinine, Ser: 1 mg/dL (ref 0.61–1.24)
GFR, Estimated: >60 mL/min (ref >60)
Glucose, Bld: 128 mg/dL — ABNORMAL HIGH (ref 70–99)
Potassium: 4 mmol/L (ref 3.5–5.1)
Sodium: 134 mmol/L — ABNORMAL LOW (ref 135–145)

## 2021-01-21 LAB — ECHOCARDIOGRAM COMPLETE
Area-P 1/2: 4.21 cm2
Calc EF: 29.9 %
S' Lateral: 4.6 cm
Single Plane A2C EF: 27.5 %
Single Plane A4C EF: 38.8 %

## 2021-01-21 MED ORDER — FUROSEMIDE 40 MG PO TABS: ORAL_TABLET | ORAL | 3 refills | Status: DC

## 2021-01-21 MED ORDER — ENTRESTO 97-103 MG PO TABS: 1.0000 | ORAL_TABLET | Freq: Two times a day (BID) | ORAL | 6 refills | Status: DC

## 2021-01-21 NOTE — Progress Notes (Signed)
  Echocardiogram 2D Echocardiogram has been performed.  [name] 01/21/2021, 11:51 AM

## 2021-01-21 NOTE — Progress Notes (Signed)
PCP: Hal Morales, NP Cardiology: Dr. Antoine Poche HF Cardiology: Dr. Shirlee Latch  65 y.o. with history of CAD, ischemic cardiomyopathy/chronic systolic CHF, OSA, and VT was referred by Dr. Antoine Poche for evaluation of CHF.  Patient has a long history of CAD (see PMH below).  Last cath in 5/20 showed a chronically occluded RCA with collaterals and stable stents in the LCx system, no intervention.  He has had an ischemic cardiomyopathy with EF 20-25% in 5/20, and actually up to 35-40% on 2/21 echo (I reviewed images today).  He has a Medtronic ICD and is on amiodarone for VT. He has significant OSA but has not been able to tolerate CPAP with a facemask.    CPX was done in 3/21, study was submaximal with moderate HF limitation but also functional limitation from pulmonary restriction and exertional hypoxemia.  V/Q scan was done and was not suggestive of chronic PE.  Zio patch in 3/21 showed 2% PVCs.   He was admitted in 5/21 with PNA on a background of interstitial lung disease (looks like UIP on high resolution CT chest from 4/21).  Seen by Dr. Isaiah Serge with pulmonary, thought probably not amiodarone lung toxicity but we stopped amiodarone and put him on mexiletine.  He was on oxygen in the hospital.  He decided not to start Esbriet for pulmonary fibrosis.   Echo was done today and reviewed, EF 35-40% with inferior and inferolateral hypokinesis, mild RV enlargement, mildly decreased RV systolic function.   He returns for followup of CHF.  Weight is up 2 lbs.  He gets short of breath lifting a heavy load, no dyspnea walking on flat ground.  He gets tired/sleepy during the day, has untreated OSA.  No orthopnea/PND.  No lightheadedness.  No chest pain.    Medtronic device interrogation: Stable thoracic impedance, no AF or VT.   ECG (personally reviewed): NSR, LAFB, PVCs   Labs (9/20): K 4.5, creatinine 1.08 Labs (3/21): hgb 16.9, K 4.5, creatinine 1.3, AST 34, ALT 54 (mildly elevated), LDL 51, TGs 373, TSH  normal.  Labs (5/21): K 3.9, creatinine 1.07 Labs (7/21): K 3.9, creatinine 0.94, LDL 46, HDL 38 Labs (3/22): K 4.1, creatinine 1.0, LDL 45, HDL 35  PMH: 1. CAD:  - PCI to OM1 in 2012.  - DES to LCx in 5/19.  - LHC (5/20): 50% proximal LAD, 95% proximal D1, patent LCx and OM1 stents, total occlusion of RCA with collaterals.  2. Chronic systolic CHF: Ischemic cardiomyopathy. Medtronic ICD.  - RHC (5/20): mean RA 3, PA 25/5, mean PCWP 6, CI 1.86 - Echo (5/20): EF 20-25% - Echo (2/21): EF 35-40%, mild LV dilation, RV low normal function.  - CPX (3/21): peak VO2 17.6, VE/VCO2 slope 43, RER 0.96 => submaximal, moderate HF limitation but also noted is pulmonary restriction with exertional hypoxemia.  - Echo (6/22): EF 35-40% with inferior and inferolateral hypokinesis, mild RV enlargement, mildly decreased RV systolic function. 3. VT: Now on amiodarone.  4. OSA: Has not tolerate CPAP.  5. HTN 6. Type 2 diabetes 7. Hyperlipidemia: Has not tolerated statins.  8. PVCs - Zio patch (3/21) with 2% PVCs 9. Interstitial lung disease: high resolution CT chest in 4/21 concerning for usual interstitial pneumonitis  SH: Married, lives in [place], retired, quit smoking in 2007.   Family History  Problem Relation Age of Onset  . Lymphoma Mother   . Heart attack Father 57  . Heart attack Brother 51   ROS: All systems reviewed and negative except  as per HPI.   Current Outpatient Medications  Medication Sig Dispense Refill  . acetaminophen (TYLENOL) 500 MG tablet Take 500 mg by mouth every 6 (six) hours as needed (for headaches.).    Marland Kitchen albuterol (VENTOLIN HFA) 108 (90 Base) MCG/ACT inhaler Inhale 1 puff into the lungs every 6 (six) hours as needed for wheezing or shortness of breath. 8 g 0  . aspirin 81 MG chewable tablet Chew 1 tablet (81 mg total) by mouth daily.    . BD PEN NEEDLE NANO 2ND GEN 32G X 4 MM MISC See admin instructions.    . carvedilol (COREG) 12.5 MG tablet Take 1 tablet (12.5 mg  total) by mouth 2 (two) times daily with a meal. 60 tablet 3  . CINNAMON PO Take 1,000 mg by mouth 2 (two) times daily.    . clopidogrel (PLAVIX) 75 MG tablet Take 1 tablet (75 mg total) by mouth daily. 90 tablet 3  . Coenzyme Q10 (CO Q 10) 100 MG CAPS Take 1 tablet by mouth daily.    . empagliflozin (JARDIANCE) 25 MG TABS tablet Take 25 mg by mouth daily.    . Insulin Glargine (TOUJEO SOLOSTAR [place]) Inject 50 Units into the skin at bedtime.    . magnesium oxide (MAG-OX) 400 MG tablet Take 400 mg by mouth 2 (two) times daily.    Marland Kitchen mexiletine (MEXITIL) 150 MG capsule TAKE 1 CAPSULE(150 MG) BY MOUTH EVERY 12 HOURS 180 capsule 0  . nitroGLYCERIN (NITROSTAT) 0.4 MG SL tablet Place 1 tablet (0.4 mg total) under the tongue every 5 (five) minutes as needed for chest pain. 30 tablet 1  . NOVOLOG FLEXPEN 100 UNIT/ML FlexPen Inject 5 Units into the skin 3 (three) times daily.    . pantoprazole (PROTONIX) 40 MG tablet Take 40 mg by mouth every evening.     Marland Kitchen PRALUENT 75 MG/ML SOAJ ADMINISTER 1 ML UNDER THE SKIN EVERY 14 DAYS 2 mL 3  . pramipexole (MIRAPEX) 1 MG tablet Take 0.5 mg by mouth 4 (four) times daily.     . sacubitril-valsartan (ENTRESTO) 97-103 MG Take 1 tablet by mouth 2 (two) times daily. 60 tablet 6  . spironolactone (ALDACTONE) 25 MG tablet TAKE 1 TABLET(25 MG) BY MOUTH 1 TIME FOR 1 DOSE 90 tablet 0  . furosemide (LASIX) 40 MG tablet Take 1 tablet (40 mg total) by mouth in the morning AND 0.5 tablets (20 mg total) every evening. Take 1 tablet daily. 180 tablet 3   No current facility-administered medications for this encounter.   BP 108/64   Pulse 61   Ht 5\' 10"  (1.778 m)   Wt 93.1 kg (205 lb 3.2 oz)   BMI 29.44 kg/m  General: NAD Neck: No JVD, no thyromegaly or thyroid nodule.  Lungs: Clear to auscultation bilaterally with normal respiratory effort. CV: Nondisplaced PMI.  Heart regular S1/S2, no S3/S4, no murmur.  No peripheral edema.  No carotid bruit.  Normal pedal pulses.  Abdomen:  Soft, nontender, no hepatosplenomegaly, no distention.  Skin: Intact without lesions or rashes.  Neurologic: Alert and oriented x 3.  Psych: Normal affect. Extremities: No clubbing or cyanosis.  HEENT: Normal.   Assessment/Plan: 1. CAD: Cath in 5/20 showed chronically occluded RCA with patent LCx system stents.  No interventional target.  No chest pain.  He does not take statins due to myalgias.  - Continue ASA 81 daily and Plavix 75 mg daily.   - Continue Praluent, good lipids in 3/22.   2. Chronic  systolic CHF: Ischemic cardiomyopathy.  Medtronic ICD.  Narrow QRS so not CRT candidate.  Echo in 5/20 with EF 20-25%, but echo in 2/21 looked better, with EF 35-40%.  CPX in 3/21 with moderate HF limitation.  Echo done today showed stable EF 35-40%.  Currently, NYHA class II symptoms.  Not volume overloaded by exam or Optivol.     - Increase Entresto to 97/103 bid. Decrease Lasix to 40 qam/20 qpm.  BMET today and 10 days.    - Continue spironolactone 25 mg daily.  - Continue Coreg 12.5 mg bid.   - Continue empagliflozin.  3. OSA: Untreated, sounds severe based on his wife's description.  He has not been able to tolerate CPAP in the past and is not willing to try again. I suspect this is a major reason why he is tired/sleepy during the day.  4. PVCs: History of PVCs, now on mexiletine. Most recent Zio patch in 3/21 with only 2% PVCs.  There was question of amiodarone lung toxicity during 5/21 admission and amiodarone was stopped, but pulmonary consult thought more likely UIP.  5. ILD: Restrictive PFTs noted with CPX.  CT chest in 4/21 concerning for UIP.  I stopped amiodarone due to concern that this could represent amiodarone lung toxicity, but pulmonary thought less likely than UIP.  He saw pulmonary and decided against treatment with Esbriet.   Followup in 3 months with NP/PA.  Needs pulmonary followup.   Marca Ancona 01/21/2021

## 2021-01-21 NOTE — Patient Instructions (Signed)
Increase Entresto to 97/103 mg Twice daily   Decrease Furosemide to 40 mg (1 tab) in AM and 20 mg (1/2 tab) in PM  Labs done today, we will call you for abnormal results  Your physician recommends that you schedule a follow-up appointment in: 3 months  If you have any questions or concerns before your next appointment please send [place] a message through [place] or call our office at [phone].    TO LEAVE A MESSAGE FOR THE NURSE SELECT OPTION 2, PLEASE LEAVE A MESSAGE INCLUDING: . YOUR NAME . DATE OF BIRTH . CALL BACK NUMBER . REASON FOR CALL**this is important as we prioritize the call backs  YOU WILL RECEIVE A CALL BACK THE SAME DAY AS LONG AS YOU CALL BEFORE 4:00 PM  At the [facility], you and your health needs are our priority. As part of our continuing mission to provide you with exceptional heart care, we have created designated Provider Care Teams. These Care Teams include your primary Cardiologist (physician) and Advanced Practice Providers (APPs- Physician Assistants and Nurse Practitioners) who all work together to provide you with the care you need, when you need it.   You may see any of the following providers on your designated Care Team at your next follow up: Marland Kitchen Dr Arvilla Meres . Dr Marca Ancona . Dr Thornell Mule . Tonye Becket, NP . Robbie Lis, PA . Shanda Bumps [place],NP . Karle Plumber, PharmD   Please be sure to bring in all your medications bottles to every appointment.

## 2021-01-22 ENCOUNTER — Ambulatory Visit (INDEPENDENT_AMBULATORY_CARE_PROVIDER_SITE_OTHER): Payer: Medicare Other

## 2021-01-22 DIAGNOSIS — I255 Ischemic cardiomyopathy: Secondary | ICD-10-CM | POA: Diagnosis not present

## 2021-01-22 LAB — CUP PACEART REMOTE DEVICE CHECK
Battery Remaining Longevity: 116 mo
Battery Voltage: 3.01 V
Brady Statistic RV Percent Paced: 0.77 %
Date Time Interrogation Session: 20220607043723
HighPow Impedance: 75 Ohm
Implantable Lead Implant Date: 20191202
Implantable Lead Location: 753860
Implantable Pulse Generator Implant Date: 20191202
Lead Channel Impedance Value: 285 Ohm
Lead Channel Impedance Value: 342 Ohm
Lead Channel Pacing Threshold Amplitude: 0.875 V
Lead Channel Pacing Threshold Pulse Width: 0.4 ms
Lead Channel Sensing Intrinsic Amplitude: 2.5 mV
Lead Channel Sensing Intrinsic Amplitude: 2.5 mV
Lead Channel Setting Pacing Amplitude: 2.5 V
Lead Channel Setting Pacing Pulse Width: 0.4 ms
Lead Channel Setting Sensing Sensitivity: 0.3 mV

## 2021-02-13 NOTE — Progress Notes (Signed)
Remote ICD transmission.   

## 2021-02-14 ENCOUNTER — Telehealth (HOSPITAL_COMMUNITY): Payer: Self-pay | Admitting: *Deleted

## 2021-02-14 MED ORDER — ENTRESTO 49-51 MG PO TABS: 1.0000 | ORAL_TABLET | Freq: Two times a day (BID) | ORAL | 3 refills | Status: DC

## 2021-02-14 NOTE — Telephone Encounter (Signed)
Pt called stating his bp was 94/64 and hes been dizzy since increasing entresto to 97/103mg  bid. Pt asked if it was ok to decrease medication.   Routed to Amy Clegg,NP for advice

## 2021-02-14 NOTE — Telephone Encounter (Signed)
Pt aware and agreeable with plan.  

## 2021-03-02 ENCOUNTER — Other Ambulatory Visit: Payer: Self-pay | Admitting: Cardiology

## 2021-04-08 ENCOUNTER — Other Ambulatory Visit (HOSPITAL_COMMUNITY): Payer: Self-pay | Admitting: Cardiology

## 2021-04-23 ENCOUNTER — Ambulatory Visit (INDEPENDENT_AMBULATORY_CARE_PROVIDER_SITE_OTHER): Payer: Medicare Other

## 2021-04-23 DIAGNOSIS — I255 Ischemic cardiomyopathy: Secondary | ICD-10-CM

## 2021-04-23 LAB — CUP PACEART REMOTE DEVICE CHECK
Battery Remaining Longevity: 113 mo
Battery Voltage: 3.01 V
Brady Statistic RV Percent Paced: 0.39 %
Date Time Interrogation Session: 20220906002203
HighPow Impedance: 84 Ohm
Implantable Lead Implant Date: 20191202
Implantable Lead Location: 753860
Implantable Pulse Generator Implant Date: 20191202
Lead Channel Impedance Value: 285 Ohm
Lead Channel Impedance Value: 399 Ohm
Lead Channel Pacing Threshold Amplitude: 0.875 V
Lead Channel Pacing Threshold Pulse Width: 0.4 ms
Lead Channel Sensing Intrinsic Amplitude: 2.375 mV
Lead Channel Sensing Intrinsic Amplitude: 2.375 mV
Lead Channel Setting Pacing Amplitude: 2.5 V
Lead Channel Setting Pacing Pulse Width: 0.4 ms
Lead Channel Setting Sensing Sensitivity: 0.3 mV

## 2021-04-28 NOTE — Progress Notes (Signed)
PCP: Hal Morales, NP Cardiology: Dr. Antoine Poche HF Cardiology: Dr. Shirlee Latch  65 y.o. with history of CAD, ischemic cardiomyopathy/chronic systolic CHF, OSA, and VT was referred by Dr. Antoine Poche for evaluation of CHF.  Patient has a long history of CAD (see PMH below).  Last cath in 5/20 showed a chronically occluded RCA with collaterals and stable stents in the LCx system, no intervention.  He has had an ischemic cardiomyopathy with EF 20-25% in 5/20, and actually up to 35-40% on 2/21 echo (I reviewed images today).  He has a Medtronic ICD and is on amiodarone for VT. He has significant OSA but has not been able to tolerate CPAP with a facemask.    CPX was done in 3/21, study was submaximal with moderate HF limitation but also functional limitation from pulmonary restriction and exertional hypoxemia.  V/Q scan was done and was not suggestive of chronic PE.  Zio patch in 3/21 showed 2% PVCs.   He was admitted in 5/21 with PNA on a background of interstitial lung disease (looks like UIP on high resolution CT chest from 4/21).  Seen by Dr. Isaiah Serge with pulmonary, thought probably not amiodarone lung toxicity but we stopped amiodarone and put him on mexiletine.  He was on oxygen in the hospital.  He decided not to start Esbriet for pulmonary fibrosis.   Echo 6/22 EF 35-40% with inferior and inferolateral hypokinesis, mild RV enlargement, mildly decreased RV systolic function.   He returns for followup of CHF.  Weight is up 2 lbs.  He gets short of breath lifting a heavy load, no dyspnea walking on flat ground.  He gets tired/sleepy during the day, has untreated OSA.  No orthopnea/PND.  No lightheadedness.  No chest pain.    Today he returns for HF follow up with his wife. He is SOB lifting or walking up stairs, but no significant dyspnea walking on flat ground. Denies CP, dizziness, edema, or PND/Orthopnea. Appetite ok. No fever or chills. Weight at home 192-197 pounds. Taking all medications. He does not  wear CPAP, he says he does not sleep well.  Medtronic device interrogation: Stable thoracic impedance, OptiVol down, no AF or VT (personally reviewed)..   ECG (personally reviewed): none ordered today.  Labs (9/20): K 4.5, creatinine 1.08 Labs (3/21): hgb 16.9, K 4.5, creatinine 1.3, AST 34, ALT 54 (mildly elevated), LDL 51, TGs 373, TSH normal.  Labs (5/21): K 3.9, creatinine 1.07 Labs (7/21): K 3.9, creatinine 0.94, LDL 46, HDL 38 Labs (3/22): K 4.1, creatinine 1.0, LDL 45, HDL 35 Labs (6/22): K 4.0, creatinine 1.0  PMH: 1. CAD:  - PCI to OM1 in 2012.  - DES to LCx in 5/19.  - LHC (5/20): 50% proximal LAD, 95% proximal D1, patent LCx and OM1 stents, total occlusion of RCA with collaterals.  2. Chronic systolic CHF: Ischemic cardiomyopathy. Medtronic ICD.  - RHC (5/20): mean RA 3, PA 25/5, mean PCWP 6, CI 1.86 - Echo (5/20): EF 20-25% - Echo (2/21): EF 35-40%, mild LV dilation, RV low normal function.  - CPX (3/21): peak VO2 17.6, VE/VCO2 slope 43, RER 0.96 => submaximal, moderate HF limitation but also noted is pulmonary restriction with exertional hypoxemia.  - Echo (6/22): EF 35-40% with inferior and inferolateral hypokinesis, mild RV enlargement, mildly decreased RV systolic function. 3. VT: Now on amiodarone.  4. OSA: Has not tolerate CPAP.  5. HTN 6. Type 2 diabetes 7. Hyperlipidemia: Has not tolerated statins.  8. PVCs - Zio patch (3/21) with  2% PVCs 9. Interstitial lung disease: high resolution CT chest in 4/21 concerning for usual interstitial pneumonitis  SH: Married, lives in [place], retired, quit smoking in 2007.   Family History  Problem Relation Age of Onset   Lymphoma Mother    Heart attack Father 63   Heart attack Brother 89   ROS: All systems reviewed and negative except as per HPI.   Current Outpatient Medications  Medication Sig Dispense Refill   acetaminophen (TYLENOL) 500 MG tablet Take 500 mg by mouth every 6 (six) hours as needed (for headaches.).      albuterol (VENTOLIN HFA) 108 (90 Base) MCG/ACT inhaler Inhale 1 puff into the lungs every 6 (six) hours as needed for wheezing or shortness of breath. 8 g 0   aspirin 81 MG chewable tablet Chew 1 tablet (81 mg total) by mouth daily.     BD PEN NEEDLE NANO 2ND GEN 32G X 4 MM MISC See admin instructions.     carvedilol (COREG) 12.5 MG tablet TAKE 1 TABLET(12.5 MG) BY MOUTH TWICE DAILY WITH A MEAL 60 tablet 3   CINNAMON PO Take 1,000 mg by mouth 2 (two) times daily.     clopidogrel (PLAVIX) 75 MG tablet Take 1 tablet (75 mg total) by mouth daily. 90 tablet 3   Coenzyme Q10 (CO Q 10) 100 MG CAPS Take 1 tablet by mouth daily.     empagliflozin (JARDIANCE) 25 MG TABS tablet Take 25 mg by mouth daily.     furosemide (LASIX) 40 MG tablet Take 40 mg by mouth 2 (two) times daily.     Insulin Glargine (TOUJEO SOLOSTAR [place]) Inject 50 Units into the skin at bedtime.     magnesium oxide (MAG-OX) 400 MG tablet Take 400 mg by mouth 2 (two) times daily.     mexiletine (MEXITIL) 150 MG capsule TAKE 1 CAPSULE(150 MG) BY MOUTH EVERY 12 HOURS 180 capsule 0   nitroGLYCERIN (NITROSTAT) 0.4 MG SL tablet Place 1 tablet (0.4 mg total) under the tongue every 5 (five) minutes as needed for chest pain. 30 tablet 1   NOVOLOG FLEXPEN 100 UNIT/ML FlexPen Inject 5 Units into the skin 3 (three) times daily.     pantoprazole (PROTONIX) 40 MG tablet Take 40 mg by mouth every evening.      PRALUENT 75 MG/ML SOAJ ADMINISTER 1 ML UNDER THE SKIN EVERY 14 DAYS 2 mL 3   pramipexole (MIRAPEX) 1 MG tablet Take 0.5 mg by mouth 4 (four) times daily.      sacubitril-valsartan (ENTRESTO) 49-51 MG Take 1 tablet by mouth 2 (two) times daily. 60 tablet 3   spironolactone (ALDACTONE) 25 MG tablet TAKE 1 TABLET(25 MG) BY MOUTH 1 TIME FOR 1 DOSE 90 tablet 0   No current facility-administered medications for this encounter.   Wt Readings from Last 3 Encounters:  04/29/21 94.1 kg (207 lb 6.4 oz)  01/21/21 93.1 kg (205 lb 3.2 oz)  11/19/20  94.9 kg (209 lb 3.2 oz)   BP 128/78   Pulse 63   Wt 94.1 kg (207 lb 6.4 oz)   SpO2 95%   BMI 29.76 kg/m   General:  NAD. No resp difficulty HEENT: Normal Neck: Supple. Thick neck. Carotids 2+ bilat; no bruits. No lymphadenopathy or thryomegaly appreciated. Cor: PMI nondisplaced. Regular rate & rhythm. No rubs, gallops or murmurs. Lungs: Coarse BS RLL. Abdomen: Obese,  nontender, nondistended. No hepatosplenomegaly. No bruits or masses. Good bowel sounds. Extremities: No cyanosis, clubbing, rash, edema Neuro: Alert &  oriented x 3, cranial nerves grossly intact. Moves all 4 extremities w/o difficulty. Affect pleasant.  Assessment/Plan: 1. CAD: Cath in 5/20 showed chronically occluded RCA with patent LCx system stents.  No interventional target.  No chest pain.  He does not take statins due to myalgias.  - Continue ASA 81 daily and Plavix 75 mg daily.   - Continue Praluent, good lipids in 3/22.   2. Chronic systolic CHF: Ischemic cardiomyopathy.  Medtronic ICD.  Narrow QRS so not CRT candidate.  Echo in 5/20 with EF 20-25%, but echo in 2/21 looked better, with EF 35-40%.  CPX in 3/21 with moderate HF limitation.  Echo 6/22 EF 35-40%.  Currently, NYHA class II symptoms.  Not volume overloaded by exam or Optivol.     - Continue Entresto 49/51 mg bid (dizziness with increased dose).  - Continue Lasix to 40 mg bid.  - Continue spironolactone 25 mg daily.  - Continue Coreg 12.5 mg bid.   - Continue empagliflozin.  3. OSA: Untreated, sounds severe based on his wife's description.  He has not been able to tolerate CPAP in the past and is not willing to try again. I suspect this is a major reason why he is tired/sleepy during the day.  4. PVCs: History of PVCs, now on mexiletine. Most recent Zio patch in 3/21 with only 2% PVCs.  There was question of amiodarone lung toxicity during 5/21 admission and amiodarone was stopped, but pulmonary consult thought more likely UIP.  5. ILD: Restrictive PFTs  noted with CPX.  CT chest in 4/21 concerning for UIP.  Dr. Shirlee Latch stopped amiodarone due to concern that this could represent amiodarone lung toxicity, but pulmonary thought less likely than UIP.  He saw pulmonary and decided against treatment with Esbriet.   Followup in 3 months with Dr. Kathreen Cornfield [facility] FNP 04/29/2021

## 2021-04-29 ENCOUNTER — Encounter (HOSPITAL_COMMUNITY): Payer: Self-pay

## 2021-04-29 ENCOUNTER — Other Ambulatory Visit: Payer: Self-pay

## 2021-04-29 ENCOUNTER — Ambulatory Visit (HOSPITAL_COMMUNITY)
Admission: RE | Admit: 2021-04-29 | Discharge: 2021-04-29 | Disposition: A | Discharge status: Home / Self Care | Payer: Medicare Other | Source: Ambulatory Visit | Attending: Family Medicine | Admitting: Family Medicine

## 2021-04-29 VITALS — BP 128/78 | HR 63 | Wt 207.4 lb

## 2021-04-29 DIAGNOSIS — Z955 Presence of coronary angioplasty implant and graft: Secondary | ICD-10-CM | POA: Diagnosis not present

## 2021-04-29 DIAGNOSIS — Z7982 Long term (current) use of aspirin: Secondary | ICD-10-CM | POA: Diagnosis not present

## 2021-04-29 DIAGNOSIS — I2510 Atherosclerotic heart disease of native coronary artery without angina pectoris: Secondary | ICD-10-CM | POA: Diagnosis not present

## 2021-04-29 DIAGNOSIS — I110 Hypertensive heart disease with heart failure: Secondary | ICD-10-CM | POA: Diagnosis not present

## 2021-04-29 DIAGNOSIS — Z794 Long term (current) use of insulin: Secondary | ICD-10-CM | POA: Diagnosis not present

## 2021-04-29 DIAGNOSIS — I5022 Chronic systolic (congestive) heart failure: Secondary | ICD-10-CM | POA: Diagnosis present

## 2021-04-29 DIAGNOSIS — I493 Ventricular premature depolarization: Secondary | ICD-10-CM

## 2021-04-29 DIAGNOSIS — Z8249 Family history of ischemic heart disease and other diseases of the circulatory system: Secondary | ICD-10-CM | POA: Diagnosis not present

## 2021-04-29 DIAGNOSIS — G4733 Obstructive sleep apnea (adult) (pediatric): Secondary | ICD-10-CM

## 2021-04-29 DIAGNOSIS — Z7902 Long term (current) use of antithrombotics/antiplatelets: Secondary | ICD-10-CM | POA: Diagnosis not present

## 2021-04-29 DIAGNOSIS — E785 Hyperlipidemia, unspecified: Secondary | ICD-10-CM | POA: Insufficient documentation

## 2021-04-29 DIAGNOSIS — J849 Interstitial pulmonary disease, unspecified: Secondary | ICD-10-CM | POA: Diagnosis not present

## 2021-04-29 DIAGNOSIS — E119 Type 2 diabetes mellitus without complications: Secondary | ICD-10-CM | POA: Insufficient documentation

## 2021-04-29 DIAGNOSIS — Z87891 Personal history of nicotine dependence: Secondary | ICD-10-CM | POA: Insufficient documentation

## 2021-04-29 DIAGNOSIS — I255 Ischemic cardiomyopathy: Secondary | ICD-10-CM | POA: Insufficient documentation

## 2021-04-29 DIAGNOSIS — Z79899 Other long term (current) drug therapy: Secondary | ICD-10-CM | POA: Diagnosis not present

## 2021-04-29 LAB — BASIC METABOLIC PANEL
Anion gap: 11 (ref 5–15)
BUN: 18 mg/dL (ref 8–23)
CO2: 21 mmol/L — ABNORMAL LOW (ref 22–32)
Calcium: 9.3 mg/dL (ref 8.9–10.3)
Chloride: 100 mmol/L (ref 98–111)
Creatinine, Ser: 0.97 mg/dL (ref 0.61–1.24)
GFR, Estimated: >60 mL/min (ref >60)
Glucose, Bld: 295 mg/dL — ABNORMAL HIGH (ref 70–99)
Potassium: 4.2 mmol/L (ref 3.5–5.1)
Sodium: 132 mmol/L — ABNORMAL LOW (ref 135–145)

## 2021-04-29 NOTE — Patient Instructions (Addendum)
Labs were performed today , if any labs are abnormal the clinic will call you  Your physician recommends that you schedule a follow-up appointment in: 2-3 month   At the [facility], you and your health needs are our priority. As part of our continuing mission to provide you with exceptional heart care, we have created designated Provider Care Teams. These Care Teams include your primary Cardiologist (physician) and Advanced Practice Providers (APPs- Physician Assistants and Nurse Practitioners) who all work together to provide you with the care you need, when you need it.   You may see any of the following providers on your designated Care Team at your next follow up: Dr Arvilla Meres Dr Marca Ancona Dr Brandon Melnick, NP Robbie Lis, [place] Mikki Santee Karle Plumber, PharmD   Please be sure to bring in all your medications bottles to every appointment.    If you have any questions or concerns before your next appointment please send [place] a message through [place] or call our office at [phone].    TO LEAVE A MESSAGE FOR THE NURSE SELECT OPTION 2, PLEASE LEAVE A MESSAGE INCLUDING: YOUR NAME DATE OF BIRTH CALL BACK NUMBER REASON FOR CALL**this is important as we prioritize the call backs  YOU WILL RECEIVE A CALL BACK THE SAME DAY AS LONG AS YOU CALL BEFORE 4:00 PM

## 2021-05-02 NOTE — Progress Notes (Signed)
Remote ICD transmission.   

## 2021-05-06 ENCOUNTER — Other Ambulatory Visit (HOSPITAL_COMMUNITY): Payer: Self-pay | Admitting: Cardiology

## 2021-05-10 ENCOUNTER — Other Ambulatory Visit (HOSPITAL_COMMUNITY): Payer: Self-pay

## 2021-05-10 MED ORDER — ENTRESTO 49-51 MG PO TABS
1.0000 | ORAL_TABLET | Freq: Two times a day (BID) | ORAL | 5 refills | Status: DC
Start: 2021-05-10 — End: 2021-05-14

## 2021-05-13 ENCOUNTER — Other Ambulatory Visit (HOSPITAL_COMMUNITY): Payer: Self-pay | Admitting: *Deleted

## 2021-05-14 MED ORDER — ENTRESTO 49-51 MG PO TABS: 1.0000 | ORAL_TABLET | Freq: Two times a day (BID) | ORAL | 5 refills | Status: DC

## 2021-05-14 NOTE — Addendum Note (Signed)
Addended by: Theresia Bough on: 05/14/2021 10:50 AM   Modules accepted: Orders

## 2021-06-22 ENCOUNTER — Other Ambulatory Visit: Payer: Self-pay | Admitting: Cardiology

## 2021-07-22 ENCOUNTER — Other Ambulatory Visit: Payer: Self-pay

## 2021-07-22 ENCOUNTER — Ambulatory Visit (HOSPITAL_COMMUNITY)
Admission: RE | Admit: 2021-07-22 | Discharge: 2021-07-22 | Disposition: A | Discharge status: Home / Self Care | Payer: Medicare Other | Source: Ambulatory Visit | Attending: Internal Medicine | Admitting: Internal Medicine

## 2021-07-22 ENCOUNTER — Encounter (HOSPITAL_COMMUNITY): Payer: Self-pay | Admitting: Internal Medicine

## 2021-07-22 VITALS — BP 130/88 | HR 69 | Wt 203.6 lb

## 2021-07-22 DIAGNOSIS — I5022 Chronic systolic (congestive) heart failure: Secondary | ICD-10-CM | POA: Diagnosis not present

## 2021-07-22 DIAGNOSIS — I110 Hypertensive heart disease with heart failure: Secondary | ICD-10-CM | POA: Diagnosis present

## 2021-07-22 DIAGNOSIS — I472 Ventricular tachycardia, unspecified: Secondary | ICD-10-CM | POA: Diagnosis not present

## 2021-07-22 DIAGNOSIS — I493 Ventricular premature depolarization: Secondary | ICD-10-CM | POA: Insufficient documentation

## 2021-07-22 DIAGNOSIS — G4733 Obstructive sleep apnea (adult) (pediatric): Secondary | ICD-10-CM | POA: Diagnosis not present

## 2021-07-22 DIAGNOSIS — I4891 Unspecified atrial fibrillation: Secondary | ICD-10-CM | POA: Insufficient documentation

## 2021-07-22 DIAGNOSIS — J42 Unspecified chronic bronchitis: Secondary | ICD-10-CM | POA: Diagnosis not present

## 2021-07-22 DIAGNOSIS — I2510 Atherosclerotic heart disease of native coronary artery without angina pectoris: Secondary | ICD-10-CM | POA: Diagnosis not present

## 2021-07-22 DIAGNOSIS — Z79899 Other long term (current) drug therapy: Secondary | ICD-10-CM | POA: Insufficient documentation

## 2021-07-22 DIAGNOSIS — Z95810 Presence of automatic (implantable) cardiac defibrillator: Secondary | ICD-10-CM | POA: Insufficient documentation

## 2021-07-22 DIAGNOSIS — Z7984 Long term (current) use of oral hypoglycemic drugs: Secondary | ICD-10-CM | POA: Insufficient documentation

## 2021-07-22 DIAGNOSIS — Z955 Presence of coronary angioplasty implant and graft: Secondary | ICD-10-CM | POA: Insufficient documentation

## 2021-07-22 DIAGNOSIS — Z7982 Long term (current) use of aspirin: Secondary | ICD-10-CM | POA: Diagnosis not present

## 2021-07-22 DIAGNOSIS — Z7902 Long term (current) use of antithrombotics/antiplatelets: Secondary | ICD-10-CM | POA: Insufficient documentation

## 2021-07-22 DIAGNOSIS — J849 Interstitial pulmonary disease, unspecified: Secondary | ICD-10-CM | POA: Insufficient documentation

## 2021-07-22 DIAGNOSIS — I255 Ischemic cardiomyopathy: Secondary | ICD-10-CM | POA: Diagnosis not present

## 2021-07-22 LAB — BASIC METABOLIC PANEL
Anion gap: 8 (ref 5–15)
BUN: 17 mg/dL (ref 8–23)
CO2: 23 mmol/L (ref 22–32)
Calcium: 9.4 mg/dL (ref 8.9–10.3)
Chloride: 102 mmol/L (ref 98–111)
Creatinine, Ser: 0.84 mg/dL (ref 0.61–1.24)
GFR, Estimated: >60 mL/min (ref >60)
Glucose, Bld: 171 mg/dL — ABNORMAL HIGH (ref 70–99)
Potassium: 4.3 mmol/L (ref 3.5–5.1)
Sodium: 133 mmol/L — ABNORMAL LOW (ref 135–145)

## 2021-07-22 LAB — BRAIN NATRIURETIC PEPTIDE: B Natriuretic Peptide: 18.3 pg/mL (ref 0.0–100.0)

## 2021-07-22 MED ORDER — CARVEDILOL 12.5 MG PO TABS: 18.7500 mg | ORAL_TABLET | Freq: Two times a day (BID) | ORAL | 3 refills | Status: DC

## 2021-07-22 NOTE — Progress Notes (Addendum)
ADVANCED HF CLINIC NOTE  PCP: Hal Morales, NP Cardiology: Dr. Antoine Poche HF Cardiology: Dr. Shirlee Latch  65 y.o. with history of CAD, ischemic cardiomyopathy/chronic systolic CHF, OSA, and VT was referred by Dr. Antoine Poche for evaluation of CHF.  Patient has a long history of CAD (see PMH below).  Last cath in 5/20 showed a chronically occluded RCA with collaterals and stable stents in the LCx system, no intervention.  He has had an ischemic cardiomyopathy with EF 20-25% in 5/20, and actually up to 35-40% on 2/21 echo (I reviewed images today).  He has a Medtronic ICD and is on amiodarone for VT. He has significant OSA but has not been able to tolerate CPAP with a facemask.    CPX was done in 3/21, study was submaximal with moderate HF limitation but also functional limitation from pulmonary restriction and exertional hypoxemia.  V/Q scan was done and was not suggestive of chronic PE.  Zio patch in 3/21 showed 2% PVCs.   He was admitted in 5/21 with PNA on a background of interstitial lung disease (looks like UIP on high resolution CT chest from 4/21).  Seen by Dr. Isaiah Serge with pulmonary, thought probably not amiodarone lung toxicity but we stopped amiodarone and put him on mexiletine.  He was on oxygen in the hospital.  He decided not to start Esbriet for pulmonary fibrosis.   Echo 6/22 EF 35-40% with inferior and inferolateral hypokinesis, mild RV enlargement, mildly decreased RV systolic function.   He returns today for followup of CHF.  Weight down 4 lbs.  He did not tolerate increase in Entresto due to dizziness.  No further lightheadedness on lower dose of Entresto.  No chest pain.  No dyspnea walking up a flight of stairs.  No orthopnea/PND.  No cough/congestion.   Medtronic device interrogation:  No VT/AF, stable thoracic impedance (personally reviewed).  ECG (personally reviewed): NSR, left axis deviation, nonspecific T wave changes.   Labs (9/20): K 4.5, creatinine 1.08 Labs (3/21): hgb  16.9, K 4.5, creatinine 1.3, AST 34, ALT 54 (mildly elevated), LDL 51, TGs 373, TSH normal.  Labs (5/21): K 3.9, creatinine 1.07 Labs (7/21): K 3.9, creatinine 0.94, LDL 46, HDL 38 Labs (3/22): K 4.1, creatinine 1.0, LDL 45, HDL 35 Labs (6/22): K 4.0, creatinine 1.0 Labs (9/22): K 4.2, creatinine 0.97  PMH: 1. CAD:  - PCI to OM1 in 2012.  - DES to LCx in 5/19.  - LHC (5/20): 50% proximal LAD, 95% proximal D1, patent LCx and OM1 stents, total occlusion of RCA with collaterals.  2. Chronic systolic CHF: Ischemic cardiomyopathy. Medtronic ICD.  - RHC (5/20): mean RA 3, PA 25/5, mean PCWP 6, CI 1.86 - Echo (5/20): EF 20-25% - Echo (2/21): EF 35-40%, mild LV dilation, RV low normal function.  - CPX (3/21): peak VO2 17.6, VE/VCO2 slope 43, RER 0.96 => submaximal, moderate HF limitation but also noted is pulmonary restriction with exertional hypoxemia.  - Echo (6/22): EF 35-40% with inferior and inferolateral hypokinesis, mild RV enlargement, mildly decreased RV systolic function. 3. VT: Now on amiodarone.  4. OSA: Has not tolerate CPAP.  5. HTN 6. Type 2 diabetes 7. Hyperlipidemia: Has not tolerated statins.  8. PVCs - Zio patch (3/21) with 2% PVCs 9. Interstitial lung disease: high resolution CT chest in 4/21 concerning for usual interstitial pneumonitis  SH: Married, lives in [place], retired, quit smoking in 2007.   Family History  Problem Relation Age of Onset   Lymphoma Mother  Heart attack Father 30   Heart attack Brother 91   ROS: All systems reviewed and negative except as per HPI.   Current Outpatient Medications  Medication Sig Dispense Refill   acetaminophen (TYLENOL) 500 MG tablet Take 500 mg by mouth every 6 (six) hours as needed (for headaches.).     albuterol (VENTOLIN HFA) 108 (90 Base) MCG/ACT inhaler Inhale 1 puff into the lungs every 6 (six) hours as needed for wheezing or shortness of breath. 8 g 0   Alirocumab (PRALUENT) 75 MG/ML SOAJ ADMINISTER 1 ML UNDER  THE SKIN EVERY 14 DAYS 2 mL 11   aspirin 81 MG chewable tablet Chew 1 tablet (81 mg total) by mouth daily.     BD PEN NEEDLE NANO 2ND GEN 32G X 4 MM MISC See admin instructions.     carvedilol (COREG) 12.5 MG tablet TAKE 1 TABLET(12.5 MG) BY MOUTH TWICE DAILY WITH A MEAL 60 tablet 3   CINNAMON PO Take 1,000 mg by mouth 2 (two) times daily.     clopidogrel (PLAVIX) 75 MG tablet Take 1 tablet (75 mg total) by mouth daily. 90 tablet 3   Coenzyme Q10 (CO Q 10) 100 MG CAPS Take 1 tablet by mouth daily.     empagliflozin (JARDIANCE) 25 MG TABS tablet Take 25 mg by mouth daily.     furosemide (LASIX) 40 MG tablet Take 40 mg by mouth 2 (two) times daily.     Insulin Glargine (TOUJEO SOLOSTAR [place]) Inject 50 Units into the skin at bedtime.     magnesium oxide (MAG-OX) 400 MG tablet Take 400 mg by mouth 2 (two) times daily.     mexiletine (MEXITIL) 150 MG capsule TAKE 1 CAPSULE(150 MG) BY MOUTH EVERY 12 HOURS 180 capsule 0   nitroGLYCERIN (NITROSTAT) 0.4 MG SL tablet Place 1 tablet (0.4 mg total) under the tongue every 5 (five) minutes as needed for chest pain. 30 tablet 1   NOVOLOG FLEXPEN 100 UNIT/ML FlexPen Inject 5 Units into the skin 3 (three) times daily.     pantoprazole (PROTONIX) 40 MG tablet Take 40 mg by mouth every evening.      pramipexole (MIRAPEX) 1 MG tablet Take 0.5 mg by mouth 4 (four) times daily.      sacubitril-valsartan (ENTRESTO) 49-51 MG Take 1 tablet by mouth 2 (two) times daily. 60 tablet 5   spironolactone (ALDACTONE) 25 MG tablet Take 1 tablet (25 mg total) by mouth daily. 90 tablet 3   No current facility-administered medications for this encounter.   Wt Readings from Last 3 Encounters:  07/22/21 92.4 kg (203 lb 9.6 oz)  04/29/21 94.1 kg (207 lb 6.4 oz)  01/21/21 93.1 kg (205 lb 3.2 oz)   BP 130/88   Pulse 69   Wt 92.4 kg (203 lb 9.6 oz)   SpO2 95%   BMI 29.21 kg/m   General: NAD Neck: No JVD, no thyromegaly or thyroid nodule.  Lungs: Clear to auscultation  bilaterally with normal respiratory effort. CV: Nondisplaced PMI.  Heart regular S1/S2, no S3/S4, no murmur.  No peripheral edema.  No carotid bruit.  Normal pedal pulses.  Abdomen: Soft, nontender, no hepatosplenomegaly, no distention.  Skin: Intact without lesions or rashes.  Neurologic: Alert and oriented x 3.  Psych: Normal affect. Extremities: No clubbing or cyanosis.  HEENT: Normal.   Assessment/Plan: 1. CAD: Cath in 5/20 showed chronically occluded RCA with patent LCx system stents.  No interventional target.  No chest pain.  He does not  take statins due to myalgias.  - Continue ASA 81 daily and Plavix 75 mg daily.   - Continue Praluent, good lipids in 3/22.   2. Chronic systolic CHF: Ischemic cardiomyopathy.  Medtronic ICD.  Narrow QRS so not CRT candidate.  Echo in 5/20 with EF 20-25%, but echo in 2/21 looked better, with EF 35-40%.  CPX in 3/21 with moderate HF limitation.  Echo 6/22 EF 35-40%.  Currently, NYHA class II symptoms.  Not volume overloaded by exam or Optivol.     - Continue Entresto 49/51 mg bid (unable to tolerate higher dose).  - Continue Lasix to 40 mg bid.  - Continue spironolactone 25 mg daily.  - Increase Coreg to 18.75 mg bid.  Can decrease back to 12.5 mg bid if this causes persistent lightheadedness.  - Continue empagliflozin.  3. OSA: Untreated, sounds severe based on his wife's description.  He has not been able to tolerate CPAP in the past and is not willing to try again. I suspect this is a major reason why he is tired/sleepy during the day.  4. PVCs: History of PVCs, now on mexiletine. Most recent Zio patch in 3/21 with only 2% PVCs.  There was question of amiodarone lung toxicity during 5/21 admission and amiodarone was stopped, but pulmonary consult thought more likely UIP.  5. ILD: Restrictive PFTs noted with CPX.  CT chest in 4/21 concerning for UIP.  Dr. Shirlee Latch stopped amiodarone due to concern that this could represent amiodarone lung toxicity, but  pulmonary thought less likely than UIP.  He saw pulmonary and decided against treatment with Esbriet.  - I recommended that he see pulmonary at least once yearly to assess for progression of disease.   Followup in 3 months.   Marca Ancona 07/22/2021

## 2021-07-22 NOTE — Patient Instructions (Signed)
Medication Changes:  Increase Carvedilol to 18.75mg  Twice daily   Lab Work:  Labs done today, your results will be available in MyChart, we will contact you for abnormal readings.   Testing/Procedures:    Referrals:  You have been referred to Pulmonary office. They will call you with an appointment.   Special Instructions // Education:  none  Follow-Up in: 3 months   At the [facility], you and your health needs are our priority. We have a designated team specialized in the treatment of Heart Failure. This Care Team includes your primary Heart Failure Specialized Cardiologist (physician), Advanced Practice Providers (APPs- Physician Assistants and Nurse Practitioners), and Pharmacist who all work together to provide you with the care you need, when you need it.   You may see any of the following providers on your designated Care Team at your next follow up:  Dr Arvilla Meres Dr Carron Curie, NP Robbie Lis, [place] [facility] [place], [place] Karle Plumber, PharmD   Please be sure to bring in all your medications bottles to every appointment.   Need to Contact us:  If you have any questions or concerns before your next appointment please send [place] a message through [place] or call our office at [phone].    TO LEAVE A MESSAGE FOR THE NURSE SELECT OPTION 2, PLEASE LEAVE A MESSAGE INCLUDING: YOUR NAME DATE OF BIRTH CALL BACK NUMBER REASON FOR CALL**this is important as we prioritize the call backs  YOU WILL RECEIVE A CALL BACK THE SAME DAY AS LONG AS YOU CALL BEFORE 4:00 PM

## 2021-07-23 ENCOUNTER — Ambulatory Visit (INDEPENDENT_AMBULATORY_CARE_PROVIDER_SITE_OTHER): Payer: Medicare Other

## 2021-07-23 DIAGNOSIS — I255 Ischemic cardiomyopathy: Secondary | ICD-10-CM

## 2021-07-23 LAB — CUP PACEART REMOTE DEVICE CHECK
Battery Remaining Longevity: 110 mo
Battery Voltage: 3 V
Brady Statistic RV Percent Paced: 0.02 %
Date Time Interrogation Session: 20221206012204
HighPow Impedance: 90 Ohm
Implantable Lead Implant Date: 20191202
Implantable Lead Location: 753860
Implantable Pulse Generator Implant Date: 20191202
Lead Channel Impedance Value: 323 Ohm
Lead Channel Impedance Value: 437 Ohm
Lead Channel Pacing Threshold Amplitude: 0.75 V
Lead Channel Pacing Threshold Pulse Width: 0.4 ms
Lead Channel Sensing Intrinsic Amplitude: 2.625 mV
Lead Channel Sensing Intrinsic Amplitude: 2.625 mV
Lead Channel Setting Pacing Amplitude: 2.5 V
Lead Channel Setting Pacing Pulse Width: 0.4 ms
Lead Channel Setting Sensing Sensitivity: 0.3 mV

## 2021-08-01 NOTE — Progress Notes (Signed)
Remote ICD transmission.   

## 2021-09-20 ENCOUNTER — Other Ambulatory Visit (HOSPITAL_COMMUNITY): Payer: Self-pay | Admitting: *Deleted

## 2021-09-20 MED ORDER — MEXILETINE HCL 150 MG PO CAPS: ORAL_CAPSULE | ORAL | 3 refills | Status: DC

## 2021-09-27 ENCOUNTER — Other Ambulatory Visit (HOSPITAL_COMMUNITY): Payer: Self-pay

## 2021-09-27 ENCOUNTER — Telehealth (HOSPITAL_COMMUNITY): Payer: Self-pay | Admitting: Pharmacy Technician

## 2021-09-27 NOTE — Telephone Encounter (Signed)
Advanced [facility] Failure Patient Advocate Encounter  Patient Advocate Encounter   Received notification from [employer] Part [employer] that prior authorization for Mexiletine is required.   PA submitted on CoverMyMeds Key  [id number] Status is pending   Will continue to follow.

## 2021-10-08 ENCOUNTER — Other Ambulatory Visit: Payer: Self-pay

## 2021-10-08 ENCOUNTER — Ambulatory Visit (INDEPENDENT_AMBULATORY_CARE_PROVIDER_SITE_OTHER): Payer: Medicare Other | Admitting: Cardiology

## 2021-10-08 ENCOUNTER — Encounter: Payer: Self-pay | Admitting: Cardiology

## 2021-10-08 VITALS — BP 114/68 | HR 54 | Ht 70.0 in | Wt 206.2 lb

## 2021-10-08 DIAGNOSIS — I2510 Atherosclerotic heart disease of native coronary artery without angina pectoris: Secondary | ICD-10-CM

## 2021-10-08 DIAGNOSIS — I5022 Chronic systolic (congestive) heart failure: Secondary | ICD-10-CM

## 2021-10-08 DIAGNOSIS — I10 Essential (primary) hypertension: Secondary | ICD-10-CM

## 2021-10-08 DIAGNOSIS — I472 Ventricular tachycardia, unspecified: Secondary | ICD-10-CM

## 2021-10-08 NOTE — Progress Notes (Signed)
Electrophysiology Office Note   Date:  10/08/2021   ID:  [name] 1956-06-03, MRN [id number]  PCP:  [name], FNP  Cardiologist:  [place] Primary Electrophysiologist:  Constance Haw, MD    No chief complaint on Ramos.    History of Present Illness: [name] is a 66 y.o. male who is being seen today for the evaluation of ischemic cardiomyopathy at the request of [name]. Presenting today for electrophysiology evaluation.    He has a history significant for chronic systolic heart failure due to ischemic cardiomyopathy, coronary artery disease, hypertension, OSA, type 2 diabetes.  He is status post Medtronic ICD implanted 07/19/2018.  He has had episodes of ventricular tachycardia and is now on mexiletine.  Today, denies symptoms of palpitations, chest pain, shortness of breath, orthopnea, PND, lower extremity edema, claudication, dizziness, presyncope, syncope, bleeding, or neurologic sequela. The patient is tolerating medications without difficulties.  He has done well.  He has no chest pain or shortness of breath.  He is able do all of his daily activities.  He continues to be active, working in his yard without issue.   Past Medical History:  Diagnosis Date   ASCVD (arteriosclerotic cardiovascular disease)    a. MI 2004, did not seek care at that time. b. s/p PTCA/BMS to OM1 04/2011 (known totally occluded RCA with L-R collaterals and failed angioplasty on that vessel). c.)  Cath 2019 chronically occluded right coronary artery with collateralization.  90% circumflex stenosis treated with a DES.   CHF (congestive heart failure) (HCC)    Chronic bronchitis (HCC)    HTN (hypertension)    Hyperlipidemia    Ischemic cardiomyopathy    EF 30%.     OSA (obstructive sleep apnea) 05/24/11   Restless leg syndrome 06/25/2011   Type 2 diabetes mellitus [facility])    Past Surgical History:  Procedure Laterality Date   CARDIAC CATHETERIZATION  11/24/2012    40-50% ostial LAD, 40% mid LAD, 60-70% ostial D1, 40% proximal LCx, patent OM branch stent, 99% serial proximal-mid RCA lesions, 100% mid RCA occlusion (chronic with left to right collateralization); EF 40%, inferior wall hypokinesis.   CARDIAC CATHETERIZATION N/A 05/31/2015   Procedure: Left Heart Cath and Coronary Angiography;  Surgeon: Burnell Blanks, MD;  Location: [place] [facility];  Service: Cardiovascular;  Laterality: N/A;   CORONARY STENT INTERVENTION N/A 12/25/2017   Procedure: CORONARY STENT INTERVENTION;  Surgeon: Martinique, Peter M, MD;  Location: [place] [facility];  Service: Cardiovascular;  Laterality: N/A;   CORONARY STENT PLACEMENT  04/2011   CYSTECTOMY     Spine and jaw   ICD IMPLANT N/A 07/19/2018   Procedure: ICD IMPLANT;  Surgeon: Constance Haw, MD;  Location: [place] [facility];  Service: Cardiovascular;  Laterality: N/A;   LEFT HEART CATH AND CORONARY ANGIOGRAPHY N/A 12/25/2017   Procedure: LEFT HEART CATH AND CORONARY ANGIOGRAPHY;  Surgeon: Martinique, Peter M, MD;  Location: [place] [facility];  Service: Cardiovascular;  Laterality: N/A;   LEFT HEART CATHETERIZATION WITH CORONARY ANGIOGRAM N/A 11/24/2012   Procedure: LEFT HEART CATHETERIZATION WITH CORONARY ANGIOGRAM;  Surgeon: Burnell Blanks, MD;  Location: [facility] CATH LAB;  Service: Cardiovascular;  Laterality: N/A;   lesion removed from foot     right foot   RIGHT/LEFT HEART CATH AND CORONARY ANGIOGRAPHY N/A 01/06/2019   Procedure: RIGHT/LEFT HEART CATH AND CORONARY ANGIOGRAPHY;  Surgeon: Lorretta Harp, MD;  Location: [place] [facility];  Service:  Cardiovascular;  Laterality: N/A;   TONSILLECTOMY AND ADENOIDECTOMY       Current Outpatient Medications  Medication Sig Dispense Refill   acetaminophen (TYLENOL) 500 MG tablet Take 500 mg by mouth every 6 (six) hours as needed (for headaches.).     albuterol (VENTOLIN HFA) 108 (90 Base) MCG/ACT inhaler Inhale 1 puff into the lungs every 6 (six) hours as  needed for wheezing or shortness of breath. 8 g 0   Alirocumab (PRALUENT) 75 MG/ML SOAJ ADMINISTER 1 ML UNDER THE SKIN EVERY 14 DAYS 2 mL 11   aspirin 81 MG chewable tablet Chew 1 tablet (81 mg total) by mouth daily.     BD PEN NEEDLE NANO 2ND GEN 32G X 4 MM MISC See admin instructions.     carvedilol (COREG) 12.5 MG tablet Take 1.5 tablets (18.75 mg total) by mouth 2 (two) times daily with a meal. 90 tablet 3   CINNAMON PO Take 1,000 mg by mouth 2 (two) times daily.     clopidogrel (PLAVIX) 75 MG tablet Take 1 tablet (75 mg total) by mouth daily. 90 tablet 3   Coenzyme Q10 (CO Q 10) 100 MG CAPS Take 1 tablet by mouth daily.     empagliflozin (JARDIANCE) 25 MG TABS tablet Take 25 mg by mouth daily.     furosemide (LASIX) 40 MG tablet Take 40 mg by mouth 2 (two) times daily.     magnesium oxide (MAG-OX) 400 MG tablet Take 400 mg by mouth 2 (two) times daily.     mexiletine (MEXITIL) 150 MG capsule TAKE 1 CAPSULE(150 MG) BY MOUTH EVERY 12 HOURS 180 capsule 3   nitroGLYCERIN (NITROSTAT) 0.4 MG SL tablet Place 1 tablet (0.4 mg total) under the tongue every 5 (five) minutes as needed for chest pain. 30 tablet 1   NOVOLOG FLEXPEN 100 UNIT/ML FlexPen Inject 5 Units into the skin 3 (three) times daily.     pantoprazole (PROTONIX) 40 MG tablet Take 40 mg by mouth every evening.      pramipexole (MIRAPEX) 1 MG tablet Take 1 mg by mouth in the morning and at bedtime.     sacubitril-valsartan (ENTRESTO) 49-51 MG Take 1 tablet by mouth 2 (two) times daily. 60 tablet 5   spironolactone (ALDACTONE) 25 MG tablet Take 1 tablet (25 mg total) by mouth daily. 90 tablet 3   Insulin Glargine (TOUJEO SOLOSTAR [place]) Inject 50 Units into the skin at bedtime.     No current facility-administered medications for this visit.    Allergies:   Metformin and related, Brilinta [ticagrelor], Codeine, Lipitor [atorvastatin calcium], and Niaspan [niacin er]   Social History:  The patient  reports that he quit smoking about 16  years ago. His smoking use included cigarettes. He has a 52.50 pack-year smoking history. He has never used smokeless tobacco. He reports that he does not drink alcohol and does not use drugs.   Family History:  The patient's family history includes Heart attack (age of onset: 31) in his brother; Heart attack (age of onset: 72) in his father; Lymphoma in his mother.   ROS:  Please see the history of present illness.   Otherwise, review of systems is positive for none.   All other systems are reviewed and negative.   PHYSICAL EXAM: VS:  BP 114/68    Pulse (!) 54    Ht 5\' 10"  (1.778 m)    Wt 206 lb 3.2 oz (93.5 kg)    SpO2 98%    BMI  29.59 kg/m  , BMI Body mass index is 29.59 kg/m. GEN: Well nourished, well developed, in no acute distress  HEENT: normal  Neck: no JVD, carotid bruits, or masses Cardiac: RRR; no murmurs, rubs, or gallops,no edema  Respiratory:  clear to auscultation bilaterally, normal work of breathing GI: soft, nontender, nondistended, + BS MS: no deformity or atrophy  Skin: warm and dry, device site well healed Neuro:  Strength and sensation are intact Psych: euthymic mood, full affect  EKG:  EKG is ordered today. Personal review of the ekg ordered shows sinus rhythm, rate 54, first-degree AV block, inferior infarct  Personal review of the device interrogation today. Results in [place]: 07/22/2021: B Natriuretic Peptide 18.3; BUN 17; Creatinine, Ser 0.84; Potassium 4.3; Sodium 133    Lipid Panel     Component Value Date/Time   CHOL 129 11/09/2020 1059   TRIG 246 (H) 11/09/2020 1059   HDL 35 (L) 11/09/2020 1059   CHOLHDL 3.7 11/09/2020 1059   VLDL 49 (H) 11/09/2020 1059   LDLCALC 45 11/09/2020 1059     Wt Readings from Last 3 Encounters:  10/08/21 206 lb 3.2 oz (93.5 kg)  07/22/21 203 lb 9.6 oz (92.4 kg)  04/29/21 207 lb 6.4 oz (94.1 kg)      Other studies Reviewed: Additional studies/ records that were reviewed today include: TTE 01/21/21   Review of the above records today demonstrates:   1. Left ventricular ejection fraction, by estimation, is 35 to 40%. The  left ventricle has moderately decreased function. The left ventricle  demonstrates regional wall motion abnormalities with inferolateral and  inferior hypokinesis. The left  ventricular internal cavity size was mildly dilated. Left ventricular  diastolic parameters are consistent with Grade I diastolic dysfunction  (impaired relaxation).   2. Right ventricular systolic function is mildly reduced. The right  ventricular size is mildly enlarged. Tricuspid regurgitation signal is  inadequate for assessing PA pressure.   3. The mitral valve is normal in structure. No evidence of mitral valve  regurgitation. No evidence of mitral stenosis.   4. The aortic valve is tricuspid. Aortic valve regurgitation is not  visualized. No aortic stenosis is present.   5. Aortic dilatation noted. There is mild dilatation of the aortic root,  measuring 37 mm.   6. The inferior vena cava is normal in size with greater than 50%  respiratory variability, suggesting right atrial pressure of 3 mmHg.   LHC 12/25/17 Prox RCA lesion is 99% stenosed. Prox RCA to Mid RCA lesion is 90% stenosed. Mid RCA lesion is 100% stenosed. Prox LAD lesion is 40% stenosed. Ost 1st Diag lesion is 90% stenosed. Prox LAD to Mid LAD lesion is 10% stenosed. Previously placed Ost 1st Mrg to 1st Mrg stent (unknown type) is widely patent. Ost Cx to Prox Cx lesion is 90% stenosed. Post intervention, there is a 0% residual stenosis. A drug-eluting stent was successfully placed using a STENT SYNERGY DES 3.5X12. Non-stenotic Prox Cx to Mid Cx lesion. LV end diastolic pressure is normal.   ASSESSMENT AND PLAN:  1.  Coronary artery disease: Status post multiple stents.  No current chest pain.  Continue with current management.  2.  Chronic systolic heart failure due to ischemic cardiomyopathy: Currently on optimal  medical therapy with Entresto 49/51 mg twice daily, Aldactone 25 mg daily, carvedilol 18.75 mg twice daily.  Is status post Medtronic PD implanted 09/29/2017.  Device functioning appropriately.  No changes at this time.  3.  Ventricular tachycardia: Currently on mexiletine 150 mg twice daily.  4.  Hypertension: well controlled  Current medicines are reviewed at length with the patient today.   The patient does not have concerns regarding his medicines.  The following changes were made today: none  Labs/ tests ordered today include:  Orders Placed This Encounter  Procedures   EKG 12-Lead    Disposition:   FU with Nalda Shackleford 12 months  Signed, Kenyata Guess Meredith Leeds, MD  10/08/2021 9:42 AM     [address] [place] [id number] [phone] (office) [phone] (fax)

## 2021-10-08 NOTE — Patient Instructions (Signed)
Medication Instructions:  Your physician recommends that you continue on your current medications as directed. Please refer to the Current Medication list given to you today.  *If you need a refill on your cardiac medications before your next appointment, please call your pharmacy*   Lab Work: None ordered   Testing/Procedures: None ordered   Follow-Up: At [facility], you and your health needs are our priority.  As part of our continuing mission to provide you with exceptional heart care, we have created designated Provider Care Teams.  These Care Teams include your primary Cardiologist (physician) and Advanced Practice Providers (APPs -  Physician Assistants and Nurse Practitioners) who all work together to provide you with the care you need, when you need it.  We recommend signing up for the patient portal called "MyChart".  Sign up information is provided on this After Visit Summary.  MyChart is used to connect with patients for Virtual Visits (Telemedicine).  Patients are able to view lab/test results, encounter notes, upcoming appointments, etc.  Non-urgent messages can be sent to your provider as well.   To learn more about what you can do with MyChart, go to [email or web address].    Remote monitoring is used to monitor your Pacemaker or ICD from home. This monitoring reduces the number of office visits required to check your device to one time per year. It allows [place] to keep an eye on the functioning of your device to ensure it is working properly. You are scheduled for a device check from home on 10/22/2021. You may send your transmission at any time that day. If you have a wireless device, the transmission will be sent automatically. After your physician reviews your transmission, you will receive a postcard with your next transmission date.  Your next appointment:   1 year(s)  The format for your next appointment:   In Person  Provider:   Loman Brooklyn, MD   Thank you  for choosing [facility]!!   Dory Horn, RN [phone]

## 2021-10-10 NOTE — Telephone Encounter (Signed)
Advanced Heart Failure Patient Advocate Encounter  Prior Authorization for Mexiletine has been approved.    PA# [id number] Effective dates: 08/18/21 through 09/27/22  Archer Asa, CPhT

## 2021-10-22 ENCOUNTER — Ambulatory Visit (INDEPENDENT_AMBULATORY_CARE_PROVIDER_SITE_OTHER): Payer: Medicare Other

## 2021-10-22 DIAGNOSIS — I255 Ischemic cardiomyopathy: Secondary | ICD-10-CM

## 2021-10-22 LAB — CUP PACEART REMOTE DEVICE CHECK
Battery Remaining Longevity: 107 mo
Battery Voltage: 3 V
Brady Statistic RV Percent Paced: 0.54 %
Date Time Interrogation Session: 20230307001604
HighPow Impedance: 87 Ohm
Implantable Lead Implant Date: 20191202
Implantable Lead Location: 753860
Implantable Pulse Generator Implant Date: 20191202
Lead Channel Impedance Value: 323 Ohm
Lead Channel Impedance Value: 399 Ohm
Lead Channel Pacing Threshold Amplitude: 0.875 V
Lead Channel Pacing Threshold Pulse Width: 0.4 ms
Lead Channel Sensing Intrinsic Amplitude: 3.25 mV
Lead Channel Sensing Intrinsic Amplitude: 3.25 mV
Lead Channel Setting Pacing Amplitude: 2.5 V
Lead Channel Setting Pacing Pulse Width: 0.4 ms
Lead Channel Setting Sensing Sensitivity: 0.3 mV

## 2021-10-24 ENCOUNTER — Other Ambulatory Visit: Payer: Self-pay

## 2021-10-24 ENCOUNTER — Ambulatory Visit (HOSPITAL_COMMUNITY)
Admission: RE | Admit: 2021-10-24 | Discharge: 2021-10-24 | Disposition: A | Discharge status: Home / Self Care | Payer: Medicare Other | Source: Ambulatory Visit | Attending: Cardiology | Admitting: Cardiology

## 2021-10-24 ENCOUNTER — Encounter (HOSPITAL_COMMUNITY): Payer: Self-pay | Admitting: Cardiology

## 2021-10-24 VITALS — BP 118/70 | HR 60 | Wt 207.2 lb

## 2021-10-24 DIAGNOSIS — Z79899 Other long term (current) drug therapy: Secondary | ICD-10-CM | POA: Insufficient documentation

## 2021-10-24 DIAGNOSIS — G4733 Obstructive sleep apnea (adult) (pediatric): Secondary | ICD-10-CM | POA: Insufficient documentation

## 2021-10-24 DIAGNOSIS — Z7982 Long term (current) use of aspirin: Secondary | ICD-10-CM | POA: Insufficient documentation

## 2021-10-24 DIAGNOSIS — I110 Hypertensive heart disease with heart failure: Secondary | ICD-10-CM | POA: Insufficient documentation

## 2021-10-24 DIAGNOSIS — I5022 Chronic systolic (congestive) heart failure: Secondary | ICD-10-CM | POA: Diagnosis not present

## 2021-10-24 DIAGNOSIS — J849 Interstitial pulmonary disease, unspecified: Secondary | ICD-10-CM | POA: Insufficient documentation

## 2021-10-24 DIAGNOSIS — I255 Ischemic cardiomyopathy: Secondary | ICD-10-CM | POA: Diagnosis not present

## 2021-10-24 DIAGNOSIS — I5023 Acute on chronic systolic (congestive) heart failure: Secondary | ICD-10-CM | POA: Diagnosis not present

## 2021-10-24 DIAGNOSIS — I2510 Atherosclerotic heart disease of native coronary artery without angina pectoris: Secondary | ICD-10-CM | POA: Insufficient documentation

## 2021-10-24 DIAGNOSIS — Z8679 Personal history of other diseases of the circulatory system: Secondary | ICD-10-CM | POA: Diagnosis not present

## 2021-10-24 DIAGNOSIS — Z955 Presence of coronary angioplasty implant and graft: Secondary | ICD-10-CM | POA: Insufficient documentation

## 2021-10-24 LAB — BASIC METABOLIC PANEL
Anion gap: 11 (ref 5–15)
BUN: 14 mg/dL (ref 8–23)
CO2: 21 mmol/L — ABNORMAL LOW (ref 22–32)
Calcium: 9 mg/dL (ref 8.9–10.3)
Chloride: 102 mmol/L (ref 98–111)
Creatinine, Ser: 0.85 mg/dL (ref 0.61–1.24)
GFR, Estimated: >60 mL/min (ref >60)
Glucose, Bld: 158 mg/dL — ABNORMAL HIGH (ref 70–99)
Potassium: 3.6 mmol/L (ref 3.5–5.1)
Sodium: 134 mmol/L — ABNORMAL LOW (ref 135–145)

## 2021-10-24 LAB — LIPID PANEL
Cholesterol: 158 mg/dL (ref 0–200)
HDL: 33 mg/dL — ABNORMAL LOW (ref >40)
LDL Cholesterol: 66 mg/dL (ref 0–99)
Total CHOL/HDL Ratio: 4.8 RATIO
Triglycerides: 294 mg/dL — ABNORMAL HIGH (ref <150)
VLDL: 59 mg/dL — ABNORMAL HIGH (ref 0–40)

## 2021-10-24 NOTE — Patient Instructions (Signed)
Thank you for your visit today. ? ?Labs done today, your results will be available in MyChart, we will contact you for abnormal readings. ? ?Your physician recommends that you schedule a follow-up appointment in: 4 months (July 2023) ** please call the office in May to arrange your follow up ** ? ?If you have any questions or concerns before your next appointment please send [place] a message through [place] or call our office at [phone].   ? ?TO LEAVE A MESSAGE FOR THE NURSE SELECT OPTION 2, PLEASE LEAVE A MESSAGE INCLUDING: ?YOUR NAME ?DATE OF BIRTH ?CALL BACK NUMBER ?REASON FOR CALL**this is important as we prioritize the call backs ? ?YOU WILL RECEIVE A CALL BACK THE SAME DAY AS LONG AS YOU CALL BEFORE 4:00 PM ? ?At the [facility], you and your health needs are our priority. As part of our continuing mission to provide you with exceptional heart care, we have created designated Provider Care Teams. These Care Teams include your primary Cardiologist (physician) and Advanced Practice Providers (APPs- Physician Assistants and Nurse Practitioners) who all work together to provide you with the care you need, when you need it.  ? ?You may see any of the following providers on your designated Care Team at your next follow up: ?Dr Arvilla Meres ?Dr Marca Ancona ?Tonye Becket, NP ?Robbie Lis, PA ?Jessica Milford,NP ?Anna Genre, PA ?Karle Plumber, PharmD ? ? ?Please be sure to bring in all your medications bottles to every appointment.  ? ? ?

## 2021-10-24 NOTE — Progress Notes (Signed)
? ?ADVANCED HF CLINIC NOTE ? ?PCP: [name], FNP ?Cardiology: Dr. Percival Spanish ?HF Cardiology: Dr. Aundra Dubin ? ?66 y.o. with history of CAD, ischemic cardiomyopathy/chronic systolic CHF, OSA, and VT was referred by Dr. Percival Spanish for evaluation of CHF.  Patient has a long history of CAD (see PMH below).  Last cath in 5/20 showed a chronically occluded RCA with collaterals and stable stents in the LCx system, no intervention.  He has had an ischemic cardiomyopathy with EF 20-25% in 5/20, and actually up to 35-40% on 2/21 echo (I reviewed images today).  He has a Medtronic ICD and is on amiodarone for VT. He has significant OSA but has not been able to tolerate CPAP with a facemask.   ? ?CPX was done in 3/21, study was submaximal with moderate HF limitation but also functional limitation from pulmonary restriction and exertional hypoxemia.  V/Q scan was done and was not suggestive of chronic PE.  Zio patch in 3/21 showed 2% PVCs.  ? ?He was admitted in 5/21 with PNA on a background of interstitial lung disease (looks like UIP on high resolution CT chest from 4/21).  Seen by Dr. Vaughan Browner with pulmonary, thought probably not amiodarone lung toxicity but we stopped amiodarone and put him on mexiletine.  He was on oxygen in the hospital.  He decided not to start [place] for pulmonary fibrosis.  ? ?Echo 6/22 EF 35-40% with inferior and inferolateral hypokinesis, mild RV enlargement, mildly decreased RV systolic function.  ? ?He returns today for followup of CHF.  Weight up 4 lbs.  Still with occasional episodes of lightheadedness.  Same dyspnea, gets short of breath walking 1/8 to 1/4 miles.  Short of breath with inclines.  No chest pain.   ? ?Medtronic device interrogation:  stable thoracic impedance (personally reviewed). ? ?ECG (personally reviewed): NSR, 1st degree AVB, LAFB ? ?Labs (9/20): K 4.5, creatinine 1.08 ?Labs (3/21): hgb 16.9, K 4.5, creatinine 1.3, AST 34, ALT 54 (mildly elevated), LDL 51, TGs 373, TSH normal.   ?Labs (5/21): K 3.9, creatinine 1.07 ?Labs (7/21): K 3.9, creatinine 0.94, LDL 46, HDL 38 ?Labs (3/22): K 4.1, creatinine 1.0, LDL 45, HDL 35 ?Labs (6/22): K 4.0, creatinine 1.0 ?Labs (9/22): K 4.2, creatinine 0.97 ?Labs (12/22): BNP 18, K 4.3, creatinine 0.84 ? ?PMH: ?1. CAD:  ?- PCI to [place] in 2012.  ?- DES to LCx in 5/19.  ?- LHC (5/20): 50% proximal LAD, 95% proximal D1, patent LCx and OM1 stents, total occlusion of RCA with collaterals.  ?2. Chronic systolic CHF: Ischemic cardiomyopathy. Medtronic ICD.  ?- RHC (5/20): mean RA 3, PA 25/5, mean PCWP 6, CI 1.86 ?- Echo (5/20): EF 20-25% ?- Echo (2/21): EF 35-40%, mild LV dilation, RV low normal function.  ?- CPX (3/21): peak VO2 17.6, VE/VCO2 slope 43, RER 0.96 => submaximal, moderate HF limitation but also noted is pulmonary restriction with exertional hypoxemia.  ?- Echo (6/22): EF 35-40% with inferior and inferolateral hypokinesis, mild RV enlargement, mildly decreased RV systolic function. ?3. VT: Now on amiodarone.  ?4. OSA: Has not tolerate CPAP.  ?5. HTN ?6. Type 2 diabetes ?7. Hyperlipidemia: Has not tolerated statins.  ?8. PVCs ?- Zio patch (3/21) with 2% PVCs ?9. Interstitial lung disease: high resolution CT chest in 4/21 concerning for usual interstitial pneumonitis ? ?SH: Married, lives in [place], retired, quit smoking in 2007.  ? ?Family History  ?Problem Relation Age of Onset  ? Lymphoma Mother   ? Heart attack Father 60  ?  Heart attack Brother 27  ? ?ROS: All systems reviewed and negative except as per HPI.  ? ?Current Outpatient Medications  ?Medication Sig Dispense Refill  ? acetaminophen (TYLENOL) 500 MG tablet Take 500 mg by mouth every 6 (six) hours as needed (for headaches.).    ? albuterol (VENTOLIN HFA) 108 (90 Base) MCG/ACT inhaler Inhale 1 puff into the lungs every 6 (six) hours as needed for wheezing or shortness of breath. 8 g 0  ? Alirocumab (PRALUENT) 75 MG/ML SOAJ ADMINISTER 1 ML UNDER THE SKIN EVERY 14 DAYS 2 mL 11  ? aspirin 81 MG  chewable tablet Chew 1 tablet (81 mg total) by mouth daily.    ? BD PEN NEEDLE NANO 2ND GEN 32G X 4 MM MISC See admin instructions.    ? carvedilol (COREG) 12.5 MG tablet Take 1.5 tablets (18.75 mg total) by mouth 2 (two) times daily with a meal. 90 tablet 3  ? CINNAMON PO Take 1,000 mg by mouth 2 (two) times daily.    ? clopidogrel (PLAVIX) 75 MG tablet Take 1 tablet (75 mg total) by mouth daily. 90 tablet 3  ? Coenzyme Q10 (CO Q 10) 100 MG CAPS Take 1 tablet by mouth daily.    ? empagliflozin (JARDIANCE) 25 MG TABS tablet Take 25 mg by mouth daily.    ? furosemide (LASIX) 40 MG tablet Take 40 mg by mouth 2 (two) times daily.    ? Insulin Glargine (TOUJEO SOLOSTAR [place]) Inject 50 Units into the skin at bedtime.    ? magnesium oxide (MAG-OX) 400 MG tablet Take 400 mg by mouth 2 (two) times daily.    ? mexiletine (MEXITIL) 150 MG capsule TAKE 1 CAPSULE(150 MG) BY MOUTH EVERY 12 HOURS 180 capsule 3  ? nitroGLYCERIN (NITROSTAT) 0.4 MG SL tablet Place 1 tablet (0.4 mg total) under the tongue every 5 (five) minutes as needed for chest pain. 30 tablet 1  ? NOVOLOG FLEXPEN 100 UNIT/ML FlexPen Inject 5 Units into the skin 3 (three) times daily.    ? pantoprazole (PROTONIX) 40 MG tablet Take 40 mg by mouth every evening.     ? pramipexole (MIRAPEX) 1 MG tablet Take 1 mg by mouth in the morning and at bedtime.    ? sacubitril-valsartan (ENTRESTO) 49-51 MG Take 1 tablet by mouth 2 (two) times daily. 60 tablet 5  ? spironolactone (ALDACTONE) 25 MG tablet Take 1 tablet (25 mg total) by mouth daily. 90 tablet 3  ? ?No current facility-administered medications for this encounter.  ? ?Wt Readings from Last 3 Encounters:  ?10/24/21 94 kg (207 lb 3.2 oz)  ?10/08/21 93.5 kg (206 lb 3.2 oz)  ?07/22/21 92.4 kg (203 lb 9.6 oz)  ? ?BP 118/70   Pulse 60   Wt 94 kg (207 lb 3.2 oz)   SpO2 94%   BMI 29.73 kg/m?  ?General: NAD ?Neck: No JVD, no thyromegaly or thyroid nodule.  ?Lungs: Dry crackles at bases.  ?CV: Nondisplaced PMI.  Heart  regular S1/S2, no S3/S4, no murmur.  No peripheral edema.  No carotid bruit.  Normal pedal pulses.  ?Abdomen: Soft, nontender, no hepatosplenomegaly, no distention.  ?Skin: Intact without lesions or rashes.  ?Neurologic: Alert and oriented x 3.  ?Psych: Normal affect. ?Extremities: No clubbing or cyanosis.  ?HEENT: Normal.  ? ?Assessment/Plan: ?1. CAD: Cath in 5/20 showed chronically occluded RCA with patent LCx system stents.  No interventional target.  No chest pain.  He does not take statins due to myalgias.  ?-  Continue ASA 81 daily and Plavix 75 mg daily.   ?- Continue Praluent, check lipids today.   ?2. Chronic systolic CHF: Ischemic cardiomyopathy.  Medtronic ICD.  Narrow QRS so not CRT candidate.  Echo in 5/20 with EF 20-25%, but echo in 2/21 looked better, with EF 35-40%.  CPX in 3/21 with moderate HF limitation.  Echo 6/22 with EF 35-40%.  Currently, NYHA class II symptoms.  Not volume overloaded by exam or Optivol.     ?- Continue Entresto 49/51 mg bid (unable to tolerate higher dose).  ?- Continue Lasix to 40 mg bid. BMET today.  ?- Continue spironolactone 25 mg daily.  ?- Continue Coreg 18.75 mg bid, he does not want to try to increase due to history of lightheadedness.  ?- Continue empagliflozin.  ?3. OSA: Untreated, sounds severe based on his wife's description.  He has not been able to tolerate CPAP in the past and is not willing to try again. I suspect this is a major reason why he is tired/sleepy during the day.  ?4. PVCs: History of PVCs, now on mexiletine. Most recent Zio patch in 3/21 with only 2% PVCs.  There was question of amiodarone lung toxicity during 5/21 admission and amiodarone was stopped, but pulmonary consult thought more likely UIP.  ?5. ILD: Restrictive PFTs noted with CPX.  CT chest in 4/21 concerning for UIP.  I stopped amiodarone due to concern that this could represent amiodarone lung toxicity, but pulmonary thought less likely than UIP.  He saw pulmonary and decided against  treatment with Esbriet.  ?- I recommended that he see pulmonary at least once yearly to assess for progression of disease.  ? ?Followup in 4 months.  ? ?Loralie Champagne ?10/24/2021 ? ? ? ?

## 2021-11-04 ENCOUNTER — Other Ambulatory Visit (HOSPITAL_COMMUNITY): Payer: Self-pay | Admitting: Cardiology

## 2021-11-04 NOTE — Progress Notes (Signed)
Remote ICD transmission.   

## 2021-11-12 ENCOUNTER — Other Ambulatory Visit (HOSPITAL_COMMUNITY): Payer: Self-pay

## 2021-12-09 ENCOUNTER — Other Ambulatory Visit (HOSPITAL_COMMUNITY): Payer: Self-pay | Admitting: *Deleted

## 2021-12-09 MED ORDER — CARVEDILOL 12.5 MG PO TABS: 18.7500 mg | ORAL_TABLET | Freq: Two times a day (BID) | ORAL | 3 refills | Status: DC

## 2021-12-09 MED ORDER — FUROSEMIDE 40 MG PO TABS: 40.0000 mg | ORAL_TABLET | Freq: Two times a day (BID) | ORAL | 6 refills | Status: DC

## 2021-12-12 IMAGING — CT CT CHEST HIGH RESOLUTION W/O CM
3 of 6 series · 15 of 36 positions shown, 17 images · non-contrast
Comparison: 01/04/2019 chest CT angiogram. 11/08/2019 chest
radiograph.

CLINICAL DATA: Persistent dyspnea. Amiodarone use. Evaluate for
interstitial lung disease.

EXAM:
CT CHEST WITHOUT CONTRAST
TECHNIQUE: Multidetector CT imaging of the chest was performed following the
standard protocol without intravenous contrast. High resolution
imaging of the lungs, as well as inspiratory and expiratory imaging,
was performed.

[Series 3: chest w/o 2mm st · axial · non-contrast · 0.85mm/px · z∈[+42,+276]mm · 7 of 157 slices shown, 9 images]
[im 20/157  mediastinal]
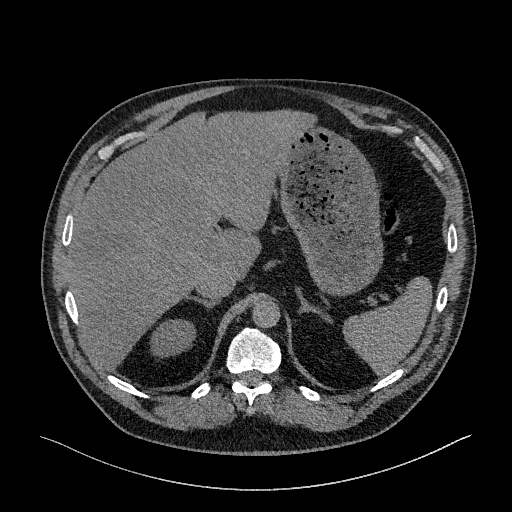
[im 20/157  lung]
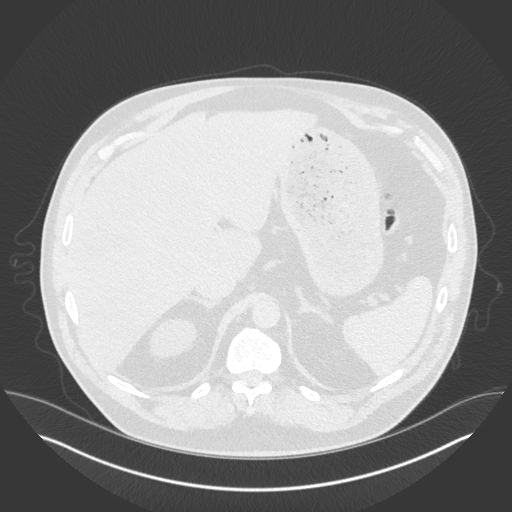
[im 40/157  lung]
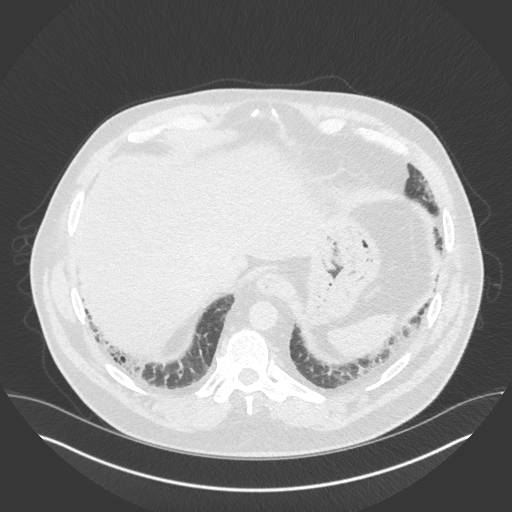
[im 59/157  lung]
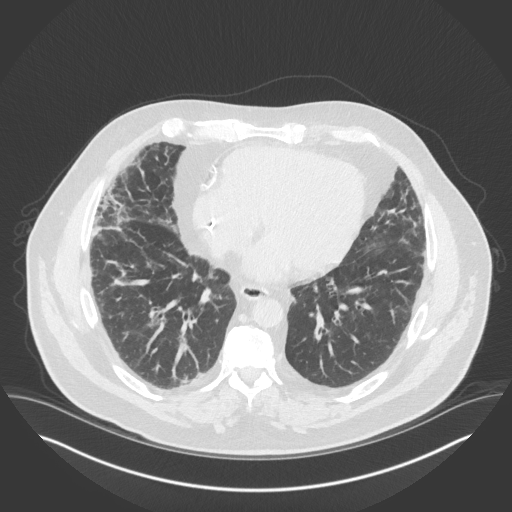
[im 79/157  lung]
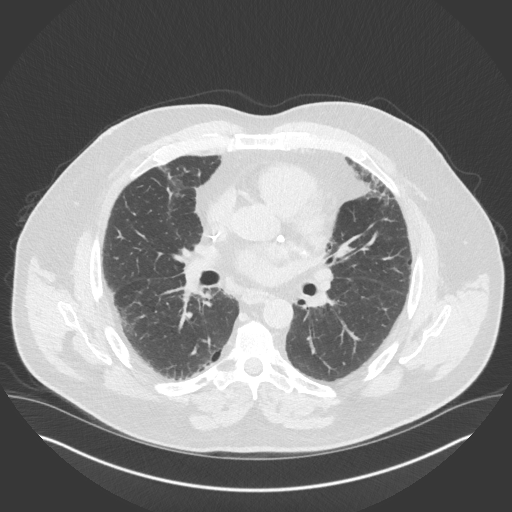
[im 98/157  mediastinal]
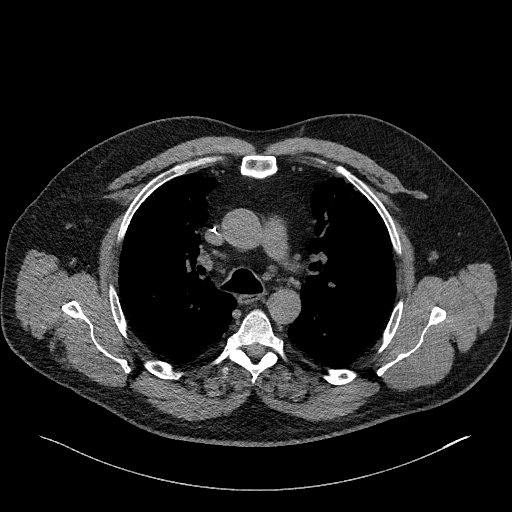
[im 98/157  lung]
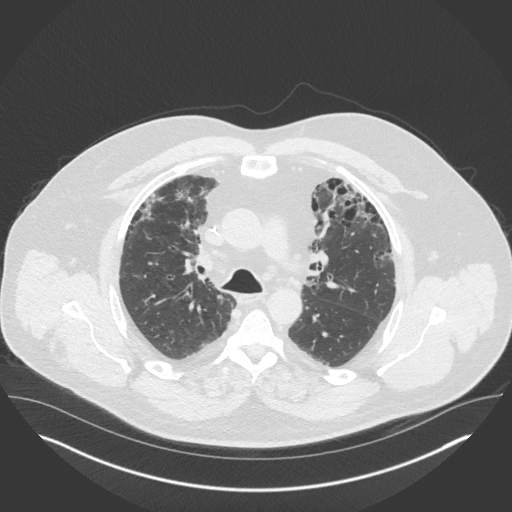
[im 118/157  lung]
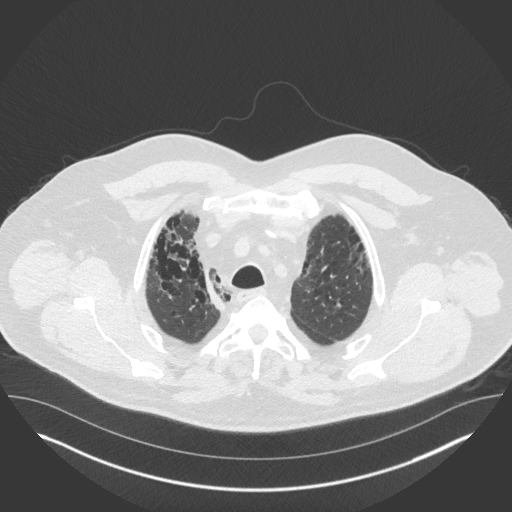
[im 137/157  lung]
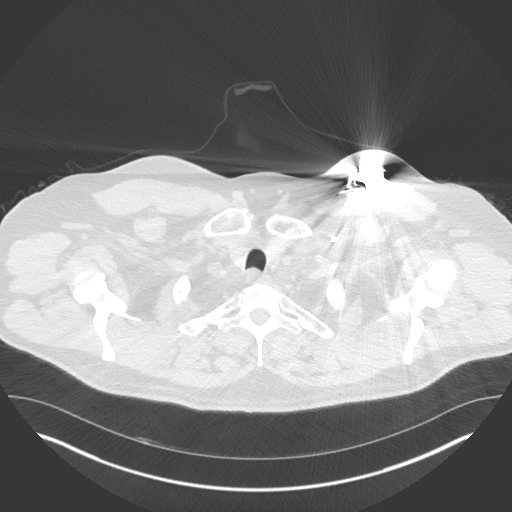

[Series 7: chest w/o 2mm st cor · coronal · non-contrast · 0.56mm/px · 3 of 151 slices shown]
[im 31/151  lung]
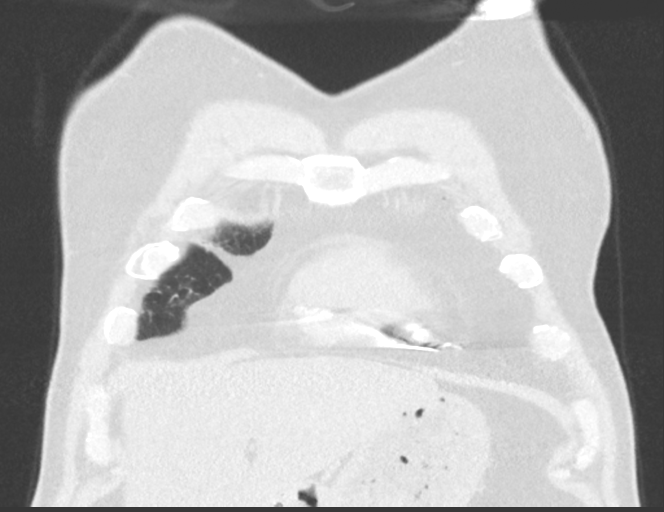
[im 61/151  lung]
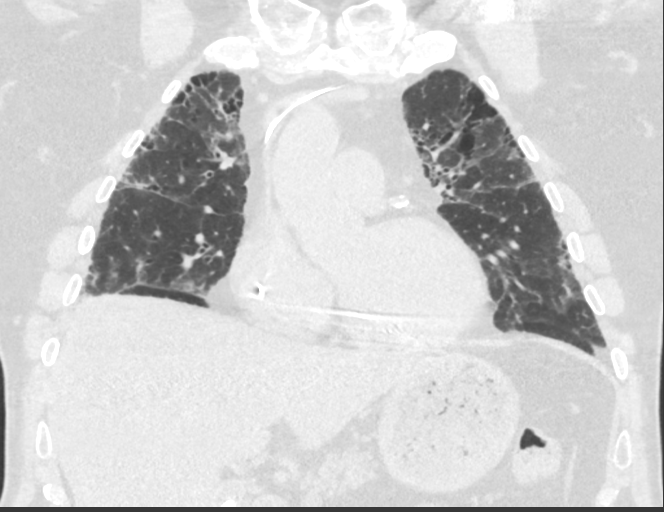
[im 91/151  lung]
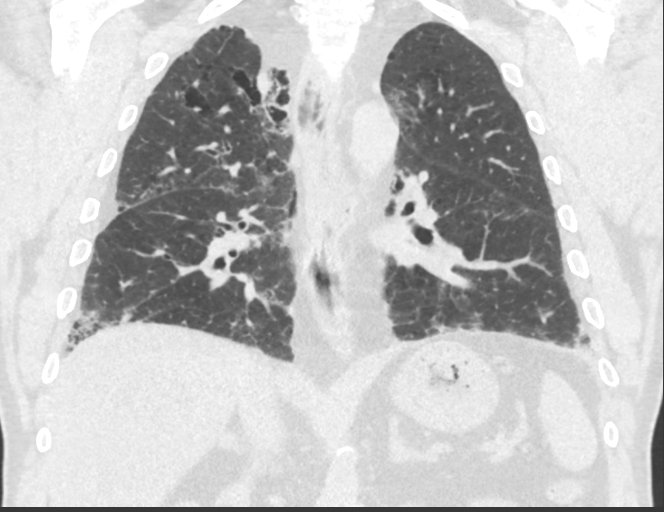

[Series 9: chest w/o bone thins · axial · non-contrast · 0.59mm/px · z∈[+24,+174]mm · 5 of 301 slices shown]
[im 19/301  lung]
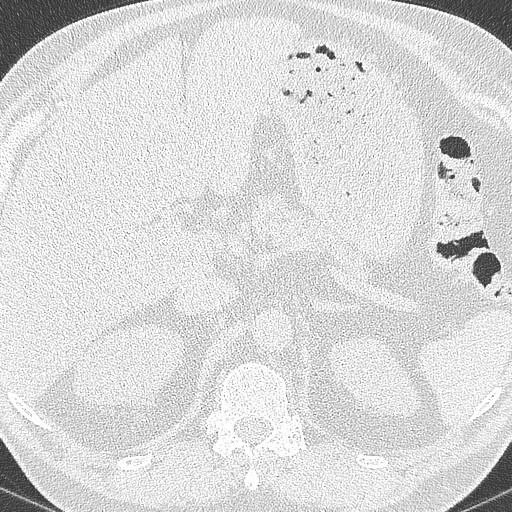
[im 57/301  lung]
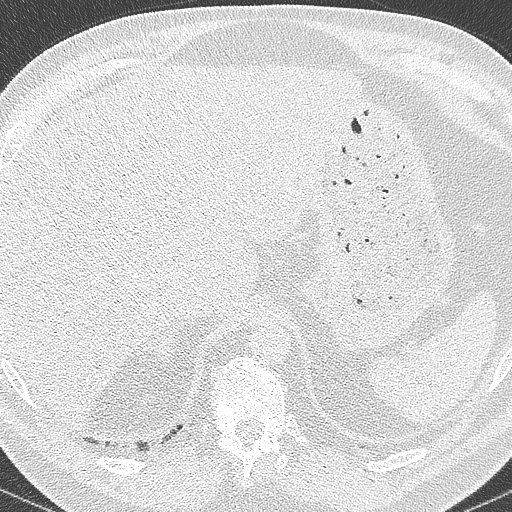
[im 94/301  lung]
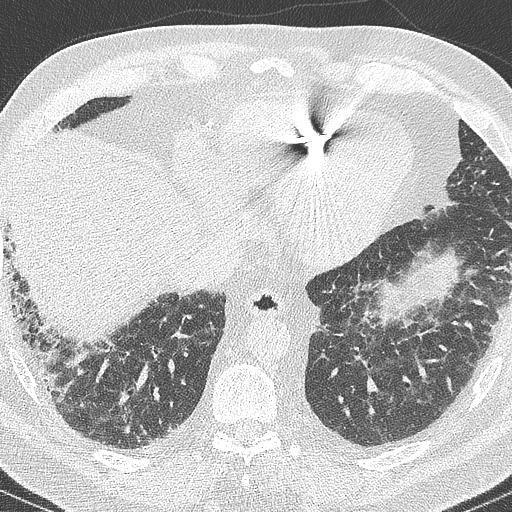
[im 132/301  lung]
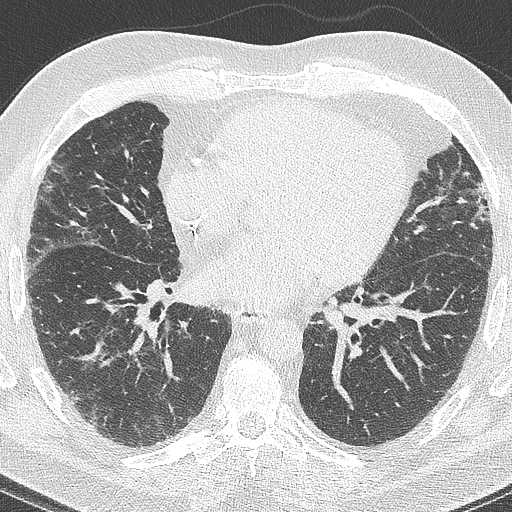
[im 169/301  lung]
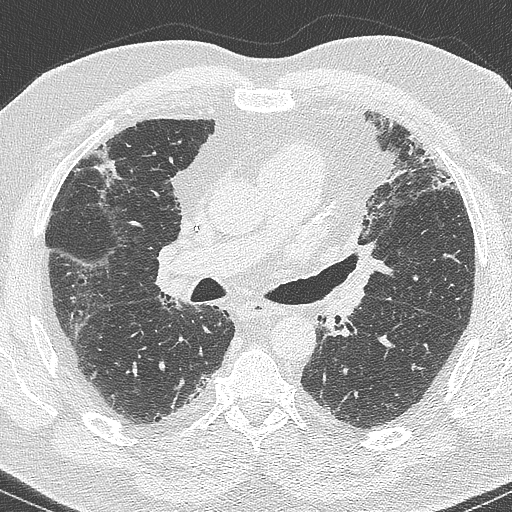

[15 of 36 positions shown; findings below may reference images not displayed]

FINDINGS: Cardiovascular: Normal heart size. No significant pericardial
effusion/thickening. Three-vessel coronary atherosclerosis. Single
lead left subclavian ICD with lead tip in right ventricular apex.
Atherosclerotic nonaneurysmal thoracic aorta. Normal caliber
pulmonary arteries.

Mediastinum/Nodes: No discrete thyroid nodules. Unremarkable
esophagus. No pathologically enlarged axillary, mediastinal or hilar
lymph nodes, noting limited sensitivity for the detection of hilar
adenopathy on this noncontrast study.

Lungs/Pleura: No pneumothorax. No pleural effusion. No acute
consolidative airspace disease, lung masses or significant pulmonary
nodules. Moderate centrilobular and paraseptal emphysema. There is
moderate patchy confluent subpleural reticulation and ground-glass
opacity throughout both lungs with associated mild traction
bronchiectasis and mild architectural distortion. There is a basilar
predominance to these findings. There is mild honeycombing in the
mid to lower lungs bilaterally, most prominent in dependent basilar
right lower lobe. These findings have progressed since 01/04/2019
chest CT. No significant lobular air trapping or evidence of
tracheobronchomalacia on the expiration sequence.

Upper abdomen: Diffuse hepatic steatosis. Cholelithiasis.

Musculoskeletal: No aggressive appearing focal osseous lesions. Mild
thoracic spondylosis.
IMPRESSION: 1. Spectrum of findings compatible with basilar predominant fibrotic
interstitial lung disease with mild honeycombing, with interval
progression since 01/04/2019 chest CT. Findings are consistent with
UIP per consensus guidelines: Diagnosis of Idiopathic Pulmonary
Fibrosis: An Official ATS/ERS/JRS/ALAT Clinical Practice Guideline.
Am J Respir Crit Care Med Vol 198, [place] 5, ppe55-e[DATE].
2. Three-vessel coronary atherosclerosis.
3. Diffuse hepatic steatosis.
4. Cholelithiasis.
5. Aortic Atherosclerosis (CMPXP-ZJ7.7) and Emphysema (CMPXP-NG2.G).

## 2022-01-07 ENCOUNTER — Other Ambulatory Visit: Payer: Self-pay

## 2022-01-07 MED ORDER — CLOPIDOGREL BISULFATE 75 MG PO TABS: 75.0000 mg | ORAL_TABLET | Freq: Every day | ORAL | 3 refills | Status: DC

## 2022-01-13 ENCOUNTER — Inpatient Hospital Stay (HOSPITAL_COMMUNITY)
Admission: EM | Admit: 2022-01-13 | Discharge: 2022-01-16 | DRG: 193 | Disposition: A | Discharge status: Home / Self Care | Payer: Medicare Other | Attending: Internal Medicine | Admitting: Internal Medicine

## 2022-01-13 ENCOUNTER — Other Ambulatory Visit: Payer: Self-pay

## 2022-01-13 ENCOUNTER — Emergency Department (HOSPITAL_COMMUNITY): Payer: Medicare Other

## 2022-01-13 ENCOUNTER — Encounter (HOSPITAL_COMMUNITY): Payer: Self-pay | Admitting: Emergency Medicine

## 2022-01-13 DIAGNOSIS — Z888 Allergy status to other drugs, medicaments and biological substances status: Secondary | ICD-10-CM

## 2022-01-13 DIAGNOSIS — I110 Hypertensive heart disease with heart failure: Secondary | ICD-10-CM | POA: Diagnosis present

## 2022-01-13 DIAGNOSIS — J189 Pneumonia, unspecified organism: Principal | ICD-10-CM | POA: Diagnosis present

## 2022-01-13 DIAGNOSIS — I472 Ventricular tachycardia, unspecified: Secondary | ICD-10-CM | POA: Diagnosis present

## 2022-01-13 DIAGNOSIS — J9601 Acute respiratory failure with hypoxia: Secondary | ICD-10-CM | POA: Diagnosis present

## 2022-01-13 DIAGNOSIS — I5020 Unspecified systolic (congestive) heart failure: Secondary | ICD-10-CM

## 2022-01-13 DIAGNOSIS — E119 Type 2 diabetes mellitus without complications: Secondary | ICD-10-CM | POA: Diagnosis present

## 2022-01-13 DIAGNOSIS — Z7902 Long term (current) use of antithrombotics/antiplatelets: Secondary | ICD-10-CM

## 2022-01-13 DIAGNOSIS — Z8249 Family history of ischemic heart disease and other diseases of the circulatory system: Secondary | ICD-10-CM

## 2022-01-13 DIAGNOSIS — G4733 Obstructive sleep apnea (adult) (pediatric): Secondary | ICD-10-CM | POA: Diagnosis present

## 2022-01-13 DIAGNOSIS — Z66 Do not resuscitate: Secondary | ICD-10-CM | POA: Diagnosis present

## 2022-01-13 DIAGNOSIS — I428 Other cardiomyopathies: Secondary | ICD-10-CM | POA: Diagnosis present

## 2022-01-13 DIAGNOSIS — E785 Hyperlipidemia, unspecified: Secondary | ICD-10-CM | POA: Diagnosis present

## 2022-01-13 DIAGNOSIS — Z885 Allergy status to narcotic agent status: Secondary | ICD-10-CM | POA: Diagnosis not present

## 2022-01-13 DIAGNOSIS — E872 Acidosis, unspecified: Secondary | ICD-10-CM

## 2022-01-13 DIAGNOSIS — Z7984 Long term (current) use of oral hypoglycemic drugs: Secondary | ICD-10-CM

## 2022-01-13 DIAGNOSIS — Z79899 Other long term (current) drug therapy: Secondary | ICD-10-CM | POA: Diagnosis not present

## 2022-01-13 DIAGNOSIS — E669 Obesity, unspecified: Secondary | ICD-10-CM | POA: Diagnosis present

## 2022-01-13 DIAGNOSIS — I2510 Atherosclerotic heart disease of native coronary artery without angina pectoris: Secondary | ICD-10-CM | POA: Diagnosis present

## 2022-01-13 DIAGNOSIS — Z20822 Contact with and (suspected) exposure to covid-19: Secondary | ICD-10-CM | POA: Diagnosis present

## 2022-01-13 DIAGNOSIS — I5022 Chronic systolic (congestive) heart failure: Secondary | ICD-10-CM | POA: Diagnosis present

## 2022-01-13 DIAGNOSIS — J42 Unspecified chronic bronchitis: Secondary | ICD-10-CM | POA: Diagnosis present

## 2022-01-13 DIAGNOSIS — I959 Hypotension, unspecified: Secondary | ICD-10-CM | POA: Diagnosis present

## 2022-01-13 DIAGNOSIS — G2581 Restless legs syndrome: Secondary | ICD-10-CM | POA: Diagnosis present

## 2022-01-13 DIAGNOSIS — Z794 Long term (current) use of insulin: Secondary | ICD-10-CM

## 2022-01-13 DIAGNOSIS — Z7982 Long term (current) use of aspirin: Secondary | ICD-10-CM

## 2022-01-13 DIAGNOSIS — Z91199 Patient's noncompliance with other medical treatment and regimen due to unspecified reason: Secondary | ICD-10-CM

## 2022-01-13 DIAGNOSIS — Z6830 Body mass index (BMI) 30.0-30.9, adult: Secondary | ICD-10-CM

## 2022-01-13 DIAGNOSIS — R6510 Systemic inflammatory response syndrome (SIRS) of non-infectious origin without acute organ dysfunction: Secondary | ICD-10-CM | POA: Diagnosis present

## 2022-01-13 DIAGNOSIS — Z955 Presence of coronary angioplasty implant and graft: Secondary | ICD-10-CM

## 2022-01-13 DIAGNOSIS — Z87891 Personal history of nicotine dependence: Secondary | ICD-10-CM

## 2022-01-13 LAB — RESPIRATORY PANEL BY PCR

## 2022-01-13 LAB — CBC WITH DIFFERENTIAL/PLATELET
Abs Immature Granulocytes: 0.06 10*3/uL (ref 0.00–0.07)
Basophils Absolute: 0.1 10*3/uL (ref 0.0–0.1)
Basophils Relative: 0 %
Eosinophils Absolute: 0 10*3/uL (ref 0.0–0.5)
Eosinophils Relative: 0 %
HCT: 49.5 % (ref 39.0–52.0)
Hemoglobin: 16.8 g/dL (ref 13.0–17.0)
Immature Granulocytes: 1 %
Lymphocytes Relative: 12 %
Lymphs Abs: 1.6 10*3/uL (ref 0.7–4.0)
MCH: 32 pg (ref 26.0–34.0)
MCHC: 33.9 g/dL (ref 30.0–36.0)
MCV: 94.3 fL (ref 80.0–100.0)
Monocytes Absolute: 1.1 10*3/uL — ABNORMAL HIGH (ref 0.1–1.0)
Monocytes Relative: 9 %
Neutro Abs: 9.7 10*3/uL — ABNORMAL HIGH (ref 1.7–7.7)
Neutrophils Relative %: 78 %
Platelets: 120 10*3/uL — ABNORMAL LOW (ref 150–400)
RBC: 5.25 MIL/uL (ref 4.22–5.81)
RDW: 13.4 % (ref 11.5–15.5)
WBC: 12.6 10*3/uL — ABNORMAL HIGH (ref 4.0–10.5)
nRBC: 0 % (ref 0.0–0.2)

## 2022-01-13 LAB — PROTIME-INR
INR: 1.1 (ref 0.8–1.2)
Prothrombin Time: 14.2 seconds (ref 11.4–15.2)

## 2022-01-13 LAB — COMPREHENSIVE METABOLIC PANEL
ALT: 22 U/L (ref 0–44)
AST: 21 U/L (ref 15–41)
Albumin: 3.5 g/dL (ref 3.5–5.0)
Alkaline Phosphatase: 55 U/L (ref 38–126)
Anion gap: 12 (ref 5–15)
BUN: 14 mg/dL (ref 8–23)
CO2: 20 mmol/L — ABNORMAL LOW (ref 22–32)
Calcium: 8.6 mg/dL — ABNORMAL LOW (ref 8.9–10.3)
Chloride: 101 mmol/L (ref 98–111)
Creatinine, Ser: 1.19 mg/dL (ref 0.61–1.24)
GFR, Estimated: >60 mL/min (ref >60)
Glucose, Bld: 148 mg/dL — ABNORMAL HIGH (ref 70–99)
Potassium: 3.8 mmol/L (ref 3.5–5.1)
Sodium: 133 mmol/L — ABNORMAL LOW (ref 135–145)
Total Bilirubin: 1.3 mg/dL — ABNORMAL HIGH (ref 0.3–1.2)
Total Protein: 7.3 g/dL (ref 6.5–8.1)

## 2022-01-13 LAB — SARS CORONAVIRUS 2 BY RT PCR: SARS Coronavirus 2 by RT PCR: NEGATIVE

## 2022-01-13 LAB — URINALYSIS, ROUTINE W REFLEX MICROSCOPIC
Bacteria, UA: NONE SEEN
Bilirubin Urine: NEGATIVE
Glucose, UA: >=500 mg/dL — AB
Hgb urine dipstick: NEGATIVE
Ketones, ur: 5 mg/dL — AB
Leukocytes,Ua: NEGATIVE
Nitrite: NEGATIVE
Protein, ur: NEGATIVE mg/dL
Specific Gravity, Urine: 1.028 (ref 1.005–1.030)
pH: 5 (ref 5.0–8.0)

## 2022-01-13 LAB — BRAIN NATRIURETIC PEPTIDE
B Natriuretic Peptide: 209.8 pg/mL — ABNORMAL HIGH (ref 0.0–100.0)
B Natriuretic Peptide: 84.1 pg/mL (ref 0.0–100.0)

## 2022-01-13 LAB — LACTIC ACID, PLASMA: Lactic Acid, Venous: 1.3 mmol/L (ref 0.5–1.9)

## 2022-01-13 LAB — APTT: aPTT: 33 seconds (ref 24–36)

## 2022-01-13 LAB — TROPONIN I (HIGH SENSITIVITY)
Troponin I (High Sensitivity): 15 ng/L (ref <18)
Troponin I (High Sensitivity): 15 ng/L (ref <18)

## 2022-01-13 MED ORDER — INSULIN GLARGINE (1 UNIT DIAL) 300 UNIT/ML ~~LOC~~ SOPN: 50.0000 [IU] | PEN_INJECTOR | Freq: Every day | SUBCUTANEOUS | Status: DC

## 2022-01-13 MED ORDER — PANTOPRAZOLE SODIUM 40 MG PO TBEC
40.0000 mg | DELAYED_RELEASE_TABLET | Freq: Every evening | ORAL | Status: DC
  Administered 2022-01-13 – 2022-01-15 (×3): 40 mg via ORAL
  Filled 2022-01-13 (×3): qty 1

## 2022-01-13 MED ORDER — SODIUM CHLORIDE 0.9 % IV SOLN
500.0000 mg | INTRAVENOUS | Status: DC
  Administered 2022-01-13 – 2022-01-15 (×3): 500 mg via INTRAVENOUS
  Filled 2022-01-13 (×3): qty 5

## 2022-01-13 MED ORDER — SODIUM CHLORIDE 0.9 % IV SOLN: 500.0000 mg | INTRAVENOUS | Status: DC

## 2022-01-13 MED ORDER — INSULIN GLARGINE-YFGN 100 UNIT/ML ~~LOC~~ SOLN
40.0000 [IU] | Freq: Every day | SUBCUTANEOUS | Status: DC
  Administered 2022-01-13 – 2022-01-15 (×3): 40 [IU] via SUBCUTANEOUS
  Filled 2022-01-13 (×5): qty 0.4

## 2022-01-13 MED ORDER — ALBUTEROL SULFATE (2.5 MG/3ML) 0.083% IN NEBU
2.5000 mg | INHALATION_SOLUTION | Freq: Four times a day (QID) | RESPIRATORY_TRACT | Status: DC
  Administered 2022-01-13 – 2022-01-14 (×4): 2.5 mg via RESPIRATORY_TRACT
  Filled 2022-01-13 (×4): qty 3

## 2022-01-13 MED ORDER — UMECLIDINIUM BROMIDE 62.5 MCG/ACT IN AEPB
1.0000 | INHALATION_SPRAY | Freq: Every day | RESPIRATORY_TRACT | Status: DC
Start: 2022-01-13 — End: 2022-01-16
  Administered 2022-01-15 – 2022-01-16 (×2): 1 via RESPIRATORY_TRACT
  Filled 2022-01-13 (×2): qty 7

## 2022-01-13 MED ORDER — SODIUM CHLORIDE 0.9 % IV SOLN
2.0000 g | INTRAVENOUS | Status: DC
  Administered 2022-01-13 – 2022-01-15 (×3): 2 g via INTRAVENOUS
  Filled 2022-01-13 (×3): qty 20

## 2022-01-13 MED ORDER — IOHEXOL 350 MG/ML SOLN
75.0000 mL | Freq: Once | INTRAVENOUS | Status: AC | PRN
  Administered 2022-01-13: 75 mL via INTRAVENOUS

## 2022-01-13 MED ORDER — INSULIN ASPART 100 UNIT/ML IJ SOLN
5.0000 [IU] | Freq: Three times a day (TID) | INTRAMUSCULAR | Status: DC
  Administered 2022-01-14 – 2022-01-15 (×6): 5 [IU] via SUBCUTANEOUS

## 2022-01-13 MED ORDER — HYDRALAZINE HCL 20 MG/ML IJ SOLN: 5.0000 mg | INTRAMUSCULAR | Status: DC | PRN

## 2022-01-13 MED ORDER — MEXILETINE HCL 150 MG PO CAPS
150.0000 mg | ORAL_CAPSULE | Freq: Two times a day (BID) | ORAL | Status: DC
  Administered 2022-01-13 – 2022-01-16 (×6): 150 mg via ORAL
  Filled 2022-01-13 (×7): qty 1

## 2022-01-13 MED ORDER — IPRATROPIUM BROMIDE 0.02 % IN SOLN
0.5000 mg | Freq: Four times a day (QID) | RESPIRATORY_TRACT | Status: DC
  Administered 2022-01-13 – 2022-01-14 (×5): 0.5 mg via RESPIRATORY_TRACT
  Filled 2022-01-13 (×5): qty 2.5

## 2022-01-13 MED ORDER — GUAIFENESIN ER 600 MG PO TB12
600.0000 mg | ORAL_TABLET | Freq: Two times a day (BID) | ORAL | Status: DC
  Administered 2022-01-13 – 2022-01-16 (×6): 600 mg via ORAL
  Filled 2022-01-13 (×6): qty 1

## 2022-01-13 MED ORDER — SODIUM CHLORIDE 0.9 % IV BOLUS
1000.0000 mL | Freq: Once | INTRAVENOUS | Status: AC
  Administered 2022-01-13: 1000 mL via INTRAVENOUS

## 2022-01-13 MED ORDER — ACETAMINOPHEN 325 MG PO TABS
650.0000 mg | ORAL_TABLET | Freq: Once | ORAL | Status: AC
  Administered 2022-01-13: 650 mg via ORAL
  Filled 2022-01-13: qty 2

## 2022-01-13 MED ORDER — EMPAGLIFLOZIN 25 MG PO TABS
25.0000 mg | ORAL_TABLET | Freq: Every day | ORAL | Status: DC
  Administered 2022-01-14 – 2022-01-16 (×3): 25 mg via ORAL
  Filled 2022-01-13 (×3): qty 1

## 2022-01-13 MED ORDER — ALBUTEROL SULFATE (2.5 MG/3ML) 0.083% IN NEBU: 3.0000 mL | INHALATION_SOLUTION | Freq: Four times a day (QID) | RESPIRATORY_TRACT | Status: DC | PRN

## 2022-01-13 MED ORDER — ASPIRIN 81 MG PO CHEW
81.0000 mg | CHEWABLE_TABLET | Freq: Every day | ORAL | Status: DC
Start: 2022-01-13 — End: 2022-01-13

## 2022-01-13 MED ORDER — SODIUM CHLORIDE 0.9 % IV SOLN: 2.0000 g | INTRAVENOUS | Status: DC

## 2022-01-13 MED ORDER — INSULIN ASPART 100 UNIT/ML FLEXPEN
5.0000 [IU] | PEN_INJECTOR | Freq: Three times a day (TID) | SUBCUTANEOUS | Status: DC
Start: 2022-01-13 — End: 2022-01-13

## 2022-01-13 MED ORDER — HEPARIN SODIUM (PORCINE) 5000 UNIT/ML IJ SOLN
5000.0000 [IU] | Freq: Three times a day (TID) | INTRAMUSCULAR | Status: DC
  Administered 2022-01-13 – 2022-01-16 (×9): 5000 [IU] via SUBCUTANEOUS
  Filled 2022-01-13 (×9): qty 1

## 2022-01-13 MED ORDER — SODIUM CHLORIDE 0.9 % IV SOLN
INTRAVENOUS | Status: DC
Start: 2022-01-13 — End: 2022-01-14

## 2022-01-13 MED ORDER — NITROGLYCERIN 0.4 MG SL SUBL
0.4000 mg | SUBLINGUAL_TABLET | SUBLINGUAL | Status: DC | PRN
Start: 2022-01-13 — End: 2022-01-16

## 2022-01-13 MED ORDER — CLOPIDOGREL BISULFATE 75 MG PO TABS
75.0000 mg | ORAL_TABLET | Freq: Every day | ORAL | Status: DC
  Administered 2022-01-14 – 2022-01-16 (×3): 75 mg via ORAL
  Filled 2022-01-13 (×3): qty 1

## 2022-01-13 MED ORDER — ASPIRIN 81 MG PO TBEC
81.0000 mg | DELAYED_RELEASE_TABLET | Freq: Every day | ORAL | Status: DC
  Administered 2022-01-13 – 2022-01-16 (×4): 81 mg via ORAL
  Filled 2022-01-13 (×4): qty 1

## 2022-01-13 MED ORDER — PRAMIPEXOLE DIHYDROCHLORIDE 1 MG PO TABS
1.0000 mg | ORAL_TABLET | Freq: Three times a day (TID) | ORAL | Status: DC
  Administered 2022-01-13 – 2022-01-16 (×8): 1 mg via ORAL
  Filled 2022-01-13 (×10): qty 1

## 2022-01-13 NOTE — Sepsis Progress Note (Signed)
Sepsis protocol is being monitored by [employer]. 

## 2022-01-13 NOTE — ED Provider Notes (Addendum)
[facility] EMERGENCY DEPARTMENT Provider Note   CSN: [id number] Arrival date & time: 01/13/22  1209     History  Chief Complaint  Patient presents with   Shortness of Breath    [name] is a 66 y.o. male.  The history is provided by the patient.  Shortness of Breath Severity:  Moderate Onset quality:  Gradual Duration:  1 week Timing:  Constant Progression:  Worsening Chronicity:  New Context: URI (cough)   Relieved by:  Nothing Worsened by:  Exertion Associated symptoms: cough   Associated symptoms: no abdominal pain, no chest pain, no claudication, no diaphoresis, no ear pain, no fever, no headaches, no hemoptysis, no sore throat, no sputum production, no syncope, no swollen glands, no vomiting and no wheezing   Risk factors comment:  CHF     Home Medications Prior to Admission medications   Medication Sig Start Date End Date Taking? Authorizing Provider  acetaminophen (TYLENOL) 500 MG tablet Take 500 mg by mouth every 6 (six) hours as needed (for headaches.).    [provider]  albuterol (VENTOLIN HFA) 108 (90 Base) MCG/ACT inhaler Inhale 1 puff into the lungs every 6 (six) hours as needed for wheezing or shortness of breath. 12/26/19   Regalado, Belkys A, MD  Alirocumab (PRALUENT) 75 MG/ML SOAJ ADMINISTER 1 ML UNDER THE SKIN EVERY 14 DAYS 06/24/21   Larey Dresser, MD  aspirin 81 MG chewable tablet Chew 1 tablet (81 mg total) by mouth daily. 01/06/19   Cheryln Manly, NP  BD PEN NEEDLE NANO 2ND GEN 32G X 4 MM MISC See admin instructions. 12/20/20   [provider]  carvedilol (COREG) 12.5 MG tablet Take 1.5 tablets (18.75 mg total) by mouth 2 (two) times daily with a meal. 12/09/21   Bensimhon, Shaune Pascal, MD  CINNAMON PO Take 1,000 mg by mouth 2 (two) times daily.    [provider]  clopidogrel (PLAVIX) 75 MG tablet Take 1 tablet (75 mg total) by mouth daily. 01/07/22   Camnitz, Will Hassell Done, MD  Coenzyme Q10 (CO Q  10) 100 MG CAPS Take 1 tablet by mouth daily.    [provider]  empagliflozin (JARDIANCE) 25 MG TABS tablet Take 25 mg by mouth daily.    [provider]  ENTRESTO 49-51 MG TAKE 1 TABLET BY MOUTH TWICE DAILY 11/04/21   Larey Dresser, MD  furosemide (LASIX) 40 MG tablet Take 1 tablet (40 mg total) by mouth 2 (two) times daily. 12/09/21   Bensimhon, Shaune Pascal, MD  Insulin Glargine (TOUJEO SOLOSTAR [place]) Inject 50 Units into the skin at bedtime.    [provider]  magnesium oxide (MAG-OX) 400 MG tablet Take 400 mg by mouth 2 (two) times daily.    [provider]  mexiletine (MEXITIL) 150 MG capsule TAKE 1 CAPSULE(150 MG) BY MOUTH EVERY 12 HOURS 09/20/21   Larey Dresser, MD  nitroGLYCERIN (NITROSTAT) 0.4 MG SL tablet Place 1 tablet (0.4 mg total) under the tongue every 5 (five) minutes as needed for chest pain. 04/20/15   Minus Breeding, MD  NOVOLOG FLEXPEN 100 UNIT/ML FlexPen Inject 5 Units into the skin 3 (three) times daily. 11/05/20   [provider]  pantoprazole (PROTONIX) 40 MG tablet Take 40 mg by mouth every evening.     [provider]  pramipexole (MIRAPEX) 1 MG tablet Take 1 mg by mouth in the morning and at bedtime.    [provider]  spironolactone (  ALDACTONE) 25 MG tablet Take 1 tablet (25 mg total) by mouth daily. 05/13/21   Larey Dresser, MD      Allergies    Metformin and related, Brilinta [ticagrelor], Codeine, Lipitor [atorvastatin calcium], and Niaspan [niacin er]    Review of Systems   Review of Systems  Constitutional:  Negative for diaphoresis and fever.  HENT:  Negative for ear pain and sore throat.   Respiratory:  Positive for cough and shortness of breath. Negative for hemoptysis, sputum production and wheezing.   Cardiovascular:  Negative for chest pain, claudication and syncope.  Gastrointestinal:  Negative for abdominal pain and vomiting.  Neurological:  Negative for headaches.   Physical  Exam Updated Vital Signs  ED Triage Vitals  Enc Vitals Group     BP 01/13/22 1223 100/66     Pulse Rate 01/13/22 1223 80     Resp 01/13/22 1223 (!) 22     Temp 01/13/22 1223 (!) 100.7 F (38.2 C)     Temp Source 01/13/22 1223 Oral     SpO2 01/13/22 1223 94 %     Weight 01/13/22 1305 208 lb (94.3 kg)     Height 01/13/22 1305 5\' 10"  (1.778 m)     Head Circumference --      Peak Flow --      Pain Score 01/13/22 1217 0     Pain Loc --      Pain Edu? --      Excl. in [place]? --     Physical Exam Vitals and nursing note reviewed.  Constitutional:      General: He is not in acute distress.    Appearance: He is well-developed.  HENT:     Head: Normocephalic and atraumatic.     Mouth/Throat:     Mouth: Mucous membranes are moist.  Eyes:     Conjunctiva/sclera: Conjunctivae normal.     Pupils: Pupils are equal, round, and reactive to light.  Cardiovascular:     Rate and Rhythm: Normal rate and regular rhythm.     Heart sounds: No murmur heard. Pulmonary:     Effort: Pulmonary effort is normal. No respiratory distress.     Comments: Coarse breath sounds Abdominal:     Palpations: Abdomen is soft.     Tenderness: There is no abdominal tenderness.  Musculoskeletal:        General: No swelling.     Cervical back: Normal range of motion and neck supple.  Skin:    General: Skin is warm and dry.     Capillary Refill: Capillary refill takes less than 2 seconds.  Neurological:     Mental Status: He is alert.  Psychiatric:        Mood and Affect: Mood normal.    ED Results / Procedures / Treatments   Labs (all labs ordered are listed, but only abnormal results are displayed) Labs Reviewed  COMPREHENSIVE METABOLIC PANEL - Abnormal; Notable for the following components:      Result Value   Sodium 133 (*)    CO2 20 (*)    Glucose, Bld 148 (*)    Calcium 8.6 (*)    Total Bilirubin 1.3 (*)    All other components within normal limits  CBC WITH DIFFERENTIAL/PLATELET - Abnormal;  Notable for the following components:   WBC 12.6 (*)    Platelets 120 (*)    Neutro Abs 9.7 (*)    Monocytes Absolute 1.1 (*)    All other components within  normal limits  BRAIN NATRIURETIC PEPTIDE - Abnormal; Notable for the following components:   B Natriuretic Peptide 209.8 (*)    All other components within normal limits  SARS CORONAVIRUS 2 BY RT PCR  RESPIRATORY PANEL BY PCR  CULTURE, BLOOD (ROUTINE X 2)  CULTURE, BLOOD (ROUTINE X 2)  URINE CULTURE  LACTIC ACID, PLASMA  PROTIME-INR  APTT  URINALYSIS, ROUTINE W REFLEX MICROSCOPIC  TROPONIN I (HIGH SENSITIVITY)  TROPONIN I (HIGH SENSITIVITY)    EKG EKG Interpretation  Date/Time:  Monday Jan 13 2022 12:19:09 EDT Ventricular Rate:  82 PR Interval:  194 QRS Duration: 116 QT Interval:  372 QTC Calculation: 434 R Axis:   -15 Text Interpretation: Sinus rhythm with occasional Premature ventricular complexes Inferior infarct , age undetermined Abnormal ECG When compared with ECG of 24-Oct-2021 10:55, PREVIOUS ECG IS PRESENT Confirmed by Lennice Sites ([id number]) on 01/13/2022 1:00:41 PM  Radiology CT Angio Chest PE W and/or Wo Contrast  Result Date: 01/13/2022 CLINICAL DATA:  Pulmonary embolism (PE) suspected, high prob. History of interstitial lung disease EXAM: CT ANGIOGRAPHY CHEST WITH CONTRAST TECHNIQUE: Multidetector CT imaging of the chest was performed using the standard protocol during bolus administration of intravenous contrast. Multiplanar CT image reconstructions and MIPs were obtained to evaluate the vascular anatomy. RADIATION DOSE REDUCTION: This exam was performed according to the departmental dose-optimization program which includes automated exposure control, adjustment of the mA and/or kV according to patient size and/or use of iterative reconstruction technique. CONTRAST:  11mL OMNIPAQUE IOHEXOL 350 MG/ML SOLN COMPARISON:  CT 12/13/2019 FINDINGS: Cardiovascular: Satisfactory opacification of the pulmonary arteries to the  segmental level. No evidence of pulmonary embolism. Main pulmonary trunk is mildly dilated. Thoracic aorta is nonaneurysmal. Atherosclerotic calcifications of the aorta and coronary arteries. Cardiomegaly. No pericardial effusion. Mediastinum/Nodes: Enlarged bilateral hilar lymph nodes. Reference nodes include lower right hilar node measuring 1.3 cm (series 7, image 71) and lower right hilar node measuring 1.3 cm (series 7, image 67). Mildly enlarged 1.2 cm upper right paratracheal mediastinal lymph node (series 7, image 22). No axillary lymphadenopathy. Decreased AP diameter of the trachea suggesting tracheomalacia. Esophagus appears within normal limits. Lungs/Pleura: Progression of fibrotic interstitial lung disease with honeycombing. Superimposed focal airspace consolidation within the left lower lobe suspicious for pneumonia. No pleural effusion or pneumothorax. Upper Abdomen: Hepatomegaly. Diffusely decreased attenuation of the hepatic parenchyma. Single gallstone is evident within the gallbladder. No acute findings within the included upper abdomen. Musculoskeletal: No chest wall abnormality. No acute or significant osseous findings. Review of the MIP images confirms the above findings. IMPRESSION: 1. No evidence of pulmonary embolism. 2. Progression of fibrotic interstitial lung disease with honeycombing. 3. Superimposed focal airspace consolidation within the left lower lobe suspicious for pneumonia. Follow-up to resolution is recommended to exclude the possibility of an underlying neoplasm. 4. Enlarged bilateral hilar and mediastinal lymph nodes, likely reactive. 5. Hepatomegaly and hepatic steatosis. 6. Cholelithiasis. Aortic Atherosclerosis (ICD10-I70.0). Electronically Signed   By: Davina Poke D.O.   On: 01/13/2022 15:09   DG Chest Port 1 View  Result Date: 01/13/2022 CLINICAL DATA:  Gradually worsening shortness of breath for the past 5 days. EXAM: PORTABLE CHEST 1 VIEW COMPARISON:  12/23/2019  FINDINGS: Stable enlarged cardiac silhouette. Clear lungs. Diffusely prominent interstitial markings with improvement. Possible small amount of right basilar pleural thickening fluid laterally with improved aeration in that area. IMPRESSION: No acute abnormality. Stable cardiomegaly and mild chronic interstitial lung disease. Electronically Signed   By: Percell Locus.D.  On: 01/13/2022 13:45    Procedures .Critical Care Performed by: Lennice Sites, DO Authorized by: Lennice Sites, DO   Critical care provider statement:    Critical care time (minutes):  40   Critical care was necessary to treat or prevent imminent or life-threatening deterioration of the following conditions:  Respiratory failure and sepsis   Critical care was time spent personally by me on the following activities:  Blood draw for specimens, development of treatment plan with patient or surrogate, discussions with consultants, evaluation of patient's response to treatment, examination of patient, obtaining history from patient or surrogate, interpretation of cardiac output measurements, ordering and performing treatments and interventions, ordering and review of laboratory studies, ordering and review of radiographic studies, pulse oximetry, re-evaluation of patient's condition and review of old charts   Care discussed with: admitting provider      Medications Ordered in ED Medications  cefTRIAXone (ROCEPHIN) 2 g in sodium chloride 0.9 % 100 mL IVPB (0 g Intravenous Stopped 01/13/22 1335)  azithromycin (ZITHROMAX) 500 mg in sodium chloride 0.9 % 250 mL IVPB (500 mg Intravenous New Bag/Given 01/13/22 1340)  aspirin chewable tablet 81 mg (has no administration in time range)  mexiletine (MEXITIL) capsule 150 mg (has no administration in time range)  empagliflozin (JARDIANCE) tablet 25 mg (has no administration in time range)  insulin aspart (NOVOLOG) FlexPen 5 Units (has no administration in time range)  pramipexole (MIRAPEX)  tablet 1 mg (has no administration in time range)  clopidogrel (PLAVIX) tablet 75 mg (has no administration in time range)  pantoprazole (PROTONIX) EC tablet 40 mg (has no administration in time range)  nitroGLYCERIN (NITROSTAT) SL tablet 0.4 mg (has no administration in time range)  acetaminophen (TYLENOL) tablet 650 mg (650 mg Oral Given 01/13/22 1301)  sodium chloride 0.9 % bolus 1,000 mL (1,000 mLs Intravenous New Bag/Given 01/13/22 1438)  iohexol (OMNIPAQUE) 350 MG/ML injection 75 mL (75 mLs Intravenous Contrast Given 01/13/22 1410)    ED Course/ Medical Decision Making/ A&P                           Medical Decision Making Amount and/or Complexity of Data Reviewed Radiology: ordered.  Risk OTC drugs. Prescription drug management. Decision regarding hospitalization.   KEANU VITERI is here with shortness of breath, cough.  Patient arrives hypoxic in the upper 80s improved on 2 L of oxygen.  He is febrile to 100.7.  Blood pressure in the 90s, normal heart rate.  Mildly tachypneic.  Code sepsis initiated upon arrival.  He has a history of heart failure, diabetes, hypertension.  Diagnosed with pulmonary fibrosis that was thought to be secondary to amiodarone.  Patient states he has had progressive shortness of breath over the past week.  Developed chills and body aches yesterday.  Worse today.  Overall coarse breath sounds throughout.  Does not appear to be volume overloaded.  Not having any chest pain.  EKG per my review and interpretation shows sinus rhythm.  No ischemic changes.  Overall differential diagnosis is likely pneumonia versus viral URI versus possibly inflammatory process.  Lower suspicion for ACS, PE, heart failure exacerbation.  We will get CBC, CMP, blood cultures, troponin, BNP, lactic acid, chest x-ray, COVID test.  Already had negative flu test today.  Patient to be given empiric IV antibiotics.  Will give fluid bolus.  Chest x-ray per radiology report did not show any  evidence of pneumonia.  He did have a brief  episode of hypotension that resolved with IV fluids.  This is very short lasting and given his significant heart failure did not think that he would benefit from 30 cc/kg IV fluids.  Blood pressure improved without any IV fluids.  I did pursue a PE work-up with a CT scan which did show a pneumonia which I think is the source for his infection, his hypoxia and his fever.  Lactic acid was normal.  Per further review and interpretation of labs he had negative COVID test.  No significant anemia or electrolyte abnormality otherwise.  Mild leukocytosis.  To be admitted to medicine for further sepsis care.  This chart was dictated using voice recognition software.  Despite best efforts to proofread,  errors can occur which can change the documentation meaning.         Final Clinical Impression(s) / ED Diagnoses Final diagnoses:  Acute respiratory failure with hypoxia [facility])  Community acquired pneumonia, unspecified laterality    Rx / DC Orders ED Discharge Orders     None         Lennice Sites, DO 01/13/22 [place], [place], DO 01/13/22 1533

## 2022-01-13 NOTE — H&P (Addendum)
HPI  [name] [id number] DOB: 07-Apr-1956 DOA: 01/13/2022  PCP: [name], FNP   Chief Complaint: Shortness of breath  HPI:  65.male known ischemic cardiomyopathy followed by Dr. Percival Spanish Dr. Pilar Plate underlying CAD status post cath Rx DES circumflex stenosis Medtronic PPM ICD 07/19/2018 HF R EF 35-40% baseline weight ~ 204 lb OSA--Chronic bronchitis?  Quit smok 2007 ILD [significant asbestos exposure in the 1970s] follows with Dr. 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 Curt Bears 10/08/2021 no changes to meds  At baseline patient can function fairly well and walks about 100 feet on a flat surface can only walk about 3-4 stairs at a time without feeling winded--whenever he bends over he feels a little bit winded and short of breath and follows quite closely with heart failure team Started feeling somewhat ill about a week ago with shortness of breath more so than his usual No positional variation to it and was still sleeping in his usual space in place without any change in pillow orthopnea Developed a fever 01/13/2022 went to urgent care in [place] where he lives-urgent care did 2 COVID tests 1 flu test which were both negative and because his sats were 89% sent him to the emergency room at [facility] He has not followed with pulmonology although he is supposed to and was recently started on Trelegy-his wife wonders if there is some sort of relationship between him getting sick and starting the Trelegy a month ago In the emergency room he developed low blood pressures into the 0000000 systolic and was hypoxic and placed on oxygen He was worked up by the emergency room as below  Now he feels better he has no overt chest pain he is a little less sleepy and drowsy according to his family he was in the room that was with him earlier today   Review of Systems:     Pertinent +'s: Cough + sputum + shortness of breath + fever at home and fever here about 100 degrees Pertinent -"s: Dark stool tarry stool melena hematemesis unilateral weakness blurred vision double vision sore throat sick contacts small children with illness rash chest pain dysuria diarrhea and except as stated above all is negative  ED Course: Worked up given azithromycin and ceftriaxone given a bolus of IV fluids 500 cc Labs obtained COVID-negative placed on oxygen   Past Medical History:  Diagnosis Date   ASCVD (arteriosclerotic cardiovascular disease)    a. MI 2004, did not seek care at that time. b. s/p PTCA/BMS to OM1 04/2011 (known totally occluded RCA with L-R collaterals and failed angioplasty on that vessel). c.)  Cath 2019 chronically occluded right coronary artery with collateralization.  90% circumflex stenosis treated with a DES.   CHF (congestive heart failure) (HCC)    Chronic bronchitis (HCC)    HTN (hypertension)    Hyperlipidemia    Ischemic cardiomyopathy    EF 30%.     OSA (obstructive sleep apnea) 05/24/11   Restless leg syndrome 06/25/2011   Type 2 diabetes mellitus [facility])    Past Surgical History:  Procedure Laterality Date   CARDIAC CATHETERIZATION  11/24/2012   40-50% ostial LAD, 40% mid LAD, 60-70% ostial D1, 40% proximal LCx, patent OM branch stent, 99% serial proximal-mid RCA lesions, 100% mid RCA occlusion (chronic with left to right collateralization); EF 40%, inferior wall hypokinesis.   CARDIAC CATHETERIZATION N/A 05/31/2015  Procedure: Left Heart Cath and Coronary Angiography;  Surgeon: Burnell Blanks, MD;  Location: [place] [facility] LAB;  Service: Cardiovascular;  Laterality: N/A;   CORONARY STENT INTERVENTION N/A 12/25/2017   Procedure: CORONARY STENT INTERVENTION;  Surgeon: Martinique, Peter M, MD;  Location: [place] [facility];  Service: Cardiovascular;  Laterality: N/A;   CORONARY STENT PLACEMENT  04/2011   CYSTECTOMY     Spine and jaw    ICD IMPLANT N/A 07/19/2018   Procedure: ICD IMPLANT;  Surgeon: Constance Haw, MD;  Location: [place] [facility];  Service: Cardiovascular;  Laterality: N/A;   LEFT HEART CATH AND CORONARY ANGIOGRAPHY N/A 12/25/2017   Procedure: LEFT HEART CATH AND CORONARY ANGIOGRAPHY;  Surgeon: Martinique, Peter M, MD;  Location: [place] [facility];  Service: Cardiovascular;  Laterality: N/A;   LEFT HEART CATHETERIZATION WITH CORONARY ANGIOGRAM N/A 11/24/2012   Procedure: LEFT HEART CATHETERIZATION WITH CORONARY ANGIOGRAM;  Surgeon: Burnell Blanks, MD;  Location: [facility] C[facility] LAB;  Service: Cardiovascular;  Laterality: N/A;   lesion removed from foot     right foot   RIGHT/LEFT HEART CATH AND CORONARY ANGIOGRAPHY N/A 01/06/2019   Procedure: RIGHT/LEFT HEART CATH AND CORONARY ANGIOGRAPHY;  Surgeon: Lorretta Harp, MD;  Location: [place] [facility] LAB;  Service: Cardiovascular;  Laterality: N/A;   TONSILLECTOMY AND ADENOIDECTOMY      reports that he quit smoking about 16 years ago. His smoking use included cigarettes. He has a 52.50 pack-year smoking history. He has never used smokeless tobacco. He reports that he does not drink alcohol and does not use drugs.  Mobility: Pretty independent as dictated above-although is limited-he is supposed to be wearing oxygen since last hospitalization but feels a little bit shy to do so was not able to get a portable concentrator according to wife who is interested in getting a small canister Lives with wife of 26 years (second marriage) adult daughters in the room as well Currently does not smoke nor drink  Allergies  Allergen Reactions   Metformin And Related     SOB   Brilinta [Ticagrelor] Other (See Comments)    Intolerable dyspnea-changed to Plavix   Codeine Other (See Comments)    Lightheaded, vision issues   Lipitor [Atorvastatin Calcium] Other (See Comments)    Muscle pain   Niaspan [Niacin Er] Other (See Comments)    Head hurts   Family History  Problem  Relation Age of Onset   Lymphoma Mother    Heart attack Father 43   Heart attack Brother 75   Prior to Admission medications   Medication Sig Start Date End Date Taking? Authorizing Provider  acetaminophen (TYLENOL) 500 MG tablet Take 500 mg by mouth every 6 (six) hours as needed (for headaches.).    [provider]  albuterol (VENTOLIN HFA) 108 (90 Base) MCG/ACT inhaler Inhale 1 puff into the lungs every 6 (six) hours as needed for wheezing or shortness of breath. 12/26/19   Regalado, Belkys A, MD  Alirocumab (PRALUENT) 75 MG/ML SOAJ ADMINISTER 1 ML UNDER THE SKIN EVERY 14 DAYS 06/24/21   Larey Dresser, MD  aspirin 81 MG chewable tablet Chew 1 tablet (81 mg total) by mouth daily. 01/06/19   Cheryln Manly, NP  BD PEN NEEDLE NANO 2ND GEN 32G X 4 MM MISC See admin instructions. 12/20/20   [provider]  carvedilol (COREG) 12.5 MG tablet Take 1.5 tablets (18.75 mg total) by mouth 2 (two) times daily with a meal. 12/09/21  Bensimhon, Shaune Pascal, MD  CINNAMON PO Take 1,000 mg by mouth 2 (two) times daily.    [provider]  clopidogrel (PLAVIX) 75 MG tablet Take 1 tablet (75 mg total) by mouth daily. 01/07/22   Camnitz, Will Hassell Done, MD  Coenzyme Q10 (CO Q 10) 100 MG CAPS Take 1 tablet by mouth daily.    [provider]  empagliflozin (JARDIANCE) 25 MG TABS tablet Take 25 mg by mouth daily.    [provider]  ENTRESTO 49-51 MG TAKE 1 TABLET BY MOUTH TWICE DAILY 11/04/21   Larey Dresser, MD  furosemide (LASIX) 40 MG tablet Take 1 tablet (40 mg total) by mouth 2 (two) times daily. 12/09/21   Bensimhon, Shaune Pascal, MD  Insulin Glargine (TOUJEO SOLOSTAR [place]) Inject 50 Units into the skin at bedtime.    [provider]  magnesium oxide (MAG-OX) 400 MG tablet Take 400 mg by mouth 2 (two) times daily.    [provider]  mexiletine (MEXITIL) 150 MG capsule TAKE 1 CAPSULE(150 MG) BY MOUTH EVERY 12 HOURS 09/20/21   Larey Dresser, MD   nitroGLYCERIN (NITROSTAT) 0.4 MG SL tablet Place 1 tablet (0.4 mg total) under the tongue every 5 (five) minutes as needed for chest pain. 04/20/15   Minus Breeding, MD  NOVOLOG FLEXPEN 100 UNIT/ML FlexPen Inject 5 Units into the skin 3 (three) times daily. 11/05/20   [provider]  pantoprazole (PROTONIX) 40 MG tablet Take 40 mg by mouth every evening.     [provider]  pramipexole (MIRAPEX) 1 MG tablet Take 1 mg by mouth in the morning and at bedtime.    [provider]  spironolactone (ALDACTONE) 25 MG tablet Take 1 tablet (25 mg total) by mouth daily. 05/13/21   Larey Dresser, MD    Physical Exam:  Vitals:   01/13/22 1405 01/13/22 1439  BP: 103/67 98/60  Pulse: 77 75  Resp: 18 (!) 24  Temp:    SpO2: 92% 95%    Thick neck Mallampati 4 cannot appreciate JVD no icterus no pallor no bruit no other submandibular lymphadenopathy No thyromegaly S1-S2 sinus rhythm RRR Mild crackles posterolaterally but no wheeze no rales Abdomen is obese he has a midline periumbilical hernia no HSM no CVA tenderness Neuro is intact moving 4 limbs equally power 5/5 no overt focal deficit rest deferred No joint swellings or limitation to ROM No lower extremity edema  I have personally reviewed following labs and imaging studies  Labs:  Sodium 133 BUNs/creatinine 14/1.19 calcium 8.6 BNP 209 Bicarb 20 WBC 12.6 platelet 120 hemoglobin 16.8 predominant neutrophilia 78% INR 1.1  Imaging studies:  Chest x-ray showed no acute abnormality \ CT chest-my over read shows posterior lateral left lower lung fields opacities in the setting of honeycombing based on image below   Medical tests:  EKG independently reviewed: My over read shows sinus rhythm  no ST-T wave changes PVC noted-overall normal and consistent with prior  Test discussed with performing physician: Discussed with Dr. Ronnald Nian  Decision to obtain old records:  Yes  Review and summation of old records:   Yes  Principal Problem:   PNA (pneumonia) Active Problems:   HFrEF (heart failure with reduced ejection fraction) (HCC)   Metabolic acidosis   Assessment/Plan SIRS criteria without overt sepsis secondary to community-acquired pneumonia Does not typically cough when he eats-third "pneumonia since November 2022" Has not followed pulmonology Continue empiric azithromycin and ceftriaxone-on review of CT myself he does have  some progression of his ILD as per below Because he was hypotensive initially we have adjusted his cardiac regimen as per below OSA noncompliant on CPAP-"" feels smothered" has not used COPD stage C Gold not on oxygen but supposed to be does not use Underlying ILD/asbestosis-quit smoking 2007 He will need oxygen on discharge home this time around I think-we will see if pulmonology can obtain a nighttime pulse ox overnight and qualify him On review of his CT scan he does have honeycombing and I do think that he has some element of progression of fibrosis since prior he has not been seen since 2021 by Dr. Vaughan Browner in his office he was supposed to have started [place] but this did not happen His family feels like they want a follow-up in [place] for his pulmonology care-we will attempt to coordinate the same they are aware that they may have to do this to the primary care physician Can resume albuterol-I do not think he has a COPD flare therefore we will hold any steroids at this time and he will be on antibiotics as dictated above Nonischemic cardiomyopathy-last EF 35-40% 01/21/2021 VT status post initiation of amiodarone now on mexiletine HF R EF closely followed by Dr. Algernon Huxley of cardiology--- describes NYHA class II symptoms at baseline currently class IV For today I have held the patient's diuretics, Entresto, Aldactone from prior to admission he may be able to resume his Coreg in the morning in addition he was hypotensive on admission He can continue Plavix 75 daily in  addition to aspirin 81 daily at this time DM TY 2 Resume Lantus 50 units at bedtime, 3 times daily insulin as per his home regimen and adjust as needed   Severity of Illness: The appropriate patient status for this patient is INPATIENT. Inpatient status is judged to be reasonable and necessary in order to provide the required intensity of service to ensure the patient's safety. The patient's presenting symptoms, physical exam findings, and initial radiographic and laboratory data in the context of their chronic comorbidities is felt to place them at high risk for further clinical deterioration. Furthermore, it is not anticipated that the patient will be medically stable for discharge from the hospital within 2 midnights of admission.   * I certify that at the point of admission it is my clinical judgment that the patient will require inpatient hospital care spanning beyond 2 midnights from the point of admission due to high intensity of service, high risk for further deterioration and high frequency of surveillance required.*   DVT prophylaxis: Lovenox Code Status: DNR Family Communication: Discussed with wife at the bedside and daughter in detail and they understand the plan of care Consults called: I will let advanced heart failure team know that he is here-do not formally think they need to see  Time spent: 48 minutes  Verlon Au, MD Jerl Mina my NP partners at night for Care related issues] [facility] --Via amion app OR , [email or web address]; password [facility]  01/13/2022, 4:04 PM

## 2022-01-13 NOTE — ED Triage Notes (Signed)
BIB [facility] from urgent care, complaint of shortness of breath. 98% 8L NRB via EMS 18G RAC, duoneb via EMS.

## 2022-01-13 NOTE — Hospital Course (Addendum)
  65.male known ischemic cardiomyopathy followed by Dr. Percival Spanish Dr. Pilar Plate underlying CAD status post cath Rx DES circumflex stenosis Medtronic PPM ICD 07/19/2018 HF R EF 35-40% baseline weight ~ 204 lb OSA--Chronic bronchitis?  Quit smok 2007 ILD [significant asbestos exposure in the 1970s] follows with Dr. 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 Curt Bears 10/08/2021 no changes to meds

## 2022-01-13 NOTE — ED Provider Triage Note (Signed)
Emergency Medicine Provider Triage Evaluation Note  [name] , a 66 y.o. male  was evaluated in triage.  Pt complains of shortness of breath gradually worsening for the past 5 days.  He denies any cough, nasal congestion, rhinorrhea, or fever.  Denies any chest pain or abdominal pain.  Denies any nausea or vomiting.  He presented to urgent care and was placed on 8 L nasal cannula satting at 98%.  Unknown of his initial sat...  Review of Systems  Positive:  Negative:   Physical Exam  BP 100/66 (BP Location: Right Arm)   Pulse 80   Temp (!) 100.7 F (38.2 C) (Oral)   Resp (!) 22   SpO2 94%  Gen:   Awake, no distress   Resp:  Tachypnea, unable to speak full sentences.  Satting 98% on room air.  Rhonchorous sounds to bilateral lungs. MSK:   Moves extremities without difficulty  Other:  Patient has a distended abdomen although he reports it does not look any different to him, question fluid overload.  No edema noted to the legs.  Medical Decision Making  Medically screening exam initiated at 12:24 PM.  Appropriate orders placed.  CEDRICK PARTAIN was informed that the remainder of the evaluation will be completed by another provider, this initial triage assessment does not replace that evaluation, and the importance of remaining in the ED until their evaluation is complete.  Given the patient's new oxygen requirement as well as his fever, I have started the initial evolving sepsis orders.  Patient is being room to room 19 currently.   Achille Rich, PA-C 01/13/22 1228

## 2022-01-14 ENCOUNTER — Inpatient Hospital Stay (HOSPITAL_COMMUNITY): Payer: Medicare Other

## 2022-01-14 DIAGNOSIS — I5022 Chronic systolic (congestive) heart failure: Secondary | ICD-10-CM | POA: Diagnosis not present

## 2022-01-14 DIAGNOSIS — J189 Pneumonia, unspecified organism: Secondary | ICD-10-CM | POA: Diagnosis not present

## 2022-01-14 LAB — ECHOCARDIOGRAM COMPLETE
AR max vel: 2.42 cm2
AV Area VTI: 2.27 cm2
AV Area mean vel: 2.14 cm2
AV Mean grad: 7 mmHg
AV Peak grad: 11.2 mmHg
Ao pk vel: 1.67 m/s
Area-P 1/2: 4.39 cm2
Calc EF: 34.4 %
Height: 70 in
S' Lateral: 4.8 cm
Single Plane A2C EF: 27.1 %
Single Plane A4C EF: 34.2 %
Weight: 3414.48 oz

## 2022-01-14 LAB — COMPREHENSIVE METABOLIC PANEL
ALT: 20 U/L (ref 0–44)
AST: 19 U/L (ref 15–41)
Albumin: 3 g/dL — ABNORMAL LOW (ref 3.5–5.0)
Alkaline Phosphatase: 46 U/L (ref 38–126)
Anion gap: 10 (ref 5–15)
BUN: 12 mg/dL (ref 8–23)
CO2: 19 mmol/L — ABNORMAL LOW (ref 22–32)
Calcium: 8.1 mg/dL — ABNORMAL LOW (ref 8.9–10.3)
Chloride: 105 mmol/L (ref 98–111)
Creatinine, Ser: 1.22 mg/dL (ref 0.61–1.24)
GFR, Estimated: >60 mL/min (ref >60)
Glucose, Bld: 89 mg/dL (ref 70–99)
Potassium: 3.9 mmol/L (ref 3.5–5.1)
Sodium: 134 mmol/L — ABNORMAL LOW (ref 135–145)
Total Bilirubin: 1.1 mg/dL (ref 0.3–1.2)
Total Protein: 6.4 g/dL — ABNORMAL LOW (ref 6.5–8.1)

## 2022-01-14 LAB — MAGNESIUM: Magnesium: 1.9 mg/dL (ref 1.7–2.4)

## 2022-01-14 LAB — URINE CULTURE: Culture: NO GROWTH

## 2022-01-14 LAB — CBC
HCT: 45 % (ref 39.0–52.0)
Hemoglobin: 15.4 g/dL (ref 13.0–17.0)
MCH: 31.8 pg (ref 26.0–34.0)
MCHC: 34.2 g/dL (ref 30.0–36.0)
MCV: 92.8 fL (ref 80.0–100.0)
Platelets: 104 10*3/uL — ABNORMAL LOW (ref 150–400)
RBC: 4.85 MIL/uL (ref 4.22–5.81)
RDW: 13.2 % (ref 11.5–15.5)
WBC: 11 10*3/uL — ABNORMAL HIGH (ref 4.0–10.5)
nRBC: 0 % (ref 0.0–0.2)

## 2022-01-14 LAB — PROTIME-INR
INR: 1.1 (ref 0.8–1.2)
Prothrombin Time: 14.3 seconds (ref 11.4–15.2)

## 2022-01-14 MED ORDER — CARVEDILOL 12.5 MG PO TABS
12.5000 mg | ORAL_TABLET | Freq: Two times a day (BID) | ORAL | Status: DC
  Administered 2022-01-14 – 2022-01-16 (×4): 12.5 mg via ORAL
  Filled 2022-01-14 (×4): qty 1

## 2022-01-14 MED ORDER — ACETAMINOPHEN 500 MG PO TABS
500.0000 mg | ORAL_TABLET | Freq: Four times a day (QID) | ORAL | Status: DC | PRN
  Administered 2022-01-14 – 2022-01-16 (×2): 500 mg via ORAL
  Filled 2022-01-14 (×2): qty 1

## 2022-01-14 MED ORDER — IPRATROPIUM BROMIDE 0.02 % IN SOLN: 0.5000 mg | Freq: Three times a day (TID) | RESPIRATORY_TRACT | Status: DC

## 2022-01-14 NOTE — Progress Notes (Signed)
Heart Failure Navigator Progress Note  Assessed for Heart & Vascular TOC clinic readiness.  Patient does not meet criteria due to being an est patient with [facility] (Dr Shirlee Latch).   Navigator available for reassessment of patient.   Meredith Staggers, RN, BSN, [facility] Heart Failure Navigator Heart & Vascular Care Navigation Team

## 2022-01-14 NOTE — Progress Notes (Signed)
  Echocardiogram 2D Echocardiogram has been performed.  [name] 01/14/2022, 10:46 AM

## 2022-01-14 NOTE — Progress Notes (Signed)
PROGRESS NOTE   [name]  FUX:[id number] DOB: 1956-04-23 DOA: 01/13/2022 PCP: Jerrye Bushy, FNP  Brief Narrative:  49 M known ischemic cardiomyopathy followed by Dr. Antoine Poche Dr. Garfield Cornea underlying CAD status post cath Rx DES circumflex stenosis Med[employer] PPM ICD 07/19/2018 HF R EF 35-40% baseline weight ~ 204 lb OSA--Chronic bronchitis?  Quit smok 2007 ILD [significant asbestos exposure in the 1970s] follows with Dr. 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 Elberta Fortis 10/08/2021 no changes to meds  Hospital-Problem based course  SIRS criteria without overt sepsis secondary to community-acquired pneumonia Does not typically cough when he eats-third "pneumonia since November 2022" Continue IV abx for now--WBC better Saline lock this pm OSA noncompliant on CPAP-"" feels smothered" has not used COPD stage C Gold not on oxygen but supposed to be does not use Underlying ILD/asbestosis-quit smoking 2007 not COPD flar, no steroids--cont Albuterol, inhalers Will ask Pulm to help set-up for ILD followup and rec's in-patient Nonischemic cardiomyopathy-last EF 35-40% 01/21/2021 VT status post initiation of amiodarone now on mexiletine HF R EF closely followed by Dr. Jearld Pies of cardiology--- describes NYHA class II symptoms at baseline currently class IV Continue Mexiletine 150 q 12--- Resume Coreg but at 12.5 bid  [resume aldactone/entresto maybe in am]   Hold lasix 40 bi He can continue Plavix 75 daily in addition to aspirin 81 daily at this time BNP unreliable and was probably elevated from sepsis DM TY 2 Resume Lantus 50 units at bedtime, 3 times daily insulin as per his home regimen CBG 90-150  DVT prophylaxis: heparin Code Status: Full Family Communication: wife at bedside Disposition:  Status is: Inpatient Remains inpatient appropriate because: not ready for d/c    Consultants:  n  Procedures: n  Antimicrobials:     Subjective: Fair no fever Overall looks improved Has HA this am---now better with tylenol Slept well Some slight ^ in O2 req Is +1.7 liter   Objective: Vitals:   01/14/22 0845 01/14/22 1123 01/14/22 1141 01/14/22 1208  BP: 116/60 126/63    Pulse: 75 76    Resp: 20 19    Temp: 98 F (36.7 C) 97.9 F (36.6 C) 98.2 F (36.8 C)   TempSrc: Oral Oral    SpO2: 92% 92% 93% 94%  Weight:      Height:        Intake/Output Summary (Last 24 hours) at 01/14/2022 1335 Last data filed at 01/14/2022 1310 Gross per 24 hour  Intake 1973.29 ml  Output 575 ml  Net 1398.29 ml   Filed Weights   01/13/22 1305 01/13/22 1830 01/14/22 0004  Weight: 94.3 kg 96.8 kg 96.8 kg    Examination:  Eomi ncat no focal deficit On nasal canula S1 s2 no m/r/g--sinus Abd soft nt nd Cta b some rales rhonchi Abd soft  No le edema Neuro intact  Data Reviewed: personally reviewed   CBC    Component Value Date/Time   WBC 11.0 (H) 01/14/2022 0335   RBC 4.85 01/14/2022 0335   HGB 15.4 01/14/2022 0335   HGB 16.5 03/31/2019 1557   HCT 45.0 01/14/2022 0335   HCT 47.0 03/31/2019 1557   PLT 104 (L) 01/14/2022 0335   PLT 146 (L) 03/31/2019 1557   MCV 92.8 01/14/2022 0335   MCV 93 03/31/2019 1557   MCH 31.8 01/14/2022 0335   MCHC 34.2 01/14/2022 0335   RDW 13.2 01/14/2022 0335  RDW 13.6 03/31/2019 1557   LYMPHSABS 1.6 01/13/2022 1240   LYMPHSABS 3.5 (H) 03/31/2019 1557   MONOABS 1.1 (H) 01/13/2022 1240   EOSABS 0.0 01/13/2022 1240   EOSABS 0.3 03/31/2019 1557   BASOSABS 0.1 01/13/2022 1240   BASOSABS 0.1 03/31/2019 1557      Latest Ref Rng & Units 01/14/2022    3:35 AM 01/13/2022   12:40 PM 10/24/2021   11:49 AM  CMP  Glucose 70 - 99 mg/dL 89   148   158    BUN 8 - 23 mg/dL 12   14   14     Creatinine 0.61 - 1.24 mg/dL 1.22   1.19   0.85    Sodium 135 - 145 mmol/L 134   133   134    Potassium 3.5 - 5.1 mmol/L 3.9   3.8   3.6     Chloride 98 - 111 mmol/L 105   101   102    CO2 22 - 32 mmol/L 19   20   21     Calcium 8.9 - 10.3 mg/dL 8.1   8.6   9.0    Total Protein 6.5 - 8.1 g/dL 6.4   7.3     Total Bilirubin 0.3 - 1.2 mg/dL 1.1   1.3     Alkaline Phos 38 - 126 U/L 46   55     AST 15 - 41 U/L 19   21     ALT 0 - 44 U/L 20   22        Radiology Studies: CT Angio Chest PE W and/or Wo Contrast  Result Date: 01/13/2022 CLINICAL DATA:  Pulmonary embolism (PE) suspected, high prob. History of interstitial lung disease EXAM: CT ANGIOGRAPHY CHEST WITH CONTRAST TECHNIQUE: Multidetector CT imaging of the chest was performed using the standard protocol during bolus administration of intravenous contrast. Multiplanar CT image reconstructions and MIPs were obtained to evaluate the vascular anatomy. RADIATION DOSE REDUCTION: This exam was performed according to the departmental dose-optimization program which includes automated exposure control, adjustment of the mA and/or kV according to patient size and/or use of iterative reconstruction technique. CONTRAST:  9mL OMNIPAQUE IOHEXOL 350 MG/ML SOLN COMPARISON:  CT 12/13/2019 FINDINGS: Cardiovascular: Satisfactory opacification of the pulmonary arteries to the segmental level. No evidence of pulmonary embolism. Main pulmonary trunk is mildly dilated. Thoracic aorta is nonaneurysmal. Atherosclerotic calcifications of the aorta and coronary arteries. Cardiomegaly. No pericardial effusion. Mediastinum/Nodes: Enlarged bilateral hilar lymph nodes. Reference nodes include lower right hilar node measuring 1.3 cm (series 7, image 71) and lower right hilar node measuring 1.3 cm (series 7, image 67). Mildly enlarged 1.2 cm upper right paratracheal mediastinal lymph node (series 7, image 22). No axillary lymphadenopathy. Decreased AP diameter of the trachea suggesting tracheomalacia. Esophagus appears within normal limits. Lungs/Pleura: Progression of fibrotic interstitial lung disease with  honeycombing. Superimposed focal airspace consolidation within the left lower lobe suspicious for pneumonia. No pleural effusion or pneumothorax. Upper Abdomen: Hepatomegaly. Diffusely decreased attenuation of the hepatic parenchyma. Single gallstone is evident within the gallbladder. No acute findings within the included upper abdomen. Musculoskeletal: No chest wall abnormality. No acute or significant osseous findings. Review of the MIP images confirms the above findings. IMPRESSION: 1. No evidence of pulmonary embolism. 2. Progression of fibrotic interstitial lung disease with honeycombing. 3. Superimposed focal airspace consolidation within the left lower lobe suspicious for pneumonia. Follow-up to resolution is recommended to exclude the possibility of an underlying neoplasm. 4. Enlarged bilateral hilar  and mediastinal lymph nodes, likely reactive. 5. Hepatomegaly and hepatic steatosis. 6. Cholelithiasis. Aortic Atherosclerosis (ICD10-I70.0). Electronically Signed   By: Davina Poke D.O.   On: 01/13/2022 15:09   DG Chest Port 1 View  Result Date: 01/13/2022 CLINICAL DATA:  Gradually worsening shortness of breath for the past 5 days. EXAM: PORTABLE CHEST 1 VIEW COMPARISON:  12/23/2019 FINDINGS: Stable enlarged cardiac silhouette. Clear lungs. Diffusely prominent interstitial markings with improvement. Possible small amount of right basilar pleural thickening fluid laterally with improved aeration in that area. IMPRESSION: No acute abnormality. Stable cardiomegaly and mild chronic interstitial lung disease. Electronically Signed   By: Claudie Revering M.D.   On: 01/13/2022 13:45     Scheduled Meds:  albuterol  2.5 mg Nebulization Q6H   aspirin EC  81 mg Oral Daily   clopidogrel  75 mg Oral Daily   empagliflozin  25 mg Oral Daily   guaiFENesin  600 mg Oral BID   heparin  5,000 Units Subcutaneous Q8H   insulin aspart  5 Units Subcutaneous TID WC   insulin glargine-yfgn  40 Units Subcutaneous QHS    ipratropium  0.5 mg Nebulization Q6H   mexiletine  150 mg Oral Q12H   pantoprazole  40 mg Oral QPM   pramipexole  1 mg Oral TID   umeclidinium bromide  1 puff Inhalation Daily   Continuous Infusions:  azithromycin 500 mg (01/14/22 1206)   cefTRIAXone (ROCEPHIN)  IV 2 g (01/14/22 1128)     LOS: 1 day   Time spent: [place], MD [facility] To contact the attending provider between 7A-7P or the covering provider during after hours 7P-7A, please log into the web site [email or web address] and access using universal  password for that web site. If you do not have the password, please call the hospital operator.  01/14/2022, 1:35 PM

## 2022-01-15 DIAGNOSIS — J189 Pneumonia, unspecified organism: Secondary | ICD-10-CM | POA: Diagnosis not present

## 2022-01-15 LAB — GLUCOSE, CAPILLARY
Glucose-Capillary: 120 mg/dL — ABNORMAL HIGH (ref 70–99)
Glucose-Capillary: 107 mg/dL — ABNORMAL HIGH (ref 70–99)
Glucose-Capillary: 148 mg/dL — ABNORMAL HIGH (ref 70–99)

## 2022-01-15 MED ORDER — SACUBITRIL-VALSARTAN 49-51 MG PO TABS
1.0000 | ORAL_TABLET | Freq: Two times a day (BID) | ORAL | Status: DC
  Administered 2022-01-15 – 2022-01-16 (×3): 1 via ORAL
  Filled 2022-01-15 (×3): qty 1

## 2022-01-15 MED ORDER — SPIRONOLACTONE 25 MG PO TABS
25.0000 mg | ORAL_TABLET | Freq: Every day | ORAL | Status: DC
  Administered 2022-01-15 – 2022-01-16 (×2): 25 mg via ORAL
  Filled 2022-01-15 (×2): qty 1

## 2022-01-15 MED ORDER — FUROSEMIDE 40 MG PO TABS
40.0000 mg | ORAL_TABLET | Freq: Two times a day (BID) | ORAL | Status: DC
  Administered 2022-01-15 – 2022-01-16 (×2): 40 mg via ORAL
  Filled 2022-01-15 (×2): qty 1

## 2022-01-15 NOTE — Evaluation (Signed)
Physical Therapy Evaluation Patient Details Name: [name] MRN: [id number] DOB: [birth year] Today's Date: 01/15/2022  History of Present Illness  The pt is a 66 yo male presenting 5/29 from urgent care with SOB. Pt found to have CAP/SIRS vs ILD. PMH includes: ischemic cardiomyopathy, CAD, MI, PPM placement 2019, CHF, OSA, DM II, and HTN.   Clinical Impression  Pt in bed upon arrival of PT, agreeable to evaluation at this time. Prior to admission the pt was independent without use of DME, enjoys working in his yard but has had progressive issues with SOB and endurance with this activity. The pt now presents with limitations in functional mobility and endurance due to above dx, and will continue to benefit from skilled PT to address these deficits. He was able to demo good stability and independence with transfers and good stability with hallway ambulation without need for UE support. However, the pt continues to demo deficits in endurance and SOB requiring increase from 3L to 4L with ambulation and standing rest after every ~75 ft to recover from SpO2 88% to 90s. The pt was also able to complete 4 steps with supervision and 4L O2, again needing standing rest after exertion to recover SpO2 to 90% on 4L. The pt will continue to benefit from skilled PT acutely and after d/c to progress functional endurance and energy conservation strategies to maintain greatest level of independence.   Of note, wife asking for backpack style portable O2 to improve likelihood of pt continuing to use while in community and working in the yard.   Gait Speed: 0.72m/s with 4L O2. (Gait speed < 1.23m/s indicates increased risk of falls)  SpO2 on 3L at rest: 96% SpO2 on 3L with ambulation: 86% SppO2 on 4L with gait: 91%     Recommendations for follow up therapy are one component of a multi-disciplinary discharge planning process, led by the attending physician.  Recommendations may be updated based on patient status,  additional functional criteria and insurance authorization.  Follow Up Recommendations Outpatient PT (endurance and aerobic training)    Assistance Recommended at Discharge PRN  Patient can return home with the following       Equipment Recommendations None recommended by PT  Recommendations for Other Services       Functional Status Assessment Patient has had a recent decline in their functional status and demonstrates the ability to make significant improvements in function in a reasonable and predictable amount of time.     Precautions / Restrictions Precautions Precautions: Other (comment) Precaution Comments: watch SpO2, on 4L for gait Restrictions Weight Bearing Restrictions: No      Mobility  Bed Mobility Overal bed mobility: Independent                  Transfers Overall transfer level: Independent Equipment used: None                    Ambulation/Gait Ambulation/Gait assistance: Supervision Gait Distance (Feet): 250 Feet Assistive device: None Gait Pattern/deviations: Step-through pattern Gait velocity: 0.71 m/s Gait velocity interpretation: 1.31 - 2.62 ft/sec, indicative of limited community ambulator   General Gait Details: slowed with need for standing rest break x2. SpO2 to 88% but recovers with standing rest and PLB on 4L.  Stairs Stairs: Yes Stairs assistance: Supervision Stair Management: One rail Right, Forwards, Alternating pattern Number of Stairs: 4 General stair comments: spo2 to 88% on 4L, recovered to 90s quickly with standing rest    Balance Overall balance  assessment: Mild deficits observed, not formally tested                                           Pertinent Vitals/Pain Pain Assessment Pain Assessment: No/denies pain    Home Living Family/patient expects to be discharged to:: Private residence Living Arrangements: Spouse/significant other Available Help at Discharge: Family;Available 24  hours/day Type of Home: House Home Access: Stairs to enter Entrance Stairs-Rails: Right Entrance Stairs-Number of Steps: 4   Home Layout: One level Home Equipment: None Additional Comments: pt reports he has been given O2 in the past, weaned off of it, uses pulse ox to check    Prior Function Prior Level of Function : Independent/Modified Independent;Driving             Mobility Comments: pt independent without DME, works in yard but limited to 2-3 hours activity at time before needing rest ADLs Comments: pt reports independent     Hand Dominance        Extremity/Trunk Assessment   Upper Extremity Assessment Upper Extremity Assessment: Overall WFL for tasks assessed    Lower Extremity Assessment Lower Extremity Assessment: Overall WFL for tasks assessed    Cervical / Trunk Assessment Cervical / Trunk Assessment: Normal  Communication   Communication: No difficulties  Cognition Arousal/Alertness: Awake/alert Behavior During Therapy: WFL for tasks assessed/performed Overall Cognitive Status: Within Functional Limits for tasks assessed                                          General Comments General comments (skin integrity, edema, etc.): VSS on 3L at rest, 4L with gait        Assessment/Plan    PT Assessment Patient needs continued PT services  PT Problem List Cardiopulmonary status limiting activity;Decreased activity tolerance       PT Treatment Interventions DME instruction;Gait training;Stair training;Therapeutic exercise;Functional mobility training;Therapeutic activities;Balance training;Patient/family education    PT Goals (Current goals can be found in the Care Plan section)  Acute Rehab PT Goals Patient Stated Goal: return home, remain able to do yard work PT Goal Formulation: With patient Time For Goal Achievement: 01/29/22 Potential to Achieve Goals: Good    Frequency Min 3X/week        AM-PAC PT "6 Clicks" Mobility   Outcome Measure Help needed turning from your back to your side while in a flat bed without using bedrails?: None Help needed moving from lying on your back to sitting on the side of a flat bed without using bedrails?: None Help needed moving to and from a bed to a chair (including a wheelchair)?: None Help needed standing up from a chair using your arms (e.g., wheelchair or bedside chair)?: None Help needed to walk in hospital room?: A Little Help needed climbing 3-5 steps with a railing? : A Little 6 Click Score: 22    End of Session Equipment Utilized During Treatment: Gait belt;Oxygen Activity Tolerance: Patient tolerated treatment well Patient left: in chair;with call bell/phone within reach;with family/visitor present Nurse Communication: Mobility status PT Visit Diagnosis: Other abnormalities of gait and mobility (R26.89)    Time: 7591-6384 PT Time Calculation (min) (ACUTE ONLY): 28 min   Charges:   PT Evaluation $PT Eval Low Complexity: 1 Low PT Treatments $Therapeutic Exercise: 8-22 mins  Vickki Muff, PT, DPT   Acute Rehabilitation Department Pager #: [phone]  Ronnie Derby 01/15/2022, 3:21 PM

## 2022-01-15 NOTE — Progress Notes (Signed)
PROGRESS NOTE  [name]  [id number] DOB: Jun 23, 1956 DOA: 01/13/2022 PCP: [name], FNP   Brief Narrative:  Patient is a 66 year old male with history of ischemic cardiomyopathy, coronary artery disease status post stent placement, status post ICD placement, chronic systolic congestive heart failure with EF of 35 to 40%, OSA, ILD following with pulmonology, diabetes type 2 who presented with progressive shortness of breath, fever.  When he presented he was hypoxic requiring oxygen, hypotensive.  CT imaging of the chest showed left lower lobe pneumonia .started on antibiotics.  Assessment & Plan:  Principal Problem:   PNA (pneumonia) Active Problems:   HFrEF (heart failure with reduced ejection fraction) (HCC)   Metabolic acidosis   Community acquired pneumonia/SIRS: Presented with shortness of breath, fever CT chest showed left lower lobe pneumonia.  Continue current antibiotics.  Follow-up cultures, no growth to date.  Respiratory viral panel negative.  Acute hypoxic respiratory failure: Currently needing 3 to 4 L of oxygen per minute.  Secondary to pneumonia versus ILD.  Does not use oxygen at home.  He will most likely qualify for home oxygen.  Will check if he qualifies for home oxygen with ambulation.  History of ILD: CT imaging showed honeycombing, progressive fibrotic ILD.  Previously following pulmonology Dr. Vaughan Browner but now planning to follow with pulmonologist at as well. Also has history of COPD.  Continue home inhalers( trelegy) on discharge.  He will most likely qualify for home oxygen.  Chronic systolic congestive heart failure: Last echo on 01/2021 showed EF of 35 to 40%.  Echo done this admission showed the same.  Follows with cardiology.  Currently euvolemic.  Takes Entresto, Aldactone, Coreg, Lasix at home.  Resumed  Coronary artery disease: Takes Plavix and aspirin  Diabetes type 2: Takes insulin at home.  Monitors blood sugars.  Continue current insulin  regimen  Ventricular tachycardia: On mexiletine  Obesity: BMI of 30.2             DVT prophylaxis:heparin injection 5,000 Units Start: 01/13/22 1615     Code Status: DNR  Family Communication: Wife at bedside on 5/31  Patient status:inpatient  Patient is from :Home  Anticipated discharge RC:[id number]  Estimated DC date:In 1-2 days   Consultants: None  Procedures:None  Antimicrobials:  Anti-infectives (From admission, onward)    Start     Dose/Rate Route Frequency Ordered Stop   01/13/22 1615  cefTRIAXone (ROCEPHIN) 2 g in sodium chloride 0.9 % 100 mL IVPB  Status:  Discontinued        2 g 200 mL/hr over 30 Minutes Intravenous Every 24 hours 01/13/22 1602 01/13/22 1606   01/13/22 1615  azithromycin (ZITHROMAX) 500 mg in sodium chloride 0.9 % 250 mL IVPB  Status:  Discontinued        500 mg 250 mL/hr over 60 Minutes Intravenous Every 24 hours 01/13/22 1602 01/13/22 1606   01/13/22 1245  cefTRIAXone (ROCEPHIN) 2 g in sodium chloride 0.9 % 100 mL IVPB        2 g 200 mL/hr over 30 Minutes Intravenous Every 24 hours 01/13/22 1237 01/18/22 1244   01/13/22 1245  azithromycin (ZITHROMAX) 500 mg in sodium chloride 0.9 % 250 mL IVPB        500 mg 250 mL/hr over 60 Minutes Intravenous Every 24 hours 01/13/22 1237 01/18/22 1244       Subjective: Patient seen and examined at the bedside this morning.  Hemodynamically stable.  Lying in bed.  Appears weak. Not in respiratory  distress.  Needing 3 to 4 L of oxygen per minute.  Denies any worsening cough or shortness of breath.  Objective: Vitals:   01/14/22 2031 01/14/22 2100 01/15/22 0028 01/15/22 0354  BP:  120/68 126/67 125/67  Pulse:  69 69 74  Resp:  18 18 18   Temp:  98 F (36.7 C) 98.3 F (36.8 C) 97.8 F (36.6 C)  TempSrc:  Oral Oral Oral  SpO2: 94% 92% 92% 92%  Weight:    95.7 kg  Height:        Intake/Output Summary (Last 24 hours) at 01/15/2022 0740 Last data filed at 01/15/2022 [id number] Gross per 24 hour   Intake 1320 ml  Output 2700 ml  Net -1380 ml   Filed Weights   01/13/22 1830 01/14/22 0004 01/15/22 0354  Weight: 96.8 kg 96.8 kg 95.7 kg    Examination:  General exam: Overall comfortable, not in distress,obese HEENT: PERRL Respiratory system:  no wheezes or crackles  Cardiovascular system: S1 & S2 heard, RRR.  Gastrointestinal system: Abdomen is nondistended, soft and nontender. Central nervous system: Alert and oriented Extremities: No edema, no clubbing ,no cyanosis Skin: No rashes, no ulcers,no icterus     Data Reviewed: I have personally reviewed following labs and imaging studies  CBC: Recent Labs  Lab 01/13/22 1240 01/14/22 0335  WBC 12.6* 11.0*  NEUTROABS 9.7*  --   HGB 16.8 15.4  HCT 49.5 45.0  MCV 94.3 92.8  PLT 120* 123456*   Basic Metabolic Panel: Recent Labs  Lab 01/13/22 1240 01/14/22 0335  NA 133* 134*  K 3.8 3.9  CL 101 105  CO2 20* 19*  GLUCOSE 148* 89  BUN 14 12  CREATININE 1.19 1.22  CALCIUM 8.6* 8.1*  MG  --  1.9     Recent Results (from the past 240 hour(s))  Urine Culture     Status: None   Collection Time: 01/13/22 12:26 PM   Specimen: In/Out Cath Urine  Result Value Ref Range Status   Specimen Description IN/OUT CATH URINE  Final   Special Requests NONE  Final   Culture   Final    NO GROWTH Performed at [facility], [address] [place],  [address]    Report Status 01/14/2022 FINAL  Final  SARS Coronavirus 2 by RT PCR (hospital order, performed in [facility] hospital lab) *cepheid single result test* Anterior Nasal Swab     Status: None   Collection Time: 01/13/22 12:36 PM   Specimen: Anterior Nasal Swab  Result Value Ref Range Status   SARS Coronavirus 2 by RT PCR NEGATIVE NEGATIVE Final    Comment: (NOTE) SARS-CoV-2 target nucleic acids are NOT DETECTED.  The SARS-CoV-2 RNA is generally detectable in upper and lower respiratory specimens during the acute phase of infection. The lowest concentration  of SARS-CoV-2 viral copies this assay can detect is 250 copies / mL. A negative result does not preclude SARS-CoV-2 infection and should not be used as the sole basis for treatment or other patient management decisions.  A negative result may occur with improper specimen collection / handling, submission of specimen other than nasopharyngeal swab, presence of viral mutation(s) within the areas targeted by this assay, and inadequate number of viral copies (<250 copies / mL). A negative result must be combined with clinical observations, patient history, and epidemiological information.  Fact Sheet for Patients:   https://www.patel.info/  Fact Sheet for Healthcare Providers: https://hall.com/  This test is not yet approved or  cleared by the [place] and has been authorized for detection and/or diagnosis of SARS-CoV-2 by FDA under an Emergency Use Authorization (EUA).  This EUA will remain in effect (meaning this test can be used) for the duration of the COVID-19 declaration under Section 564(b)(1) of the Act, 21 U.S.C. section 360bbb-3(b)(1), unless the authorization is terminated or revoked sooner.  Performed at [facility], [place] [address], [place], [place] [address]   Blood Culture (routine x 2)     Status: None (Preliminary result)   Collection Time: 01/13/22 12:39 PM   Specimen: BLOOD  Result Value Ref Range Status   Specimen Description BLOOD RIGHT HAND  Final   Special Requests   Final    BOTTLES DRAWN AEROBIC AND ANAEROBIC Blood Culture results may not be optimal due to an inadequate volume of blood received in culture bottles   Culture   Final    NO GROWTH < 24 HOURS Performed at [facility], [place] [address] [place], [place] [address]    Report Status PENDING  Incomplete  Blood Culture (routine x 2)     Status: None (Preliminary result)   Collection Time: 01/13/22 12:40 PM   Specimen: BLOOD  Result Value  Ref Range Status   Specimen Description BLOOD LEFT HAND  Final   Special Requests   Final    BOTTLES DRAWN AEROBIC AND ANAEROBIC Blood Culture results may not be optimal due to an inadequate volume of blood received in culture bottles   Culture   Final    NO GROWTH < 24 HOURS Performed at [facility], [place] [address] [place], [place] [address]    Report Status PENDING  Incomplete  Respiratory (~20 pathogens) panel by PCR     Status: None   Collection Time: 01/13/22  2:00 PM   Specimen: Nasopharyngeal Swab; Respiratory  Result Value Ref Range Status   Adenovirus NOT DETECTED NOT DETECTED Final   Coronavirus 229E NOT DETECTED NOT DETECTED Final    Comment: (NOTE) The Coronavirus on the Respiratory Panel, DOES NOT test for the novel  Coronavirus (2019 nCoV)    Coronavirus HKU1 NOT DETECTED NOT DETECTED Final   Coronavirus NL63 NOT DETECTED NOT DETECTED Final   Coronavirus OC43 NOT DETECTED NOT DETECTED Final   Metapneumovirus NOT DETECTED NOT DETECTED Final   Rhinovirus / Enterovirus NOT DETECTED NOT DETECTED Final   Influenza A NOT DETECTED NOT DETECTED Final   Influenza B NOT DETECTED NOT DETECTED Final   Parainfluenza Virus 1 NOT DETECTED NOT DETECTED Final   Parainfluenza Virus 2 NOT DETECTED NOT DETECTED Final   Parainfluenza Virus 3 NOT DETECTED NOT DETECTED Final   Parainfluenza Virus 4 NOT DETECTED NOT DETECTED Final   Respiratory Syncytial Virus NOT DETECTED NOT DETECTED Final   Bordetella pertussis NOT DETECTED NOT DETECTED Final   Bordetella Parapertussis NOT DETECTED NOT DETECTED Final   Chlamydophila pneumoniae NOT DETECTED NOT DETECTED Final   Mycoplasma pneumoniae NOT DETECTED NOT DETECTED Final    Comment: Performed at [facility], [place], [place], [place] [address]     Radiology Studies: CT Angio Chest PE W and/or Wo Contrast  Result Date: 01/13/2022 CLINICAL DATA:  Pulmonary embolism (PE) suspected, high prob. History of interstitial lung  disease EXAM: CT ANGIOGRAPHY CHEST WITH CONTRAST TECHNIQUE: Multidetector CT imaging of the chest was performed using the standard protocol during bolus administration of intravenous contrast. Multiplanar CT image reconstructions and MIPs were obtained to evaluate  the vascular anatomy. RADIATION DOSE REDUCTION: This exam was performed according to the departmental dose-optimization program which includes automated exposure control, adjustment of the mA and/or kV according to patient size and/or use of iterative reconstruction technique. CONTRAST:  73mL OMNIPAQUE IOHEXOL 350 MG/ML SOLN COMPARISON:  CT 12/13/2019 FINDINGS: Cardiovascular: Satisfactory opacification of the pulmonary arteries to the segmental level. No evidence of pulmonary embolism. Main pulmonary trunk is mildly dilated. Thoracic aorta is nonaneurysmal. Atherosclerotic calcifications of the aorta and coronary arteries. Cardiomegaly. No pericardial effusion. Mediastinum/Nodes: Enlarged bilateral hilar lymph nodes. Reference nodes include lower right hilar node measuring 1.3 cm (series 7, image 71) and lower right hilar node measuring 1.3 cm (series 7, image 67). Mildly enlarged 1.2 cm upper right paratracheal mediastinal lymph node (series 7, image 22). No axillary lymphadenopathy. Decreased AP diameter of the trachea suggesting tracheomalacia. Esophagus appears within normal limits. Lungs/Pleura: Progression of fibrotic interstitial lung disease with honeycombing. Superimposed focal airspace consolidation within the left lower lobe suspicious for pneumonia. No pleural effusion or pneumothorax. Upper Abdomen: Hepatomegaly. Diffusely decreased attenuation of the hepatic parenchyma. Single gallstone is evident within the gallbladder. No acute findings within the included upper abdomen. Musculoskeletal: No chest wall abnormality. No acute or significant osseous findings. Review of the MIP images confirms the above findings. IMPRESSION: 1. No evidence of  pulmonary embolism. 2. Progression of fibrotic interstitial lung disease with honeycombing. 3. Superimposed focal airspace consolidation within the left lower lobe suspicious for pneumonia. Follow-up to resolution is recommended to exclude the possibility of an underlying neoplasm. 4. Enlarged bilateral hilar and mediastinal lymph nodes, likely reactive. 5. Hepatomegaly and hepatic steatosis. 6. Cholelithiasis. Aortic Atherosclerosis (ICD10-I70.0). Electronically Signed   By: Davina Poke D.O.   On: 01/13/2022 15:09   DG Chest Port 1 View  Result Date: 01/13/2022 CLINICAL DATA:  Gradually worsening shortness of breath for the past 5 days. EXAM: PORTABLE CHEST 1 VIEW COMPARISON:  12/23/2019 FINDINGS: Stable enlarged cardiac silhouette. Clear lungs. Diffusely prominent interstitial markings with improvement. Possible small amount of right basilar pleural thickening fluid laterally with improved aeration in that area. IMPRESSION: No acute abnormality. Stable cardiomegaly and mild chronic interstitial lung disease. Electronically Signed   By: Claudie Revering M.D.   On: 01/13/2022 13:45   ECHOCARDIOGRAM COMPLETE  Result Date: 01/14/2022    ECHOCARDIOGRAM REPORT   Patient Name:   [name] Date of Exam: 01/14/2022 Medical Rec #:  [id number]       Height:       70.0 in Accession #:    [id number]      Weight:       213.4 lb Date of Birth:  Sep 25, 1955       BSA:          2.146 m Patient Age:    68 years        BP:           116/60 mmHg Patient Gender: M               HR:           77 bpm. Exam Location:  Inpatient Procedure: 2D Echo, Cardiac Doppler and Color Doppler Indications:    Congestive heart failure  History:        Patient has prior history of Echocardiogram examinations, most                 recent 01/21/2021. CHF, CAD, Defibrillator; Risk  Factors:Hypertension, Diabetes and Former Smoker.  Sonographer:    Joette Catching RCS Referring Phys: [place]  1. Poor  acoustic windows limit study.  2. Left ventricular ejection fraction, by estimation, is 35 to 40%. The left ventricle has moderately decreased function. The left ventricle demonstrates global hypokinesis. Left ventricular diastolic parameters were normal.  3. Right ventricular systolic function is mildly reduced. The right ventricular size is normal.  4. The mitral valve is normal in structure. Mild mitral valve regurgitation.  5. The aortic valve is tricuspid. Aortic valve regurgitation is not visualized.  6. IVC is dilated,. Comparison(s): The left ventricular function is unchanged. FINDINGS  Left Ventricle: Left ventricular ejection fraction, by estimation, is 35 to 40%. The left ventricle has moderately decreased function. The left ventricle demonstrates global hypokinesis. The left ventricular internal cavity size was normal in size. There is no left ventricular hypertrophy. Left ventricular diastolic parameters were normal. Right Ventricle: The right ventricular size is normal. Right vetricular wall thickness was not assessed. Right ventricular systolic function is mildly reduced. Left Atrium: Left atrial size was normal in size. Right Atrium: Right atrial size was normal in size. Pericardium: There is no evidence of pericardial effusion. Mitral Valve: The mitral valve is normal in structure. Mild mitral valve regurgitation. Tricuspid Valve: The tricuspid valve is normal in structure. Tricuspid valve regurgitation is trivial. Aortic Valve: The aortic valve is tricuspid. Aortic valve regurgitation is not visualized. Aortic valve mean gradient measures 7.0 mmHg. Aortic valve peak gradient measures 11.2 mmHg. Aortic valve area, by VTI measures 2.27 cm. Pulmonic Valve: The pulmonic valve was normal in structure. Pulmonic valve regurgitation is not visualized. Aorta: The aortic root and ascending aorta are structurally normal, with no evidence of dilitation. Venous: IVC is dilated,. IAS/Shunts: No atrial level  shunt detected by color flow Doppler. Additional Comments: A device lead is visualized.  LEFT VENTRICLE PLAX 2D LVIDd:         5.85 cm     Diastology LVIDs:         4.80 cm     LV e' medial:    7.72 cm/s LV PW:         0.80 cm     LV E/e' medial:  10.7 LV IVS:        0.85 cm     LV e' lateral:   10.30 cm/s LVOT diam:     2.30 cm     LV E/e' lateral: 8.0 LV SV:         75 LV SV Index:   35 LVOT Area:     4.15 cm  LV Volumes (MOD) LV vol d, MOD A2C: 77.8 ml LV vol d, MOD A4C: 90.9 ml LV vol s, MOD A2C: 56.7 ml LV vol s, MOD A4C: 59.8 ml LV SV MOD A2C:     21.1 ml LV SV MOD A4C:     90.9 ml LV SV MOD BP:      30.7 ml RIGHT VENTRICLE             IVC RV Basal diam:  4.30 cm     IVC diam: 2.70 cm RV Mid diam:    3.60 cm RV S prime:     10.10 cm/s TAPSE (M-mode): 1.6 cm LEFT ATRIUM           Index        RIGHT ATRIUM           Index LA diam:  4.20 cm 1.96 cm/m   RA Area:     20.00 cm LA Vol (A2C): 28.4 ml 13.24 ml/m  RA Volume:   60.80 ml  28.34 ml/m LA Vol (A4C): 57.9 ml 26.98 ml/m  AORTIC VALVE                     PULMONIC VALVE AV Area (Vmax):    2.42 cm      PV Vmax:       0.93 m/s AV Area (Vmean):   2.14 cm      PV Peak grad:  3.5 mmHg AV Area (VTI):     2.27 cm AV Vmax:           167.00 cm/s AV Vmean:          131.000 cm/s AV VTI:            0.329 m AV Peak Grad:      11.2 mmHg AV Mean Grad:      7.0 mmHg LVOT Vmax:         97.30 cm/s LVOT Vmean:        67.500 cm/s LVOT VTI:          0.180 m LVOT/AV VTI ratio: 0.55  AORTA Ao Root diam: 3.00 cm Ao Asc diam:  3.20 cm MITRAL VALVE               TRICUSPID VALVE MV Area (PHT): 4.39 cm    TR Peak grad:   21.5 mmHg MV Decel Time: 173 msec    TR Vmax:        232.00 cm/s MV E velocity: 82.70 cm/s MV A velocity: 79.30 cm/s  SHUNTS MV E/A ratio:  1.04        Systemic VTI:  0.18 m                            Systemic Diam: 2.30 cm Dorris Carnes MD Electronically signed by Dorris Carnes MD Signature Date/Time: 01/14/2022/1:41:32 PM    Final     Scheduled Meds:  aspirin  EC  81 mg Oral Daily   carvedilol  12.5 mg Oral BID WC   clopidogrel  75 mg Oral Daily   empagliflozin  25 mg Oral Daily   guaiFENesin  600 mg Oral BID   heparin  5,000 Units Subcutaneous Q8H   insulin aspart  5 Units Subcutaneous TID WC   insulin glargine-yfgn  40 Units Subcutaneous QHS   mexiletine  150 mg Oral Q12H   pantoprazole  40 mg Oral QPM   pramipexole  1 mg Oral TID   umeclidinium bromide  1 puff Inhalation Daily   Continuous Infusions:  azithromycin 500 mg (01/14/22 1206)   cefTRIAXone (ROCEPHIN)  IV 2 g (01/14/22 1128)     LOS: 2 days   Shelly Coss, MD [facility] P5/31/2023, 7:40 AM

## 2022-01-15 NOTE — Progress Notes (Signed)
SATURATION QUALIFICATIONS: (This note is used to comply with regulatory documentation for home oxygen)  Patient Saturations on Room Air at Rest = 90%  Patient Saturations on Room Air while Ambulating = 86%  Patient Saturations on 3 Liters of oxygen while Ambulating = 92%  Please briefly explain why patient needs home oxygen: N/A

## 2022-01-15 NOTE — Progress Notes (Signed)
Mobility Specialist Progress Note   01/15/22 1300  Mobility  Activity Ambulated with assistance to bathroom;Ambulated with assistance in hallway  Level of Assistance Modified independent, requires aide device or extra time  Assistive Device None  Distance Ambulated (ft) 470 ft  Activity Response Tolerated well  $Mobility charge 1 Mobility   Pre Mobility: 71 HR, 90% SpO2 on RA During Mobility: 89 HR, BP, SpO2 on 3-4LO2 Post Mobility:  74 HR, 92% SpO2 on 3LO2  Received in bed on 2LO2 at 91% SpO2 having no complaints and agreeable. When performing ambulatory O2 sat test pt dropped to 86% SpO2 on RA, then applied 2LO2, per advisement of RN and pt on rose to 88%. Increased to 4LO2 and pt re-saturated to 93% SpO2. RN then requesting to keep pt on 3LO2 on remainder of session. No faults or complaints through out, left in bed w/ call bell in reach and needs met.   Jeremiah Hedrick Mobility Specialist Phone Number [phone]  

## 2022-01-15 NOTE — Plan of Care (Signed)
  Problem: Education: Goal: Ability to demonstrate management of disease process will improve Outcome: Progressing   Problem: Education: Goal: Ability to verbalize understanding of medication therapies will improve Outcome: Progressing   Problem: Clinical Measurements: Goal: Ability to maintain a body temperature in the normal range will improve Outcome: Progressing

## 2022-01-16 DIAGNOSIS — J189 Pneumonia, unspecified organism: Secondary | ICD-10-CM | POA: Diagnosis not present

## 2022-01-16 LAB — GLUCOSE, CAPILLARY
Glucose-Capillary: 86 mg/dL (ref 70–99)
Glucose-Capillary: 93 mg/dL (ref 70–99)
Glucose-Capillary: 151 mg/dL — ABNORMAL HIGH (ref 70–99)

## 2022-01-16 LAB — BASIC METABOLIC PANEL
Anion gap: 6 (ref 5–15)
BUN: 10 mg/dL (ref 8–23)
CO2: 20 mmol/L — ABNORMAL LOW (ref 22–32)
Calcium: 8.4 mg/dL — ABNORMAL LOW (ref 8.9–10.3)
Chloride: 109 mmol/L (ref 98–111)
Creatinine, Ser: 0.97 mg/dL (ref 0.61–1.24)
GFR, Estimated: >60 mL/min (ref >60)
Glucose, Bld: 99 mg/dL (ref 70–99)
Potassium: 3.8 mmol/L (ref 3.5–5.1)
Sodium: 135 mmol/L (ref 135–145)

## 2022-01-16 MED ORDER — GUAIFENESIN ER 600 MG PO TB12: 600.0000 mg | ORAL_TABLET | Freq: Two times a day (BID) | ORAL | 0 refills | Status: AC

## 2022-01-16 MED ORDER — CEFDINIR 300 MG PO CAPS: 300.0000 mg | ORAL_CAPSULE | Freq: Two times a day (BID) | ORAL | 0 refills | Status: AC

## 2022-01-16 MED ORDER — AZITHROMYCIN 500 MG PO TABS: 500.0000 mg | ORAL_TABLET | Freq: Every day | ORAL | 0 refills | Status: AC

## 2022-01-16 NOTE — Care Management Important Message (Signed)
Important Message  Patient Details  Name: [name] MRN: [id number] Date of Birth: [birth year]   Medicare Important Message Given:  Yes     Renie Ora 01/16/2022, 8:59 AM

## 2022-01-16 NOTE — Discharge Summary (Signed)
Physician Discharge Summary  [name] [id number] DOB: 05-May-1956 DOA: 01/13/2022  PCP: [name], FNP  Admit date: 01/13/2022 Discharge date: 01/16/2022  Admitted From: Home Disposition:  Home  Discharge Condition:Stable CODE STATUS:FULL Diet recommendation: Heart Healthy   Brief/Interim Summary: Patient is a 66 year old male with history of ischemic cardiomyopathy, coronary artery disease status post stent placement, status post ICD placement, chronic systolic congestive heart failure with EF of 35 to 40%, OSA, ILD following with pulmonology, diabetes type 2 who presented with progressive shortness of breath, fever.  When he presented he was hypoxic requiring oxygen, hypotensive.  CT imaging of the chest showed left lower lobe pneumonia .started on antibiotics with symptomatic improvement.  Patient has been previously prescribed oxygen at home but he is not compliant.  He qualified for home oxygen again.  Patient is adamant about being discharged today.  Medically stable for discharge.  Following problems were addressed during his hospitalization:  Community acquired pneumonia/SIRS: Presented with shortness of breath, fever CT chest showed left lower lobe pneumonia.  Continue current antibiotics.  Follow-up cultures, no growth to date.  Respiratory viral panel negative.   Acute hypoxic respiratory failure: Currently needing 3 to 4 L of oxygen per minute.  Secondary to pneumonia versus ILD.  Does not use oxygen at home.  He was previously qualified for home oxygen but does not use.  Qualified for 4 L of oxygen per minute.  History of ILD: CT imaging showed honeycombing, progressive fibrotic ILD.  Previously following pulmonology Dr. Vaughan Browner but now planning to follow with pulmonologist at [facility] Also has history of COPD.  Continue home inhalers( trelegy) on discharge.  Chronic systolic congestive heart failure: Last echo on 01/2021 showed EF of 35 to 40%.  Echo done this admission  showed the same.  Follows with cardiology.  Currently euvolemic.  Takes Entresto, Aldactone, Coreg, Lasix at home.  Resumed   Coronary artery disease: Takes Plavix and aspirin   Diabetes type 2: Takes insulin at home.  Monitors blood sugars.  Continue current insulin regimen   Ventricular tachycardia: On mexiletine   Obesity: BMI of 30.2     Discharge Diagnoses:  Principal Problem:   PNA (pneumonia) Active Problems:   HFrEF (heart failure with reduced ejection fraction) (HCC)   Metabolic acidosis    Discharge Instructions  Discharge Instructions     Diet - low sodium heart healthy   Complete by: As directed    Discharge instructions   Complete by: As directed    1)Please take prescribed medications as instructed 2)Follow up with your PCP in a week 3)Follow up with your pulmonologist in 1 to 2 weeks.  Follow-up with your cardiologist   Increase activity slowly   Complete by: As directed       Allergies as of 01/16/2022       Reactions   Brilinta [ticagrelor] Shortness Of Breath, Other (See Comments)   Intolerable dyspnea-changed to Plavix   Metformin And Related Shortness Of Breath   Amiodarone Other (See Comments)   SCARS THE LUNGS   Codeine Other (See Comments)   Lightheaded, vision issues   Lipitor [atorvastatin Calcium] Other (See Comments)   Muscle pain   Niaspan [niacin Er] Other (See Comments)   Made the head hurt        Medication List     TAKE these medications    acetaminophen 500 MG tablet Commonly known as: TYLENOL Take 500 mg by mouth every 6 (six) hours as needed (for headaches.).  albuterol 108 (90 Base) MCG/ACT inhaler Commonly known as: VENTOLIN HFA Inhale 1 puff into the lungs every 6 (six) hours as needed for wheezing or shortness of breath.   aspirin 81 MG chewable tablet Chew 1 tablet (81 mg total) by mouth daily.   azithromycin 500 MG tablet Commonly known as: Zithromax Take 1 tablet (500 mg total) by mouth daily for 2 days.  Take 1 tablet daily for 2 days. Start taking on: January 17, 2022   BD Pen Needle Nano 2nd Gen 32G X 4 MM Misc Generic drug: Insulin Pen Needle See admin instructions.   carvedilol 12.5 MG tablet Commonly known as: COREG Take 1.5 tablets (18.75 mg total) by mouth 2 (two) times daily with a meal.   cefdinir 300 MG capsule Commonly known as: OMNICEF Take 1 capsule (300 mg total) by mouth 2 (two) times daily for 4 days. Start taking on: January 17, 2022   CINNAMON PO Take 1,000 mg by mouth in the morning and at bedtime.   clopidogrel 75 MG tablet Commonly known as: PLAVIX Take 1 tablet (75 mg total) by mouth daily.   Co Q 10 100 MG Caps Take 100 mg by mouth in the morning.   empagliflozin 25 MG Tabs tablet Commonly known as: JARDIANCE Take 25 mg by mouth in the morning.   Entresto 49-51 MG Generic drug: sacubitril-valsartan TAKE 1 TABLET BY MOUTH TWICE DAILY What changed: when to take this   furosemide 40 MG tablet Commonly known as: LASIX Take 1 tablet (40 mg total) by mouth 2 (two) times daily.   guaiFENesin 600 MG 12 hr tablet Commonly known as: MUCINEX Take 1 tablet (600 mg total) by mouth 2 (two) times daily for 5 days.   magnesium oxide 400 MG tablet Commonly known as: MAG-OX Take 400 mg by mouth daily in the afternoon.   mexiletine 150 MG capsule Commonly known as: MEXITIL TAKE 1 CAPSULE(150 MG) BY MOUTH EVERY 12 HOURS What changed:  how much to take how to take this when to take this additional instructions   nitroGLYCERIN 0.4 MG SL tablet Commonly known as: NITROSTAT Place 1 tablet (0.4 mg total) under the tongue every 5 (five) minutes as needed for chest pain.   NovoLOG FlexPen 100 UNIT/ML FlexPen Generic drug: insulin aspart Inject 5 Units into the skin See admin instructions. Inject 5 units into the skin in the morning before breakfast and two more times a day before meals as needed for an elevated BGL   pantoprazole 40 MG tablet Commonly known as:  PROTONIX Take 40 mg by mouth every evening.   Praluent 75 MG/ML Soaj Generic drug: Alirocumab ADMINISTER 1 ML UNDER THE SKIN EVERY 14 DAYS What changed: See the new instructions.   pramipexole 1 MG tablet Commonly known as: MIRAPEX Take 0.5 mg by mouth See admin instructions. Take 0.5 mg by mouth at 7:30 AM, 11:00 AM, 4:00 PM, and 7:30 PM   spironolactone 25 MG tablet Commonly known as: ALDACTONE Take 1 tablet (25 mg total) by mouth daily.   TOUJEO SOLOSTAR Pentress Inject 50 Units into the skin at bedtime.   Trelegy Ellipta 100-62.5-25 MCG/ACT Aepb Generic drug: Fluticasone-Umeclidin-Vilant Take 1 puff by mouth daily.               Durable Medical Equipment  (From admission, onward)           Start     Ordered   01/16/22 1043  For home use only DME oxygen  Once  Question Answer Comment  Length of Need Lifetime   Mode or (Route) Nasal cannula   Liters per Minute 4   Frequency Continuous (stationary and portable oxygen unit needed)   Oxygen delivery system Gas      01/16/22 1044            Follow-up Information     [name], FNP. Schedule an appointment as soon as possible for a visit in 1 week(s).   Specialty: Family Medicine Contact information: [place] [address] [phone]                Allergies  Allergen Reactions   Brilinta [Ticagrelor] Shortness Of Breath and Other (See Comments)    Intolerable dyspnea-changed to Plavix   Metformin And Related Shortness Of Breath   Amiodarone Other (See Comments)    SCARS THE LUNGS   Codeine Other (See Comments)    Lightheaded, vision issues   Lipitor [Atorvastatin Calcium] Other (See Comments)    Muscle pain   Niaspan [Niacin Er] Other (See Comments)    Made the head hurt    Consultations: None   Procedures/Studies: CT Angio Chest PE W and/or Wo Contrast  Result Date: 01/13/2022 CLINICAL DATA:  Pulmonary embolism (PE) suspected, high prob. History of  interstitial lung disease EXAM: CT ANGIOGRAPHY CHEST WITH CONTRAST TECHNIQUE: Multidetector CT imaging of the chest was performed using the standard protocol during bolus administration of intravenous contrast. Multiplanar CT image reconstructions and MIPs were obtained to evaluate the vascular anatomy. RADIATION DOSE REDUCTION: This exam was performed according to the departmental dose-optimization program which includes automated exposure control, adjustment of the mA and/or kV according to patient size and/or use of iterative reconstruction technique. CONTRAST:  30mL OMNIPAQUE IOHEXOL 350 MG/ML SOLN COMPARISON:  CT 12/13/2019 FINDINGS: Cardiovascular: Satisfactory opacification of the pulmonary arteries to the segmental level. No evidence of pulmonary embolism. Main pulmonary trunk is mildly dilated. Thoracic aorta is nonaneurysmal. Atherosclerotic calcifications of the aorta and coronary arteries. Cardiomegaly. No pericardial effusion. Mediastinum/Nodes: Enlarged bilateral hilar lymph nodes. Reference nodes include lower right hilar node measuring 1.3 cm (series 7, image 71) and lower right hilar node measuring 1.3 cm (series 7, image 67). Mildly enlarged 1.2 cm upper right paratracheal mediastinal lymph node (series 7, image 22). No axillary lymphadenopathy. Decreased AP diameter of the trachea suggesting tracheomalacia. Esophagus appears within normal limits. Lungs/Pleura: Progression of fibrotic interstitial lung disease with honeycombing. Superimposed focal airspace consolidation within the left lower lobe suspicious for pneumonia. No pleural effusion or pneumothorax. Upper Abdomen: Hepatomegaly. Diffusely decreased attenuation of the hepatic parenchyma. Single gallstone is evident within the gallbladder. No acute findings within the included upper abdomen. Musculoskeletal: No chest wall abnormality. No acute or significant osseous findings. Review of the MIP images confirms the above findings. IMPRESSION:  1. No evidence of pulmonary embolism. 2. Progression of fibrotic interstitial lung disease with honeycombing. 3. Superimposed focal airspace consolidation within the left lower lobe suspicious for pneumonia. Follow-up to resolution is recommended to exclude the possibility of an underlying neoplasm. 4. Enlarged bilateral hilar and mediastinal lymph nodes, likely reactive. 5. Hepatomegaly and hepatic steatosis. 6. Cholelithiasis. Aortic Atherosclerosis (ICD10-I70.0). Electronically Signed   By: Davina Poke D.O.   On: 01/13/2022 15:09   DG Chest Port 1 View  Result Date: 01/13/2022 CLINICAL DATA:  Gradually worsening shortness of breath for the past 5 days. EXAM: PORTABLE CHEST 1 VIEW COMPARISON:  12/23/2019 FINDINGS: Stable enlarged cardiac silhouette. Clear lungs. Diffusely prominent  interstitial markings with improvement. Possible small amount of right basilar pleural thickening fluid laterally with improved aeration in that area. IMPRESSION: No acute abnormality. Stable cardiomegaly and mild chronic interstitial lung disease. Electronically Signed   By: Claudie Revering M.D.   On: 01/13/2022 13:45   ECHOCARDIOGRAM COMPLETE  Result Date: 01/14/2022    ECHOCARDIOGRAM REPORT   Patient Name:   [name] Date of Exam: 01/14/2022 Medical Rec #:  [id number]       Height:       70.0 in Accession #:    [id number]      Weight:       213.4 lb Date of Birth:  01/26/1956       BSA:          2.146 m Patient Age:    75 years        BP:           116/60 mmHg Patient Gender: M               HR:           77 bpm. Exam Location:  Inpatient Procedure: 2D Echo, Cardiac Doppler and Color Doppler Indications:    Congestive heart failure  History:        Patient has prior history of Echocardiogram examinations, most                 recent 01/21/2021. CHF, CAD, Defibrillator; Risk                 Factors:Hypertension, Diabetes and Former Smoker.  Sonographer:    Joette Catching RCS Referring Phys: [place]  1. Poor acoustic windows limit study.  2. Left ventricular ejection fraction, by estimation, is 35 to 40%. The left ventricle has moderately decreased function. The left ventricle demonstrates global hypokinesis. Left ventricular diastolic parameters were normal.  3. Right ventricular systolic function is mildly reduced. The right ventricular size is normal.  4. The mitral valve is normal in structure. Mild mitral valve regurgitation.  5. The aortic valve is tricuspid. Aortic valve regurgitation is not visualized.  6. IVC is dilated,. Comparison(s): The left ventricular function is unchanged. FINDINGS  Left Ventricle: Left ventricular ejection fraction, by estimation, is 35 to 40%. The left ventricle has moderately decreased function. The left ventricle demonstrates global hypokinesis. The left ventricular internal cavity size was normal in size. There is no left ventricular hypertrophy. Left ventricular diastolic parameters were normal. Right Ventricle: The right ventricular size is normal. Right vetricular wall thickness was not assessed. Right ventricular systolic function is mildly reduced. Left Atrium: Left atrial size was normal in size. Right Atrium: Right atrial size was normal in size. Pericardium: There is no evidence of pericardial effusion. Mitral Valve: The mitral valve is normal in structure. Mild mitral valve regurgitation. Tricuspid Valve: The tricuspid valve is normal in structure. Tricuspid valve regurgitation is trivial. Aortic Valve: The aortic valve is tricuspid. Aortic valve regurgitation is not visualized. Aortic valve mean gradient measures 7.0 mmHg. Aortic valve peak gradient measures 11.2 mmHg. Aortic valve area, by VTI measures 2.27 cm. Pulmonic Valve: The pulmonic valve was normal in structure. Pulmonic valve regurgitation is not visualized. Aorta: The aortic root and ascending aorta are structurally normal, with no evidence of dilitation. Venous: IVC is dilated,.  IAS/Shunts: No atrial level shunt detected by color flow Doppler. Additional Comments: A device lead is visualized.  LEFT VENTRICLE PLAX 2D LVIDd:  5.85 cm     Diastology LVIDs:         4.80 cm     LV e' medial:    7.72 cm/s LV PW:         0.80 cm     LV E/e' medial:  10.7 LV IVS:        0.85 cm     LV e' lateral:   10.30 cm/s LVOT diam:     2.30 cm     LV E/e' lateral: 8.0 LV SV:         75 LV SV Index:   35 LVOT Area:     4.15 cm  LV Volumes (MOD) LV vol d, MOD A2C: 77.8 ml LV vol d, MOD A4C: 90.9 ml LV vol s, MOD A2C: 56.7 ml LV vol s, MOD A4C: 59.8 ml LV SV MOD A2C:     21.1 ml LV SV MOD A4C:     90.9 ml LV SV MOD BP:      30.7 ml RIGHT VENTRICLE             IVC RV Basal diam:  4.30 cm     IVC diam: 2.70 cm RV Mid diam:    3.60 cm RV S prime:     10.10 cm/s TAPSE (M-mode): 1.6 cm LEFT ATRIUM           Index        RIGHT ATRIUM           Index LA diam:      4.20 cm 1.96 cm/m   RA Area:     20.00 cm LA Vol (A2C): 28.4 ml 13.24 ml/m  RA Volume:   60.80 ml  28.34 ml/m LA Vol (A4C): 57.9 ml 26.98 ml/m  AORTIC VALVE                     PULMONIC VALVE AV Area (Vmax):    2.42 cm      PV Vmax:       0.93 m/s AV Area (Vmean):   2.14 cm      PV Peak grad:  3.5 mmHg AV Area (VTI):     2.27 cm AV Vmax:           167.00 cm/s AV Vmean:          131.000 cm/s AV VTI:            0.329 m AV Peak Grad:      11.2 mmHg AV Mean Grad:      7.0 mmHg LVOT Vmax:         97.30 cm/s LVOT Vmean:        67.500 cm/s LVOT VTI:          0.180 m LVOT/AV VTI ratio: 0.55  AORTA Ao Root diam: 3.00 cm Ao Asc diam:  3.20 cm MITRAL VALVE               TRICUSPID VALVE MV Area (PHT): 4.39 cm    TR Peak grad:   21.5 mmHg MV Decel Time: 173 msec    TR Vmax:        232.00 cm/s MV E velocity: 82.70 cm/s MV A velocity: 79.30 cm/s  SHUNTS MV E/A ratio:  1.04        Systemic VTI:  0.18 m  Systemic Diam: 2.30 cm Dorris Carnes MD Electronically signed by Dorris Carnes MD Signature Date/Time: 01/14/2022/1:41:32 PM    Final        Subjective: Patient seen and examined at the bedside this morning.  Hemodynamically stable for discharge today.  Discharge planning discussed with the wife at bedside.  Discharge Exam: Vitals:   01/16/22 0745 01/16/22 1036  BP:  114/71  Pulse:    Resp:    Temp:    SpO2: 92% 97%   Vitals:   01/16/22 0608 01/16/22 0717 01/16/22 0745 01/16/22 1036  BP: 124/70 120/85  114/71  Pulse: 66 63    Resp:      Temp:  98.3 F (36.8 C)    TempSrc:  Oral    SpO2:  91% 92% 97%  Weight:      Height:        General: Pt is alert, awake, not in acute distress Cardiovascular: RRR, S1/S2 +, no rubs, no gallops Respiratory: CTA bilaterally, no wheezing, no rhonchi Abdominal: Soft, NT, ND, bowel sounds + Extremities: no edema, no cyanosis    The results of significant diagnostics from this hospitalization (including imaging, microbiology, ancillary and laboratory) are listed below for reference.     Microbiology: Recent Results (from the past 240 hour(s))  Urine Culture     Status: None   Collection Time: 01/13/22 12:26 PM   Specimen: In/Out Cath Urine  Result Value Ref Range Status   Specimen Description IN/OUT CATH URINE  Final   Special Requests NONE  Final   Culture   Final    NO GROWTH Performed at [facility], [address] [place], [place] [address]    Report Status 01/14/2022 FINAL  Final  SARS Coronavirus 2 by RT PCR (hospital order, performed in [facility] hospital lab) *cepheid single result test* Anterior Nasal Swab     Status: None   Collection Time: 01/13/22 12:36 PM   Specimen: Anterior Nasal Swab  Result Value Ref Range Status   SARS Coronavirus 2 by RT PCR NEGATIVE NEGATIVE Final    Comment: (NOTE) SARS-CoV-2 target nucleic acids are NOT DETECTED.  The SARS-CoV-2 RNA is generally detectable in upper and lower respiratory specimens during the acute phase of infection. The lowest concentration of SARS-CoV-2 viral copies this assay can detect is  250 copies / mL. A negative result does not preclude SARS-CoV-2 infection and should not be used as the sole basis for treatment or other patient management decisions.  A negative result may occur with improper specimen collection / handling, submission of specimen other than nasopharyngeal swab, presence of viral mutation(s) within the areas targeted by this assay, and inadequate number of viral copies (<250 copies / mL). A negative result must be combined with clinical observations, patient history, and epidemiological information.  Fact Sheet for Patients:   https://www.patel.info/  Fact Sheet for Healthcare Providers: https://hall.com/  This test is not yet approved or  cleared by the [place] FDA and has been authorized for detection and/or diagnosis of SARS-CoV-2 by FDA under an Emergency Use Authorization (EUA).  This EUA will remain in effect (meaning this test can be used) for the duration of the COVID-19 declaration under Section 564(b)(1) of the Act, 21 U.S.C. section 360bbb-3(b)(1), unless the authorization is terminated or revoked sooner.  Performed at [facility], [place] [address], [place], [place] [address]   Blood Culture (routine x 2)     Status: None (Preliminary result)   Collection Time:  01/13/22 12:39 PM   Specimen: BLOOD  Result Value Ref Range Status   Specimen Description BLOOD RIGHT HAND  Final   Special Requests   Final    BOTTLES DRAWN AEROBIC AND ANAEROBIC Blood Culture results may not be optimal due to an inadequate volume of blood received in culture bottles   Culture   Final    NO GROWTH 2 DAYS Performed at [facility], [place] [address] [place], [place] [address]    Report Status PENDING  Incomplete  Blood Culture (routine x 2)     Status: None (Preliminary result)   Collection Time: 01/13/22 12:40 PM   Specimen: BLOOD  Result Value Ref Range Status   Specimen Description BLOOD LEFT  HAND  Final   Special Requests   Final    BOTTLES DRAWN AEROBIC AND ANAEROBIC Blood Culture results may not be optimal due to an inadequate volume of blood received in culture bottles   Culture   Final    NO GROWTH 2 DAYS Performed at [facility], [place] [address] [place], [place] [address]    Report Status PENDING  Incomplete  Respiratory (~20 pathogens) panel by PCR     Status: None   Collection Time: 01/13/22  2:00 PM   Specimen: Nasopharyngeal Swab; Respiratory  Result Value Ref Range Status   Adenovirus NOT DETECTED NOT DETECTED Final   Coronavirus 229E NOT DETECTED NOT DETECTED Final    Comment: (NOTE) The Coronavirus on the Respiratory Panel, DOES NOT test for the novel  Coronavirus (2019 nCoV)    Coronavirus HKU1 NOT DETECTED NOT DETECTED Final   Coronavirus NL63 NOT DETECTED NOT DETECTED Final   Coronavirus OC43 NOT DETECTED NOT DETECTED Final   Metapneumovirus NOT DETECTED NOT DETECTED Final   Rhinovirus / Enterovirus NOT DETECTED NOT DETECTED Final   Influenza A NOT DETECTED NOT DETECTED Final   Influenza B NOT DETECTED NOT DETECTED Final   Parainfluenza Virus 1 NOT DETECTED NOT DETECTED Final   Parainfluenza Virus 2 NOT DETECTED NOT DETECTED Final   Parainfluenza Virus 3 NOT DETECTED NOT DETECTED Final   Parainfluenza Virus 4 NOT DETECTED NOT DETECTED Final   Respiratory Syncytial Virus NOT DETECTED NOT DETECTED Final   Bordetella pertussis NOT DETECTED NOT DETECTED Final   Bordetella Parapertussis NOT DETECTED NOT DETECTED Final   Chlamydophila pneumoniae NOT DETECTED NOT DETECTED Final   Mycoplasma pneumoniae NOT DETECTED NOT DETECTED Final    Comment: Performed at [facility], [place], [place], [place] [address]     Labs: BNP (last 3 results) Recent Labs    07/22/21 1134 01/13/22 1240 01/13/22 2210  BNP 18.3 209.8* [id number]   Basic Metabolic Panel: Recent Labs  Lab 01/13/22 1240 01/14/22 0335 01/16/22 0410  NA 133* 134* 135  K 3.8  3.9 3.8  CL 101 105 109  CO2 20* 19* 20*  GLUCOSE 148* 89 99  BUN 14 12 10   CREATININE 1.19 1.22 0.97  CALCIUM 8.6* 8.1* 8.4*  MG  --  1.9  --    Liver Function Tests: Recent Labs  Lab 01/13/22 1240 01/14/22 0335  AST 21 19  ALT 22 20  ALKPHOS 55 46  BILITOT 1.3* 1.1  PROT 7.3 6.4*  ALBUMIN 3.5 3.0*   No results for input(s): LIPASE, AMYLASE in the last 168 hours. No results for input(s): AMMONIA in the last 168 hours. CBC: Recent Labs  Lab 01/13/22 1240 01/14/22 0335  WBC 12.6* 11.0*  NEUTROABS  9.7*  --   HGB 16.8 15.4  HCT 49.5 45.0  MCV 94.3 92.8  PLT 120* 104*   Cardiac Enzymes: No results for input(s): CKTOTAL, CKMB, CKMBINDEX, TROPONINI in the last 168 hours. BNP: Invalid input(s): POCBNP CBG: Recent Labs  Lab 01/15/22 0803 01/15/22 1148 01/15/22 2036 01/16/22 0606 01/16/22 0720  GLUCAP 120* 107* 148* 86 93   D-Dimer No results for input(s): DDIMER in the last 72 hours. Hgb A1c No results for input(s): HGBA1C in the last 72 hours. Lipid Profile No results for input(s): CHOL, HDL, LDLCALC, TRIG, CHOLHDL, LDLDIRECT in the last 72 hours. Thyroid function studies No results for input(s): TSH, T4TOTAL, T3FREE, THYROIDAB in the last 72 hours.  Invalid input(s): FREET3 Anemia work up No results for input(s): VITAMINB12, FOLATE, FERRITIN, TIBC, IRON, RETICCTPCT in the last 72 hours. Urinalysis    Component Value Date/Time   COLORURINE YELLOW 01/13/2022 1500   APPEARANCEUR CLEAR 01/13/2022 1500   LABSPEC 1.028 01/13/2022 1500   PHURINE 5.0 01/13/2022 1500   GLUCOSEU >=500 (A) 01/13/2022 1500   HGBUR NEGATIVE 01/13/2022 1500   BILIRUBINUR NEGATIVE 01/13/2022 1500   KETONESUR 5 (A) 01/13/2022 1500   PROTEINUR NEGATIVE 01/13/2022 1500   UROBILINOGEN 1.0 08/22/2007 1224   NITRITE NEGATIVE 01/13/2022 1500   LEUKOCYTESUR NEGATIVE 01/13/2022 1500   Sepsis Labs Invalid input(s): PROCALCITONIN,  WBC,  LACTICIDVEN Microbiology Recent Results (from  the past 240 hour(s))  Urine Culture     Status: None   Collection Time: 01/13/22 12:26 PM   Specimen: In/Out Cath Urine  Result Value Ref Range Status   Specimen Description IN/OUT CATH URINE  Final   Special Requests NONE  Final   Culture   Final    NO GROWTH Performed at [facility], [place] [address] [place], [place] [address]    Report Status 01/14/2022 FINAL  Final  SARS Coronavirus 2 by RT PCR (hospital order, performed in [facility] hospital lab) *cepheid single result test* Anterior Nasal Swab     Status: None   Collection Time: 01/13/22 12:36 PM   Specimen: Anterior Nasal Swab  Result Value Ref Range Status   SARS Coronavirus 2 by RT PCR NEGATIVE NEGATIVE Final    Comment: (NOTE) SARS-CoV-2 target nucleic acids are NOT DETECTED.  The SARS-CoV-2 RNA is generally detectable in upper and lower respiratory specimens during the acute phase of infection. The lowest concentration of SARS-CoV-2 viral copies this assay can detect is 250 copies / mL. A negative result does not preclude SARS-CoV-2 infection and should not be used as the sole basis for treatment or other patient management decisions.  A negative result may occur with improper specimen collection / handling, submission of specimen other than nasopharyngeal swab, presence of viral mutation(s) within the areas targeted by this assay, and inadequate number of viral copies (<250 copies / mL). A negative result must be combined with clinical observations, patient history, and epidemiological information.  Fact Sheet for Patients:   https://www.patel.info/  Fact Sheet for Healthcare Providers: https://hall.com/  This test is not yet approved or  cleared by the [place] FDA and has been authorized for detection and/or diagnosis of SARS-CoV-2 by FDA under an Emergency Use Authorization (EUA).  This EUA will remain in effect (meaning this test can be used) for the  duration of the COVID-19 declaration under Section 564(b)(1) of the Act, 21 U.S.C. section 360bbb-3(b)(1), unless the authorization is terminated or revoked sooner.  Performed at [facility], [place]  [address], [place], [place] [address]   Blood Culture (routine x 2)     Status: None (Preliminary result)   Collection Time: 01/13/22 12:39 PM   Specimen: BLOOD  Result Value Ref Range Status   Specimen Description BLOOD RIGHT HAND  Final   Special Requests   Final    BOTTLES DRAWN AEROBIC AND ANAEROBIC Blood Culture results may not be optimal due to an inadequate volume of blood received in culture bottles   Culture   Final    NO GROWTH 2 DAYS Performed at [facility], [place] [address] [place], [place] [address]    Report Status PENDING  Incomplete  Blood Culture (routine x 2)     Status: None (Preliminary result)   Collection Time: 01/13/22 12:40 PM   Specimen: BLOOD  Result Value Ref Range Status   Specimen Description BLOOD LEFT HAND  Final   Special Requests   Final    BOTTLES DRAWN AEROBIC AND ANAEROBIC Blood Culture results may not be optimal due to an inadequate volume of blood received in culture bottles   Culture   Final    NO GROWTH 2 DAYS Performed at [facility], [place] [address] [place], [place] [address]    Report Status PENDING  Incomplete  Respiratory (~20 pathogens) panel by PCR     Status: None   Collection Time: 01/13/22  2:00 PM   Specimen: Nasopharyngeal Swab; Respiratory  Result Value Ref Range Status   Adenovirus NOT DETECTED NOT DETECTED Final   Coronavirus 229E NOT DETECTED NOT DETECTED Final    Comment: (NOTE) The Coronavirus on the Respiratory Panel, DOES NOT test for the novel  Coronavirus (2019 nCoV)    Coronavirus HKU1 NOT DETECTED NOT DETECTED Final   Coronavirus NL63 NOT DETECTED NOT DETECTED Final   Coronavirus OC43 NOT DETECTED NOT DETECTED Final   Metapneumovirus NOT DETECTED NOT DETECTED Final   Rhinovirus / Enterovirus  NOT DETECTED NOT DETECTED Final   Influenza A NOT DETECTED NOT DETECTED Final   Influenza B NOT DETECTED NOT DETECTED Final   Parainfluenza Virus 1 NOT DETECTED NOT DETECTED Final   Parainfluenza Virus 2 NOT DETECTED NOT DETECTED Final   Parainfluenza Virus 3 NOT DETECTED NOT DETECTED Final   Parainfluenza Virus 4 NOT DETECTED NOT DETECTED Final   Respiratory Syncytial Virus NOT DETECTED NOT DETECTED Final   Bordetella pertussis NOT DETECTED NOT DETECTED Final   Bordetella Parapertussis NOT DETECTED NOT DETECTED Final   Chlamydophila pneumoniae NOT DETECTED NOT DETECTED Final   Mycoplasma pneumoniae NOT DETECTED NOT DETECTED Final    Comment: Performed at [facility], [place]. [address] [place], [place] [address]    Please note: You were cared for by a hospitalist during your hospital stay. Once you are discharged, your primary care physician will handle any further medical issues. Please note that NO REFILLS for any discharge medications will be authorized once you are discharged, as it is imperative that you return to your primary care physician (or establish a relationship with a primary care physician if you do not have one) for your post hospital discharge needs so that they can reassess your need for medications and monitor your lab values.    Time coordinating discharge: 40 minutes  SIGNED:   Shelly Coss, MD  [facility] 01/16/2022, 10:52 AM Pager [phone]  If 7PM-7AM, please contact night-coverage [email or web address] Password [email or web address]

## 2022-01-16 NOTE — TOC Initial Note (Addendum)
Transition of Care [facility]) - Initial/Assessment Note    Patient Details  Name: [name] MRN: [id number] Date of Birth: Nov 12, 1955  Transition of Care [facility]) CM/SW Contact:    Marilu Favre, RN Phone Number: 01/16/2022, 12:24 PM  Clinical Narrative:                  Talked to patient via phone. Confirmed face she information.  Discussed home oxygen . [employer] will bring oxygen to hospital room prior to discharge.   Discussed OP PT . Patient prefers [facility] [place] . Asked MD to sign referral form. Will fax once signed,MD signed. NCM faxed  Expected Discharge Plan: Home/Self Care Barriers to Discharge: No Barriers Identified   Patient Goals and CMS Choice Patient states their goals for this hospitalization and ongoing recovery are:: to return to home CMS Medicare.gov Compare Post Acute Care list provided to:: Patient Choice offered to / list presented to : Patient  Expected Discharge Plan and Services Expected Discharge Plan: Home/Self Care   Discharge Planning Services: CM Consult Post Acute Care Choice:  (OP PT) Living arrangements for the past 2 months: Single Family Home Expected Discharge Date: 01/16/22               DME Arranged: Oxygen DME Agency: [employer] Date DME Agency Contacted: 01/16/22 Time DME Agency Contacted: 1223 Representative spoke with at DME Agency: [place] Arranged: NA          Prior Living Arrangements/Services Living arrangements for the past 2 months: [place]     Do you feel safe going back to the place where you live?: Yes               Activities of Daily Living      Permission Sought/Granted   Permission granted to share information with : No              Emotional Assessment              Admission diagnosis:  Acute respiratory failure with hypoxia ([place]) [J96.01] PNA (pneumonia) [J18.9] Community acquired pneumonia, unspecified laterality  [J18.9] Patient Active Problem List   Diagnosis Date Noted   PNA (pneumonia) 01/13/2022   HFrEF (heart failure with reduced ejection fraction) ([place]) 0000000   Metabolic acidosis 0000000   Acute respiratory failure with hypoxia ([place])    Community acquired pneumonia 12/21/2019   ICD (implantable cardioverter-defibrillator) in place 03/31/2019   Educated about COVID-19 virus infection 01/14/2019   Coronary artery disease involving native coronary artery of native heart without angina pectoris 01/14/2019   Ventricular tachyarrhythmia ([place]) status post Medtronic ICD-failed amiodarone/was on amiodarone 2/2 this but now is on mexiletine twice daily per [place] 01/09/2019   VT (ventricular tachycardia) ([place]) 01/09/2019   Dyspnea on effort 01/04/2019   Thrombocytopenia ([place]) 01/22/2018   Dyspnea 01/21/2018   Acute on chronic systolic CHF (congestive heart failure) ([place]) 12/25/2017   Unstable angina ([place]) 12/24/2017   Cardiomyopathy, ischemic    Essential hypertension 11/25/2012   Chronic bronchitis ([place]) 11/25/2012   Insulin dependent diabetes mellitus 11/25/2012   Bradycardia 11/25/2012   Restless leg syndrome 06/25/2011   Dyslipidemia 06/05/2011   OSA (obstructive sleep apnea) 06/05/2011   CAD S/P percutaneous coronary angioplasty 05/09/2011   PCP:  [name], FNP Pharmacy:   [facility] [place], [place] - [address] [place] [address] [place] [address]  Phone: [phone] Fax: [phone]     Social Determinants of Health (SDOH) Interventions    Readmission Risk Interventions     View : No data to display.

## 2022-01-16 NOTE — Progress Notes (Signed)
Mobility Specialist Progress Note:   01/16/22 1132  Mobility  Activity Ambulated with assistance in hallway  Level of Assistance Modified independent, requires aide device or extra time  Assistive Device None  Distance Ambulated (ft) 550 ft  Activity Response Tolerated well  $Mobility charge 1 Mobility   Pt received in chair willing to participate in mobility. No complaints of pain. Left in chair with call bell in reach and all needs met.   [facility] Jessiah Steinhart Mobility Specialist

## 2022-01-18 LAB — CULTURE, BLOOD (ROUTINE X 2)
Culture: NO GROWTH
Culture: NO GROWTH

## 2022-01-21 ENCOUNTER — Ambulatory Visit (INDEPENDENT_AMBULATORY_CARE_PROVIDER_SITE_OTHER): Payer: Medicare Other

## 2022-01-21 DIAGNOSIS — I255 Ischemic cardiomyopathy: Secondary | ICD-10-CM | POA: Diagnosis not present

## 2022-01-21 LAB — CUP PACEART REMOTE DEVICE CHECK
Battery Remaining Longevity: 104 mo
Battery Voltage: 3 V
Brady Statistic RV Percent Paced: 0.13 %
Date Time Interrogation Session: 20230606043829
HighPow Impedance: 90 Ohm
Implantable Lead Implant Date: 20191202
Implantable Lead Location: 753860
Implantable Pulse Generator Implant Date: 20191202
Lead Channel Impedance Value: 323 Ohm
Lead Channel Impedance Value: 399 Ohm
Lead Channel Pacing Threshold Amplitude: 0.875 V
Lead Channel Pacing Threshold Pulse Width: 0.4 ms
Lead Channel Sensing Intrinsic Amplitude: 2.75 mV
Lead Channel Sensing Intrinsic Amplitude: 2.75 mV
Lead Channel Setting Pacing Amplitude: 2.5 V
Lead Channel Setting Pacing Pulse Width: 0.4 ms
Lead Channel Setting Sensing Sensitivity: 0.3 mV

## 2022-02-05 NOTE — Progress Notes (Signed)
Remote ICD transmission.   

## 2022-02-13 ENCOUNTER — Encounter: Payer: Self-pay | Admitting: Pulmonary Disease

## 2022-02-13 ENCOUNTER — Ambulatory Visit (INDEPENDENT_AMBULATORY_CARE_PROVIDER_SITE_OTHER): Payer: Medicare Other | Admitting: Pulmonary Disease

## 2022-02-13 VITALS — BP 120/70 | HR 62 | Temp 97.5°F | Ht 68.0 in | Wt 206.6 lb

## 2022-02-13 DIAGNOSIS — I255 Ischemic cardiomyopathy: Secondary | ICD-10-CM | POA: Diagnosis not present

## 2022-02-13 DIAGNOSIS — J849 Interstitial pulmonary disease, unspecified: Secondary | ICD-10-CM | POA: Diagnosis not present

## 2022-02-13 NOTE — Progress Notes (Addendum)
[name]    [id number]    [birth year]  Primary Care Physician:Poe, Redmond Baseman, FNP  Referring Physician: [name], FNP [place],  [place] [address]  Chief complaint:   Pulmonary fibrosis  HPI: 66 y.o.  with hypertension, coronary artery disease, diabetes, OSA (non compliant), VT on amiodarone, ischemic cardiomyopathy Admitted to the hospital and May 2021 for respiratory failure, treated for community-acquired pneumonia Pulmonary service was consulted for high-resolution CT showing UIP fibrosis. There is a question of amiodarone toxicity and it was held during that admission and consultation with Dr. Aundra Dubin from cardiology.  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  Has a hobby of metal work and makes art with horse shoes.  He wears a mask while doing those. Smoking history: 53-pack-year smoker.  Quit in 2007 Travel history: No significant travel history Relevant family history: No significant family history of lung disease  Interim history: He was last seen in 2021 and recommendations made to start [place] but he did not follow through as he did not want additional medications and canceled return visits  Admitted on 01/16/2022 with hypoxic respiratory failure with CT scan showing progressive fibrosis and possible pneumonia.  Started on antibiotics and discharged home on supplemental oxygen.  Outpatient Encounter Medications as of 02/13/2022  Medication Sig   acetaminophen (TYLENOL) 500 MG tablet Take 500 mg by mouth every 6 (six) hours as needed (for headaches.).   albuterol (VENTOLIN HFA) 108 (90 Base) MCG/ACT inhaler Inhale 1 puff into the lungs every 6 (six) hours as needed for wheezing or shortness of breath.   Alirocumab (PRALUENT) 75 MG/ML SOAJ ADMINISTER 1 ML UNDER THE SKIN EVERY 14 DAYS (Patient  taking differently: Inject 75 mg into the skin every 14 (fourteen) days.)   aspirin 81 MG chewable tablet Chew 1 tablet (81 mg total) by mouth daily.   BD PEN NEEDLE NANO 2ND GEN 32G X 4 MM MISC See admin instructions.   carvedilol (COREG) 12.5 MG tablet Take 1.5 tablets (18.75 mg total) by mouth 2 (two) times daily with a meal.   CINNAMON PO Take 1,000 mg by mouth in the morning and at bedtime.   clopidogrel (PLAVIX) 75 MG tablet Take 1 tablet (75 mg total) by mouth daily.   Coenzyme Q10 (CO Q 10) 100 MG CAPS Take 100 mg by mouth in the morning.   empagliflozin (JARDIANCE) 25 MG TABS tablet Take 25 mg by mouth in the morning.   ENTRESTO 49-51 MG TAKE 1 TABLET BY MOUTH TWICE DAILY (Patient taking differently: Take 1 tablet by mouth in the morning and at bedtime.)   furosemide (LASIX) 40 MG tablet Take 1 tablet (40 mg total) by mouth 2 (two) times daily.   Insulin Glargine (TOUJEO SOLOSTAR [place]) Inject 50 Units into the skin at bedtime.   magnesium oxide (MAG-OX) 400 MG tablet Take 400 mg by mouth daily in the afternoon.   mexiletine (MEXITIL) 150 MG capsule TAKE 1 CAPSULE(150 MG) BY MOUTH EVERY 12 HOURS (Patient taking differently: Take 150 mg by mouth every 12 (twelve) hours.)   nitroGLYCERIN (NITROSTAT) 0.4 MG SL tablet Place 1 tablet (0.4 mg total) under the tongue every 5 (five) minutes as needed for chest pain.   NOVOLOG FLEXPEN 100 UNIT/ML FlexPen Inject 5 Units into the skin See admin instructions.  Inject 5 units into the skin in the morning before breakfast and two more times a day before meals as needed for an elevated BGL   pantoprazole (PROTONIX) 40 MG tablet Take 40 mg by mouth every evening.    pramipexole (MIRAPEX) 1 MG tablet Take 0.5 mg by mouth See admin instructions. Take 0.5 mg by mouth at 7:30 AM, 11:00 AM, 4:00 PM, and 7:30 PM   spironolactone (ALDACTONE) 25 MG tablet Take 1 tablet (25 mg total) by mouth daily.   TRELEGY ELLIPTA 100-62.5-25 MCG/ACT AEPB Take 1 puff by mouth  daily. (Patient not taking: Reported on 02/13/2022)   No facility-administered encounter medications on file as of 02/13/2022.    Allergies as of 02/13/2022 - Review Complete 02/13/2022  Allergen Reaction Noted   Brilinta [ticagrelor] Shortness Of Breath and Other (See Comments) 02/05/2018   Metformin and related Shortness Of Breath 11/09/2020   Amiodarone Other (See Comments) 01/13/2022   Codeine Other (See Comments) 12/29/2017   Lipitor [atorvastatin calcium] Other (See Comments) 05/08/2011   Niaspan [niacin er] Other (See Comments) 05/08/2011    Past Medical History:  Diagnosis Date   ASCVD (arteriosclerotic cardiovascular disease)    a. MI 2004, did not seek care at that time. b. s/p PTCA/BMS to OM1 04/2011 (known totally occluded RCA with L-R collaterals and failed angioplasty on that vessel). c.)  Cath 2019 chronically occluded right coronary artery with collateralization.  90% circumflex stenosis treated with a DES.   CHF (congestive heart failure) (HCC)    Chronic bronchitis (HCC)    HTN (hypertension)    Hyperlipidemia    Ischemic cardiomyopathy    EF 30%.     OSA (obstructive sleep apnea) 05/24/11   Restless leg syndrome 06/25/2011   Type 2 diabetes mellitus [facility])     Past Surgical History:  Procedure Laterality Date   CARDIAC CATHETERIZATION  11/24/2012   40-50% ostial LAD, 40% mid LAD, 60-70% ostial D1, 40% proximal LCx, patent OM branch stent, 99% serial proximal-mid RCA lesions, 100% mid RCA occlusion (chronic with left to right collateralization); EF 40%, inferior wall hypokinesis.   CARDIAC CATHETERIZATION N/A 05/31/2015   Procedure: Left Heart Cath and Coronary Angiography;  Surgeon: Kathleene Hazel, MD;  Location: [facility] INVASIVE CV LAB;  Service: Cardiovascular;  Laterality: N/A;   CORONARY STENT INTERVENTION N/A 12/25/2017   Procedure: CORONARY STENT INTERVENTION;  Surgeon: Swaz[place], Peter M, MD;  Location: [facility] INVASIVE CV LAB;  Service: Cardiovascular;   Laterality: N/A;   CORONARY STENT PLACEMENT  04/2011   CYSTECTOMY     Spine and jaw   ICD IMPLANT N/A 07/19/2018   Procedure: ICD IMPLANT;  Surgeon: Regan Lemming, MD;  Location: [facility] INVASIVE CV LAB;  Service: Cardiovascular;  Laterality: N/A;   LEFT HEART CATH AND CORONARY ANGIOGRAPHY N/A 12/25/2017   Procedure: LEFT HEART CATH AND CORONARY ANGIOGRAPHY;  Surgeon: Swaziland, Peter M, MD;  Location: [facility]VAS[facility] CV LAB;  Service: Cardiovascular;  Laterality: N/A;   LEFT HEART CATHETERIZATION WITH CORONARY ANGIOGRAM N/A 11/24/2012   Procedure: LEFT HEART CATHETERIZATION WITH CORONARY ANGIOGRAM;  Surgeon: Kathleene Hazel, MD;  Location: [facility] CATH LAB;  Service: Cardiovascular;  Laterality: N/A;   lesion removed from foot     right foot   RIGHT/LEFT HEART CATH AND CORONARY ANGIOGRAPHY N/A 01/06/2019   Procedure: RIGHT/LEFT HEART CATH AND CORONARY ANGIOGRAPHY;  Surgeon: Runell Gess, MD;  Location: [facility] INVASIVE CV LAB;  Service: Cardiovascular;  Laterality: N/A;   TONSILLECTOMY AND ADENOIDECTOMY  Family History  Problem Relation Age of Onset   Lymphoma Mother    Heart attack Father 54   Heart attack Brother 75    Social History   Socioeconomic History   Marital status: Married    Spouse name: Not on file   Number of children: 1   Years of education: Not on file   Highest education level: Not on file  Occupational History   Occupation: Development worker, international aid   Tobacco Use   Smoking status: Former    Packs/day: 1.50    Years: 35.00    Total pack years: 52.50    Types: Cigarettes    Quit date: 08/18/2005    Years since quitting: 16.5   Smokeless tobacco: Never  Vaping Use   Vaping Use: Never used  Substance and Sexual Activity   Alcohol use: No   Drug use: No   Sexual activity: Not Currently  Other Topics Concern   Not on file  Social History Narrative   Married with 1 grown daughter who is a Network engineer for a school         Social Determinants of Systems developer Strain: Not on file  Food Insecurity: Not on file  Transportation Needs: Not on file  Physical Activity: Not on file  Stress: Not on file  Social Connections: Not on file  Intimate Partner Violence: Not on file    Review of systems: Review of Systems  Constitutional: Negative for fever and chills.  HENT: Negative.   Eyes: Negative for blurred vision.  Respiratory: as per HPI  Cardiovascular: Negative for chest pain and palpitations.  Gastrointestinal: Negative for vomiting, diarrhea, blood per rectum. Genitourinary: Negative for dysuria, urgency, frequency and hematuria.  Musculoskeletal: Negative for myalgias, back pain and joint pain.  Skin: Negative for itching and rash.  Neurological: Negative for dizziness, tremors, focal weakness, seizures and loss of consciousness.  Endo/Heme/Allergies: Negative for environmental allergies.  Psychiatric/Behavioral: Negative for depression, suicidal ideas and hallucinations.  All other systems reviewed and are negative.  Physical Exam: Blood pressure 128/64, pulse 74, temperature 97.7 F (36.5 C), temperature source Oral, height 5\' 10"  (1.778 m), weight 207 lb 9.6 oz (94.2 kg), SpO2 95 %. Gen:      No acute distress HEENT:  EOMI, sclera anicteric Neck:     No masses; no thyromegaly Lungs:    Clear to auscultation bilaterally; normal respiratory effort CV:         Regular rate and rhythm; no murmurs Abd:      + bowel sounds; soft, non-tender; no palpable masses, no distension Ext:    No edema; adequate peripheral perfusion Skin:      Warm and dry; no rash Neuro: alert and oriented x 3 Psych: normal mood and affect  Data Reviewed: Imaging: High-resolution CT 12/13/2019-basilar predominant fibrotic lung disease UIP pattern with progression compared to 2020.   CTA 01/13/2022-no pulmonary embolism, progressive fibrotic lung disease with honeycombing, focal airspace consolidation in the left lower lobe.  I have reviewed the images  personally  PFTs:  Labs: Autoimmune work-up 12/23/2019-ANA, rheumatoid factor, CCP, myositis panel, aldolase, CK-negative  Cardiac: RHC 01/06/19 Pulmonary artery pressure- 25/5, mean 14 Pulmonary wedge pressure- A-wave 7, V wave 6, mean 6 LVEDP- 7 Cardiac output-3.95 L/min  Assessment:  Pulmonary fibrosis in UIP pattern Has remote asbestos exposure, smoking history and amiodarone use   CT does not look like amiodarone toxicity.  But have stopped amio in consultation with Dr. Aundra Dubin cardiology given significant  underlying lung disease. Differential diagnosis most likely IPF versus progressive asbestosis I think asbestosis is less likely as he does not have any pleural disease and exposure was 50 years ago. Regardless of etiology as he is candidate for antifibrotic therapy due to progressive phenotype.  Esbriet recommended at last visit but patient canceled follow-up visits.  Unfortunately now his CT shows progressive fibrosis and he is requiring supplemental oxygen We had a detailed discussion about his disease trajectory and treatment options with him and his wife.  They still seem reluctant to try therapies but given progression they have agreed to initiate Esbriet. Recent LFTs are normal  We also talked about possibility of lung transplantation.  They do not want this at any cost Continue supplemental oxygen.  Qualify for portable concentrator  Schedule high-res CT and PFTs for follow-up  Plan/Recommendations: Start Esbriet Schedule high-res CT and PFTs for follow  Chilton Greathouse MD [facility] 02/13/2022, 10:49 AM  CC: Jerrye Bushy, FNP

## 2022-02-13 NOTE — Patient Instructions (Addendum)
We will check you for portable oxygen concentrator Start paperwork for Esbriet Schedule high-res CT and PFTs in 2 months and follow-up in clinic after that

## 2022-02-14 ENCOUNTER — Telehealth: Payer: Self-pay

## 2022-02-14 NOTE — Telephone Encounter (Signed)
Received New start paperwork for ESBRIET. Will update as we work through the benefits process.  Submitted a Prior Authorization request to [facility] for ESBRIET via CoverMyMeds. Will update once we receive a response.   Key: [id number]

## 2022-02-17 ENCOUNTER — Other Ambulatory Visit (HOSPITAL_COMMUNITY): Payer: Self-pay

## 2022-02-17 NOTE — Telephone Encounter (Signed)
Received notification from [facility] regarding a prior authorization for ESBRIET Capsules. Authorization has been APPROVED from 08/18/2021 to 02/17/2023. Approval letter sent to scan center.  Per test claim, copay for 30 days supply is $564.28. Price for generic is $450.92  Patient can fill through [facility]: [phone]   Authorization # [id number]  Submitted Patient Assistance Application to [facility] for ESBRIET along with provider portion, PA and income documents. Will update patient when we receive a response.  Fax# [phone] Phone# [phone]

## 2022-02-17 NOTE — Telephone Encounter (Signed)
Received a fax regarding Prior Authorization from [facility] for ESBRIET. Authorization has been DENIED because "preferred formulary alternatives have not been tried". Name-brand Esbriet capsules are on the list of preferred alternatives, therefore I will submit a f/u request for that instead.  Submitted a Prior Authorization request to [facility] for ESBRIET CAPSULES via CoverMyMeds. Will update once we receive a response.   Key: [id number]

## 2022-02-25 ENCOUNTER — Telehealth: Payer: Self-pay | Admitting: Pharmacist

## 2022-02-25 ENCOUNTER — Telehealth: Payer: Self-pay | Admitting: Pulmonary Disease

## 2022-02-25 DIAGNOSIS — Z5181 Encounter for therapeutic drug level monitoring: Secondary | ICD-10-CM

## 2022-02-25 DIAGNOSIS — J849 Interstitial pulmonary disease, unspecified: Secondary | ICD-10-CM

## 2022-02-25 MED ORDER — PIRFENIDONE 267 MG PO TABS: ORAL_TABLET | ORAL | 0 refills | Status: DC

## 2022-02-25 MED ORDER — PIRFENIDONE 267 MG PO TABS: 534.0000 mg | ORAL_TABLET | Freq: Three times a day (TID) | ORAL | 0 refills | Status: DC

## 2022-02-25 NOTE — Telephone Encounter (Signed)
spouse is calling stating ct scan was done 7/7 and wants results. also states an rx for ebriet  was suppose to be sent to [facility] but has received 2 different phone calls saying it has been approved through insurance and another saying its denied . pt spouse would like an update

## 2022-02-25 NOTE — Telephone Encounter (Signed)
Subjective:  Patient and his wife, Vaughan Basta, called today [facility] pharmacy team for Esbriet new start.   Patient was last seen by Dr. Vaughan Browner on 02/13/22.  He has differential diagnosis of IPF vs progressive asbestosis - but does not have progressive phenotype. He has deferred antifibrotics due to side effects but is now willing to try due to ILD progression.  Per wife, patient enjoys spending a significant portion of his time in sun. He had to be regimented about wearing sunscreen when he was taking amiodarone as well.  Today, patient continues to report reluctance with starting Esbriet due to side effects - specifically mentions risk for fatigue.  Patient's wife manages his medications. She packs his pillbox and states that he does not take anything that's not in his pillbox. She does report some concern with packing Esbriet due to number of tablets and initial titration period but she is willing to do it.  History of elevated LFTs: No History of diarrhea, nausea, vomiting: No  Objective: Allergies  Allergen Reactions   Brilinta [Ticagrelor] Shortness Of Breath and Other (See Comments)    Intolerable dyspnea-changed to Plavix   Metformin And Related Shortness Of Breath   Amiodarone Other (See Comments)    SCARS THE LUNGS   Codeine Other (See Comments)    Lightheaded, vision issues   Lipitor [Atorvastatin Calcium] Other (See Comments)    Muscle pain   Niaspan [Niacin Er] Other (See Comments)    Made the head hurt    Outpatient Encounter Medications as of 02/25/2022  Medication Sig Note   Pirfenidone (ESBRIET) 267 MG TABS Take one tablet three times daily for 1 week, then increase to 2 tablets three times daily thereafter. **LOW DOSE due to concomitant mexilitine use**    acetaminophen (TYLENOL) 500 MG tablet Take 500 mg by mouth every 6 (six) hours as needed (for headaches.).    albuterol (VENTOLIN HFA) 108 (90 Base) MCG/ACT inhaler Inhale 1 puff into the lungs every 6 (six)  hours as needed for wheezing or shortness of breath.    Alirocumab (PRALUENT) 75 MG/ML SOAJ ADMINISTER 1 ML UNDER THE SKIN EVERY 14 DAYS (Patient taking differently: Inject 75 mg into the skin every 14 (fourteen) days.) 01/13/2022: Patient wants to take his next injection AT HOME- NOT while hospitalized (15th and 30th of each month)   aspirin 81 MG chewable tablet Chew 1 tablet (81 mg total) by mouth daily.    BD PEN NEEDLE NANO 2ND GEN 32G X 4 MM MISC See admin instructions.    carvedilol (COREG) 12.5 MG tablet Take 1.5 tablets (18.75 mg total) by mouth 2 (two) times daily with a meal.    CINNAMON PO Take 1,000 mg by mouth in the morning and at bedtime.    clopidogrel (PLAVIX) 75 MG tablet Take 1 tablet (75 mg total) by mouth daily.    Coenzyme Q10 (CO Q 10) 100 MG CAPS Take 100 mg by mouth in the morning.    empagliflozin (JARDIANCE) 25 MG TABS tablet Take 25 mg by mouth in the morning.    ENTRESTO 49-51 MG TAKE 1 TABLET BY MOUTH TWICE DAILY (Patient taking differently: Take 1 tablet by mouth in the morning and at bedtime.)    furosemide (LASIX) 40 MG tablet Take 1 tablet (40 mg total) by mouth 2 (two) times daily. 01/13/2022: Morning and Afternoon (4 PM)   Insulin Glargine (TOUJEO SOLOSTAR [place]) Inject 50 Units into the skin at bedtime.    magnesium oxide (MAG-OX)  400 MG tablet Take 400 mg by mouth daily in the afternoon.    mexiletine (MEXITIL) 150 MG capsule TAKE 1 CAPSULE(150 MG) BY MOUTH EVERY 12 HOURS (Patient taking differently: Take 150 mg by mouth every 12 (twelve) hours.)    nitroGLYCERIN (NITROSTAT) 0.4 MG SL tablet Place 1 tablet (0.4 mg total) under the tongue every 5 (five) minutes as needed for chest pain.    NOVOLOG FLEXPEN 100 UNIT/ML FlexPen Inject 5 Units into the skin See admin instructions. Inject 5 units into the skin in the morning before breakfast and two more times a day before meals as needed for an elevated BGL    pantoprazole (PROTONIX) 40 MG tablet Take 40 mg by mouth every  evening.     pramipexole (MIRAPEX) 1 MG tablet Take 0.5 mg by mouth See admin instructions. Take 0.5 mg by mouth at 7:30 AM, 11:00 AM, 4:00 PM, and 7:30 PM 01/13/2022: The patient takes 0.5 mg FOUR times a day    spironolactone (ALDACTONE) 25 MG tablet Take 1 tablet (25 mg total) by mouth daily.    TRELEGY ELLIPTA 100-62.5-25 MCG/ACT AEPB Take 1 puff by mouth daily. (Patient not taking: Reported on 02/13/2022)    No facility-administered encounter medications on file as of 02/25/2022.     Immunization History  Administered Date(s) Administered   Influenza,inj,Quad PF,6+ Mos 05/26/2018, 07/18/2019   Influenza-Unspecified 06/18/2012   Moderna Sars-Covid-2 Vaccination 11/09/2019, 12/12/2019   Pneumococcal-Unspecified 08/18/2010      PFT's No results found for: "FEV1", "FVC", "FEV1FVC", "TLC", "DLCO"    CMP     Component Value Date/Time   NA 135 01/16/2022 0410   NA 137 05/04/2019 0941   K 3.8 01/16/2022 0410   CL 109 01/16/2022 0410   CO2 20 (L) 01/16/2022 0410   GLUCOSE 99 01/16/2022 0410   BUN 10 01/16/2022 0410   BUN 15 05/04/2019 0941   CREATININE 0.97 01/16/2022 0410   CREATININE 0.79 05/25/2015 0825   CALCIUM 8.4 (L) 01/16/2022 0410   PROT 6.4 (L) 01/14/2022 0335   ALBUMIN 3.0 (L) 01/14/2022 0335   AST 19 01/14/2022 0335   ALT 20 01/14/2022 0335   ALKPHOS 46 01/14/2022 0335   BILITOT 1.1 01/14/2022 0335   GFRNONAA >60 01/16/2022 0410   GFRAA >60 02/24/2020 1055    CBC    Component Value Date/Time   WBC 11.0 (H) 01/14/2022 0335   RBC 4.85 01/14/2022 0335   HGB 15.4 01/14/2022 0335   HGB 16.5 03/31/2019 1557   HCT 45.0 01/14/2022 0335   HCT 47.0 03/31/2019 1557   PLT 104 (L) 01/14/2022 0335   PLT 146 (L) 03/31/2019 1557   MCV 92.8 01/14/2022 0335   MCV 93 03/31/2019 1557   MCH 31.8 01/14/2022 0335   MCHC 34.2 01/14/2022 0335   RDW 13.2 01/14/2022 0335   RDW 13.6 03/31/2019 1557   LYMPHSABS 1.6 01/13/2022 1240   LYMPHSABS 3.5 (H) 03/31/2019 1557   MONOABS  1.1 (H) 01/13/2022 1240   EOSABS 0.0 01/13/2022 1240   EOSABS 0.3 03/31/2019 1557   BASOSABS 0.1 01/13/2022 1240   BASOSABS 0.1 03/31/2019 1557   LFT's    Latest Ref Rng & Units 01/14/2022    3:35 AM 01/13/2022   12:40 PM 10/17/2019    2:52 PM  Hepatic Function  Total Protein 6.5 - 8.1 g/dL 6.4  7.3  7.1   Albumin 3.5 - 5.0 g/dL 3.0  3.5  4.0   AST 15 - 41 U/L 19  21  31   ALT 0 - 44 U/L 20  22  44   Alk Phosphatase 38 - 126 U/L 46  55  45   Total Bilirubin 0.3 - 1.2 mg/dL 1.1  1.3  0.8     HRCT (12/13/19) - Spectrum of findings compatible with basilar predominant fibrotic interstitial lung disease with mild honeycombing, with interval progression since 01/04/2019 chest CT. Findings are consistent with UIP per consensus guidelines  Assessment and Plan  Esbriet Medication Management Thoroughly counseled patient on the efficacy, mechanism of action, dosing, administration, adverse effects, and monitoring parameters of Esbriet.  Patient verbalized understanding. Patient education handout provided.   Goals of Therapy: Will not stop or reverse the progression of ILD. It will slow the progression of ILD.   Dosing: Starting dose will be Esbriet 267 mg 1 tablet three times daily for 7 days, then 2 tablets three times daily for 7 days, then 3 tablets three times daily.  Maintenance dose will be 801 mg 1 tablet three times daily if tolerated.  Stressed the importance of taking with meals and space at least 5-6 hours apart to minimize stomach upset.   Adverse Effects: Nausea, vomiting, diarrhea, weight loss Abdominal pain GERD Sun sensitivity/rash - patient advised to wear sunscreen when exposed to sunlight, specifically SPF 50 or higher Dizziness  Fatigue   Monitoring: Monitor for diarrhea, nausea and vomiting, GI perforation, hepatotoxicity  Monitor LFTs - baseline, monthly for first 6 months, then every 3 months routinely. Future order placed. Pt can have drawn at [facility] in [place].  Patient's wife advised to call our clinic so we can fax lab orders to [facility].  Access: Approval of Esbriet through: patient assistance Rx sent to: [place] [facility], dispensing) for Esbriet: [phone] Patient's wife to read directions on bottle carefully and pack pillbox as directed. She is aware of reduced dose of Esbriet due to DDI with mexiletine  Medication Reconciliation A drug regimen assessment was performed, including review of allergies, interactions, disease-state management, dosing and immunization history. Medications were reviewed with the patient, including name, instructions, indication, goals of therapy, potential side effects, importance of adherence, and safe use.  Patient will need to remain on low dose of Esbriet 571m three times daily due to DDI with mexiletine which inhibits of CYP1A2-mediated pirfenidone metabolism. Increased risk of GI side effects and risk for hepatotoxicity.  This appointment required 30 minutes of patient care (this includes precharting, chart review, review of results, face-to-face care, etc.).  Thank you for involving pharmacy to assist in providing this patient's care.    DKnox Saliva PharmD, MPH, BCPS, CPP Clinical Pharmacist (Rheumatology and Pulmonology)

## 2022-02-25 NOTE — Telephone Encounter (Signed)
Called and spoke with pt's spouse Bonita Quin who states pt had a CT performed at [facility] 7/7 which is available to be viewed in PACS. Bonita Quin is requesting to know the results of the CT. I let her know that we would send this to Dr. Isaiah Serge for him to review and once he let us know the results that we would call them to let them know and she verbalized understanding.  Dr. Isaiah Serge, please advise.

## 2022-02-25 NOTE — Telephone Encounter (Addendum)
Called [place] for update on patient's Esbriet patient assistance application. Received a verbal confirmation from  [place] regarding an approval for ESBRIET patient assistance from 02/24/22 until patient is no longer on therapy or no longer qualifies for program.  Per rep, a VM was left with patient requesting return call to complete onboarding  Phone: [phone], option 5 [employer] phone: [phone]  Counseled patient and his wife, Bonita Quin, in separate telephone encounter  Chesley Mires, PharmD, MPH, BCPS, CPP Clinical Pharmacist (Rheumatology and Pulmonology)

## 2022-02-25 NOTE — Telephone Encounter (Signed)
I reviewed the CT which shows slight worsening of scarring in the lung which is expected for his diagnosis of pulmonary fibrosis.  I will CC this message to our pharmacy team to check on the status of Esbriet approval.

## 2022-02-25 NOTE — Telephone Encounter (Signed)
Spoke with pt's spouse Bonita Quin who is requesting an update on the  Esbriet medication in regards to any update (approval/denial, cost, etc). Please advise.

## 2022-02-26 NOTE — Telephone Encounter (Signed)
Patients wife is wanting a further explanation of how bad the scarring of the lungs are?  She wants to know in a general sense how bad the scarring is?  And she also wants to know if Dr Isaiah Serge wants him on the Esbreit.  She states that she is concerned with all the side effects noted with the medication.   Please advise sir

## 2022-02-28 NOTE — Telephone Encounter (Signed)
I called to discuss results in person but [name] was busy and requested a call back at a later time

## 2022-04-07 ENCOUNTER — Other Ambulatory Visit (HOSPITAL_COMMUNITY): Payer: Self-pay | Admitting: Internal Medicine

## 2022-04-22 ENCOUNTER — Ambulatory Visit (INDEPENDENT_AMBULATORY_CARE_PROVIDER_SITE_OTHER): Payer: Medicare Other

## 2022-04-22 DIAGNOSIS — I255 Ischemic cardiomyopathy: Secondary | ICD-10-CM

## 2022-04-22 DIAGNOSIS — I5022 Chronic systolic (congestive) heart failure: Secondary | ICD-10-CM

## 2022-04-25 ENCOUNTER — Ambulatory Visit: Payer: Medicare Other | Admitting: Pulmonary Disease

## 2022-04-28 ENCOUNTER — Telehealth: Payer: Self-pay | Admitting: Pulmonary Disease

## 2022-04-28 NOTE — Telephone Encounter (Signed)
Patient was scheduled PFT and follow up OV with Dr. Isaiah Serge 04/25/22.  Patient was a no show for both. ATC patient and left detailed message to reschedule PFT and OV with Dr. Isaiah Serge to follow up with Esbriet.

## 2022-04-29 LAB — CUP PACEART REMOTE DEVICE CHECK
Battery Remaining Longevity: 100 mo
Battery Voltage: 2.99 V
Brady Statistic RV Percent Paced: 0.13 %
Date Time Interrogation Session: 20230905022704
HighPow Impedance: 75 Ohm
Implantable Lead Implant Date: 20191202
Implantable Lead Location: 753860
Implantable Pulse Generator Implant Date: 20191202
Lead Channel Impedance Value: 266 Ohm
Lead Channel Impedance Value: 342 Ohm
Lead Channel Pacing Threshold Amplitude: 0.875 V
Lead Channel Pacing Threshold Pulse Width: 0.4 ms
Lead Channel Sensing Intrinsic Amplitude: 2.375 mV
Lead Channel Sensing Intrinsic Amplitude: 2.375 mV
Lead Channel Setting Pacing Amplitude: 2.5 V
Lead Channel Setting Pacing Pulse Width: 0.4 ms
Lead Channel Setting Sensing Sensitivity: 0.3 mV

## 2022-04-30 NOTE — Telephone Encounter (Signed)
ATC patient.  LM for patient to call office to schedule OV with Dr. Isaiah Serge and PFT.

## 2022-04-30 NOTE — Telephone Encounter (Signed)
Message routed to front desk pool to assist with scheduling.

## 2022-05-08 ENCOUNTER — Other Ambulatory Visit (HOSPITAL_COMMUNITY): Payer: Self-pay

## 2022-05-08 DIAGNOSIS — I509 Heart failure, unspecified: Secondary | ICD-10-CM

## 2022-05-08 MED ORDER — SPIRONOLACTONE 25 MG PO TABS: 25.0000 mg | ORAL_TABLET | Freq: Every day | ORAL | 3 refills | Status: DC

## 2022-05-11 ENCOUNTER — Other Ambulatory Visit (HOSPITAL_COMMUNITY): Payer: Self-pay | Admitting: Cardiology

## 2022-05-15 ENCOUNTER — Ambulatory Visit (HOSPITAL_COMMUNITY)
Admission: RE | Admit: 2022-05-15 | Discharge: 2022-05-15 | Disposition: A | Discharge status: Home / Self Care | Payer: Medicare Other | Source: Ambulatory Visit | Attending: Cardiology | Admitting: Cardiology

## 2022-05-15 VITALS — BP 118/80 | HR 68 | Wt 213.0 lb

## 2022-05-15 DIAGNOSIS — Z7982 Long term (current) use of aspirin: Secondary | ICD-10-CM | POA: Diagnosis not present

## 2022-05-15 DIAGNOSIS — I110 Hypertensive heart disease with heart failure: Secondary | ICD-10-CM | POA: Insufficient documentation

## 2022-05-15 DIAGNOSIS — Z955 Presence of coronary angioplasty implant and graft: Secondary | ICD-10-CM | POA: Insufficient documentation

## 2022-05-15 DIAGNOSIS — Z8679 Personal history of other diseases of the circulatory system: Secondary | ICD-10-CM | POA: Diagnosis not present

## 2022-05-15 DIAGNOSIS — J984 Other disorders of lung: Secondary | ICD-10-CM | POA: Insufficient documentation

## 2022-05-15 DIAGNOSIS — Z79899 Other long term (current) drug therapy: Secondary | ICD-10-CM | POA: Insufficient documentation

## 2022-05-15 DIAGNOSIS — M791 Myalgia, unspecified site: Secondary | ICD-10-CM | POA: Diagnosis not present

## 2022-05-15 DIAGNOSIS — I5022 Chronic systolic (congestive) heart failure: Secondary | ICD-10-CM

## 2022-05-15 DIAGNOSIS — Z87891 Personal history of nicotine dependence: Secondary | ICD-10-CM | POA: Insufficient documentation

## 2022-05-15 DIAGNOSIS — I5023 Acute on chronic systolic (congestive) heart failure: Secondary | ICD-10-CM

## 2022-05-15 DIAGNOSIS — Z7984 Long term (current) use of oral hypoglycemic drugs: Secondary | ICD-10-CM | POA: Diagnosis not present

## 2022-05-15 DIAGNOSIS — G4733 Obstructive sleep apnea (adult) (pediatric): Secondary | ICD-10-CM | POA: Diagnosis not present

## 2022-05-15 DIAGNOSIS — J849 Interstitial pulmonary disease, unspecified: Secondary | ICD-10-CM | POA: Diagnosis not present

## 2022-05-15 DIAGNOSIS — I255 Ischemic cardiomyopathy: Secondary | ICD-10-CM | POA: Insufficient documentation

## 2022-05-15 DIAGNOSIS — Z7902 Long term (current) use of antithrombotics/antiplatelets: Secondary | ICD-10-CM | POA: Diagnosis not present

## 2022-05-15 DIAGNOSIS — I2510 Atherosclerotic heart disease of native coronary artery without angina pectoris: Secondary | ICD-10-CM | POA: Diagnosis present

## 2022-05-15 DIAGNOSIS — I452 Bifascicular block: Secondary | ICD-10-CM | POA: Diagnosis not present

## 2022-05-15 LAB — BASIC METABOLIC PANEL
Anion gap: 6 (ref 5–15)
BUN: 17 mg/dL (ref 8–23)
CO2: 24 mmol/L (ref 22–32)
Calcium: 8.2 mg/dL — ABNORMAL LOW (ref 8.9–10.3)
Chloride: 99 mmol/L (ref 98–111)
Creatinine, Ser: 1.03 mg/dL (ref 0.61–1.24)
GFR, Estimated: >60 mL/min (ref >60)
Glucose, Bld: 281 mg/dL — ABNORMAL HIGH (ref 70–99)
Potassium: 3.7 mmol/L (ref 3.5–5.1)
Sodium: 129 mmol/L — ABNORMAL LOW (ref 135–145)

## 2022-05-15 LAB — BRAIN NATRIURETIC PEPTIDE: B Natriuretic Peptide: 37 pg/mL (ref 0.0–100.0)

## 2022-05-15 NOTE — Patient Instructions (Addendum)
Take 60 mg of lasix Twice daily for 3 days, then go back to 40 mg Twice daily  Labs done today, your results will be available in MyChart, we will contact you for abnormal readings.  Your physician recommends that you schedule a follow-up appointment in: 3 months  If you have any questions or concerns before your next appointment please send [place] a message through [place] or call our office at [phone].    TO LEAVE A MESSAGE FOR THE NURSE SELECT OPTION 2, PLEASE LEAVE A MESSAGE INCLUDING: YOUR NAME DATE OF BIRTH CALL BACK NUMBER REASON FOR CALL**this is important as we prioritize the call backs  YOU WILL RECEIVE A CALL BACK THE SAME DAY AS LONG AS YOU CALL BEFORE 4:00 PM  At the [facility], you and your health needs are our priority. As part of our continuing mission to provide you with exceptional heart care, we have created designated Provider Care Teams. These Care Teams include your primary Cardiologist (physician) and Advanced Practice Providers (APPs- Physician Assistants and Nurse Practitioners) who all work together to provide you with the care you need, when you need it.   You may see any of the following providers on your designated Care Team at your next follow up: Dr Glori Bickers Dr Loralie Champagne Dr. Roxana Hires, NP Lyda Jester, [place] [facility] [place], [place] Forestine Na, NP Audry Riles, PharmD   Please be sure to bring in all your medications bottles to every appointment.

## 2022-05-15 NOTE — Progress Notes (Addendum)
Remote ICD transmission.   

## 2022-05-15 NOTE — Progress Notes (Signed)
ReDS Vest / Clip - 05/15/22 1200       ReDS Vest / Clip   Station Marker D    Ruler Value 32    ReDS Value Range High volume overload    ReDS Actual Value 56    Anatomical Comments sitting

## 2022-05-18 NOTE — Progress Notes (Addendum)
ADVANCED HF CLINIC NOTE  PCP: Jerrye Bushy, FNP Cardiology: Dr. Antoine Poche HF Cardiology: Dr. Shirlee Latch  66 y.o. with history of CAD, ischemic cardiomyopathy/chronic systolic CHF, OSA, and VT was referred by Dr. Antoine Poche for evaluation of CHF.  Patient has a long history of CAD (see PMH below).  Last cath in 5/20 showed a chronically occluded RCA with collaterals and stable stents in the LCx system, no intervention.  He has had an ischemic cardiomyopathy with EF 20-25% in 5/20, and actually up to 35-40% on 2/21 echo (I reviewed images today).  He has a Medtronic ICD and is on amiodarone for VT. He has significant OSA but has not been able to tolerate CPAP with a facemask.    CPX was done in 3/21, study was submaximal with moderate HF limitation but also functional limitation from pulmonary restriction and exertional hypoxemia.  V/Q scan was done and was not suggestive of chronic PE.  Zio patch in 3/21 showed 2% PVCs.   He was admitted in 5/21 with PNA on a background of interstitial lung disease (looks like UIP on high resolution CT chest from 4/21).  Seen by Dr. Isaiah Serge with pulmonary, thought probably not amiodarone lung toxicity but we stopped amiodarone and put him on mexiletine.  He was on oxygen in the hospital.  He decided not to start Esbriet for pulmonary fibrosis.   Echo 6/22 EF 35-40% with inferior and inferolateral hypokinesis, mild RV enlargement, mildly decreased RV systolic function.   In 5/23, he was admitted with PNA.  Echo at that time showed EF 35-40%, mild MR, mildly decreased RV systolic function.   He returns today for followup of CHF.  Weight up 6 lbs.  He is using 2L home oxygen at all times.  He is short of breath walking up stairs and inclines, ok on flat ground as long as he wears his oxygen.  He has chronic orthopnea and raises head of bed.  No lightheadedness/syncope.  No chest pain.  No palpitations.   Medtronic device interrogation:  Stable thoracic impedance  currently (decreased around his birthday with eating out), no VT, no AF (personally reviewed).  ECG (personally reviewed): NSR, RBBB, LAFB  REDS clip 56%  Labs (9/20): K 4.5, creatinine 1.08 Labs (3/21): hgb 16.9, K 4.5, creatinine 1.3, AST 34, ALT 54 (mildly elevated), LDL 51, TGs 373, TSH normal.  Labs (5/21): K 3.9, creatinine 1.07 Labs (7/21): K 3.9, creatinine 0.94, LDL 46, HDL 38 Labs (3/22): K 4.1, creatinine 1.0, LDL 45, HDL 35 Labs (6/22): K 4.0, creatinine 1.0 Labs (9/22): K 4.2, creatinine 0.97 Labs (12/22): BNP 18, K 4.3, creatinine 0.84 Labs (3/23): LDL 66, TGs 294 Labs (6/23): K 3.8, creatinine 0.97  PMH: 1. CAD:  - PCI to OM1 in 2012.  - DES to LCx in 5/19.  - LHC (5/20): 50% proximal LAD, 95% proximal D1, patent LCx and OM1 stents, total occlusion of RCA with collaterals.  2. Chronic systolic CHF: Ischemic cardiomyopathy. Medtronic ICD.  - RHC (5/20): mean RA 3, PA 25/5, mean PCWP 6, CI 1.86 - Echo (5/20): EF 20-25% - Echo (2/21): EF 35-40%, mild LV dilation, RV low normal function.  - CPX (3/21): peak VO2 17.6, VE/VCO2 slope 43, RER 0.96 => submaximal, moderate HF limitation but also noted is pulmonary restriction with exertional hypoxemia.  - Echo (6/22): EF 35-40% with inferior and inferolateral hypokinesis, mild RV enlargement, mildly decreased RV systolic function. - Echo (5/23): EF 35-40%, mild MR, mildly decreased RV systolic  function. 3. VT: Now on amiodarone.  4. OSA: Has not tolerate CPAP.  5. HTN 6. Type 2 diabetes 7. Hyperlipidemia: Has not tolerated statins.  8. PVCs - Zio patch (3/21) with 2% PVCs 9. Interstitial lung disease: high resolution CT chest in 4/21 concerning for usual interstitial pneumonitis  SH: Married, lives in [place], retired, quit smoking in 2007.   Family History  Problem Relation Age of Onset   Lymphoma Mother    Heart attack Father 64   Heart attack Brother 71   ROS: All systems reviewed and negative except as per HPI.    Current Outpatient Medications  Medication Sig Dispense Refill   acetaminophen (TYLENOL) 500 MG tablet Take 500 mg by mouth every 6 (six) hours as needed (for headaches.).     albuterol (VENTOLIN HFA) 108 (90 Base) MCG/ACT inhaler Inhale 1 puff into the lungs every 6 (six) hours as needed for wheezing or shortness of breath. 8 g 0   Alirocumab (PRALUENT) 75 MG/ML SOAJ ADMINISTER 1 ML UNDER THE SKIN EVERY 14 DAYS (Patient taking differently: Inject 75 mg into the skin every 14 (fourteen) days.) 2 mL 11   aspirin 81 MG chewable tablet Chew 1 tablet (81 mg total) by mouth daily.     BD PEN NEEDLE NANO 2ND GEN 32G X 4 MM MISC See admin instructions.     carvedilol (COREG) 12.5 MG tablet TAKE 1 AND 1/2 TABLETS BY MOUTH TWICE DAILY WITH A MEAL 90 tablet 3   CINNAMON PO Take 1,000 mg by mouth in the morning and at bedtime.     clopidogrel (PLAVIX) 75 MG tablet Take 1 tablet (75 mg total) by mouth daily. 90 tablet 3   Coenzyme Q10 (CO Q 10) 100 MG CAPS Take 100 mg by mouth in the morning.     empagliflozin (JARDIANCE) 25 MG TABS tablet Take 25 mg by mouth in the morning.     ENTRESTO 49-51 MG TAKE 1 TABLET BY MOUTH TWICE DAILY 60 tablet 11   furosemide (LASIX) 40 MG tablet Take 1 tablet (40 mg total) by mouth 2 (two) times daily. NEEDS FOLLOW UP APPOINTMENT FOR MORE REFILLS 30 tablet 6   Insulin Glargine (TOUJEO SOLOSTAR [place]) Inject 50 Units into the skin at bedtime.     magnesium oxide (MAG-OX) 400 MG tablet Take 400 mg by mouth daily in the afternoon.     mexiletine (MEXITIL) 150 MG capsule TAKE 1 CAPSULE(150 MG) BY MOUTH EVERY 12 HOURS (Patient taking differently: Take 150 mg by mouth every 12 (twelve) hours.) 180 capsule 3   nitroGLYCERIN (NITROSTAT) 0.4 MG SL tablet Place 1 tablet (0.4 mg total) under the tongue every 5 (five) minutes as needed for chest pain. 30 tablet 1   NOVOLOG FLEXPEN 100 UNIT/ML FlexPen Inject 5 Units into the skin See admin instructions. Inject 5 units into the skin in the  morning before breakfast and two more times a day before meals as needed for an elevated BGL     pantoprazole (PROTONIX) 40 MG tablet Take 40 mg by mouth every evening.      pramipexole (MIRAPEX) 1 MG tablet Take 0.5 mg by mouth See admin instructions. Take 0.5 mg by mouth at 7:30 AM, 11:00 AM, 4:00 PM, and 7:30 PM     spironolactone (ALDACTONE) 25 MG tablet Take 1 tablet (25 mg total) by mouth daily. 90 tablet 3   Pirfenidone (ESBRIET) 267 MG TABS Take one tablet three times daily for 1 week, then increase to  2 tablets three times daily thereafter. **LOW DOSE due to concomitant mexilitine use** (Patient not taking: Reported on 05/15/2022) 159 tablet 0   Pirfenidone (ESBRIET) 267 MG TABS Take 2 tablets (534 mg total) by mouth 3 (three) times daily with meals. MAINTENANCE DOSE RX **LOW DOSE due to concomitant mexilitine use** (Patient not taking: Reported on 05/15/2022) 540 tablet 0   TRELEGY ELLIPTA 100-62.5-25 MCG/ACT AEPB Take 1 puff by mouth daily. (Patient not taking: Reported on 02/13/2022)     No current facility-administered medications for this encounter.   Wt Readings from Last 3 Encounters:  05/15/22 96.6 kg (213 lb)  02/13/22 93.7 kg (206 lb 9.6 oz)  01/16/22 94.8 kg (209 lb)   BP 118/80   Pulse 68   Wt 96.6 kg (213 lb)   SpO2 93%   BMI 32.39 kg/m  General: NAD Neck: No JVD, no thyromegaly or thyroid nodule.  Lungs: Mild dry crackles at bases.  CV: Nondisplaced PMI.  Heart regular S1/S2, no S3/S4, no murmur.  Trace ankle edema.  No carotid bruit.  Normal pedal pulses.  Abdomen: Soft, nontender, no hepatosplenomegaly, no distention.  Skin: Intact without lesions or rashes.  Neurologic: Alert and oriented x 3.  Psych: Normal affect. Extremities: No clubbing or cyanosis.  HEENT: Normal.   Assessment/Plan: 1. CAD: Cath in 5/20 showed chronically occluded RCA with patent LCx system stents.  No interventional target.  No chest pain.  He does not take statins due to myalgias.  -  Continue ASA 81 daily and Plavix 75 mg daily.   - Continue Praluent, good LDL in 3/23.   2. Chronic systolic CHF: Ischemic cardiomyopathy.  Medtronic ICD.  Narrow QRS so not CRT candidate.  Echo in 5/20 with EF 20-25%, but echo in 2/21 looked better, with EF 35-40%.  CPX in 3/21 with moderate HF limitation.  Echo 6/22 with EF 35-40%. Echo in 5/23 with stable EF 35-40%. Currently, NYHA class II symptoms.  Based on exam and Optivol, he is not volume overloaded.  REDS clip reads 56% but think this is likely inaccurate with his underlying lung parenchymal disease (ILD).      - Continue Entresto 49/51 mg bid (unable to tolerate higher dose).  - With recent volume overload by Optivol (now resolved) and elevation in REDS clip (though think probably not accurate), I will increase his Lasix transiently to 60 mg bid x 3 days.  After that, he will continue Lasix 40 mg bid. BMET today.  - Continue spironolactone 25 mg daily.  - Continue Coreg 18.75 mg bid, he does not want to try to increase due to history of lightheadedness.  - Continue empagliflozin.  3. OSA: Untreated, sounds severe based on his wife's description.  He has not been able to tolerate CPAP in the past and is not willing to try again. I suspect this is a major reason why he is tired/sleepy during the day.  4. PVCs: History of PVCs, now on mexiletine. Most recent Zio patch in 3/21 with only 2% PVCs.  There was question of amiodarone lung toxicity during 5/21 admission and amiodarone was stopped, but pulmonary consult thought more likely UIP. He denies palpitations.  - Continue mexiletine.  5. ILD: Restrictive PFTs noted with CPX.  CT chest in 4/21 concerning for UIP.  I stopped amiodarone due to concern that this could represent amiodarone lung toxicity, but pulmonary thought less likely than UIP.  He saw pulmonary and decided against treatment with Esbriet. ILD appears progressive, now on  home oxygen 2L [place].  - He continues to follow with pulmonary.    Followup in 3 months.   Loralie Champagne 05/18/2022

## 2022-05-27 ENCOUNTER — Encounter: Payer: Self-pay | Admitting: Cardiology

## 2022-07-15 ENCOUNTER — Other Ambulatory Visit (HOSPITAL_COMMUNITY): Payer: Self-pay | Admitting: Internal Medicine

## 2022-07-18 ENCOUNTER — Other Ambulatory Visit (HOSPITAL_COMMUNITY): Payer: Self-pay | Admitting: *Deleted

## 2022-07-18 MED ORDER — MEXILETINE HCL 150 MG PO CAPS: ORAL_CAPSULE | ORAL | 3 refills | Status: DC

## 2022-07-22 ENCOUNTER — Ambulatory Visit (INDEPENDENT_AMBULATORY_CARE_PROVIDER_SITE_OTHER): Payer: Medicare Other

## 2022-07-22 ENCOUNTER — Other Ambulatory Visit: Payer: Self-pay | Admitting: Cardiology

## 2022-07-22 DIAGNOSIS — I255 Ischemic cardiomyopathy: Secondary | ICD-10-CM | POA: Diagnosis not present

## 2022-07-22 LAB — CUP PACEART REMOTE DEVICE CHECK
Battery Remaining Longevity: 95 mo
Battery Voltage: 2.99 V
Brady Statistic RV Percent Paced: 0.04 %
Date Time Interrogation Session: 20231205001703
HighPow Impedance: 82 Ohm
Implantable Lead Connection Status: 753985
Implantable Lead Implant Date: 20191202
Implantable Lead Location: 753860
Implantable Pulse Generator Implant Date: 20191202
Lead Channel Impedance Value: 285 Ohm
Lead Channel Impedance Value: 342 Ohm
Lead Channel Pacing Threshold Amplitude: 0.75 V
Lead Channel Pacing Threshold Pulse Width: 0.4 ms
Lead Channel Sensing Intrinsic Amplitude: 2.5 mV
Lead Channel Sensing Intrinsic Amplitude: 2.5 mV
Lead Channel Setting Pacing Amplitude: 2.5 V
Lead Channel Setting Pacing Pulse Width: 0.4 ms
Lead Channel Setting Sensing Sensitivity: 0.3 mV
Zone Setting Status: 755011
Zone Setting Status: 755011

## 2022-08-05 ENCOUNTER — Ambulatory Visit (HOSPITAL_COMMUNITY)
Admission: RE | Admit: 2022-08-05 | Discharge: 2022-08-05 | Disposition: A | Discharge status: Home / Self Care | Payer: Medicare Other | Source: Ambulatory Visit | Attending: Cardiology | Admitting: Cardiology

## 2022-08-05 ENCOUNTER — Encounter (HOSPITAL_COMMUNITY): Payer: Self-pay | Admitting: Cardiology

## 2022-08-05 VITALS — BP 100/60 | HR 70 | Wt 209.6 lb

## 2022-08-05 DIAGNOSIS — I5022 Chronic systolic (congestive) heart failure: Secondary | ICD-10-CM | POA: Diagnosis not present

## 2022-08-05 DIAGNOSIS — I2510 Atherosclerotic heart disease of native coronary artery without angina pectoris: Secondary | ICD-10-CM | POA: Insufficient documentation

## 2022-08-05 DIAGNOSIS — I493 Ventricular premature depolarization: Secondary | ICD-10-CM | POA: Insufficient documentation

## 2022-08-05 DIAGNOSIS — I4510 Unspecified right bundle-branch block: Secondary | ICD-10-CM | POA: Insufficient documentation

## 2022-08-05 DIAGNOSIS — Z7982 Long term (current) use of aspirin: Secondary | ICD-10-CM | POA: Diagnosis not present

## 2022-08-05 DIAGNOSIS — I255 Ischemic cardiomyopathy: Secondary | ICD-10-CM | POA: Insufficient documentation

## 2022-08-05 DIAGNOSIS — J849 Interstitial pulmonary disease, unspecified: Secondary | ICD-10-CM | POA: Diagnosis not present

## 2022-08-05 DIAGNOSIS — Z7902 Long term (current) use of antithrombotics/antiplatelets: Secondary | ICD-10-CM | POA: Diagnosis not present

## 2022-08-05 DIAGNOSIS — Z79899 Other long term (current) drug therapy: Secondary | ICD-10-CM | POA: Insufficient documentation

## 2022-08-05 DIAGNOSIS — G4733 Obstructive sleep apnea (adult) (pediatric): Secondary | ICD-10-CM | POA: Diagnosis not present

## 2022-08-05 DIAGNOSIS — Z9981 Dependence on supplemental oxygen: Secondary | ICD-10-CM | POA: Diagnosis not present

## 2022-08-05 DIAGNOSIS — R0602 Shortness of breath: Secondary | ICD-10-CM | POA: Diagnosis not present

## 2022-08-05 LAB — BASIC METABOLIC PANEL
Anion gap: 12 (ref 5–15)
BUN: 16 mg/dL (ref 8–23)
CO2: 22 mmol/L (ref 22–32)
Calcium: 9.4 mg/dL (ref 8.9–10.3)
Chloride: 100 mmol/L (ref 98–111)
Creatinine, Ser: 1.01 mg/dL (ref 0.61–1.24)
GFR, Estimated: >60 mL/min (ref >60)
Glucose, Bld: 231 mg/dL — ABNORMAL HIGH (ref 70–99)
Potassium: 4 mmol/L (ref 3.5–5.1)
Sodium: 134 mmol/L — ABNORMAL LOW (ref 135–145)

## 2022-08-05 LAB — LIPID PANEL
Cholesterol: 127 mg/dL (ref 0–200)
HDL: 32 mg/dL — ABNORMAL LOW (ref >40)
LDL Cholesterol: 17 mg/dL (ref 0–99)
Total CHOL/HDL Ratio: 4 RATIO
Triglycerides: 391 mg/dL — ABNORMAL HIGH (ref <150)
VLDL: 78 mg/dL — ABNORMAL HIGH (ref 0–40)

## 2022-08-05 LAB — BRAIN NATRIURETIC PEPTIDE: B Natriuretic Peptide: 22.7 pg/mL (ref 0.0–100.0)

## 2022-08-05 MED ORDER — FUROSEMIDE 40 MG PO TABS: 40.0000 mg | ORAL_TABLET | Freq: Two times a day (BID) | ORAL | 4 refills | Status: DC

## 2022-08-05 NOTE — Patient Instructions (Signed)
There has been no changes to your medications.  Labs done today, your results will be available in MyChart, we will contact you for abnormal readings.  Your physician recommends that you schedule a follow-up appointment in: 4 months  If you have any questions or concerns before your next appointment please send us a message through mychart or call our office at [phone].    TO LEAVE A MESSAGE FOR THE NURSE SELECT OPTION 2, PLEASE LEAVE A MESSAGE INCLUDING: YOUR NAME DATE OF BIRTH CALL BACK NUMBER REASON FOR CALL**this is important as we prioritize the call backs  YOU WILL RECEIVE A CALL BACK THE SAME DAY AS LONG AS YOU CALL BEFORE 4:00 PM  At the [facility], you and your health needs are our priority. As part of our continuing mission to provide you with exceptional heart care, we have created designated Provider Care Teams. These Care Teams include your primary Cardiologist (physician) and Advanced Practice Providers (APPs- Physician Assistants and Nurse Practitioners) who all work together to provide you with the care you need, when you need it.   You may see any of the following providers on your designated Care Team at your next follow up: Dr Daniel Bensimhon Dr Dalton McLean Dr. Aditya Sabharwal Amy Clegg, NP Brittainy Simmons, PA Jessica Milford,NP Lindsay Finch, PA Alma Diaz, NP Lauren Kemp, PharmD   Please be sure to bring in all your medications bottles to every appointment.    

## 2022-08-06 MED ORDER — FENOFIBRATE 48 MG PO TABS: 48.0000 mg | ORAL_TABLET | Freq: Every day | ORAL | 3 refills | Status: DC

## 2022-08-06 NOTE — Progress Notes (Signed)
ADV[facility]ED [facility] CLINIC NOTE  PCP: [name], FNP Cardiology: Dr. Percival Spanish HF Cardiology: Dr. Aundra Dubin  66 y.o. with history of CAD, ischemic cardiomyopathy/chronic systolic CHF, OSA, and VT was referred by Dr. Percival Spanish for evaluation of CHF.  Patient has a long history of CAD (see PMH below).  Last cath in 5/20 showed a chronically occluded RCA with collaterals and stable stents in the LCx system, no intervention.  He has had an ischemic cardiomyopathy with EF 20-25% in 5/20, and actually up to 35-40% on 2/21 echo (I reviewed images today).  He has a Medtronic ICD and is on amiodarone for VT. He has significant OSA but has not been able to tolerate CPAP with a facemask.    CPX was done in 3/21, study was submaximal with moderate HF limitation but also functional limitation from pulmonary restriction and exertional hypoxemia.  V/Q scan was done and was not suggestive of chronic PE.  Zio patch in 3/21 showed 2% PVCs.   He was admitted in 5/21 with PNA on a background of interstitial lung disease (looks like UIP on high resolution CT chest from 4/21).  Seen by Dr. Vaughan Browner with pulmonary, thought probably not amiodarone lung toxicity but we stopped amiodarone and put him on mexiletine.  He was on oxygen in the hospital.  He decided not to start [place] for pulmonary fibrosis.   Echo 6/22 EF 35-40% with inferior and inferolateral hypokinesis, mild RV enlargement, mildly decreased RV systolic function.   In 5/23, he was admitted with PNA.  Echo at that time showed EF 35-40%, mild MR, mildly decreased RV systolic function.   He returns today for followup of CHF.  Weight is down 4 lbs.  He is short of breath with moderate exertion, unchanged.  He has been able to deer hunt close to his house.  No chest pain.  No lightheadedness or palpitations.  He is wearing home oxygen.    Medtronic device interrogation:  No VT, no AF, stable thoracic impedance.   ECG (personally reviewed): NSR, RBBB, LAFB  Labs  (9/20): K 4.5, creatinine 1.08 Labs (3/21): hgb 16.9, K 4.5, creatinine 1.3, AST 34, ALT 54 (mildly elevated), LDL 51, TGs 373, TSH normal.  Labs (5/21): K 3.9, creatinine 1.07 Labs (7/21): K 3.9, creatinine 0.94, LDL 46, HDL 38 Labs (3/22): K 4.1, creatinine 1.0, LDL 45, HDL 35 Labs (6/22): K 4.0, creatinine 1.0 Labs (9/22): K 4.2, creatinine 0.97 Labs (12/22): BNP 18, K 4.3, creatinine 0.84 Labs (3/23): LDL 66, TGs 294 Labs (6/23): K 3.8, creatinine 0.97 Labs (9/23): K 3.7, creatinine 1.03, BNP 37  PMH: 1. CAD:  - PCI to [place] in 2012.  - DES to LCx in 5/19.  - LHC (5/20): 50% proximal LAD, 95% proximal D1, patent LCx and OM1 stents, total occlusion of RCA with collaterals.  2. Chronic systolic CHF: Ischemic cardiomyopathy. Medtronic ICD.  - RHC (5/20): mean RA 3, PA 25/5, mean PCWP 6, CI 1.86 - Echo (5/20): EF 20-25% - Echo (2/21): EF 35-40%, mild LV dilation, RV low normal function.  - CPX (3/21): peak VO2 17.6, VE/VCO2 slope 43, RER 0.96 => submaximal, moderate HF limitation but also noted is pulmonary restriction with exertional hypoxemia.  - Echo (6/22): EF 35-40% with inferior and inferolateral hypokinesis, mild RV enlargement, mildly decreased RV systolic function. - Echo (5/23): EF 35-40%, mild MR, mildly decreased RV systolic function. 3. VT: Now on amiodarone.  4. OSA: Has not tolerate CPAP.  5. HTN 6. Type  2 diabetes 7. Hyperlipidemia: Has not tolerated statins.  8. PVCs - Zio patch (3/21) with 2% PVCs 9. Interstitial lung disease: high resolution CT chest in 4/21 concerning for usual interstitial pneumonitis  SH: Married, lives in [place], retired, quit smoking in 2007.   Family History  Problem Relation Age of Onset   Lymphoma Mother    Heart attack Father 24   Heart attack Brother 30   ROS: All systems reviewed and negative except as per HPI.   Current Outpatient Medications  Medication Sig Dispense Refill   acetaminophen (TYLENOL) 500 MG tablet Take 500 mg  by mouth every 6 (six) hours as needed (for headaches.).     albuterol (VENTOLIN HFA) 108 (90 Base) MCG/ACT inhaler Inhale 1 puff into the lungs every 6 (six) hours as needed for wheezing or shortness of breath. 8 g 0   Alirocumab (PRALUENT) 75 MG/ML SOAJ Inject 75 mg into the skin every 14 (fourteen) days. 2 mL 11   aspirin 81 MG chewable tablet Chew 1 tablet (81 mg total) by mouth daily.     BD PEN NEEDLE NANO 2ND GEN 32G X 4 MM MISC See admin instructions.     carvedilol (COREG) 12.5 MG tablet TAKE 1 AND 1/2 TABLETS BY MOUTH TWICE DAILY WITH A MEAL 90 tablet 3   CINNAMON PO Take 1,000 mg by mouth in the morning and at bedtime.     clopidogrel (PLAVIX) 75 MG tablet Take 1 tablet (75 mg total) by mouth daily. 90 tablet 3   Coenzyme Q10 (CO Q 10) 100 MG CAPS Take 100 mg by mouth in the morning.     empagliflozin (JARDIANCE) 25 MG TABS tablet Take 25 mg by mouth in the morning.     empagliflozin (JARDIANCE) 25 MG TABS tablet Take by mouth daily.     ENTRESTO 49-51 MG TAKE 1 TABLET BY MOUTH TWICE DAILY 60 tablet 11   fenofibrate (TRICOR) 48 MG tablet Take 1 tablet (48 mg total) by mouth daily. 90 tablet 3   Insulin Glargine (TOUJEO SOLOSTAR [place]) Inject 50 Units into the skin at bedtime.     mexiletine (MEXITIL) 150 MG capsule TAKE 1 CAPSULE(150 MG) BY MOUTH EVERY 12 HOURS 180 capsule 3   nitroGLYCERIN (NITROSTAT) 0.4 MG SL tablet Place 1 tablet (0.4 mg total) under the tongue every 5 (five) minutes as needed for chest pain. 30 tablet 1   NOVOLOG FLEXPEN 100 UNIT/ML FlexPen Inject 5 Units into the skin See admin instructions. Inject 5 units into the skin in the morning before breakfast and two more times a day before meals as needed for an elevated BGL     pantoprazole (PROTONIX) 40 MG tablet Take 40 mg by mouth every evening.      pramipexole (MIRAPEX) 1 MG tablet Take 0.5 mg by mouth in the morning, at noon, in the evening, and at bedtime.     spironolactone (ALDACTONE) 25 MG tablet Take 1 tablet  (25 mg total) by mouth daily. 90 tablet 3   furosemide (LASIX) 40 MG tablet Take 1 tablet (40 mg total) by mouth 2 (two) times daily. 180 tablet 4   No current facility-administered medications for this encounter.   Wt Readings from Last 3 Encounters:  08/05/22 95.1 kg (209 lb 9.6 oz)  05/15/22 96.6 kg (213 lb)  02/13/22 93.7 kg (206 lb 9.6 oz)   BP 100/60   Pulse 70   Wt 95.1 kg (209 lb 9.6 oz)   SpO2 94%  BMI 31.87 kg/m  General: NAD Neck: No JVD, no thyromegaly or thyroid nodule.  Lungs: Dry crackles at bases.  CV: Nondisplaced PMI.  Heart regular S1/S2, no S3/S4, no murmur.  No peripheral edema.  No carotid bruit.  Normal pedal pulses.  Abdomen: Soft, nontender, no hepatosplenomegaly, no distention.  Skin: Intact without lesions or rashes.  Neurologic: Alert and oriented x 3.  Psych: Normal affect. Extremities: No clubbing or cyanosis.  HEENT: Normal.   Assessment/Plan: 1. CAD: Cath in 5/20 showed chronically occluded RCA with patent LCx system stents.  No interventional target.  No chest pain.  He does not take statins due to myalgias.  - Continue ASA 81 daily and Plavix 75 mg daily.   - Continue Praluent, check lipids today.    2. Chronic systolic CHF: Ischemic cardiomyopathy.  Medtronic ICD.  Narrow QRS so not CRT candidate.  Echo in 5/20 with EF 20-25%, but echo in 2/21 looked better, with EF 35-40%.  CPX in 3/21 with moderate HF limitation.  Echo 6/22 with EF 35-40%. Echo in 5/23 with stable EF 35-40%. Currently, NYHA class II symptoms.  Based on exam and Optivol, he is not volume overloaded.   - Continue Entresto 49/51 mg bid (unable to tolerate higher dose).  - Continue Lasix 40 mg bid, BMET/BNP today. He will occasionally take an extra 40 mg when weight is up.   - Continue spironolactone 25 mg daily.  - Continue Coreg 18.75 mg bid, he does not want to try to increase due to history of lightheadedness.  - Continue empagliflozin.  3. OSA: Untreated, sounds severe based  on his wife's description.  He has not been able to tolerate CPAP in the past and is not willing to try again. I suspect this is a major reason why he is tired/sleepy during the day.  4. PVCs: History of PVCs, now on mexiletine. Most recent Zio patch in 3/21 with only 2% PVCs.  There was question of amiodarone lung toxicity during 5/21 admission and amiodarone was stopped, but pulmonary consult thought more likely UIP. He denies palpitations.  - Continue mexiletine.  5. ILD: Restrictive PFTs noted with CPX.  CT chest in 4/21 concerning for UIP.  I stopped amiodarone due to concern that this could represent amiodarone lung toxicity, but pulmonary thought less likely than UIP.  He saw pulmonary and decided against treatment with Esbriet. ILD appears progressive, now on home oxygen 2L [place].  - He continues to follow with pulmonary, does not want ILD treatment.   Followup in 4 months with APP.   Loralie Champagne 08/06/2022

## 2022-08-06 NOTE — Telephone Encounter (Signed)
  Laurey Morale, MD 08/05/2022  8:36 PM EST     Good lipids, high triglycerides.  He did not tolerate Vascepa.  Would suggest fenofibrate 48 mg daily to treat high triglycerides.  Lipids in 2 months if he agrees to try it.       Patient called.  Patient aware via wife

## 2022-08-19 ENCOUNTER — Other Ambulatory Visit: Payer: Self-pay | Admitting: *Deleted

## 2022-08-19 MED ORDER — CARVEDILOL 12.5 MG PO TABS: 12.5000 mg | ORAL_TABLET | Freq: Every day | ORAL | 3 refills | Status: DC

## 2022-08-20 NOTE — Progress Notes (Signed)
Remote ICD transmission.   

## 2022-09-22 ENCOUNTER — Telehealth: Payer: Self-pay | Admitting: Pharmacist

## 2022-09-22 ENCOUNTER — Other Ambulatory Visit (HOSPITAL_COMMUNITY): Payer: Self-pay | Admitting: Cardiology

## 2022-09-22 ENCOUNTER — Other Ambulatory Visit (HOSPITAL_COMMUNITY): Payer: Self-pay

## 2022-09-22 MED ORDER — CARVEDILOL 12.5 MG PO TABS: 18.7500 mg | ORAL_TABLET | Freq: Two times a day (BID) | ORAL | 11 refills | Status: DC

## 2022-09-22 NOTE — Telephone Encounter (Signed)
[name], RPH-CPP Pharmacist  Creation Time: 09/22/2022  3:54 PM        Praluent PA expired on 08/18/22, insurance prefers Repatha this year and doesn't cover Praluent. Repatha PA submitted, Key: [id number], approved through 09/22/23.   Called pt to make him aware, no answer on home and no VM set up. Called cell, Pt doesn't have MyChart. Will send in new rx once pt made aware of med change.           [name], [place]  Certified Medical Assistant   Creation Time: 09/22/2022  3:14 PM   Signed     Coreg script needs to be updated to reflect current dose 18.75 BID Pra[place] co pay $365- pts wife reports they cannot continue this copay every month. Would like some one to reach out the silvers[employer]s with [employer] to get a PA or something [phone])

## 2022-09-22 NOTE — Telephone Encounter (Signed)
Praluent PA expired on 08/18/22, insurance prefers Repatha this year and doesn't cover Praluent. Repatha PA submitted, Key: [id number], approved through 09/22/23.  Called pt to make him aware, no answer on home and no VM set up. Called cell, Pt doesn't have MyChart. Will send in new rx once pt made aware of med change.

## 2022-09-22 NOTE — Telephone Encounter (Signed)
Coreg script needs to be updated to reflect current dose 18.75 BID [place] co pay $365- pts wife reports they cannot continue this copay every month. Would like some one to reach out the silverscripts with [employer] to get a PA or something [phone])              Coreg script updated and returned to pharmacy Will forward message to lipid clinic/team to assist further

## 2022-09-24 MED ORDER — REPATHA SURECLICK 140 MG/ML ~~LOC~~ SOAJ: 1.0000 | SUBCUTANEOUS | 11 refills | Status: DC

## 2022-09-24 NOTE — Telephone Encounter (Signed)
Spoke with pt's wife who gives pt his meds. Has 1 Praluent dose left to give on the 15th, aware to change to Repatha after that. Will call if any cost concerns but advised that anticipated copay should be back in $45-$50 range.

## 2022-09-29 NOTE — Telephone Encounter (Signed)
Pt and wife aware to change to Repatha.

## 2022-10-21 ENCOUNTER — Ambulatory Visit (INDEPENDENT_AMBULATORY_CARE_PROVIDER_SITE_OTHER): Payer: Medicare Other

## 2022-10-21 DIAGNOSIS — I255 Ischemic cardiomyopathy: Secondary | ICD-10-CM | POA: Diagnosis not present

## 2022-10-21 LAB — CUP PACEART REMOTE DEVICE CHECK
Battery Remaining Longevity: 95 mo
Battery Voltage: 2.99 V
Brady Statistic RV Percent Paced: 0.04 %
Date Time Interrogation Session: 20240305012205
HighPow Impedance: 82 Ohm
Implantable Lead Connection Status: 753985
Implantable Lead Implant Date: 20191202
Implantable Lead Location: 753860
Implantable Pulse Generator Implant Date: 20191202
Lead Channel Impedance Value: 285 Ohm
Lead Channel Impedance Value: 399 Ohm
Lead Channel Pacing Threshold Amplitude: 0.75 V
Lead Channel Pacing Threshold Pulse Width: 0.4 ms
Lead Channel Sensing Intrinsic Amplitude: 2.625 mV
Lead Channel Sensing Intrinsic Amplitude: 2.625 mV
Lead Channel Setting Pacing Amplitude: 2.5 V
Lead Channel Setting Pacing Pulse Width: 0.4 ms
Lead Channel Setting Sensing Sensitivity: 0.3 mV
Zone Setting Status: 755011
Zone Setting Status: 755011

## 2022-11-28 NOTE — Progress Notes (Signed)
Remote ICD transmission.   

## 2022-12-05 ENCOUNTER — Ambulatory Visit (HOSPITAL_COMMUNITY)
Admission: RE | Admit: 2022-12-05 | Discharge: 2022-12-05 | Disposition: A | Discharge status: Home / Self Care | Payer: Medicare Other | Source: Ambulatory Visit | Attending: Family Medicine | Admitting: Family Medicine

## 2022-12-05 ENCOUNTER — Encounter (HOSPITAL_COMMUNITY): Payer: Self-pay

## 2022-12-05 VITALS — BP 110/64 | HR 53 | Wt 205.8 lb

## 2022-12-05 DIAGNOSIS — G4733 Obstructive sleep apnea (adult) (pediatric): Secondary | ICD-10-CM | POA: Diagnosis not present

## 2022-12-05 DIAGNOSIS — I255 Ischemic cardiomyopathy: Secondary | ICD-10-CM | POA: Diagnosis not present

## 2022-12-05 DIAGNOSIS — E785 Hyperlipidemia, unspecified: Secondary | ICD-10-CM | POA: Insufficient documentation

## 2022-12-05 DIAGNOSIS — Z87891 Personal history of nicotine dependence: Secondary | ICD-10-CM | POA: Insufficient documentation

## 2022-12-05 DIAGNOSIS — I5022 Chronic systolic (congestive) heart failure: Secondary | ICD-10-CM | POA: Diagnosis not present

## 2022-12-05 DIAGNOSIS — J849 Interstitial pulmonary disease, unspecified: Secondary | ICD-10-CM

## 2022-12-05 DIAGNOSIS — Z9981 Dependence on supplemental oxygen: Secondary | ICD-10-CM | POA: Diagnosis not present

## 2022-12-05 DIAGNOSIS — I110 Hypertensive heart disease with heart failure: Secondary | ICD-10-CM | POA: Insufficient documentation

## 2022-12-05 DIAGNOSIS — Z7902 Long term (current) use of antithrombotics/antiplatelets: Secondary | ICD-10-CM | POA: Diagnosis not present

## 2022-12-05 DIAGNOSIS — I2510 Atherosclerotic heart disease of native coronary artery without angina pectoris: Secondary | ICD-10-CM | POA: Insufficient documentation

## 2022-12-05 DIAGNOSIS — E119 Type 2 diabetes mellitus without complications: Secondary | ICD-10-CM | POA: Insufficient documentation

## 2022-12-05 DIAGNOSIS — M791 Myalgia, unspecified site: Secondary | ICD-10-CM | POA: Insufficient documentation

## 2022-12-05 DIAGNOSIS — Z79899 Other long term (current) drug therapy: Secondary | ICD-10-CM | POA: Diagnosis not present

## 2022-12-05 DIAGNOSIS — Z8679 Personal history of other diseases of the circulatory system: Secondary | ICD-10-CM | POA: Diagnosis not present

## 2022-12-05 DIAGNOSIS — Z7982 Long term (current) use of aspirin: Secondary | ICD-10-CM | POA: Insufficient documentation

## 2022-12-05 DIAGNOSIS — I493 Ventricular premature depolarization: Secondary | ICD-10-CM | POA: Insufficient documentation

## 2022-12-05 DIAGNOSIS — Z955 Presence of coronary angioplasty implant and graft: Secondary | ICD-10-CM | POA: Diagnosis not present

## 2022-12-05 DIAGNOSIS — R5383 Other fatigue: Secondary | ICD-10-CM

## 2022-12-05 LAB — BRAIN NATRIURETIC PEPTIDE: B Natriuretic Peptide: 46.1 pg/mL (ref 0.0–100.0)

## 2022-12-05 LAB — BASIC METABOLIC PANEL
Anion gap: 12 (ref 5–15)
BUN: 20 mg/dL (ref 8–23)
CO2: 22 mmol/L (ref 22–32)
Calcium: 9.4 mg/dL (ref 8.9–10.3)
Chloride: 100 mmol/L (ref 98–111)
Creatinine, Ser: 1.17 mg/dL (ref 0.61–1.24)
GFR, Estimated: >60 mL/min (ref >60)
Glucose, Bld: 271 mg/dL — ABNORMAL HIGH (ref 70–99)
Potassium: 4 mmol/L (ref 3.5–5.1)
Sodium: 134 mmol/L — ABNORMAL LOW (ref 135–145)

## 2022-12-05 LAB — IRON AND TIBC
Iron: 102 ug/dL (ref 45–182)
Saturation Ratios: 26 % (ref 17.9–39.5)
TIBC: 396 ug/dL (ref 250–450)
UIBC: 294 ug/dL

## 2022-12-05 LAB — CBC
HCT: 49 % (ref 39.0–52.0)
Hemoglobin: 17.4 g/dL — ABNORMAL HIGH (ref 13.0–17.0)
MCH: 31.7 pg (ref 26.0–34.0)
MCHC: 35.5 g/dL (ref 30.0–36.0)
MCV: 89.3 fL (ref 80.0–100.0)
Platelets: 159 10*3/uL (ref 150–400)
RBC: 5.49 MIL/uL (ref 4.22–5.81)
RDW: 13.3 % (ref 11.5–15.5)
WBC: 9.9 10*3/uL (ref 4.0–10.5)
nRBC: 0 % (ref 0.0–0.2)

## 2022-12-05 LAB — FERRITIN: Ferritin: 234 ng/mL (ref 24–336)

## 2022-12-05 MED ORDER — CARVEDILOL 12.5 MG PO TABS: 12.5000 mg | ORAL_TABLET | Freq: Two times a day (BID) | ORAL | 8 refills | Status: DC

## 2022-12-05 NOTE — Progress Notes (Signed)
[facility]  PCP: Jerrye Bushy, FNP Cardiology: Dr. Antoine Poche HF Cardiology: Dr. Shirlee Latch  67 y.o. with history of CAD, ischemic cardiomyopathy/chronic systolic CHF, OSA, and VT was referred by Dr. Antoine Poche for evaluation of CHF.  Patient has a long history of CAD (see PMH below).  Last cath in 5/20 showed a chronically occluded RCA with collaterals and stable stents in the LCx system, no intervention.  He has had an ischemic cardiomyopathy with EF 20-25% in 5/20, and actually up to 35-40% on 2/21 echo. He has a Medtronic ICD and is on amiodarone for VT. He has significant OSA but has not been able to tolerate CPAP with a facemask.    CPX was done in 3/21, study was submaximal with moderate HF limitation but also functional limitation from pulmonary restriction and exertional hypoxemia.  V/Q scan was done and was not suggestive of chronic PE.  Zio patch in 3/21 showed 2% PVCs.   He was admitted in 5/21 with PNA on a background of interstitial lung disease (looks like UIP on high resolution CT chest from 4/21).  Seen by Dr. Isaiah Serge with pulmonary, thought probably not amiodarone lung toxicity but we stopped amiodarone and put him on mexiletine.  He was on oxygen in the hospital.  He decided not to start Esbriet for pulmonary fibrosis.   Echo 6/22 EF 35-40% with inferior and inferolateral hypokinesis, mild RV enlargement, mildly decreased RV systolic function.   In 5/23, he was admitted with PNA.  Echo at that time showed EF 35-40%, mild MR, mildly decreased RV systolic function.   Today he returns for HF follow up with his wife. Overall feeling fatigued. He has SOB walking further distances on flat ground. Wife says he pushes himself too hard and won't pace himself. Fatigue is major limiter. Denies palpitations, CP, dizziness, edema, or PND/Orthopnea. Appetite ok. No fever or chills. Weight at home stable. Taking all medications. He wears 3L oxygen at home. Home BPs 100/60's and he  is symptomatic with these readings, wife occasionally decreases Coreg to 12.5 when BP soft.   Medtronic device interrogation:  No VT, no AF, stable thoracic impedance, 1/8 hr/day activity.   ECG (personally reviewed): SB 54 bpm, RBBB   Labs (9/20): K 4.5, creatinine 1.08 Labs (3/21): hgb 16.9, K 4.5, creatinine 1.3, AST 34, ALT 54 (mildly elevated), LDL 51, TGs 373, TSH normal.  Labs (5/21): K 3.9, creatinine 1.07 Labs (7/21): K 3.9, creatinine 0.94, LDL 46, HDL 38 Labs (3/22): K 4.1, creatinine 1.0, LDL 45, HDL 35 Labs (6/22): K 4.0, creatinine 1.0 Labs (9/22): K 4.2, creatinine 0.97 Labs (12/22): BNP 18, K 4.3, creatinine 0.84 Labs (3/23): LDL 66, TGs 294 Labs (6/23): K 3.8, creatinine 0.97 Labs (9/23): K 3.7, creatinine 1.03, BNP 37 Labs (12/23): K 4.0, creatinine 1.01, LDL 17  PMH: 1. CAD:  - PCI to OM1 in 2012.  - DES to LCx in 5/19.  - LHC (5/20): 50% proximal LAD, 95% proximal D1, patent LCx and OM1 stents, total occlusion of RCA with collaterals.  2. Chronic systolic CHF: Ischemic cardiomyopathy. Medtronic ICD.  - RHC (5/20): mean RA 3, PA 25/5, mean PCWP 6, CI 1.86 - Echo (5/20): EF 20-25% - Echo (2/21): EF 35-40%, mild LV dilation, RV low normal function.  - CPX (3/21): peak VO2 17.6, VE/VCO2 slope 43, RER 0.96 => submaximal, moderate HF limitation but also noted is pulmonary restriction with exertional hypoxemia.  - Echo (6/22): EF 35-40% with inferior and  inferolateral hypokinesis, mild RV enlargement, mildly decreased RV systolic function. - Echo (5/23): EF 35-40%, mild MR, mildly decreased RV systolic function. 3. VT: Now on amiodarone.  4. OSA: Has not tolerate CPAP.  5. HTN 6. Type 2 diabetes 7. Hyperlipidemia: Has not tolerated statins.  8. PVCs - Zio patch (3/21) with 2% PVCs 9. Interstitial lung disease: high resolution CT chest in 4/21 concerning for usual interstitial pneumonitis.  SH: Married, lives in [place], retired, quit smoking in 2007.   Family  History  Problem Relation Age of Onset   Lymphoma Mother    Heart attack Father 65   Heart attack Brother 83   ROS: All systems reviewed and negative except as per HPI.   Current Outpatient Medications  Medication Sig Dispense Refill   acetaminophen (TYLENOL) 500 MG tablet Take 500 mg by mouth every 6 (six) hours as needed (for headaches.).     albuterol (VENTOLIN HFA) 108 (90 Base) MCG/ACT inhaler Inhale 1 puff into the lungs every 6 (six) hours as needed for wheezing or shortness of breath. 8 g 0   aspirin 81 MG chewable tablet Chew 1 tablet (81 mg total) by mouth daily.     BD PEN NEEDLE NANO 2ND GEN 32G X 4 MM MISC See admin instructions.     carvedilol (COREG) 12.5 MG tablet Take 1.5 tablets (18.75 mg total) by mouth 2 (two) times daily with a meal. 90 tablet 11   CINNAMON PO Take 1,000 mg by mouth in the morning and at bedtime.     clopidogrel (PLAVIX) 75 MG tablet Take 1 tablet (75 mg total) by mouth daily. 90 tablet 3   Coenzyme Q10 (CO Q 10) 100 MG CAPS Take 100 mg by mouth in the morning.     empagliflozin (JARDIANCE) 25 MG TABS tablet Take 25 mg by mouth in the morning.     ENTRESTO 49-51 MG TAKE 1 TABLET BY MOUTH TWICE DAILY 60 tablet 11   Evolocumab (REPATHA SURECLICK) 140 MG/ML SOAJ Inject 140 mg into the skin every 14 (fourteen) days. 2 mL 11   furosemide (LASIX) 40 MG tablet Take 1 tablet (40 mg total) by mouth 2 (two) times daily. 180 tablet 4   Insulin Glargine (TOUJEO SOLOSTAR [place]) Inject 50 Units into the skin at bedtime.     mexiletine (MEXITIL) 150 MG capsule TAKE 1 CAPSULE(150 MG) BY MOUTH EVERY 12 HOURS 180 capsule 3   nitroGLYCERIN (NITROSTAT) 0.4 MG SL tablet Place 1 tablet (0.4 mg total) under the tongue every 5 (five) minutes as needed for chest pain. 30 tablet 1   NOVOLOG FLEXPEN 100 UNIT/ML FlexPen Inject 5 Units into the skin See admin instructions. Inject 5 units into the skin in the morning before breakfast and two more times a day before meals as needed for  an elevated BGL     OXYGEN Inhale into the lungs daily. 3-liters daily.     pantoprazole (PROTONIX) 40 MG tablet Take 40 mg by mouth every evening.      pramipexole (MIRAPEX) 1 MG tablet Take 0.5 mg by mouth in the morning, at noon, in the evening, and at bedtime.     spironolactone (ALDACTONE) 25 MG tablet Take 1 tablet (25 mg total) by mouth daily. 90 tablet 3   No current facility-administered medications for this encounter.   Wt Readings from Last 3 Encounters:  12/05/22 93.4 kg (205 lb 12.8 oz)  08/05/22 95.1 kg (209 lb 9.6 oz)  05/15/22 96.6 kg (213 lb)  BP 110/64   Pulse (!) 53   Wt 93.4 kg (205 lb 12.8 oz)   SpO2 92%   BMI 31.29 kg/m  Physical Exam General:  NAD. No resp difficulty, walked into clinic, fatigued appearing HEENT: Normal Neck: Supple. No JVD, thick neck. Carotids 2+ bilat; no bruits. No lymphadenopathy or thryomegaly appreciated. Cor: PMI nondisplaced. Brady rate & rhythm. No rubs, gallops or murmurs. Lungs: Clear, coarse BS RLL Abdomen: Soft, nontender, nondistended. No hepatosplenomegaly. No bruits or masses. Good bowel sounds. Extremities: No cyanosis, clubbing, rash, edema Neuro: Alert & oriented x 3, cranial nerves grossly intact. Moves all 4 extremities w/o difficulty. Affect pleasant.  Assessment/Plan: 1. CAD: Cath in 5/20 showed chronically occluded RCA with patent LCx system stents.  No interventional target.  No chest pain.  He does not take statins due to myalgias.  - Continue ASA 81 daily and Plavix 75 mg daily.   - Continue Praluent, check lipids today.    2. Chronic systolic CHF: Ischemic cardiomyopathy.  Medtronic ICD.  Narrow QRS so not CRT candidate.  Echo in 5/20 with EF 20-25%, but echo in 2/21 looked better, with EF 35-40%.  CPX in 3/21 with moderate HF limitation.  Echo 6/22 with EF 35-40%. Echo in 5/23 with stable EF 35-40%. Worse NYHA class III-IIIb symptoms.  Based on exam and Optivol, he is not volume overloaded.   - With soft BP  readings & worsening fatigue, decrease Coreg to 12.5 mg bid. - No HR room to add digoxin, consider next visit with worsening NYHA III symptoms. - Continue Entresto 49/51 mg bid (unable to tolerate higher dose).  - Continue Lasix 40 mg bid, BMET/BNP today. He will occasionally take an extra 40 mg when weight is up.   - Continue spironolactone 25 mg daily.  - Continue empagliflozin.  - We discussed repeating CPX to quantify HF limitation but he does not feel he could complete test. - Update echo. EF currently too high for [place]. 3. OSA: Untreated, sounds severe based on his wife's description.  He has not been able to tolerate CPAP in the past and is not willing to try again. I suspect this is a major reason why he is tired/sleepy during the day.  4. PVCs: History of PVCs, now on mexiletine. Most recent Zio patch in 3/21 with only 2% PVCs.  There was question of amiodarone lung toxicity during 5/21 admission and amiodarone was stopped, but pulmonary consult thought more likely UIP. He denies palpitations.  - Continue mexiletine.  5. ILD: Restrictive PFTs noted with CPX.  CT chest in 4/21 concerning for UIP.  Dr. Shirlee Latch stopped amiodarone due to concern that this could represent amiodarone lung toxicity, but pulmonary thought less likely than UIP.  He saw pulmonary and decided against treatment with Esbriet. ILD appears progressive, now on home oxygen 3L [place].  - He continues to follow with pulmonary, does not want ILD treatment.  6. Fatigue: Likely multifactorial, likely OSA and ILD contributing.  - Continue oxygen at all times. - Check iron studies.  Followup in 3-4 months with Dr. Shirlee Latch.   Anderson [place] [facility] FNP-BC 12/05/2022

## 2022-12-05 NOTE — Patient Instructions (Addendum)
Thank you for coming in today  Labs were done today, if any labs are abnormal the clinic will call you No news is good news  Medications: Decrease Coreg to 12.5 mg 1 tablet twice daily   Follow up appointments:  Your physician recommends that you schedule a follow-up appointment in:  4 months with Dr. Earlean Shawl will receive a reminder letter in the mail a few months in advance. If you don't receive a letter, please call our office to schedule the follow-up appointment.  Your physician has requested that you have an echocardiogram. Echocardiography is a painless test that uses sound waves to create images of your heart. It provides your doctor with information about the size and shape of your heart and how well your heart's chambers and valves are working. This procedure takes approximately one hour. There are no restrictions for this procedure.       Do the following things EVERYDAY: Weigh yourself in the morning before breakfast. Write it down and keep it in a log. Take your medicines as prescribed Eat low salt foods--Limit salt (sodium) to 2000 mg per day.  Stay as active as you can everyday Limit all fluids for the day to less than 2 liters   At the [facility], you and your health needs are our priority. As part of our continuing mission to provide you with exceptional heart care, we have created designated Provider Care Teams. These Care Teams include your primary Cardiologist (physician) and Advanced Practice Providers (APPs- Physician Assistants and Nurse Practitioners) who all work together to provide you with the care you need, when you need it.   You may see any of the following providers on your designated Care Team at your next follow up: Dr Arvilla Meres Dr Marca Ancona Dr. Marcos Eke, NP Robbie Lis, [place] [facility] [place], [place] Brynda Peon, NP Karle Plumber, PharmD   Please be sure to bring in all your  medications bottles to every appointment.    Thank you for choosing  [facility]  If you have any questions or concerns before your next appointment please send [place] a message through [place] or call our office at [phone].    TO LEAVE A MESSAGE FOR THE NURSE SELECT OPTION 2, PLEASE LEAVE A MESSAGE INCLUDING: YOUR NAME DATE OF BIRTH CALL BACK NUMBER REASON FOR CALL**this is important as we prioritize the call backs  YOU WILL RECEIVE A CALL BACK THE SAME DAY AS LONG AS YOU CALL BEFORE 4:00 PM

## 2022-12-15 ENCOUNTER — Telehealth (HOSPITAL_COMMUNITY): Payer: Self-pay

## 2022-12-15 NOTE — Telephone Encounter (Signed)
Oxygen orders faxed to [facility] on 12/15/22

## 2022-12-29 ENCOUNTER — Ambulatory Visit (HOSPITAL_COMMUNITY)
Admission: RE | Admit: 2022-12-29 | Discharge: 2022-12-29 | Disposition: A | Discharge status: Home / Self Care | Payer: Medicare Other | Source: Ambulatory Visit | Attending: Family | Admitting: Family

## 2022-12-29 DIAGNOSIS — G4730 Sleep apnea, unspecified: Secondary | ICD-10-CM | POA: Insufficient documentation

## 2022-12-29 DIAGNOSIS — E119 Type 2 diabetes mellitus without complications: Secondary | ICD-10-CM | POA: Diagnosis not present

## 2022-12-29 DIAGNOSIS — I2510 Atherosclerotic heart disease of native coronary artery without angina pectoris: Secondary | ICD-10-CM | POA: Insufficient documentation

## 2022-12-29 DIAGNOSIS — I110 Hypertensive heart disease with heart failure: Secondary | ICD-10-CM | POA: Insufficient documentation

## 2022-12-29 DIAGNOSIS — I5022 Chronic systolic (congestive) heart failure: Secondary | ICD-10-CM | POA: Diagnosis not present

## 2022-12-29 DIAGNOSIS — I358 Other nonrheumatic aortic valve disorders: Secondary | ICD-10-CM | POA: Insufficient documentation

## 2022-12-29 DIAGNOSIS — I509 Heart failure, unspecified: Secondary | ICD-10-CM | POA: Insufficient documentation

## 2022-12-29 DIAGNOSIS — E785 Hyperlipidemia, unspecified: Secondary | ICD-10-CM | POA: Insufficient documentation

## 2022-12-29 LAB — ECHOCARDIOGRAM COMPLETE
Area-P 1/2: 3.99 cm2
Calc EF: 45 %
S' Lateral: 4 cm
Single Plane A2C EF: 44.1 %
Single Plane A4C EF: 45.6 %

## 2022-12-29 NOTE — Progress Notes (Signed)
  Echocardiogram 2D Echocardiogram has been performed.  [name] 12/29/2022, 10:52 AM

## 2023-01-07 ENCOUNTER — Other Ambulatory Visit: Payer: Self-pay | Admitting: Cardiology

## 2023-01-08 NOTE — Telephone Encounter (Signed)
This is a CHF pt 

## 2023-01-20 ENCOUNTER — Ambulatory Visit (INDEPENDENT_AMBULATORY_CARE_PROVIDER_SITE_OTHER): Payer: Medicare Other

## 2023-01-20 DIAGNOSIS — I5022 Chronic systolic (congestive) heart failure: Secondary | ICD-10-CM | POA: Diagnosis not present

## 2023-01-20 DIAGNOSIS — I255 Ischemic cardiomyopathy: Secondary | ICD-10-CM

## 2023-01-20 LAB — CUP PACEART REMOTE DEVICE CHECK
Battery Remaining Longevity: 93 mo
Battery Voltage: 2.98 V
Brady Statistic RV Percent Paced: 0.05 %
Date Time Interrogation Session: 20240604012504
HighPow Impedance: 81 Ohm
Implantable Lead Connection Status: 753985
Implantable Lead Implant Date: 20191202
Implantable Lead Location: 753860
Implantable Pulse Generator Implant Date: 20191202
Lead Channel Impedance Value: 285 Ohm
Lead Channel Impedance Value: 380 Ohm
Lead Channel Pacing Threshold Amplitude: 0.75 V
Lead Channel Pacing Threshold Pulse Width: 0.4 ms
Lead Channel Sensing Intrinsic Amplitude: 2.375 mV
Lead Channel Sensing Intrinsic Amplitude: 2.375 mV
Lead Channel Setting Pacing Amplitude: 2.5 V
Lead Channel Setting Pacing Pulse Width: 0.4 ms
Lead Channel Setting Sensing Sensitivity: 0.3 mV
Zone Setting Status: 755011
Zone Setting Status: 755011

## 2023-02-12 NOTE — Progress Notes (Signed)
Remote ICD transmission.   

## 2023-02-16 ENCOUNTER — Encounter: Payer: Medicare Other | Admitting: Cardiology

## 2023-04-13 ENCOUNTER — Encounter: Payer: Medicare Other | Admitting: Cardiology

## 2023-04-21 ENCOUNTER — Ambulatory Visit: Payer: BLUE CROSS/BLUE SHIELD | Attending: Cardiology

## 2023-04-21 DIAGNOSIS — I5022 Chronic systolic (congestive) heart failure: Secondary | ICD-10-CM

## 2023-04-21 DIAGNOSIS — I255 Ischemic cardiomyopathy: Secondary | ICD-10-CM | POA: Diagnosis not present

## 2023-04-22 LAB — CUP PACEART REMOTE DEVICE CHECK
Battery Remaining Longevity: 90 mo
Battery Voltage: 2.97 V
Brady Statistic RV Percent Paced: 0.18 %
Date Time Interrogation Session: 20240903033425
HighPow Impedance: 85 Ohm
Implantable Lead Connection Status: 753985
Implantable Lead Implant Date: 20191202
Implantable Lead Location: 753860
Implantable Pulse Generator Implant Date: 20191202
Lead Channel Impedance Value: 285 Ohm
Lead Channel Impedance Value: 342 Ohm
Lead Channel Pacing Threshold Amplitude: 0.75 V
Lead Channel Pacing Threshold Pulse Width: 0.4 ms
Lead Channel Sensing Intrinsic Amplitude: 2.625 mV
Lead Channel Sensing Intrinsic Amplitude: 2.625 mV
Lead Channel Setting Pacing Amplitude: 2.5 V
Lead Channel Setting Pacing Pulse Width: 0.4 ms
Lead Channel Setting Sensing Sensitivity: 0.3 mV
Zone Setting Status: 755011
Zone Setting Status: 755011

## 2023-04-28 ENCOUNTER — Other Ambulatory Visit (HOSPITAL_COMMUNITY): Payer: Self-pay

## 2023-04-28 DIAGNOSIS — I509 Heart failure, unspecified: Secondary | ICD-10-CM

## 2023-04-28 MED ORDER — SPIRONOLACTONE 25 MG PO TABS: 25.0000 mg | ORAL_TABLET | Freq: Every day | ORAL | 3 refills | Status: DC

## 2023-05-04 ENCOUNTER — Other Ambulatory Visit (HOSPITAL_COMMUNITY): Payer: Self-pay

## 2023-05-04 MED ORDER — ENTRESTO 49-51 MG PO TABS: 1.0000 | ORAL_TABLET | Freq: Two times a day (BID) | ORAL | 11 refills | Status: DC

## 2023-05-04 NOTE — Progress Notes (Signed)
Remote ICD transmission.   

## 2023-07-06 ENCOUNTER — Other Ambulatory Visit (HOSPITAL_COMMUNITY): Payer: Self-pay

## 2023-07-06 DIAGNOSIS — I5022 Chronic systolic (congestive) heart failure: Secondary | ICD-10-CM

## 2023-07-06 MED ORDER — MEXILETINE HCL 150 MG PO CAPS: ORAL_CAPSULE | ORAL | 3 refills | Status: DC

## 2023-07-10 ENCOUNTER — Ambulatory Visit (HOSPITAL_COMMUNITY)
Admission: RE | Admit: 2023-07-10 | Discharge: 2023-07-10 | Disposition: A | Discharge status: Home / Self Care | Payer: Medicare Other | Source: Ambulatory Visit | Attending: Cardiology | Admitting: Cardiology

## 2023-07-10 ENCOUNTER — Encounter (HOSPITAL_COMMUNITY): Payer: Self-pay | Admitting: Cardiology

## 2023-07-10 VITALS — BP 116/68 | HR 66 | Wt 208.8 lb

## 2023-07-10 DIAGNOSIS — I4510 Unspecified right bundle-branch block: Secondary | ICD-10-CM | POA: Insufficient documentation

## 2023-07-10 DIAGNOSIS — I472 Ventricular tachycardia, unspecified: Secondary | ICD-10-CM | POA: Diagnosis not present

## 2023-07-10 DIAGNOSIS — Z95810 Presence of automatic (implantable) cardiac defibrillator: Secondary | ICD-10-CM | POA: Insufficient documentation

## 2023-07-10 DIAGNOSIS — I493 Ventricular premature depolarization: Secondary | ICD-10-CM | POA: Diagnosis not present

## 2023-07-10 DIAGNOSIS — Z79899 Other long term (current) drug therapy: Secondary | ICD-10-CM | POA: Insufficient documentation

## 2023-07-10 DIAGNOSIS — I509 Heart failure, unspecified: Secondary | ICD-10-CM | POA: Diagnosis not present

## 2023-07-10 DIAGNOSIS — R9431 Abnormal electrocardiogram [ECG] [EKG]: Secondary | ICD-10-CM | POA: Diagnosis not present

## 2023-07-10 DIAGNOSIS — G4733 Obstructive sleep apnea (adult) (pediatric): Secondary | ICD-10-CM | POA: Diagnosis not present

## 2023-07-10 DIAGNOSIS — R5383 Other fatigue: Secondary | ICD-10-CM | POA: Insufficient documentation

## 2023-07-10 DIAGNOSIS — I5022 Chronic systolic (congestive) heart failure: Secondary | ICD-10-CM | POA: Diagnosis present

## 2023-07-10 DIAGNOSIS — Z9981 Dependence on supplemental oxygen: Secondary | ICD-10-CM | POA: Insufficient documentation

## 2023-07-10 DIAGNOSIS — J849 Interstitial pulmonary disease, unspecified: Secondary | ICD-10-CM | POA: Diagnosis not present

## 2023-07-10 DIAGNOSIS — Z955 Presence of coronary angioplasty implant and graft: Secondary | ICD-10-CM | POA: Insufficient documentation

## 2023-07-10 DIAGNOSIS — I2510 Atherosclerotic heart disease of native coronary artery without angina pectoris: Secondary | ICD-10-CM | POA: Diagnosis not present

## 2023-07-10 DIAGNOSIS — I255 Ischemic cardiomyopathy: Secondary | ICD-10-CM | POA: Insufficient documentation

## 2023-07-10 LAB — BASIC METABOLIC PANEL
Anion gap: 12 (ref 5–15)
BUN: 13 mg/dL (ref 8–23)
CO2: 25 mmol/L (ref 22–32)
Calcium: 9.4 mg/dL (ref 8.9–10.3)
Chloride: 102 mmol/L (ref 98–111)
Creatinine, Ser: 1.14 mg/dL (ref 0.61–1.24)
GFR, Estimated: >60 mL/min (ref >60)
Glucose, Bld: 182 mg/dL — ABNORMAL HIGH (ref 70–99)
Potassium: 3.7 mmol/L (ref 3.5–5.1)
Sodium: 139 mmol/L (ref 135–145)

## 2023-07-10 LAB — LIPID PANEL
Cholesterol: 124 mg/dL (ref 0–200)
HDL: 34 mg/dL — ABNORMAL LOW (ref >40)
LDL Cholesterol: 22 mg/dL (ref 0–99)
Total CHOL/HDL Ratio: 3.6 {ratio}
Triglycerides: 339 mg/dL — ABNORMAL HIGH (ref <150)
VLDL: 68 mg/dL — ABNORMAL HIGH (ref 0–40)

## 2023-07-10 LAB — BRAIN NATRIURETIC PEPTIDE: B Natriuretic Peptide: 39.4 pg/mL (ref 0.0–100.0)

## 2023-07-10 MED ORDER — FUROSEMIDE 40 MG PO TABS: 40.0000 mg | ORAL_TABLET | Freq: Two times a day (BID) | ORAL | 4 refills | Status: DC

## 2023-07-10 MED ORDER — ENTRESTO 49-51 MG PO TABS: 1.0000 | ORAL_TABLET | Freq: Two times a day (BID) | ORAL | 11 refills | Status: DC

## 2023-07-10 MED ORDER — FUROSEMIDE 40 MG PO TABS: ORAL_TABLET | ORAL | 4 refills | Status: DC

## 2023-07-10 MED ORDER — SPIRONOLACTONE 25 MG PO TABS: 25.0000 mg | ORAL_TABLET | Freq: Every evening | ORAL | Status: DC

## 2023-07-10 NOTE — Patient Instructions (Addendum)
CHANGE Spironolactone to nightly.  CHANGE Lasix to 60 mg in the morning and 40 mg in the evening   Labs done today, your results will be available in MyChart, we will contact you for abnormal readings.  Your provider has ordered a high resolution CT of the chest. You will be called to have this test arranged.  Your physician recommends that you schedule a follow-up appointment in: 3 months.  If you have any questions or concerns before your next appointment please send [place] a message through [place] or call our office at [phone].    TO LEAVE A MESSAGE FOR THE NURSE SELECT OPTION 2, PLEASE LEAVE A MESSAGE INCLUDING: YOUR NAME DATE OF BIRTH CALL BACK NUMBER REASON FOR CALL**this is important as we prioritize the call backs  YOU WILL RECEIVE A CALL BACK THE SAME DAY AS LONG AS YOU CALL BEFORE 4:00 PM  At the [facility], you and your health needs are our priority. As part of our continuing mission to provide you with exceptional heart care, we have created designated Provider Care Teams. These Care Teams include your primary Cardiologist (physician) and Advanced Practice Providers (APPs- Physician Assistants and Nurse Practitioners) who all work together to provide you with the care you need, when you need it.   You may see any of the following providers on your designated Care Team at your next follow up: Dr Arvilla Meres Dr Marca Ancona Dr. Dorthula Nettles Dr. Clearnce Hasten Amy Filbert Schilder, NP Robbie Lis, [place] [facility] [place], [place] Brynda Peon, NP [place] Lee, NP Karle Plumber, PharmD   Please be sure to bring in all your medications bottles to every appointment.    Thank you for choosing [place] [facility]

## 2023-07-10 NOTE — Progress Notes (Signed)
ReDS Vest / Clip - 07/10/23 1200       ReDS Vest / Clip   Station Marker D    Ruler Value 35    ReDS Value Range Low volume    ReDS Actual Value 47

## 2023-07-12 NOTE — Progress Notes (Signed)
[facility]  PCP: Jerrye Bushy, FNP Cardiology: Dr. Antoine Poche HF Cardiology: Dr. Shirlee Latch  67 y.o. with history of CAD, ischemic cardiomyopathy/chronic systolic CHF, OSA, and VT was referred by Dr. Antoine Poche for evaluation of CHF.  Patient has a long history of CAD (see PMH below).  Last cath in 5/20 showed a chronically occluded RCA with collaterals and stable stents in the LCx system, no intervention.  He has had an ischemic cardiomyopathy with EF 20-25% in 5/20, and actually up to 35-40% on 2/21 echo. He has a Medtronic ICD and is on amiodarone for VT. He has significant OSA but has not been able to tolerate CPAP with a facemask.    CPX was done in 3/21, study was submaximal with moderate HF limitation but also functional limitation from pulmonary restriction and exertional hypoxemia.  V/Q scan was done and was not suggestive of chronic PE.  Zio patch in 3/21 showed 2% PVCs.   He was admitted in 5/21 with PNA on a background of interstitial lung disease (looks like UIP on high resolution CT chest from 4/21).  Seen by Dr. Isaiah Serge with pulmonary, thought probably not amiodarone lung toxicity but we stopped amiodarone and put him on mexiletine.  He was on oxygen in the hospital.  He decided not to start Esbriet for pulmonary fibrosis.   Echo 6/22 EF 35-40% with inferior and inferolateral hypokinesis, mild RV enlargement, mildly decreased RV systolic function.   In 5/23, he was admitted with PNA.  Echo at that time showed EF 35-40%, mild MR, mildly decreased RV systolic function.   Echo in 5/24 showed EF 40-45%, normal RV.   Today he returns for HF follow up with his wife. He is now using 3L home oxygen at all times.  He is short of breath "all the time."  He is dyspneic with ADLs such as dressing and showering.  He was able to walk into the office today with oxygen.  No chest pain.  He reports numbness and tingling of his fingers bilaterally.  No orthopnea/PND.  Weight up 3 lbs.  BP runs low at times.   Medtronic device interrogation:  No VT, no AF, stable thoracic impedance.   ECG (personally reviewed): NSR, RBBB, inferior Qs.   REDS clip 47%  Labs (9/20): K 4.5, creatinine 1.08 Labs (3/21): hgb 16.9, K 4.5, creatinine 1.3, AST 34, ALT 54 (mildly elevated), LDL 51, TGs 373, TSH normal.  Labs (5/21): K 3.9, creatinine 1.07 Labs (7/21): K 3.9, creatinine 0.94, LDL 46, HDL 38 Labs (3/22): K 4.1, creatinine 1.0, LDL 45, HDL 35 Labs (6/22): K 4.0, creatinine 1.0 Labs (9/22): K 4.2, creatinine 0.97 Labs (12/22): BNP 18, K 4.3, creatinine 0.84 Labs (3/23): LDL 66, TGs 294 Labs (6/23): K 3.8, creatinine 0.97 Labs (9/23): K 3.7, creatinine 1.03, BNP 37 Labs (12/23): K 4.0, creatinine 1.01, LDL 17 Labs (4/24): K 4, creatinine 1.17, BNP 46  PMH: 1. CAD:  - PCI to OM1 in 2012.  - DES to LCx in 5/19.  - LHC (5/20): 50% proximal LAD, 95% proximal D1, patent LCx and OM1 stents, total occlusion of RCA with collaterals.  2. Chronic systolic CHF: Ischemic cardiomyopathy. Medtronic ICD.  - RHC (5/20): mean RA 3, PA 25/5, mean PCWP 6, CI 1.86 - Echo (5/20): EF 20-25% - Echo (2/21): EF 35-40%, mild LV dilation, RV low normal function.  - CPX (3/21): peak VO2 17.6, VE/VCO2 slope 43, RER 0.96 => submaximal, moderate HF limitation but also  noted is pulmonary restriction with exertional hypoxemia.  - Echo (6/22): EF 35-40% with inferior and inferolateral hypokinesis, mild RV enlargement, mildly decreased RV systolic function. - Echo (5/23): EF 35-40%, mild MR, mildly decreased RV systolic function. - Echo (5/24): EF 40-45%, normal RV. 3. VT: Amiodarone was stopped with lung disease.  4. OSA: Has not tolerate CPAP.  5. HTN 6. Type 2 diabetes 7. Hyperlipidemia: Has not tolerated statins.  8. PVCs - Zio patch (3/21) with 2% PVCs 9. Interstitial lung disease: high resolution CT chest in 4/21 concerning for usual interstitial pneumonitis.  SH: Married, lives in [place],  retired, quit smoking in 2007.   Family History  Problem Relation Age of Onset   Lymphoma Mother    Heart attack Father 75   Heart attack Brother 24   ROS: All systems reviewed and negative except as per HPI.   Current Outpatient Medications  Medication Sig Dispense Refill   acetaminophen (TYLENOL) 500 MG tablet Take 500 mg by mouth every 6 (six) hours as needed (for headaches.).     albuterol (VENTOLIN HFA) 108 (90 Base) MCG/ACT inhaler Inhale 1 puff into the lungs every 6 (six) hours as needed for wheezing or shortness of breath. 8 g 0   aspirin 81 MG chewable tablet Chew 1 tablet (81 mg total) by mouth daily.     BD PEN NEEDLE NANO 2ND GEN 32G X 4 MM MISC See admin instructions.     carvedilol (COREG) 12.5 MG tablet Take 1 tablet (12.5 mg total) by mouth 2 (two) times daily with a meal. 60 tablet 8   CINNAMON PO Take 1,000 mg by mouth in the morning and at bedtime.     clopidogrel (PLAVIX) 75 MG tablet TAKE 1 TABLET(75 MG) BY MOUTH DAILY 90 tablet 3   Coenzyme Q10 (CO Q 10) 100 MG CAPS Take 100 mg by mouth in the morning.     empagliflozin (JARDIANCE) 25 MG TABS tablet Take 25 mg by mouth in the morning.     Evolocumab (REPATHA SURECLICK) 140 MG/ML SOAJ Inject 140 mg into the skin every 14 (fourteen) days. 2 mL 11   Insulin Glargine (TOUJEO SOLOSTAR [place]) Inject 50 Units into the skin at bedtime.     mexiletine (MEXITIL) 150 MG capsule TAKE 1 CAPSULE(150 MG) BY MOUTH EVERY 12 HOURS 180 capsule 3   nitroGLYCERIN (NITROSTAT) 0.4 MG SL tablet Place 1 tablet (0.4 mg total) under the tongue every 5 (five) minutes as needed for chest pain. 30 tablet 1   NOVOLOG FLEXPEN 100 UNIT/ML FlexPen Inject 5 Units into the skin See admin instructions. Inject 5 units into the skin in the morning before breakfast and two more times a day before meals as needed for an elevated BGL     OXYGEN Inhale into the lungs daily. 3-liters daily.     pantoprazole (PROTONIX) 40 MG tablet Take 40 mg by mouth every  evening.      pramipexole (MIRAPEX) 1 MG tablet Take 0.5 mg by mouth in the morning, at noon, in the evening, and at bedtime.     furosemide (LASIX) 40 MG tablet Take 1.5 tablets (60 mg total) by mouth in the morning AND 1 tablet (40 mg total) every evening. 180 tablet 4   sacubitril-valsartan (ENTRESTO) 49-51 MG Take 1 tablet by mouth 2 (two) times daily. 60 tablet 11   spironolactone (ALDACTONE) 25 MG tablet Take 1 tablet (25 mg total) by mouth at bedtime.     No current facility-administered  medications for this encounter.   Wt Readings from Last 3 Encounters:  07/10/23 94.7 kg (208 lb 12.8 oz)  12/05/22 93.4 kg (205 lb 12.8 oz)  08/05/22 95.1 kg (209 lb 9.6 oz)   BP 116/68   Pulse 66   Wt 94.7 kg (208 lb 12.8 oz)   SpO2 94%   BMI 31.75 kg/m  General: NAD Neck: No JVD, no thyromegaly or thyroid nodule.  Lungs: Dry crackles at bases.  CV: Nondisplaced PMI.  Heart regular S1/S2, no S3/S4, no murmur.  No peripheral edema.  No carotid bruit.  Normal pedal pulses.  Abdomen: Soft, nontender, no hepatosplenomegaly, no distention.  Skin: Intact without lesions or rashes.  Neurologic: Alert and oriented x 3.  Psych: Normal affect. Extremities: No clubbing or cyanosis.  HEENT: Normal.   Assessment/Plan: 1. CAD: Cath in 5/20 showed chronically occluded RCA with patent LCx system stents.  No interventional target.  No chest pain.  He does not take statins due to myalgias.  - Continue ASA 81 daily and Plavix 75 mg daily.   - Continue Praluent, check lipids today.    2. Chronic systolic CHF: Ischemic cardiomyopathy.  Medtronic ICD.  Narrow QRS so not CRT candidate.  Echo in 5/20 with EF 20-25%, but echo in 2/21 looked better, with EF 35-40%.  CPX in 3/21 with moderate HF limitation.  Echo 6/22 with EF 35-40%. Echo in 5/23 with stable EF 35-40%. Echo in 5/24 with EF 40-45%, normal RV.  NYHA class IIIb symptoms, I suspect this is due to ILD.  Based on exam and Optivol, he is not volume  overloaded.  However, REDs clip elevated at 47%.  I am not sure how accurate this is with his interstitial lung disease.  - Continue Coreg 12.5 mg bid, will not increase with low BP at times.  - Continue Entresto 49/51 mg bid (unable to tolerate higher dose).  - I will have him increase Lasix incrementally to 60 qam/40 qpm.  BMET/BNP today and again in 10 days.  - Continue spironolactone 25 mg daily.  - Continue empagliflozin.  3. OSA: Untreated, sounds severe based on his wife's description.  He has not been able to tolerate CPAP in the past and is not willing to try again. I suspect this is a major reason why he is tired/sleepy during the day.  4. PVCs: History of PVCs, now on mexiletine. Most recent Zio patch in 3/21 with only 2% PVCs.  There was question of amiodarone lung toxicity during 5/21 admission and amiodarone was stopped, but pulmonary consult thought more likely UIP. He denies palpitations.  - Continue mexiletine.  5. ILD: Restrictive PFTs noted with CPX.  CT chest in 4/21 concerning for UIP.  I stopped amiodarone due to concern that this could represent amiodarone lung toxicity, but pulmonary thought less likely than UIP.  He saw pulmonary and decided against treatment with Esbriet. ILD appears steadily progressive, now on home oxygen 3L [place].  - He continues to follow with pulmonary, does not want ILD treatment.  - I will arrange for repeat high resolution CT chest to assess progression.  6. Fatigue: Likely multifactorial, likely OSA and ILD contributing.  - Continue oxygen at all times.  Marca Ancona  07/12/2023

## 2023-07-21 ENCOUNTER — Ambulatory Visit (INDEPENDENT_AMBULATORY_CARE_PROVIDER_SITE_OTHER): Payer: Medicare Other

## 2023-07-21 DIAGNOSIS — I255 Ischemic cardiomyopathy: Secondary | ICD-10-CM

## 2023-07-22 LAB — CUP PACEART REMOTE DEVICE CHECK
Battery Remaining Longevity: 87 mo
Battery Voltage: 2.97 V
Brady Statistic RV Percent Paced: 0.02 %
Date Time Interrogation Session: 20241203012303
HighPow Impedance: 87 Ohm
Implantable Lead Connection Status: 753985
Implantable Lead Implant Date: 20191202
Implantable Lead Location: 753860
Implantable Pulse Generator Implant Date: 20191202
Lead Channel Impedance Value: 323 Ohm
Lead Channel Impedance Value: 380 Ohm
Lead Channel Pacing Threshold Amplitude: 0.75 V
Lead Channel Pacing Threshold Pulse Width: 0.4 ms
Lead Channel Sensing Intrinsic Amplitude: 4 mV
Lead Channel Sensing Intrinsic Amplitude: 4 mV
Lead Channel Setting Pacing Amplitude: 2.5 V
Lead Channel Setting Pacing Pulse Width: 0.4 ms
Lead Channel Setting Sensing Sensitivity: 0.3 mV
Zone Setting Status: 755011
Zone Setting Status: 755011

## 2023-07-27 ENCOUNTER — Ambulatory Visit: Payer: Medicare Other | Attending: Cardiology | Admitting: Cardiology

## 2023-07-27 ENCOUNTER — Encounter: Payer: Self-pay | Admitting: Cardiology

## 2023-07-27 VITALS — BP 92/58 | HR 84 | Ht 68.0 in | Wt 214.4 lb

## 2023-07-27 DIAGNOSIS — I2510 Atherosclerotic heart disease of native coronary artery without angina pectoris: Secondary | ICD-10-CM | POA: Diagnosis present

## 2023-07-27 DIAGNOSIS — I472 Ventricular tachycardia, unspecified: Secondary | ICD-10-CM | POA: Diagnosis present

## 2023-07-27 DIAGNOSIS — I5022 Chronic systolic (congestive) heart failure: Secondary | ICD-10-CM | POA: Insufficient documentation

## 2023-07-27 DIAGNOSIS — I10 Essential (primary) hypertension: Secondary | ICD-10-CM | POA: Insufficient documentation

## 2023-07-27 NOTE — Progress Notes (Signed)
  Electrophysiology Office Note:   Date:  07/27/2023  ID:  [name], DOB 1956-05-13, MRN [id number]  Primary Cardiologist: Steve Rotunda, MD Electrophysiologist: Steve Lemming, MD      History of Present Illness:   [name] is a 67 y.o. male with h/o chronic systolic heart failure due to ischemic cardiomyopathy, coronary artery disease, hypertension, OSA, type 2 diabetes seen today for routine electrophysiology followup.   Since last being seen in our clinic the patient reports doing well from a cardiac perspective.  He has no chest pain and no shortness of breath.  He is able to do his daily activities, only limited by lung issues.  He feels that his pulmonary fibrosis is worsening.  He is requiring 3 L of oxygen..  he denies chest pain, palpitations, dyspnea, PND, orthopnea, nausea, vomiting, dizziness, syncope, edema, weight gain, or early satiety.   Review of systems complete and found to be negative unless listed in HPI.      EP Information / Studies Reviewed:    EKG is not ordered today. EKG from 06/30/2023 reviewed which showed sinus rhythm, right bundle branch block      ICD Interrogation-  reviewed in detail today,  See PACEART report.  Device History: Medtronic Single Chamber ICD implanted 09/29/2017 for chronic systolic heart failure History of appropriate therapy: Yes History of AAD therapy: Yes; currently on mexiletine    Risk Assessment/Calculations:              Physical Exam:   VS:  BP (!) 92/58   Pulse 84   Ht 5\' 8"  (1.727 m)   Wt 214 lb 6.4 oz (97.3 kg)   SpO2 (!) 88%   BMI 32.60 kg/m    Wt Readings from Last 3 Encounters:  07/27/23 214 lb 6.4 oz (97.3 kg)  07/10/23 208 lb 12.8 oz (94.7 kg)  12/05/22 205 lb 12.8 oz (93.4 kg)     GEN: Well nourished, well developed in no acute distress NECK: No JVD; No carotid bruits CARDIAC: Regular rate and rhythm, no murmurs, rubs, gallops RESPIRATORY:  Clear to auscultation without rales,  wheezing or rhonchi  ABDOMEN: Soft, non-tender, non-distended EXTREMITIES:  No edema; No deformity   ASSESSMENT AND PLAN:    Chronic systolic dysfunction s/p Medtronic single chamber ICD  euvolemic today Stable on an appropriate medical regimen Normal ICD function See Pace Art report No changes today  2.  Coronary artery disease: Post multiple stents.  No current chest pain.  Plan per primary cardiology.  3.  Ventricular tachycardia: Currently on mexiletine.  Has ILD and trying to avoid amiodarone.  No obvious recurrence.  4.  Hypertension: Currently well-controlled  Disposition:   Follow up with Dr. Elberta Ramos in 12 months   Signed, Steve Lograsso [name], MD

## 2023-07-30 LAB — CUP PACEART INCLINIC DEVICE CHECK
Battery Remaining Longevity: 85 mo
Battery Voltage: 2.99 V
Brady Statistic RV Percent Paced: 0.02 %
Date Time Interrogation Session: 20241209141100
HighPow Impedance: 82 Ohm
Implantable Lead Connection Status: 753985
Implantable Lead Implant Date: 20191202
Implantable Lead Location: 753860
Implantable Pulse Generator Implant Date: 20191202
Lead Channel Impedance Value: 285 Ohm
Lead Channel Impedance Value: 380 Ohm
Lead Channel Pacing Threshold Amplitude: 0.75 V
Lead Channel Pacing Threshold Pulse Width: 0.4 ms
Lead Channel Sensing Intrinsic Amplitude: 3 mV
Lead Channel Sensing Intrinsic Amplitude: 3.875 mV
Lead Channel Setting Pacing Amplitude: 2.5 V
Lead Channel Setting Pacing Pulse Width: 0.4 ms
Lead Channel Setting Sensing Sensitivity: 0.3 mV
Zone Setting Status: 755011
Zone Setting Status: 755011

## 2023-08-19 DIAGNOSIS — I5020 Unspecified systolic (congestive) heart failure: Secondary | ICD-10-CM | POA: Diagnosis not present

## 2023-08-19 DIAGNOSIS — J189 Pneumonia, unspecified organism: Secondary | ICD-10-CM | POA: Diagnosis not present

## 2023-08-19 DIAGNOSIS — J9601 Acute respiratory failure with hypoxia: Secondary | ICD-10-CM | POA: Diagnosis not present

## 2023-09-14 ENCOUNTER — Ambulatory Visit: Payer: HMO | Attending: Cardiology

## 2023-09-14 DIAGNOSIS — I472 Ventricular tachycardia, unspecified: Secondary | ICD-10-CM

## 2023-09-14 LAB — CUP PACEART INCLINIC DEVICE CHECK
Battery Remaining Longevity: 84 mo
Battery Voltage: 3 V
Brady Statistic RV Percent Paced: 0.02 %
Date Time Interrogation Session: 20250127154908
HighPow Impedance: 78 Ohm
Implantable Lead Connection Status: 753985
Implantable Lead Implant Date: 20191202
Implantable Lead Location: 753860
Implantable Pulse Generator Implant Date: 20191202
Lead Channel Impedance Value: 285 Ohm
Lead Channel Impedance Value: 380 Ohm
Lead Channel Pacing Threshold Amplitude: 0.875 V
Lead Channel Pacing Threshold Pulse Width: 0.4 ms
Lead Channel Sensing Intrinsic Amplitude: 3.375 mV
Lead Channel Sensing Intrinsic Amplitude: 3.375 mV
Lead Channel Setting Pacing Amplitude: 2.5 V
Lead Channel Setting Pacing Pulse Width: 0.4 ms
Lead Channel Setting Sensing Sensitivity: 0.3 mV
Zone Setting Status: 755011
Zone Setting Status: 755011

## 2023-09-14 NOTE — Progress Notes (Signed)
Patient came in today for B>AX pathway programming.  Also noted recent elevation in optivol which as of 1/25 has returned to normal.   Patient notes some recent elevations in fluid retention, he is taking his lasix, no missed doses and will watch his NA and continue to monitor.  If not improving, he will follow up with Dr. Shirlee Latch.    PROGRAMMING CHANGES MADE:  B>AX pathways have all been corrected.

## 2023-09-16 ENCOUNTER — Other Ambulatory Visit (HOSPITAL_COMMUNITY): Payer: Self-pay

## 2023-09-16 DIAGNOSIS — I5022 Chronic systolic (congestive) heart failure: Secondary | ICD-10-CM

## 2023-09-16 MED ORDER — CARVEDILOL 12.5 MG PO TABS: 12.5000 mg | ORAL_TABLET | Freq: Two times a day (BID) | ORAL | 5 refills | Status: AC

## 2023-09-21 DIAGNOSIS — J8410 Pulmonary fibrosis, unspecified: Secondary | ICD-10-CM | POA: Diagnosis not present

## 2023-09-21 DIAGNOSIS — E1122 Type 2 diabetes mellitus with diabetic chronic kidney disease: Secondary | ICD-10-CM | POA: Diagnosis not present

## 2023-09-21 DIAGNOSIS — E1165 Type 2 diabetes mellitus with hyperglycemia: Secondary | ICD-10-CM | POA: Diagnosis not present

## 2023-09-21 DIAGNOSIS — J42 Unspecified chronic bronchitis: Secondary | ICD-10-CM | POA: Diagnosis not present

## 2023-09-21 DIAGNOSIS — E785 Hyperlipidemia, unspecified: Secondary | ICD-10-CM | POA: Diagnosis not present

## 2023-09-21 DIAGNOSIS — I5022 Chronic systolic (congestive) heart failure: Secondary | ICD-10-CM | POA: Diagnosis not present

## 2023-09-21 DIAGNOSIS — Z6831 Body mass index (BMI) 31.0-31.9, adult: Secondary | ICD-10-CM | POA: Diagnosis not present

## 2023-09-21 DIAGNOSIS — Z794 Long term (current) use of insulin: Secondary | ICD-10-CM | POA: Diagnosis not present

## 2023-09-21 DIAGNOSIS — I2510 Atherosclerotic heart disease of native coronary artery without angina pectoris: Secondary | ICD-10-CM | POA: Diagnosis not present

## 2023-09-21 DIAGNOSIS — I10 Essential (primary) hypertension: Secondary | ICD-10-CM | POA: Diagnosis not present

## 2023-09-21 DIAGNOSIS — G4733 Obstructive sleep apnea (adult) (pediatric): Secondary | ICD-10-CM | POA: Diagnosis not present

## 2023-09-21 DIAGNOSIS — E1169 Type 2 diabetes mellitus with other specified complication: Secondary | ICD-10-CM | POA: Diagnosis not present

## 2023-10-07 NOTE — Progress Notes (Incomplete)
[facility]  PCP: Jerrye Bushy, FNP Cardiology: Dr. Antoine Poche HF Cardiology: Dr. Shirlee Latch  68 y.o. with history of CAD, ischemic cardiomyopathy/chronic systolic CHF, OSA, and VT was referred by Dr. Antoine Poche for evaluation of CHF.  Patient has a long history of CAD (see PMH below).  Last cath in 5/20 showed a chronically occluded RCA with collaterals and stable stents in the LCx system, no intervention.  He has had an ischemic cardiomyopathy with EF 20-25% in 5/20, and actually up to 35-40% on 2/21 echo. He has a Medtronic ICD and is on amiodarone for VT. He has significant OSA but has not been able to tolerate CPAP with a facemask.    CPX was done in 3/21, study was submaximal with moderate HF limitation but also functional limitation from pulmonary restriction and exertional hypoxemia.  V/Q scan was done and was not suggestive of chronic PE.  Zio patch in 3/21 showed 2% PVCs.   He was admitted in 5/21 with PNA on a background of interstitial lung disease (looks like UIP on high resolution CT chest from 4/21).  Seen by Dr. Isaiah Serge with pulmonary, thought probably not amiodarone lung toxicity but we stopped amiodarone and put him on mexiletine.  He was on oxygen in the hospital.  He decided not to start Esbriet for pulmonary fibrosis.   Echo 6/22 EF 35-40% with inferior and inferolateral hypokinesis, mild RV enlargement, mildly decreased RV systolic function.   In 5/23, he was admitted with PNA.  Echo at that time showed EF 35-40%, mild MR, mildly decreased RV systolic function.   Echo in 5/24 showed EF 40-45%, normal RV.   Today he returns for HF follow up with his wife. He is now using 3L home oxygen at all times.  He is short of breath "all the time."  He is dyspneic with ADLs such as dressing and showering.  He was able to walk into the office today with oxygen.  No chest pain.  He reports numbness and tingling of his fingers bilaterally.  No orthopnea/PND.  Weight up 3 lbs.  BP runs low at times.   Medtronic device interrogation:  No VT, no AF, stable thoracic impedance.   ECG (personally reviewed): NSR, RBBB, inferior Qs.   REDS clip 47%  Labs (9/20): K 4.5, creatinine 1.08 Labs (3/21): hgb 16.9, K 4.5, creatinine 1.3, AST 34, ALT 54 (mildly elevated), LDL 51, TGs 373, TSH normal.  Labs (5/21): K 3.9, creatinine 1.07 Labs (7/21): K 3.9, creatinine 0.94, LDL 46, HDL 38 Labs (3/22): K 4.1, creatinine 1.0, LDL 45, HDL 35 Labs (6/22): K 4.0, creatinine 1.0 Labs (9/22): K 4.2, creatinine 0.97 Labs (12/22): BNP 18, K 4.3, creatinine 0.84 Labs (3/23): LDL 66, TGs 294 Labs (6/23): K 3.8, creatinine 0.97 Labs (9/23): K 3.7, creatinine 1.03, BNP 37 Labs (12/23): K 4.0, creatinine 1.01, LDL 17 Labs (4/24): K 4, creatinine 1.17, BNP 46  PMH: 1. CAD:  - PCI to OM1 in 2012.  - DES to LCx in 5/19.  - LHC (5/20): 50% proximal LAD, 95% proximal D1, patent LCx and OM1 stents, total occlusion of RCA with collaterals.  2. Chronic systolic CHF: Ischemic cardiomyopathy. Medtronic ICD.  - RHC (5/20): mean RA 3, PA 25/5, mean PCWP 6, CI 1.86 - Echo (5/20): EF 20-25% - Echo (2/21): EF 35-40%, mild LV dilation, RV low normal function.  - CPX (3/21): peak VO2 17.6, VE/VCO2 slope 43, RER 0.96 => submaximal, moderate HF limitation but also  noted is pulmonary restriction with exertional hypoxemia.  - Echo (6/22): EF 35-40% with inferior and inferolateral hypokinesis, mild RV enlargement, mildly decreased RV systolic function. - Echo (5/23): EF 35-40%, mild MR, mildly decreased RV systolic function. - Echo (5/24): EF 40-45%, normal RV. 3. VT: Amiodarone was stopped with lung disease.  4. OSA: Has not tolerate CPAP.  5. HTN 6. Type 2 diabetes 7. Hyperlipidemia: Has not tolerated statins.  8. PVCs - Zio patch (3/21) with 2% PVCs 9. Interstitial lung disease: high resolution CT chest in 4/21 concerning for usual interstitial pneumonitis.  SH: Married, lives in [place],  retired, quit smoking in 2007.   Family History  Problem Relation Age of Onset   Lymphoma Mother    Heart attack Father 55   Heart attack Brother 43   ROS: All systems reviewed and negative except as per HPI.   Current Outpatient Medications  Medication Sig Dispense Refill   acetaminophen (TYLENOL) 500 MG tablet Take 500 mg by mouth every 6 (six) hours as needed (for headaches.).     albuterol (VENTOLIN HFA) 108 (90 Base) MCG/ACT inhaler Inhale 1 puff into the lungs every 6 (six) hours as needed for wheezing or shortness of breath. 8 g 0   aspirin 81 MG chewable tablet Chew 1 tablet (81 mg total) by mouth daily.     BD PEN NEEDLE NANO 2ND GEN 32G X 4 MM MISC See admin instructions.     carvedilol (COREG) 12.5 MG tablet Take 1 tablet (12.5 mg total) by mouth 2 (two) times daily with a meal. 60 tablet 5   CINNAMON PO Take 1,000 mg by mouth in the morning and at bedtime.     clopidogrel (PLAVIX) 75 MG tablet TAKE 1 TABLET(75 MG) BY MOUTH DAILY 90 tablet 3   Coenzyme Q10 (CO Q 10) 100 MG CAPS Take 100 mg by mouth in the morning.     empagliflozin (JARDIANCE) 25 MG TABS tablet Take 25 mg by mouth in the morning.     Evolocumab (REPATHA SURECLICK) 140 MG/ML SOAJ Inject 140 mg into the skin every 14 (fourteen) days. 2 mL 11   furosemide (LASIX) 40 MG tablet Take 1.5 tablets (60 mg total) by mouth in the morning AND 1 tablet (40 mg total) every evening. 180 tablet 4   Insulin Glargine (TOUJEO SOLOSTAR [place]) Inject 50 Units into the skin at bedtime.     mexiletine (MEXITIL) 150 MG capsule TAKE 1 CAPSULE(150 MG) BY MOUTH EVERY 12 HOURS 180 capsule 3   nitroGLYCERIN (NITROSTAT) 0.4 MG SL tablet Place 1 tablet (0.4 mg total) under the tongue every 5 (five) minutes as needed for chest pain. 30 tablet 1   NOVOLOG FLEXPEN 100 UNIT/ML FlexPen Inject 5 Units into the skin See admin instructions. Inject 5 units into the skin in the morning before breakfast and two more times a day before meals as needed for an  elevated BGL     OXYGEN Inhale into the lungs daily. 3-liters daily.     pantoprazole (PROTONIX) 40 MG tablet Take 40 mg by mouth every evening.      pramipexole (MIRAPEX) 1 MG tablet Take 0.5 mg by mouth in the morning, at noon, in the evening, and at bedtime.     sacubitril-valsartan (ENTRESTO) 49-51 MG Take 1 tablet by mouth 2 (two) times daily. 60 tablet 11   spironolactone (ALDACTONE) 25 MG tablet Take 1 tablet (25 mg total) by mouth at bedtime.     No current facility-administered  medications for this visit.   Wt Readings from Last 3 Encounters:  07/27/23 97.3 kg (214 lb 6.4 oz)  07/10/23 94.7 kg (208 lb 12.8 oz)  12/05/22 93.4 kg (205 lb 12.8 oz)   There were no vitals taken for this visit. General: NAD Neck: No JVD, no thyromegaly or thyroid nodule.  Lungs: Dry crackles at bases.  CV: Nondisplaced PMI.  Heart regular S1/S2, no S3/S4, no murmur.  No peripheral edema.  No carotid bruit.  Normal pedal pulses.  Abdomen: Soft, nontender, no hepatosplenomegaly, no distention.  Skin: Intact without lesions or rashes.  Neurologic: Alert and oriented x 3.  Psych: Normal affect. Extremities: No clubbing or cyanosis.  HEENT: Normal.   Assessment/Plan: 1. CAD: Cath in 5/20 showed chronically occluded RCA with patent LCx system stents.  No interventional target.  No chest pain.  He does not take statins due to myalgias.  - Continue ASA 81 daily and Plavix 75 mg daily.   - Continue Praluent, check lipids today.    2. Chronic systolic CHF: Ischemic cardiomyopathy.  Medtronic ICD.  Narrow QRS so not CRT candidate.  Echo in 5/20 with EF 20-25%, but echo in 2/21 looked better, with EF 35-40%.  CPX in 3/21 with moderate HF limitation.  Echo 6/22 with EF 35-40%. Echo in 5/23 with stable EF 35-40%. Echo in 5/24 with EF 40-45%, normal RV.  NYHA class IIIb symptoms, I suspect this is due to ILD.  Based on exam and Optivol, he is not volume overloaded.  However, REDs clip elevated at 47%.  I am not  sure how accurate this is with his interstitial lung disease.  - Continue Coreg 12.5 mg bid, will not increase with low BP at times.  - Continue Entresto 49/51 mg bid (unable to tolerate higher dose).  - I will have him increase Lasix incrementally to 60 qam/40 qpm.  BMET/BNP today and again in 10 days.  - Continue spironolactone 25 mg daily.  - Continue empagliflozin.  3. OSA: Untreated, sounds severe based on his wife's description.  He has not been able to tolerate CPAP in the past and is not willing to try again. I suspect this is a major reason why he is tired/sleepy during the day.  4. PVCs: History of PVCs, now on mexiletine. Most recent Zio patch in 3/21 with only 2% PVCs.  There was question of amiodarone lung toxicity during 5/21 admission and amiodarone was stopped, but pulmonary consult thought more likely UIP. He denies palpitations.  - Continue mexiletine.  5. ILD: Restrictive PFTs noted with CPX.  CT chest in 4/21 concerning for UIP.  I stopped amiodarone due to concern that this could represent amiodarone lung toxicity, but pulmonary thought less likely than UIP.  He saw pulmonary and decided against treatment with Esbriet. ILD appears steadily progressive, now on home oxygen 3L .  - He continues to follow with pulmonary, does not want ILD treatment.  - I will arrange for repeat high resolution CT chest to assess progression.  6. Fatigue: Likely multifactorial, likely OSA and ILD contributing.  - Continue oxygen at all times.  Anderson Malta [facility]  10/07/2023

## 2023-10-09 ENCOUNTER — Encounter (HOSPITAL_COMMUNITY): Payer: Medicare Other

## 2023-10-20 ENCOUNTER — Ambulatory Visit (INDEPENDENT_AMBULATORY_CARE_PROVIDER_SITE_OTHER): Payer: Medicare Other

## 2023-10-20 DIAGNOSIS — I5022 Chronic systolic (congestive) heart failure: Secondary | ICD-10-CM | POA: Diagnosis not present

## 2023-10-20 DIAGNOSIS — I255 Ischemic cardiomyopathy: Secondary | ICD-10-CM

## 2023-10-22 LAB — CUP PACEART REMOTE DEVICE CHECK
Battery Remaining Longevity: 84 mo
Battery Voltage: 2.96 V
Brady Statistic RV Percent Paced: 0.03 %
Date Time Interrogation Session: 20250304001703
HighPow Impedance: 73 Ohm
Implantable Lead Connection Status: 753985
Implantable Lead Implant Date: 20191202
Implantable Lead Location: 753860
Implantable Pulse Generator Implant Date: 20191202
Lead Channel Impedance Value: 285 Ohm
Lead Channel Impedance Value: 342 Ohm
Lead Channel Pacing Threshold Amplitude: 0.75 V
Lead Channel Pacing Threshold Pulse Width: 0.4 ms
Lead Channel Sensing Intrinsic Amplitude: 2.25 mV
Lead Channel Sensing Intrinsic Amplitude: 2.25 mV
Lead Channel Setting Pacing Amplitude: 2.5 V
Lead Channel Setting Pacing Pulse Width: 0.4 ms
Lead Channel Setting Sensing Sensitivity: 0.3 mV
Zone Setting Status: 755011
Zone Setting Status: 755011

## 2023-10-28 DIAGNOSIS — F331 Major depressive disorder, recurrent, moderate: Secondary | ICD-10-CM | POA: Diagnosis not present

## 2023-10-28 DIAGNOSIS — I130 Hypertensive heart and chronic kidney disease with heart failure and stage 1 through stage 4 chronic kidney disease, or unspecified chronic kidney disease: Secondary | ICD-10-CM | POA: Diagnosis not present

## 2023-10-28 DIAGNOSIS — J8410 Pulmonary fibrosis, unspecified: Secondary | ICD-10-CM | POA: Diagnosis not present

## 2023-10-28 DIAGNOSIS — I509 Heart failure, unspecified: Secondary | ICD-10-CM | POA: Diagnosis not present

## 2023-10-28 DIAGNOSIS — D6869 Other thrombophilia: Secondary | ICD-10-CM | POA: Diagnosis not present

## 2023-10-28 DIAGNOSIS — I209 Angina pectoris, unspecified: Secondary | ICD-10-CM | POA: Diagnosis not present

## 2023-10-28 DIAGNOSIS — I4891 Unspecified atrial fibrillation: Secondary | ICD-10-CM | POA: Diagnosis not present

## 2023-10-28 DIAGNOSIS — E261 Secondary hyperaldosteronism: Secondary | ICD-10-CM | POA: Diagnosis not present

## 2023-10-28 DIAGNOSIS — E1169 Type 2 diabetes mellitus with other specified complication: Secondary | ICD-10-CM | POA: Diagnosis not present

## 2023-10-28 DIAGNOSIS — Z794 Long term (current) use of insulin: Secondary | ICD-10-CM | POA: Diagnosis not present

## 2023-10-28 DIAGNOSIS — J9610 Chronic respiratory failure, unspecified whether with hypoxia or hypercapnia: Secondary | ICD-10-CM | POA: Diagnosis not present

## 2023-10-28 DIAGNOSIS — E1122 Type 2 diabetes mellitus with diabetic chronic kidney disease: Secondary | ICD-10-CM | POA: Diagnosis not present

## 2023-11-05 ENCOUNTER — Telehealth (HOSPITAL_COMMUNITY): Payer: Self-pay

## 2023-11-05 NOTE — Telephone Encounter (Signed)
 Called and spoke to pt's wife Bonita Quin to confirm/remind patient of their appointment at the [facility] on 11/06/23.   Appointment:   [x] Confirmed  [] Left mess   [] No answer/No voice mail  [] Phone not in service  Patient reminded to bring all medications and/or complete list.  Confirmed patient has transportation. Gave directions, instructed to utilize valet parking.

## 2023-11-06 ENCOUNTER — Ambulatory Visit (HOSPITAL_COMMUNITY)
Admission: RE | Admit: 2023-11-06 | Discharge: 2023-11-06 | Disposition: A | Discharge status: Home / Self Care | Payer: Self-pay | Source: Ambulatory Visit | Attending: Family Medicine | Admitting: Family Medicine

## 2023-11-06 ENCOUNTER — Encounter (HOSPITAL_COMMUNITY): Payer: Self-pay

## 2023-11-06 ENCOUNTER — Other Ambulatory Visit (HOSPITAL_COMMUNITY): Payer: Self-pay

## 2023-11-06 VITALS — BP 90/62 | HR 64 | Wt 212.4 lb

## 2023-11-06 DIAGNOSIS — Z7902 Long term (current) use of antithrombotics/antiplatelets: Secondary | ICD-10-CM | POA: Diagnosis not present

## 2023-11-06 DIAGNOSIS — I110 Hypertensive heart disease with heart failure: Secondary | ICD-10-CM | POA: Diagnosis not present

## 2023-11-06 DIAGNOSIS — R0602 Shortness of breath: Secondary | ICD-10-CM | POA: Diagnosis not present

## 2023-11-06 DIAGNOSIS — Z7982 Long term (current) use of aspirin: Secondary | ICD-10-CM | POA: Diagnosis not present

## 2023-11-06 DIAGNOSIS — Z955 Presence of coronary angioplasty implant and graft: Secondary | ICD-10-CM | POA: Diagnosis not present

## 2023-11-06 DIAGNOSIS — J849 Interstitial pulmonary disease, unspecified: Secondary | ICD-10-CM | POA: Diagnosis not present

## 2023-11-06 DIAGNOSIS — Z79899 Other long term (current) drug therapy: Secondary | ICD-10-CM | POA: Diagnosis not present

## 2023-11-06 DIAGNOSIS — Z9981 Dependence on supplemental oxygen: Secondary | ICD-10-CM | POA: Diagnosis not present

## 2023-11-06 DIAGNOSIS — I255 Ischemic cardiomyopathy: Secondary | ICD-10-CM | POA: Insufficient documentation

## 2023-11-06 DIAGNOSIS — Z87891 Personal history of nicotine dependence: Secondary | ICD-10-CM | POA: Insufficient documentation

## 2023-11-06 DIAGNOSIS — G4733 Obstructive sleep apnea (adult) (pediatric): Secondary | ICD-10-CM | POA: Diagnosis not present

## 2023-11-06 DIAGNOSIS — I2510 Atherosclerotic heart disease of native coronary artery without angina pectoris: Secondary | ICD-10-CM | POA: Insufficient documentation

## 2023-11-06 DIAGNOSIS — I493 Ventricular premature depolarization: Secondary | ICD-10-CM | POA: Insufficient documentation

## 2023-11-06 DIAGNOSIS — I5022 Chronic systolic (congestive) heart failure: Secondary | ICD-10-CM | POA: Diagnosis not present

## 2023-11-06 DIAGNOSIS — R5383 Other fatigue: Secondary | ICD-10-CM

## 2023-11-06 LAB — BASIC METABOLIC PANEL
Anion gap: 10 (ref 5–15)
BUN: 18 mg/dL (ref 8–23)
CO2: 23 mmol/L (ref 22–32)
Calcium: 9.3 mg/dL (ref 8.9–10.3)
Chloride: 101 mmol/L (ref 98–111)
Creatinine, Ser: 1.09 mg/dL (ref 0.61–1.24)
GFR, Estimated: >60 mL/min (ref >60)
Glucose, Bld: 176 mg/dL — ABNORMAL HIGH (ref 70–99)
Potassium: 3.8 mmol/L (ref 3.5–5.1)
Sodium: 134 mmol/L — ABNORMAL LOW (ref 135–145)

## 2023-11-06 LAB — BRAIN NATRIURETIC PEPTIDE: B Natriuretic Peptide: 99.5 pg/mL (ref 0.0–100.0)

## 2023-11-06 MED ORDER — POTASSIUM CHLORIDE CRYS ER 20 MEQ PO TBCR: 20.0000 meq | EXTENDED_RELEASE_TABLET | Freq: Every day | ORAL | 3 refills | Status: AC

## 2023-11-06 MED ORDER — NITROGLYCERIN 0.4 MG SL SUBL: 0.4000 mg | SUBLINGUAL_TABLET | SUBLINGUAL | 1 refills | Status: AC | PRN

## 2023-11-06 MED ORDER — FUROSEMIDE 40 MG PO TABS: 60.0000 mg | ORAL_TABLET | Freq: Two times a day (BID) | ORAL | 4 refills | Status: AC

## 2023-11-06 NOTE — Patient Instructions (Signed)
 INCREASE Lasix to 60 mg Twice daily  START Potassium ( 1 Tab) daily.  Labs done today, your results will be available in MyChart, we will contact you for abnormal readings.  Repeat blood work in 2 weeks.  Your physician has requested that you have an echocardiogram. Echocardiography is a painless test that uses sound waves to create images of your heart. It provides your doctor with information about the size and shape of your heart and how well your heart's chambers and valves are working. This procedure takes approximately one hour. There are no restrictions for this procedure. Please do NOT wear cologne, perfume, aftershave, or lotions (deodorant is allowed). Please arrive 15 minutes prior to your appointment time.  Please note: We ask at that you not bring children with you during ultrasound (echo/ vascular) testing. Due to room size and safety concerns, children are not allowed in the ultrasound rooms during exams. Our front office staff cannot provide observation of children in our lobby area while testing is being conducted. An adult accompanying a patient to their appointment will only be allowed in the ultrasound room at the discretion of the ultrasound technician under special circumstances. We apologize for any inconvenience.   Your physician recommends that you schedule a follow-up appointment in: 4 months with an echocardiogram (July) ** PLEASE CALL THE OFFICE IN MAY TO ARRANGE YOUR FOLLOW UP APPOINTMENT.**  If you have any questions or concerns before your next appointment please send [place] a message through [place] or call our office at [phone].    TO LEAVE A MESSAGE FOR THE NURSE SELECT OPTION 2, PLEASE LEAVE A MESSAGE INCLUDING: YOUR NAME DATE OF BIRTH CALL BACK NUMBER REASON FOR CALL**this is important as we prioritize the call backs  YOU WILL RECEIVE A CALL BACK THE SAME DAY AS LONG AS YOU CALL BEFORE 4:00 PM  At the [facility], you and your  health needs are our priority. As part of our continuing mission to provide you with exceptional heart care, we have created designated Provider Care Teams. These Care Teams include your primary Cardiologist (physician) and Advanced Practice Providers (APPs- Physician Assistants and Nurse Practitioners) who all work together to provide you with the care you need, when you need it.   You may see any of the following providers on your designated Care Team at your next follow up: Dr Arvilla Meres Dr Marca Ancona Dr. Dorthula Nettles Dr. Clearnce Hasten Amy Filbert Schilder, NP Robbie Lis, [place] [facility] [place], [place] Brynda Peon, NP [place] Lee, NP Clarisa Kindred, NP Karle Plumber, PharmD Enos Fling, PharmD   Please be sure to bring in all your medications bottles to every appointment.    Thank you for choosing [facility]

## 2023-11-06 NOTE — Progress Notes (Signed)
 [facility]  PCP: Jerrye Bushy, FNP Cardiology: Dr. Antoine Poche HF Cardiology: Dr. Shirlee Latch  68 y.o. with history of CAD, ischemic cardiomyopathy/chronic systolic CHF, OSA, and VT was referred by Dr. Antoine Poche for evaluation of CHF.  Patient has a long history of CAD (see PMH below).  Last cath in 5/20 showed a chronically occluded RCA with collaterals and stable stents in the LCx system, no intervention.  He has had an ischemic cardiomyopathy with EF 20-25% in 5/20, and actually up to 35-40% on 2/21 echo. He has a Medtronic ICD and is on amiodarone for VT. He has significant OSA but has not been able to tolerate CPAP with a facemask.    CPX was done in 3/21, study was submaximal with moderate HF limitation but also functional limitation from pulmonary restriction and exertional hypoxemia.  V/Q scan was done and was not suggestive of chronic PE.  Zio patch in 3/21 showed 2% PVCs.   He was admitted in 5/21 with PNA on a background of interstitial lung disease (looks like UIP on high resolution CT chest from 4/21).  Seen by Dr. Isaiah Serge with pulmonary, thought probably not amiodarone lung toxicity but we stopped amiodarone and put him on mexiletine.  He was on oxygen in the hospital.  He decided not to start Esbriet for pulmonary fibrosis.   Echo 6/22 EF 35-40% with inferior and inferolateral hypokinesis, mild RV enlargement, mildly decreased RV systolic function.   In 5/23, he was admitted with PNA.  Echo at that time showed EF 35-40%, mild MR, mildly decreased RV systolic function.   Echo in 5/24 showed EF 40-45%, normal RV.   Today he returns for HF follow up. Overall feeling fine. Wears 3L oxygen at all times. He is SOB with minimal activity, including ADLs. Feet swell. Denies palpitations, abnormal bleeding, CP, dizziness, or PND/Orthopnea. Appetite ok. No fever or chills. Weight at home 212 pounds. Taking all medications.   Medtronic device interrogation (personally reviewed):  OptiVol creeping up, 0.5 hr/day activity, < 0.1% VP, no AF  ECG (personally reviewed): none ordered today.  Labs (12/23): K 4.0, creatinine 1.01, LDL 17 Labs (4/24): K 4, creatinine 1.17, BNP 46 Labs (11/24): K 3.7, creatinine 1.14, LDL 22  PMH: 1. CAD:  - PCI to OM1 in 2012.  - DES to LCx in 5/19.  - LHC (5/20): 50% proximal LAD, 95% proximal D1, patent LCx and OM1 stents, total occlusion of RCA with collaterals.  2. Chronic systolic CHF: Ischemic cardiomyopathy. Medtronic ICD.  - RHC (5/20): mean RA 3, PA 25/5, mean PCWP 6, CI 1.86 - Echo (5/20): EF 20-25% - Echo (2/21): EF 35-40%, mild LV dilation, RV low normal function.  - CPX (3/21): peak VO2 17.6, VE/VCO2 slope 43, RER 0.96 => submaximal, moderate HF limitation but also noted is pulmonary restriction with exertional hypoxemia.  - Echo (6/22): EF 35-40% with inferior and inferolateral hypokinesis, mild RV enlargement, mildly decreased RV systolic function. - Echo (5/23): EF 35-40%, mild MR, mildly decreased RV systolic function. - Echo (5/24): EF 40-45%, normal RV. 3. VT: Amiodarone was stopped with lung disease.  4. OSA: Has not tolerate CPAP.  5. HTN 6. Type 2 diabetes 7. Hyperlipidemia: Has not tolerated statins.  8. PVCs - Zio patch (3/21) with 2% PVCs 9. Interstitial lung disease: high resolution CT chest in 4/21 concerning for usual interstitial pneumonitis.  SH: Married, lives in [place], retired, quit smoking in 2007.   Family History  Problem Relation Age of  Onset   Lymphoma Mother    Heart attack Father 27   Heart attack Brother 51   ROS: All systems reviewed and negative except as per HPI.   Current Outpatient Medications  Medication Sig Dispense Refill   acetaminophen (TYLENOL) 500 MG tablet Take 500 mg by mouth every 6 (six) hours as needed (for headaches.).     albuterol (VENTOLIN HFA) 108 (90 Base) MCG/ACT inhaler Inhale 1 puff into the lungs every 6 (six) hours as needed for wheezing or shortness of  breath. 8 g 0   aspirin 81 MG chewable tablet Chew 1 tablet (81 mg total) by mouth daily.     BD PEN NEEDLE NANO 2ND GEN 32G X 4 MM MISC See admin instructions.     carvedilol (COREG) 12.5 MG tablet Take 1 tablet (12.5 mg total) by mouth 2 (two) times daily with a meal. 60 tablet 5   CINNAMON PO Take 1,000 mg by mouth in the morning and at bedtime.     clopidogrel (PLAVIX) 75 MG tablet TAKE 1 TABLET(75 MG) BY MOUTH DAILY 90 tablet 3   Coenzyme Q10 (CO Q 10) 100 MG CAPS Take 100 mg by mouth in the morning.     empagliflozin (JARDIANCE) 25 MG TABS tablet Take 25 mg by mouth in the morning.     Evolocumab (REPATHA SURECLICK) 140 MG/ML SOAJ Inject 140 mg into the skin every 14 (fourteen) days. 2 mL 11   furosemide (LASIX) 40 MG tablet Take 1.5 tablets (60 mg total) by mouth in the morning AND 1 tablet (40 mg total) every evening. (Patient taking differently: Take 1.5 tablets (60 mg total) by mouth in the morning AND 1 tablet (60 mg total) every evening.) 180 tablet 4   Insulin Glargine (TOUJEO SOLOSTAR [place]) Inject 60 Units into the skin at bedtime.     loratadine (CLARITIN) 5 MG/5ML syrup Take 5 mg by mouth daily.     mexiletine (MEXITIL) 150 MG capsule TAKE 1 CAPSULE(150 MG) BY MOUTH EVERY 12 HOURS 180 capsule 3   nitroGLYCERIN (NITROSTAT) 0.4 MG SL tablet Place 1 tablet (0.4 mg total) under the tongue every 5 (five) minutes as needed for chest pain. 30 tablet 1   NOVOLOG FLEXPEN 100 UNIT/ML FlexPen Inject 15 Units into the skin See admin instructions. Inject 5 units into the skin in the morning before breakfast and two more times a day before meals as needed for an elevated BGL     OXYGEN Inhale into the lungs daily. 3-liters daily.     pantoprazole (PROTONIX) 40 MG tablet Take 40 mg by mouth every evening.      pramipexole (MIRAPEX) 1 MG tablet Take 0.5 mg by mouth in the morning, at noon, in the evening, and at bedtime.     sacubitril-valsartan (ENTRESTO) 49-51 MG Take 1 tablet by mouth 2 (two)  times daily. 60 tablet 11   spironolactone (ALDACTONE) 25 MG tablet Take 1 tablet (25 mg total) by mouth at bedtime.     No current facility-administered medications for this encounter.   Wt Readings from Last 3 Encounters:  11/06/23 96.3 kg (212 lb 6.4 oz)  07/27/23 97.3 kg (214 lb 6.4 oz)  07/10/23 94.7 kg (208 lb 12.8 oz)   BP 90/62   Pulse 64   Wt 96.3 kg (212 lb 6.4 oz)   SpO2 93%   BMI 32.30 kg/m  Physical Exam: General:  NAD. No resp difficulty, walked into clinic wearing 3 L oxygen HEENT: Normal Neck:  Supple. Thick neck Cor: Regular rate & rhythm. No rubs, gallops or murmurs. Lungs: Clear, diminished in bases Abdomen: Soft, obese, nontender, nondistended.  Extremities: No cyanosis, clubbing, rash, edema Neuro: Alert & oriented x 3, moves all 4 extremities w/o difficulty. Affect pleasant.  Assessment/Plan: 1. CAD: Cath in 5/20 showed chronically occluded RCA with patent LCx system stents.  No interventional target.  No chest pain.  He does not take statins due to myalgias.  - Continue ASA 81 daily and Plavix 75 mg daily.   - Continue Repatha. Good lipids 11/24 2. Chronic systolic CHF: Ischemic cardiomyopathy.  Medtronic ICD.  Narrow QRS so not CRT candidate.  Echo in 5/20 with EF 20-25%, but echo in 2/21 looked better, with EF 35-40%.  CPX in 3/21 with moderate HF limitation.  Echo 6/22 with EF 35-40%. Echo in 5/23 with stable EF 35-40%. Echo in 5/24 with EF 40-45%, normal RV.  NYHA class IIIb symptoms, I suspect this is due to ILD. Mildly volume up on OptiVol (ReDs have not been accurate fluid measurement for him due to his interstitial lung disease). - Increase Lasix to 60 mg bid, add 20 KCL daily. BMET/BNP today, repeat BMET in 10-14 days - Continue Coreg 12.5 mg bid, will not increase with low BP at times.  - Continue Entresto 49/51 mg bid (unable to tolerate higher dose).  - Continue spironolactone 25 mg daily.  - Continue empagliflozin.  - Update echo next visit. 3.  OSA: Untreated, sounds severe based on his wife's description.  He has not been able to tolerate CPAP in the past and is not willing to try again. - Suspect this is a major reason why he is tired/sleepy during the day.  4. PVCs: History of PVCs, now on mexiletine. Most recent Zio patch in 3/21 with only 2% PVCs.  There was question of amiodarone lung toxicity during 5/21 admission and amiodarone was stopped, but pulmonary consult thought more likely UIP. He denies palpitations.  - Continue mexiletine.  5. ILD: Restrictive PFTs noted with CPX.  CT chest in 4/21 concerning for UIP.  I stopped amiodarone due to concern that this could represent amiodarone lung toxicity, but pulmonary thought less likely than UIP.  He saw pulmonary and decided against treatment with Esbriet. ILD appears steadily progressive, now on home oxygen 3L [place].  - He has stopped following with pulmonary, does not want ILD treatment. I encouraged him to keep Pulmonary follow up. - We have arranged for repeat high resolution CT chest to assess progression.  - We discussed Pulm Rehab today, he will think about it. 6. Fatigue: Likely multifactorial, likely OSA and ILD contributing.  - Continue oxygen at all times.  Follow up in 3-4 months with Dr. Shirlee Latch + echo  Anderson Malta [facility] FNP-BC 11/06/2023

## 2023-11-18 ENCOUNTER — Encounter: Payer: Self-pay | Admitting: Cardiology

## 2023-11-19 ENCOUNTER — Other Ambulatory Visit (HOSPITAL_COMMUNITY): Payer: Self-pay

## 2023-11-19 DIAGNOSIS — I5022 Chronic systolic (congestive) heart failure: Secondary | ICD-10-CM

## 2023-11-19 MED ORDER — REPATHA SURECLICK 140 MG/ML ~~LOC~~ SOAJ: 1.0000 | SUBCUTANEOUS | 11 refills | Status: AC

## 2023-11-20 ENCOUNTER — Ambulatory Visit (HOSPITAL_COMMUNITY)
Admission: RE | Admit: 2023-11-20 | Discharge: 2023-11-20 | Disposition: A | Discharge status: Home / Self Care | Source: Ambulatory Visit | Attending: Internal Medicine | Admitting: Internal Medicine

## 2023-11-20 DIAGNOSIS — I5022 Chronic systolic (congestive) heart failure: Secondary | ICD-10-CM | POA: Insufficient documentation

## 2023-11-20 LAB — BASIC METABOLIC PANEL WITH GFR
Anion gap: 11 (ref 5–15)
BUN: 18 mg/dL (ref 8–23)
CO2: 23 mmol/L (ref 22–32)
Calcium: 9.2 mg/dL (ref 8.9–10.3)
Chloride: 99 mmol/L (ref 98–111)
Creatinine, Ser: 1.16 mg/dL (ref 0.61–1.24)
GFR, Estimated: >60 mL/min (ref >60)
Glucose, Bld: 188 mg/dL — ABNORMAL HIGH (ref 70–99)
Potassium: 3.9 mmol/L (ref 3.5–5.1)
Sodium: 133 mmol/L — ABNORMAL LOW (ref 135–145)

## 2023-11-25 ENCOUNTER — Telehealth: Payer: Self-pay | Admitting: Pharmacy Technician

## 2023-11-25 ENCOUNTER — Other Ambulatory Visit (HOSPITAL_COMMUNITY): Payer: Self-pay

## 2023-11-25 ENCOUNTER — Telehealth (HOSPITAL_COMMUNITY): Payer: Self-pay | Admitting: Cardiology

## 2023-11-25 NOTE — Telephone Encounter (Signed)
 Pharmacy Patient Advocate Encounter   Received notification from Pt Calls Messages that prior authorization for repatha is required/requested.   Insurance verification completed.   The patient is insured through [facility] ADVANTAGE/RX ADVANCE .   Per test claim: PA required; PA submitted to above mentioned insurance via CoverMyMeds Key/confirmation #/EOC [id number] Status is pending

## 2023-11-25 NOTE — Addendum Note (Signed)
 Addended by: Geralyn Flash D on: 11/25/2023 12:56 PM   Modules accepted: Orders

## 2023-11-25 NOTE — Telephone Encounter (Signed)
 [facility] called to report PA is needed for repatha    Message to lipid clinic/CVD pharmD

## 2023-11-25 NOTE — Progress Notes (Signed)
 Remote ICD transmission.

## 2023-11-26 ENCOUNTER — Other Ambulatory Visit (HOSPITAL_COMMUNITY): Payer: Self-pay

## 2023-11-26 NOTE — Telephone Encounter (Signed)
 Pharmacy Patient Advocate Encounter  Received notification from [facility] ADVANTAGE/RX ADVANCE that Prior Authorization for REPATHA has been APPROVED from 11/25/23 to 05/26/24. Spoke to pharmacy to process.Copay is $0.00.    PA #/Case ID/Reference #: [id number]

## 2023-12-30 ENCOUNTER — Emergency Department (HOSPITAL_COMMUNITY)
Admission: EM | Admit: 2023-12-30 | Discharge: 2023-12-30 | Disposition: A | Discharge status: Home / Self Care | Attending: Emergency Medicine | Admitting: Emergency Medicine

## 2023-12-30 ENCOUNTER — Other Ambulatory Visit: Payer: Self-pay

## 2023-12-30 ENCOUNTER — Emergency Department (HOSPITAL_COMMUNITY)

## 2023-12-30 DIAGNOSIS — R058 Other specified cough: Secondary | ICD-10-CM | POA: Insufficient documentation

## 2023-12-30 DIAGNOSIS — Z7982 Long term (current) use of aspirin: Secondary | ICD-10-CM | POA: Insufficient documentation

## 2023-12-30 DIAGNOSIS — R062 Wheezing: Secondary | ICD-10-CM | POA: Diagnosis not present

## 2023-12-30 DIAGNOSIS — I2510 Atherosclerotic heart disease of native coronary artery without angina pectoris: Secondary | ICD-10-CM | POA: Diagnosis not present

## 2023-12-30 DIAGNOSIS — I491 Atrial premature depolarization: Secondary | ICD-10-CM | POA: Diagnosis not present

## 2023-12-30 DIAGNOSIS — Z794 Long term (current) use of insulin: Secondary | ICD-10-CM | POA: Insufficient documentation

## 2023-12-30 DIAGNOSIS — R0989 Other specified symptoms and signs involving the circulatory and respiratory systems: Secondary | ICD-10-CM | POA: Diagnosis not present

## 2023-12-30 DIAGNOSIS — I509 Heart failure, unspecified: Secondary | ICD-10-CM | POA: Insufficient documentation

## 2023-12-30 DIAGNOSIS — Z95810 Presence of automatic (implantable) cardiac defibrillator: Secondary | ICD-10-CM | POA: Diagnosis not present

## 2023-12-30 DIAGNOSIS — M7989 Other specified soft tissue disorders: Secondary | ICD-10-CM | POA: Insufficient documentation

## 2023-12-30 DIAGNOSIS — Z79899 Other long term (current) drug therapy: Secondary | ICD-10-CM | POA: Diagnosis not present

## 2023-12-30 DIAGNOSIS — I499 Cardiac arrhythmia, unspecified: Secondary | ICD-10-CM | POA: Diagnosis not present

## 2023-12-30 DIAGNOSIS — R0602 Shortness of breath: Secondary | ICD-10-CM | POA: Insufficient documentation

## 2023-12-30 DIAGNOSIS — I110 Hypertensive heart disease with heart failure: Secondary | ICD-10-CM | POA: Diagnosis not present

## 2023-12-30 DIAGNOSIS — R918 Other nonspecific abnormal finding of lung field: Secondary | ICD-10-CM | POA: Diagnosis not present

## 2023-12-30 DIAGNOSIS — J849 Interstitial pulmonary disease, unspecified: Secondary | ICD-10-CM | POA: Diagnosis not present

## 2023-12-30 DIAGNOSIS — Z7902 Long term (current) use of antithrombotics/antiplatelets: Secondary | ICD-10-CM | POA: Insufficient documentation

## 2023-12-30 DIAGNOSIS — R531 Weakness: Secondary | ICD-10-CM | POA: Diagnosis not present

## 2023-12-30 LAB — COMPREHENSIVE METABOLIC PANEL WITH GFR
ALT: 30 U/L (ref 0–44)
AST: 22 U/L (ref 15–41)
Albumin: 3.9 g/dL (ref 3.5–5.0)
Alkaline Phosphatase: 53 U/L (ref 38–126)
Anion gap: 12 (ref 5–15)
BUN: 24 mg/dL — ABNORMAL HIGH (ref 8–23)
CO2: 24 mmol/L (ref 22–32)
Calcium: 9.1 mg/dL (ref 8.9–10.3)
Chloride: 101 mmol/L (ref 98–111)
Creatinine, Ser: 1.08 mg/dL (ref 0.61–1.24)
GFR, Estimated: >60 mL/min (ref >60)
Glucose, Bld: 184 mg/dL — ABNORMAL HIGH (ref 70–99)
Potassium: 3.6 mmol/L (ref 3.5–5.1)
Sodium: 137 mmol/L (ref 135–145)
Total Bilirubin: 0.7 mg/dL (ref 0.0–1.2)
Total Protein: 7.5 g/dL (ref 6.5–8.1)

## 2023-12-30 LAB — CBC WITH DIFFERENTIAL/PLATELET
Abs Immature Granulocytes: 0.03 10*3/uL (ref 0.00–0.07)
Basophils Absolute: 0.1 10*3/uL (ref 0.0–0.1)
Basophils Relative: 1 %
Eosinophils Absolute: 0.6 10*3/uL — ABNORMAL HIGH (ref 0.0–0.5)
Eosinophils Relative: 6 %
HCT: 45.5 % (ref 39.0–52.0)
Hemoglobin: 15.8 g/dL (ref 13.0–17.0)
Immature Granulocytes: 0 %
Lymphocytes Relative: 38 %
Lymphs Abs: 3.7 10*3/uL (ref 0.7–4.0)
MCH: 30.9 pg (ref 26.0–34.0)
MCHC: 34.7 g/dL (ref 30.0–36.0)
MCV: 89 fL (ref 80.0–100.0)
Monocytes Absolute: 0.9 10*3/uL (ref 0.1–1.0)
Monocytes Relative: 9 %
Neutro Abs: 4.6 10*3/uL (ref 1.7–7.7)
Neutrophils Relative %: 46 %
Platelets: 147 10*3/uL — ABNORMAL LOW (ref 150–400)
RBC: 5.11 MIL/uL (ref 4.22–5.81)
RDW: 13.1 % (ref 11.5–15.5)
WBC: 9.7 10*3/uL (ref 4.0–10.5)
nRBC: 0 % (ref 0.0–0.2)

## 2023-12-30 LAB — BRAIN NATRIURETIC PEPTIDE: B Natriuretic Peptide: 19.7 pg/mL (ref 0.0–100.0)

## 2023-12-30 MED ORDER — ALBUTEROL SULFATE (2.5 MG/3ML) 0.083% IN NEBU
2.5000 mg | INHALATION_SOLUTION | Freq: Four times a day (QID) | RESPIRATORY_TRACT | 12 refills | Status: AC | PRN
Start: 2023-12-30 — End: ?

## 2023-12-30 MED ORDER — DOXYCYCLINE HYCLATE 100 MG PO TABS: 100.0000 mg | ORAL_TABLET | Freq: Two times a day (BID) | ORAL | 0 refills | Status: AC

## 2023-12-30 MED ORDER — PREDNISONE 10 MG PO TABS: ORAL_TABLET | ORAL | 0 refills | Status: AC

## 2023-12-30 NOTE — ED Triage Notes (Signed)
 Patient BIB ems [facility] . Hx of pulmonary fibrosis. Shob for past week W/ dyspnea on exertion. 3-4L all the time. 80s O2 sats at urgent care.  Rhonchi wheezing duoneb in route. NSR W/ PVCS. 125/73 60sHR. 95% on 4L 18RR

## 2023-12-30 NOTE — Discharge Instructions (Signed)
 Take the medications as prescribed.  Continue inhaler use the nebulizer solution.  Follow-up with your primary care doctor to be rechecked.  Return to the ED for worsening symptoms

## 2023-12-30 NOTE — ED Provider Notes (Signed)
 Patient seen by Dr. Nora Beal.  Please see her note.  Patient's laboratory test are unremarkable.  Chest x-ray showed the possibility of infiltrate, versus edema versus atelectasis.  Patient states he is feeling better.  Would like to try to go home.  He feels like the nebulizer treatment helped.  Patient is requesting nebulizer solution.  There is a family member will let them borrow machine.  He does not feel like the inhaler is not as effective.  Will have pt ambulate prior to [facility], MD 12/30/23 1800

## 2023-12-30 NOTE — ED Provider Notes (Signed)
 [place] [facility] Provider Note   CSN: [id number] Arrival date & time: 12/30/23  1412     History  Chief Complaint  Patient presents with   Shortness of Breath    [name] is a 68 y.o. male.  Patient is a 68 year old male with a past medical history of CHF with ICD in place, CAD, hypertension, pulmonary fibrosis on 3 to 4 L home O2 presenting to the emergency department with shortness of breath.  Patient states he has had worsening shortness of breath for about the last week.  States that he has had swelling in his feet and ankles.  States that he has had a cough productive of thick sputum.  He denies any associated fevers or chest pain.  States that he sits up for sleep at night at baseline.  Denies any PND.  States that he was seen at urgent care today and was found to be hypoxic on his home oxygen  and is recommended to come to the ER for further evaluation.  The history is provided by the patient and a relative.  Shortness of Breath      Home Medications Prior to Admission medications   Medication Sig Start Date End Date Taking? Authorizing Provider  acetaminophen  (TYLENOL ) 500 MG tablet Take 500 mg by mouth every 6 (six) hours as needed (for headaches.).    [provider]  albuterol  (VENTOLIN  HFA) 108 (90 Base) MCG/ACT inhaler Inhale 1 puff into the lungs every 6 (six) hours as needed for wheezing or shortness of breath. 12/26/19   Regalado, Belkys A, MD  aspirin  81 MG chewable tablet Chew 1 tablet (81 mg total) by mouth daily. 01/06/19   Sanjuanita Cruz, NP  BD PEN NEEDLE NANO 2ND GEN 32G X 4 MM MISC See admin instructions. 12/20/20   [provider]  carvedilol  (COREG ) 12.5 MG tablet Take 1 tablet (12.5 mg total) by mouth 2 (two) times daily with a meal. 09/16/23   Darlis Eisenmenger, MD  CINNAMON PO Take 1,000 mg by mouth in the morning and at bedtime.    [provider]  clopidogrel  (PLAVIX ) 75 MG tablet  TAKE 1 TABLET(75 MG) BY MOUTH DAILY 01/08/23   Milford, Ferris, FNP  Coenzyme Q10 (CO Q 10) 100 MG CAPS Take 100 mg by mouth in the morning.    [provider]  empagliflozin  (JARDIANCE ) 25 MG TABS tablet Take 25 mg by mouth in the morning.    [provider]  Evolocumab  (REPATHA  SURECLICK) 140 MG/ML SOAJ Inject 140 mg into the skin every 14 (fourteen) days. 11/19/23   Darlis Eisenmenger, MD  furosemide  (LASIX ) 40 MG tablet Take 1.5 tablets (60 mg total) by mouth 2 (two) times daily. 11/06/23   Milford, Arlice Bene, FNP  Insulin  Glargine (TOUJEO  SOLOSTAR [place]) Inject 60 Units into the skin at bedtime.    [provider]  loratadine (CLARITIN) 5 MG/5ML syrup Take 5 mg by mouth daily.    [provider]  mexiletine (MEXITIL ) 150 MG capsule TAKE 1 CAPSULE(150 MG) BY MOUTH EVERY 12 HOURS 07/06/23   Darlis Eisenmenger, MD  nitroGLYCERIN  (NITROSTAT ) 0.4 MG SL tablet Place 1 tablet (0.4 mg total) under the tongue every 5 (five) minutes as needed for chest pain. 11/06/23   Milford, Arlice Bene, FNP  NOVOLOG  FLEXPEN 100 UNIT/ML FlexPen Inject 15 Units into the skin See admin instructions. Inject 5 units into the skin in the morning before breakfast  and two more times a day before meals as needed for an elevated BGL 11/05/20   [provider]  OXYGEN  Inhale into the lungs daily. 3-liters daily.    [provider]  pantoprazole  (PROTONIX ) 40 MG tablet Take 40 mg by mouth every evening.     [provider]  potassium chloride  SA (KLOR-CON  M) 20 MEQ tablet Take 1 tablet (20 mEq total) by mouth daily. 11/06/23   Milford, Arlice Bene, FNP  pramipexole  (MIRAPEX ) 1 MG tablet Take 0.5 mg by mouth in the morning, at noon, in the evening, and at bedtime.    [provider]  sacubitril -valsartan  (ENTRESTO ) 49-51 MG Take 1 tablet by mouth 2 (two) times daily. 07/10/23   Darlis Eisenmenger, MD  spironolactone  (ALDACTONE ) 25 MG tablet Take 1 tablet (25 mg total) by mouth  at bedtime. 07/10/23   Darlis Eisenmenger, MD      Allergies    Brilinta  [ticagrelor ], Metformin  and related, Amiodarone , Codeine, Lipitor [atorvastatin calcium ], and Niaspan [niacin er (antihyperlipidemic)]    Review of Systems   Review of Systems  Respiratory:  Positive for shortness of breath.     Physical Exam Updated Vital Signs BP 121/74   Pulse 69   Temp 98.5 F (36.9 C) (Oral)   Resp 17   SpO2 98%  Physical Exam Vitals and nursing note reviewed.  Constitutional:      General: He is not in acute distress.    Appearance: He is well-developed. He is obese.  HENT:     Head: Normocephalic and atraumatic.     Mouth/Throat:     Mouth: Mucous membranes are moist.  Eyes:     Extraocular Movements: Extraocular movements intact.  Neck:     Vascular: No JVD.  Cardiovascular:     Rate and Rhythm: Normal rate and regular rhythm.     Heart sounds: Normal heart sounds.  Pulmonary:     Effort: Pulmonary effort is normal.     Breath sounds: Examination of the right-lower field reveals rales. Examination of the left-lower field reveals rales. Rales present.  Abdominal:     Palpations: Abdomen is soft.     Tenderness: There is no abdominal tenderness.  Musculoskeletal:        General: Normal range of motion.     Cervical back: Normal range of motion and neck supple.     Right lower leg: Edema (1+ to ankles) present.     Left lower leg: Edema (1+ to ankles) present.  Skin:    General: Skin is warm and dry.  Neurological:     General: No focal deficit present.     Mental Status: He is alert and oriented to person, place, and time.  Psychiatric:        Mood and Affect: Mood normal.        Behavior: Behavior normal.     ED Results / Procedures / Treatments   Labs (all labs ordered are listed, but only abnormal results are displayed) Labs Reviewed  CBC WITH DIFFERENTIAL/PLATELET - Abnormal; Notable for the following components:      Result Value   Platelets 147 (*)     Eosinophils Absolute 0.6 (*)    All other components within normal limits  COMPREHENSIVE METABOLIC PANEL WITH GFR  BRAIN NATRIURETIC PEPTIDE    EKG EKG Interpretation Date/Time:  Wednesday Dec 30 2023 14:09:59 EDT Ventricular Rate:  70 PR Interval:  217 QRS Duration:  157 QT Interval:  446 QTC Calculation:  482 R Axis:   -79  Text Interpretation: Sinus rhythm Borderline prolonged PR interval RBBB and LAFB No significant change since last tracing Confirmed by Celesta Coke ([id number]) on 12/30/2023 3:26:16 PM  Radiology DG Chest 2 View Result Date: 12/30/2023 CLINICAL DATA:  Shortness of breath. EXAM: CHEST - 2 VIEW COMPARISON:  01/13/2022. FINDINGS: Stable cardiomegaly. Left subclavian single lead AICD in place. Low lung volumes. Chronic interstitial lung disease is again noted with streaky bibasilar opacities, which could reflect superimposed atelectasis, infiltrate, or edema. Similar right basilar pleural thickening. No large pleural effusion. No pneumothorax. No acute osseous abnormality. IMPRESSION: 1. Chronic interstitial lung disease with streaky bibasilar opacities, which could reflect superimposed atelectasis, infiltrate, or edema. 2. Stable cardiomegaly. Electronically Signed   By: Mannie Seek M.D.   On: 12/30/2023 15:16    Procedures Procedures    Medications Ordered in ED Medications - No data to display  ED Course/ Medical Decision Making/ A&P Clinical Course as of 12/30/23 1558  Wed Dec 30, 2023  1558 Patient signed out to Dr. Monnie Anthony pending labs and reassessment for disposition. [VK]    Clinical Course User Index [VK] Kingsley, Jeslynn Hollander K, DO                                 Medical Decision Making This patient presents to the ED with chief complaint(s) of SOB with pertinent past medical history of CHF with ICD in place, CAD, pulmonary fibrosis on 3-4 L home O2 which further complicates the presenting complaint. The complaint involves an extensive differential  diagnosis and also carries with it a high risk of complications and morbidity.    The differential diagnosis includes ACS, arrhythmia, anemia, pneumonia, pneumothorax, pulmonary edema, pleural effusion, no fevers or congestion making viral syndrome less likely  Additional history obtained: Additional history obtained from family Records reviewed outpatient cardiology records  ED Course and Reassessment: On patient's arrival he is hemodynamically stable in no acute distress, satting well on his home O2.  Does have crackles at the bases concerning for possible CHF with his edema in his legs.  Patient will have chest x-ray and labs including BNP and EKG performed.  He will be closely reassessed.  Independent labs interpretation:  The following labs were independently interpreted: pending  Independent visualization of imaging: - I independently visualized the following imaging with scope of interpretation limited to determining acute life threatening conditions related to emergency care: CXR, which revealed pulm fibrosis, possible pulm edema    Amount and/or Complexity of Data Reviewed Labs: ordered. Radiology: ordered.          Final Clinical Impression(s) / ED Diagnoses Final diagnoses:  None    Rx / DC Orders ED Discharge Orders     None         Kingsley, Madylin Fairbank K, DO 12/30/23 1558

## 2023-12-31 ENCOUNTER — Telehealth (HOSPITAL_COMMUNITY): Payer: Self-pay

## 2023-12-31 MED ORDER — CLOPIDOGREL BISULFATE 75 MG PO TABS: 75.0000 mg | ORAL_TABLET | Freq: Every day | ORAL | 3 refills | Status: AC

## 2023-12-31 NOTE — Telephone Encounter (Signed)
 Refill sent.

## 2024-01-19 ENCOUNTER — Ambulatory Visit (INDEPENDENT_AMBULATORY_CARE_PROVIDER_SITE_OTHER): Payer: Medicare Other

## 2024-01-19 DIAGNOSIS — I255 Ischemic cardiomyopathy: Secondary | ICD-10-CM | POA: Diagnosis not present

## 2024-01-19 LAB — CUP PACEART REMOTE DEVICE CHECK
Battery Remaining Longevity: 80 mo
Battery Voltage: 2.95 V
Brady Statistic RV Percent Paced: 0.03 %
Date Time Interrogation Session: 20250603002204
HighPow Impedance: 87 Ohm
Implantable Lead Connection Status: 753985
Implantable Lead Implant Date: 20191202
Implantable Lead Location: 753860
Implantable Pulse Generator Implant Date: 20191202
Lead Channel Impedance Value: 323 Ohm
Lead Channel Impedance Value: 380 Ohm
Lead Channel Pacing Threshold Amplitude: 0.75 V
Lead Channel Pacing Threshold Pulse Width: 0.4 ms
Lead Channel Sensing Intrinsic Amplitude: 3.125 mV
Lead Channel Sensing Intrinsic Amplitude: 3.125 mV
Lead Channel Setting Pacing Amplitude: 2.5 V
Lead Channel Setting Pacing Pulse Width: 0.4 ms
Lead Channel Setting Sensing Sensitivity: 0.3 mV
Zone Setting Status: 755011
Zone Setting Status: 755011

## 2024-01-27 ENCOUNTER — Ambulatory Visit: Payer: Self-pay | Admitting: Cardiology

## 2024-02-12 DIAGNOSIS — Z9181 History of falling: Secondary | ICD-10-CM | POA: Diagnosis not present

## 2024-02-12 DIAGNOSIS — I252 Old myocardial infarction: Secondary | ICD-10-CM | POA: Diagnosis not present

## 2024-02-12 DIAGNOSIS — E559 Vitamin D deficiency, unspecified: Secondary | ICD-10-CM | POA: Diagnosis not present

## 2024-02-12 DIAGNOSIS — E66811 Obesity, class 1: Secondary | ICD-10-CM | POA: Diagnosis not present

## 2024-02-12 DIAGNOSIS — E119 Type 2 diabetes mellitus without complications: Secondary | ICD-10-CM | POA: Diagnosis not present

## 2024-02-12 DIAGNOSIS — Z1331 Encounter for screening for depression: Secondary | ICD-10-CM | POA: Diagnosis not present

## 2024-02-12 DIAGNOSIS — Z139 Encounter for screening, unspecified: Secondary | ICD-10-CM | POA: Diagnosis not present

## 2024-02-12 DIAGNOSIS — E785 Hyperlipidemia, unspecified: Secondary | ICD-10-CM | POA: Diagnosis not present

## 2024-02-12 DIAGNOSIS — I2510 Atherosclerotic heart disease of native coronary artery without angina pectoris: Secondary | ICD-10-CM | POA: Diagnosis not present

## 2024-02-12 DIAGNOSIS — Z9981 Dependence on supplemental oxygen: Secondary | ICD-10-CM | POA: Diagnosis not present

## 2024-02-12 DIAGNOSIS — J61 Pneumoconiosis due to asbestos and other mineral fibers: Secondary | ICD-10-CM | POA: Diagnosis not present

## 2024-02-12 DIAGNOSIS — I10 Essential (primary) hypertension: Secondary | ICD-10-CM | POA: Diagnosis not present

## 2024-03-08 DIAGNOSIS — E559 Vitamin D deficiency, unspecified: Secondary | ICD-10-CM | POA: Diagnosis not present

## 2024-03-08 DIAGNOSIS — E119 Type 2 diabetes mellitus without complications: Secondary | ICD-10-CM | POA: Diagnosis not present

## 2024-03-08 DIAGNOSIS — E785 Hyperlipidemia, unspecified: Secondary | ICD-10-CM | POA: Diagnosis not present

## 2024-03-08 DIAGNOSIS — I10 Essential (primary) hypertension: Secondary | ICD-10-CM | POA: Diagnosis not present

## 2024-03-08 DIAGNOSIS — J410 Simple chronic bronchitis: Secondary | ICD-10-CM | POA: Diagnosis not present

## 2024-03-17 DIAGNOSIS — J9611 Chronic respiratory failure with hypoxia: Secondary | ICD-10-CM | POA: Diagnosis not present

## 2024-03-17 DIAGNOSIS — G4733 Obstructive sleep apnea (adult) (pediatric): Secondary | ICD-10-CM | POA: Diagnosis not present

## 2024-03-17 DIAGNOSIS — J4540 Moderate persistent asthma, uncomplicated: Secondary | ICD-10-CM | POA: Diagnosis not present

## 2024-03-17 DIAGNOSIS — J8410 Pulmonary fibrosis, unspecified: Secondary | ICD-10-CM | POA: Diagnosis not present

## 2024-03-17 NOTE — Progress Notes (Signed)
 Remote ICD transmission.

## 2024-03-18 DIAGNOSIS — Z9981 Dependence on supplemental oxygen: Secondary | ICD-10-CM | POA: Diagnosis not present

## 2024-03-18 DIAGNOSIS — I10 Essential (primary) hypertension: Secondary | ICD-10-CM | POA: Diagnosis not present

## 2024-03-18 DIAGNOSIS — I2510 Atherosclerotic heart disease of native coronary artery without angina pectoris: Secondary | ICD-10-CM | POA: Diagnosis not present

## 2024-03-18 DIAGNOSIS — I252 Old myocardial infarction: Secondary | ICD-10-CM | POA: Diagnosis not present

## 2024-03-18 DIAGNOSIS — I5020 Unspecified systolic (congestive) heart failure: Secondary | ICD-10-CM | POA: Diagnosis not present

## 2024-03-18 DIAGNOSIS — E119 Type 2 diabetes mellitus without complications: Secondary | ICD-10-CM | POA: Diagnosis not present

## 2024-03-18 DIAGNOSIS — E559 Vitamin D deficiency, unspecified: Secondary | ICD-10-CM | POA: Diagnosis not present

## 2024-03-18 DIAGNOSIS — Z125 Encounter for screening for malignant neoplasm of prostate: Secondary | ICD-10-CM | POA: Diagnosis not present

## 2024-03-23 DIAGNOSIS — G4733 Obstructive sleep apnea (adult) (pediatric): Secondary | ICD-10-CM | POA: Diagnosis not present

## 2024-03-28 DIAGNOSIS — J8410 Pulmonary fibrosis, unspecified: Secondary | ICD-10-CM | POA: Diagnosis not present

## 2024-04-01 DIAGNOSIS — J8410 Pulmonary fibrosis, unspecified: Secondary | ICD-10-CM | POA: Diagnosis not present

## 2024-04-18 DIAGNOSIS — I5020 Unspecified systolic (congestive) heart failure: Secondary | ICD-10-CM | POA: Diagnosis not present

## 2024-04-19 ENCOUNTER — Ambulatory Visit (INDEPENDENT_AMBULATORY_CARE_PROVIDER_SITE_OTHER): Payer: Medicare Other

## 2024-04-19 DIAGNOSIS — I255 Ischemic cardiomyopathy: Secondary | ICD-10-CM | POA: Diagnosis not present

## 2024-04-21 LAB — CUP PACEART REMOTE DEVICE CHECK
Battery Remaining Longevity: 76 mo
Battery Voltage: 2.94 V
Brady Statistic RV Percent Paced: 0.07 %
Date Time Interrogation Session: 20250902093525
HighPow Impedance: 82 Ohm
Implantable Lead Connection Status: 753985
Implantable Lead Implant Date: 20191202
Implantable Lead Location: 753860
Implantable Pulse Generator Implant Date: 20191202
Lead Channel Impedance Value: 285 Ohm
Lead Channel Impedance Value: 342 Ohm
Lead Channel Pacing Threshold Amplitude: 0.875 V
Lead Channel Pacing Threshold Pulse Width: 0.4 ms
Lead Channel Sensing Intrinsic Amplitude: 3.25 mV
Lead Channel Sensing Intrinsic Amplitude: 3.25 mV
Lead Channel Setting Pacing Amplitude: 2.5 V
Lead Channel Setting Pacing Pulse Width: 0.4 ms
Lead Channel Setting Sensing Sensitivity: 0.3 mV
Zone Setting Status: 755011
Zone Setting Status: 755011

## 2024-04-22 ENCOUNTER — Ambulatory Visit: Payer: Self-pay | Admitting: Cardiology

## 2024-04-25 ENCOUNTER — Other Ambulatory Visit (HOSPITAL_COMMUNITY): Payer: Self-pay

## 2024-04-25 DIAGNOSIS — I509 Heart failure, unspecified: Secondary | ICD-10-CM

## 2024-04-25 MED ORDER — SPIRONOLACTONE 25 MG PO TABS: 25.0000 mg | ORAL_TABLET | Freq: Every evening | ORAL | Status: DC

## 2024-04-26 MED ORDER — SPIRONOLACTONE 25 MG PO TABS: 25.0000 mg | ORAL_TABLET | Freq: Every evening | ORAL | 11 refills | Status: AC

## 2024-04-26 NOTE — Progress Notes (Signed)
Remote ICD Transmission.

## 2024-04-26 NOTE — Addendum Note (Signed)
 Addended by: Pallas Wahlert, DALTON HERO on: 04/26/2024 01:29 PM   Modules accepted: Orders

## 2024-05-03 DIAGNOSIS — J479 Bronchiectasis, uncomplicated: Secondary | ICD-10-CM | POA: Diagnosis not present

## 2024-05-03 DIAGNOSIS — G4733 Obstructive sleep apnea (adult) (pediatric): Secondary | ICD-10-CM | POA: Diagnosis not present

## 2024-05-03 DIAGNOSIS — J4540 Moderate persistent asthma, uncomplicated: Secondary | ICD-10-CM | POA: Diagnosis not present

## 2024-05-03 DIAGNOSIS — J9611 Chronic respiratory failure with hypoxia: Secondary | ICD-10-CM | POA: Diagnosis not present

## 2024-05-18 DIAGNOSIS — I5020 Unspecified systolic (congestive) heart failure: Secondary | ICD-10-CM | POA: Diagnosis not present

## 2024-05-24 DIAGNOSIS — G4733 Obstructive sleep apnea (adult) (pediatric): Secondary | ICD-10-CM | POA: Diagnosis not present

## 2024-06-08 DIAGNOSIS — J9611 Chronic respiratory failure with hypoxia: Secondary | ICD-10-CM | POA: Diagnosis not present

## 2024-06-08 DIAGNOSIS — J8410 Pulmonary fibrosis, unspecified: Secondary | ICD-10-CM | POA: Diagnosis not present

## 2024-06-08 DIAGNOSIS — J479 Bronchiectasis, uncomplicated: Secondary | ICD-10-CM | POA: Diagnosis not present

## 2024-06-08 DIAGNOSIS — G4733 Obstructive sleep apnea (adult) (pediatric): Secondary | ICD-10-CM | POA: Diagnosis not present

## 2024-06-08 DIAGNOSIS — J4540 Moderate persistent asthma, uncomplicated: Secondary | ICD-10-CM | POA: Diagnosis not present

## 2024-06-20 ENCOUNTER — Telehealth: Payer: Self-pay | Admitting: Pharmacy Technician

## 2024-06-20 ENCOUNTER — Other Ambulatory Visit (HOSPITAL_COMMUNITY): Payer: Self-pay

## 2024-06-20 NOTE — Telephone Encounter (Signed)
 Pharmacy Patient Advocate Encounter  Received notification from [employer] that Prior Authorization for repatha  has been APPROVED from 06/20/24 to 06/20/25. Ran test claim, Copay is $0.00- one month. This test claim was processed through [facility]- copay amounts may vary at other pharmacies due to pharmacy/plan contracts, or as the patient moves through the different stages of their insurance plan.   PA #/Case ID/Reference #: [id number]

## 2024-06-20 NOTE — Telephone Encounter (Signed)
 Pharmacy Patient Advocate Encounter   Received notification from CoverMyMeds that prior authorization for repatha  is required/requested.   Insurance verification completed.   The patient is insured through [facility] ADVANTAGE/RX ADVANCE.   Per test claim: PA required; PA submitted to above mentioned insurance via Latent Key/confirmation #/EOC [id number] Status is pending

## 2024-06-28 DIAGNOSIS — Z9981 Dependence on supplemental oxygen: Secondary | ICD-10-CM | POA: Diagnosis not present

## 2024-06-28 DIAGNOSIS — E1169 Type 2 diabetes mellitus with other specified complication: Secondary | ICD-10-CM | POA: Diagnosis not present

## 2024-06-28 DIAGNOSIS — Z125 Encounter for screening for malignant neoplasm of prostate: Secondary | ICD-10-CM | POA: Diagnosis not present

## 2024-06-28 DIAGNOSIS — I5022 Chronic systolic (congestive) heart failure: Secondary | ICD-10-CM | POA: Diagnosis not present

## 2024-06-28 DIAGNOSIS — J9611 Chronic respiratory failure with hypoxia: Secondary | ICD-10-CM | POA: Diagnosis not present

## 2024-07-06 ENCOUNTER — Other Ambulatory Visit (HOSPITAL_COMMUNITY): Payer: Self-pay | Admitting: Cardiology

## 2024-07-06 DIAGNOSIS — I5022 Chronic systolic (congestive) heart failure: Secondary | ICD-10-CM

## 2024-07-18 DIAGNOSIS — I5020 Unspecified systolic (congestive) heart failure: Secondary | ICD-10-CM | POA: Diagnosis not present

## 2024-07-19 ENCOUNTER — Ambulatory Visit: Payer: Medicare Other

## 2024-07-19 DIAGNOSIS — I5022 Chronic systolic (congestive) heart failure: Secondary | ICD-10-CM

## 2024-07-20 LAB — CUP PACEART REMOTE DEVICE CHECK
Battery Remaining Longevity: 73 mo
Battery Voltage: 2.94 V
Brady Statistic RV Percent Paced: 0.03 %
Date Time Interrogation Session: 20251202001805
HighPow Impedance: 83 Ohm
Implantable Lead Connection Status: 753985
Implantable Lead Implant Date: 20191202
Implantable Lead Location: 753860
Implantable Pulse Generator Implant Date: 20191202
Lead Channel Impedance Value: 285 Ohm
Lead Channel Impedance Value: 342 Ohm
Lead Channel Pacing Threshold Amplitude: 0.75 V
Lead Channel Pacing Threshold Pulse Width: 0.4 ms
Lead Channel Sensing Intrinsic Amplitude: 3.125 mV
Lead Channel Sensing Intrinsic Amplitude: 3.125 mV
Lead Channel Setting Pacing Amplitude: 2.5 V
Lead Channel Setting Pacing Pulse Width: 0.4 ms
Lead Channel Setting Sensing Sensitivity: 0.3 mV
Zone Setting Status: 755011
Zone Setting Status: 755011

## 2024-07-22 ENCOUNTER — Ambulatory Visit: Payer: Self-pay | Admitting: Cardiology

## 2024-07-22 NOTE — Progress Notes (Signed)
 Remote ICD Transmission

## 2024-07-29 ENCOUNTER — Other Ambulatory Visit (HOSPITAL_COMMUNITY): Payer: Self-pay | Admitting: Cardiology

## 2024-07-29 MED ORDER — SACUBITRIL-VALSARTAN 49-51 MG PO TABS: 1.0000 | ORAL_TABLET | Freq: Two times a day (BID) | ORAL | 11 refills | Status: AC

## 2024-08-19 ENCOUNTER — Other Ambulatory Visit (HOSPITAL_COMMUNITY): Payer: Self-pay

## 2024-08-19 ENCOUNTER — Telehealth (HOSPITAL_COMMUNITY): Payer: Self-pay

## 2024-08-19 NOTE — Telephone Encounter (Signed)
 Advanced Heart Failure Patient Advocate Encounter  Review of patient chart for 2026 coverage shows that Sacubitril -Valsartan  is $0 for 90 day supply. Unable to confirm copay for Repatha  [employer] at this time as the refill is too soon. Left patient a message to contact me if medication assistance is needed going forward.  Rachel DEL, CPhT Rx Patient Advocate Phone: [phone]

## 2024-08-21 NOTE — Progress Notes (Unsigned)
" °  Electrophysiology Office Note:   Date:  08/22/2024  ID:  [name], DOB 1956/04/27, MRN [id number]  Primary Cardiologist: Lynwood Schilling, MD Primary Heart Failure: None Electrophysiologist: Ritu Gagliardo Gladis Norton, MD      History of Present Illness:   [name] is a 69 y.o. male with h/o chronic systolic heart failure due to ischemic cardiomyopathy, coronary disease, hyperlipidemia, sleep apnea, diabetes seen today for routine electrophysiology followup.   Since last being seen in our clinic the patient reports doing well.  He has no chest pain or shortness of breath.  Today is a good day for him.  He is continue to have evaluation for his lung disease.  He has no cardiac complaints at this time.  he denies chest pain, palpitations, dyspnea, PND, orthopnea, nausea, vomiting, dizziness, syncope, edema, weight gain, or early satiety.   Review of systems complete and found to be negative unless listed in HPI.      EP Information / Studies Reviewed:    EKG is ordered today. Personal review as below.  EKG Interpretation Date/Time:  Monday August 22 2024 14:21:40 EST Ventricular Rate:  69 PR Interval:  216 QRS Duration:  138 QT Interval:  420 QTC Calculation: 450 R Axis:   257  Text Interpretation: Sinus rhythm with 1st degree A-V block with occasional Premature ventricular complexes Right bundle branch block Abnormal ECG No significant change since last tracing Confirmed by Mingo Siegert ([id number]) on 08/22/2024 2:24:10 PM   ICD Interrogation-  reviewed in detail today,  See PACEART report.  Device History: Medtronic Single Chamber ICD implanted 09/29/2017 for chronic systolic heart failure History of appropriate therapy: Yes History of AAD therapy: Yes; currently on mexiletine   Risk Assessment/Calculations:            Physical Exam:   VS:  BP 120/64 (BP Location: Left Arm, Patient Position: Sitting, Cuff Size: Large)   Pulse 69   Ht 5' 8 (1.727 m)   Wt 202 lb (91.6  kg)   SpO2 97%   BMI 30.71 kg/m    Wt Readings from Last 3 Encounters:  08/22/24 202 lb (91.6 kg)  11/06/23 212 lb 6.4 oz (96.3 kg)  07/27/23 214 lb 6.4 oz (97.3 kg)     GEN: Well nourished, well developed in no acute distress NECK: No JVD; No carotid bruits CARDIAC: Regular rate and rhythm, no murmurs, rubs, gallops RESPIRATORY:  Clear to auscultation without rales, wheezing or rhonchi  ABDOMEN: Soft, non-tender, non-distended EXTREMITIES:  No edema; No deformity   ASSESSMENT AND PLAN:    Chronic systolic dysfunction s/p Medtronic single chamber ICD  euvolemic today Stable on an appropriate medical regimen Normal ICD function See Pace Art report No changes today  2.  Coronary disease: Post multiple stents.  No current chest pain.  Plan per primary cardiology.  3.  Ventricular tachycardia, mexiletine.  Has already been attempting to avoid amiodarone .  No obvious recurrence.  4.  Hypertension: Well-controlled  Disposition:   Follow up with EP Team in 12 months   Signed, Bonnye Halle Gladis Norton, MD  "

## 2024-08-22 ENCOUNTER — Ambulatory Visit: Payer: Self-pay | Admitting: Cardiology

## 2024-08-22 ENCOUNTER — Encounter: Payer: Self-pay | Admitting: Cardiology

## 2024-08-22 ENCOUNTER — Ambulatory Visit: Attending: Cardiology | Admitting: Cardiology

## 2024-08-22 VITALS — BP 120/64 | HR 69 | Ht 68.0 in | Wt 202.0 lb

## 2024-08-22 DIAGNOSIS — I2510 Atherosclerotic heart disease of native coronary artery without angina pectoris: Secondary | ICD-10-CM

## 2024-08-22 DIAGNOSIS — I493 Ventricular premature depolarization: Secondary | ICD-10-CM

## 2024-08-22 DIAGNOSIS — I5022 Chronic systolic (congestive) heart failure: Secondary | ICD-10-CM | POA: Diagnosis not present

## 2024-08-22 DIAGNOSIS — I472 Ventricular tachycardia, unspecified: Secondary | ICD-10-CM

## 2024-08-22 DIAGNOSIS — I255 Ischemic cardiomyopathy: Secondary | ICD-10-CM

## 2024-08-22 LAB — CUP PACEART INCLINIC DEVICE CHECK
Date Time Interrogation Session: 20260105155036
Implantable Lead Connection Status: 753985
Implantable Lead Implant Date: 20191202
Implantable Lead Location: 753860
Implantable Pulse Generator Implant Date: 20191202

## 2024-09-28 ENCOUNTER — Ambulatory Visit (HOSPITAL_COMMUNITY)

## 2024-09-28 ENCOUNTER — Ambulatory Visit (HOSPITAL_COMMUNITY): Admitting: Cardiology
